# Patient Record
Sex: Male | Born: 1945 | Race: Black or African American | Hispanic: No | Marital: Married | Smoking: Former smoker
Health system: Southern US, Community
[De-identification: ages and names within clinical notes are randomized; demographics above are authoritative.]

## PROBLEM LIST (undated history)

## (undated) DIAGNOSIS — Z8711 Personal history of peptic ulcer disease: Secondary | ICD-10-CM

## (undated) DIAGNOSIS — R55 Syncope and collapse: Secondary | ICD-10-CM

## (undated) DIAGNOSIS — I639 Cerebral infarction, unspecified: Secondary | ICD-10-CM

## (undated) DIAGNOSIS — R7303 Prediabetes: Secondary | ICD-10-CM

## (undated) DIAGNOSIS — R011 Cardiac murmur, unspecified: Secondary | ICD-10-CM

## (undated) DIAGNOSIS — M109 Gout, unspecified: Secondary | ICD-10-CM

## (undated) DIAGNOSIS — R972 Elevated prostate specific antigen [PSA]: Secondary | ICD-10-CM

## (undated) DIAGNOSIS — R569 Unspecified convulsions: Secondary | ICD-10-CM

## (undated) DIAGNOSIS — I6529 Occlusion and stenosis of unspecified carotid artery: Secondary | ICD-10-CM

## (undated) DIAGNOSIS — E785 Hyperlipidemia, unspecified: Secondary | ICD-10-CM

## (undated) DIAGNOSIS — Z973 Presence of spectacles and contact lenses: Secondary | ICD-10-CM

## (undated) DIAGNOSIS — I10 Essential (primary) hypertension: Secondary | ICD-10-CM

## (undated) DIAGNOSIS — K219 Gastro-esophageal reflux disease without esophagitis: Secondary | ICD-10-CM

## (undated) DIAGNOSIS — W19XXXA Unspecified fall, initial encounter: Secondary | ICD-10-CM

## (undated) DIAGNOSIS — H9192 Unspecified hearing loss, left ear: Secondary | ICD-10-CM

## (undated) DIAGNOSIS — Z8719 Personal history of other diseases of the digestive system: Secondary | ICD-10-CM

## (undated) DIAGNOSIS — C61 Malignant neoplasm of prostate: Principal | ICD-10-CM

## (undated) HISTORY — PX: TONSILLECTOMY: SUR1361

## (undated) HISTORY — DX: Syncope and collapse: R55

## (undated) HISTORY — DX: Cerebral infarction, unspecified: I63.9

## (undated) HISTORY — DX: Unspecified convulsions: R56.9

## (undated) HISTORY — PX: WISDOM TOOTH EXTRACTION: SHX21

---

## 1989-03-10 HISTORY — PX: LUMBAR SPINE SURGERY: SHX701

## 1999-08-30 ENCOUNTER — Encounter: Admission: RE | Admit: 1999-08-30 | Discharge: 1999-08-30 | Payer: Self-pay | Admitting: *Deleted

## 1999-08-30 ENCOUNTER — Encounter: Payer: Self-pay | Admitting: *Deleted

## 2000-01-07 ENCOUNTER — Encounter: Admission: RE | Admit: 2000-01-07 | Discharge: 2000-01-07 | Payer: Self-pay | Admitting: Cardiology

## 2000-01-07 ENCOUNTER — Encounter: Payer: Self-pay | Admitting: Cardiology

## 2000-02-20 ENCOUNTER — Encounter: Admission: RE | Admit: 2000-02-20 | Discharge: 2000-02-20 | Payer: Self-pay | Admitting: Cardiology

## 2000-02-20 ENCOUNTER — Encounter: Payer: Self-pay | Admitting: Cardiology

## 2000-02-28 ENCOUNTER — Encounter: Payer: Self-pay | Admitting: *Deleted

## 2000-02-28 ENCOUNTER — Ambulatory Visit (HOSPITAL_COMMUNITY): Admission: RE | Admit: 2000-02-28 | Discharge: 2000-02-28 | Payer: Self-pay | Admitting: *Deleted

## 2001-04-14 ENCOUNTER — Encounter: Payer: Self-pay | Admitting: Cardiology

## 2001-04-14 ENCOUNTER — Encounter: Admission: RE | Admit: 2001-04-14 | Discharge: 2001-04-14 | Payer: Self-pay | Admitting: Cardiology

## 2001-05-10 ENCOUNTER — Encounter: Payer: Self-pay | Admitting: Cardiology

## 2001-05-10 ENCOUNTER — Ambulatory Visit (HOSPITAL_COMMUNITY): Admission: RE | Admit: 2001-05-10 | Discharge: 2001-05-10 | Payer: Self-pay | Admitting: Cardiology

## 2003-11-17 ENCOUNTER — Ambulatory Visit (HOSPITAL_COMMUNITY): Admission: RE | Admit: 2003-11-17 | Discharge: 2003-11-17 | Payer: Self-pay | Admitting: Cardiology

## 2003-12-07 ENCOUNTER — Ambulatory Visit (HOSPITAL_COMMUNITY): Admission: RE | Admit: 2003-12-07 | Discharge: 2003-12-07 | Payer: Self-pay | Admitting: Surgery

## 2003-12-13 ENCOUNTER — Ambulatory Visit (HOSPITAL_COMMUNITY): Admission: RE | Admit: 2003-12-13 | Discharge: 2003-12-13 | Payer: Self-pay | Admitting: Surgery

## 2004-03-19 ENCOUNTER — Ambulatory Visit (HOSPITAL_COMMUNITY): Admission: RE | Admit: 2004-03-19 | Discharge: 2004-03-19 | Payer: Self-pay | Admitting: Surgery

## 2007-07-05 ENCOUNTER — Encounter: Admission: RE | Admit: 2007-07-05 | Discharge: 2007-07-05 | Payer: Self-pay | Admitting: Cardiology

## 2007-07-09 ENCOUNTER — Encounter: Admission: RE | Admit: 2007-07-09 | Discharge: 2007-07-09 | Payer: Self-pay | Admitting: Cardiology

## 2009-06-04 ENCOUNTER — Encounter: Admission: RE | Admit: 2009-06-04 | Discharge: 2009-06-04 | Payer: Self-pay | Admitting: Orthopedic Surgery

## 2010-04-30 ENCOUNTER — Ambulatory Visit (HOSPITAL_COMMUNITY)
Admission: RE | Admit: 2010-04-30 | Discharge: 2010-04-30 | Disposition: A | Discharge status: Home / Self Care | Payer: Medicare Other | Source: Ambulatory Visit | Attending: Cardiology | Admitting: Cardiology

## 2010-04-30 DIAGNOSIS — I10 Essential (primary) hypertension: Secondary | ICD-10-CM | POA: Insufficient documentation

## 2010-04-30 DIAGNOSIS — I6529 Occlusion and stenosis of unspecified carotid artery: Secondary | ICD-10-CM | POA: Insufficient documentation

## 2010-04-30 DIAGNOSIS — R0989 Other specified symptoms and signs involving the circulatory and respiratory systems: Secondary | ICD-10-CM

## 2010-04-30 DIAGNOSIS — E119 Type 2 diabetes mellitus without complications: Secondary | ICD-10-CM | POA: Insufficient documentation

## 2011-06-19 ENCOUNTER — Other Ambulatory Visit: Payer: Self-pay | Admitting: Cardiology

## 2011-06-19 ENCOUNTER — Ambulatory Visit (HOSPITAL_COMMUNITY)
Admission: RE | Admit: 2011-06-19 | Discharge: 2011-06-19 | Disposition: A | Discharge status: Home / Self Care | Payer: Medicare Other | Source: Ambulatory Visit | Attending: Cardiology | Admitting: Cardiology

## 2011-06-19 ENCOUNTER — Ambulatory Visit
Admission: RE | Admit: 2011-06-19 | Discharge: 2011-06-19 | Disposition: A | Discharge status: Home / Self Care | Payer: Medicare Other | Source: Ambulatory Visit | Attending: Cardiology | Admitting: Cardiology

## 2011-06-19 DIAGNOSIS — M79609 Pain in unspecified limb: Secondary | ICD-10-CM

## 2011-06-19 DIAGNOSIS — M7989 Other specified soft tissue disorders: Secondary | ICD-10-CM

## 2011-06-19 NOTE — Progress Notes (Addendum)
VASCULAR LAB PRELIMINARY  PRELIMINARY  PRELIMINARY  PRELIMINARY  Right lower extremity venous duplex has been completed.    Preliminary report:  Right leg is negative for deep and superficial vein thrombosis.  There is a medium size Baker's cyst noted in popliteal fossa.  Report called to Dr. Shana Chute.   Vanna Scotland,  RVT 06/19/2011, 10:37 AM

## 2011-11-26 HISTORY — PX: PROSTATE BIOPSY: SHX241

## 2012-03-10 HISTORY — PX: KNEE ARTHROSCOPY W/ MENISCAL REPAIR: SHX1877

## 2012-06-22 ENCOUNTER — Other Ambulatory Visit: Payer: Self-pay | Admitting: Cardiology

## 2012-06-22 ENCOUNTER — Ambulatory Visit
Admission: RE | Admit: 2012-06-22 | Discharge: 2012-06-22 | Disposition: A | Discharge status: Home / Self Care | Payer: Medicare Other | Source: Ambulatory Visit | Attending: Cardiology | Admitting: Cardiology

## 2012-06-22 DIAGNOSIS — R42 Dizziness and giddiness: Secondary | ICD-10-CM

## 2012-12-08 ENCOUNTER — Other Ambulatory Visit: Payer: Self-pay | Admitting: Urology

## 2012-12-15 ENCOUNTER — Encounter (HOSPITAL_BASED_OUTPATIENT_CLINIC_OR_DEPARTMENT_OTHER): Payer: Self-pay | Admitting: *Deleted

## 2012-12-15 NOTE — Progress Notes (Signed)
NPO AFTER MN. ARRIVE AT 1610. NEEDS ISTAT AND EKG. WILL DO FLEET ENEMA AM DOS.

## 2012-12-15 NOTE — Progress Notes (Signed)
12/15/12 1207  OBSTRUCTIVE SLEEP APNEA  Have you ever been diagnosed with sleep apnea through a sleep study? No  Do you snore loudly (loud enough to be heard through closed doors)?  1  Do you often feel tired, fatigued, or sleepy during the daytime? 0  Has anyone observed you stop breathing during your sleep? 0  Do you have, or are you being treated for high blood pressure? 1  BMI more than 35 kg/m2? 0  Age over 69 years old? 1  Neck circumference greater than 40 cm/18 inches? 0  Gender: 1  Obstructive Sleep Apnea Score 4  Score 4 or greater  Results sent to PCP

## 2012-12-21 ENCOUNTER — Other Ambulatory Visit: Payer: Self-pay

## 2012-12-21 ENCOUNTER — Ambulatory Visit (HOSPITAL_BASED_OUTPATIENT_CLINIC_OR_DEPARTMENT_OTHER)
Admission: RE | Admit: 2012-12-21 | Discharge: 2012-12-21 | Disposition: A | Discharge status: Home / Self Care | Payer: Medicare Other | Source: Ambulatory Visit | Attending: Urology | Admitting: Urology

## 2012-12-21 ENCOUNTER — Encounter (HOSPITAL_BASED_OUTPATIENT_CLINIC_OR_DEPARTMENT_OTHER): Payer: Medicare Other | Admitting: Anesthesiology

## 2012-12-21 ENCOUNTER — Ambulatory Visit (HOSPITAL_BASED_OUTPATIENT_CLINIC_OR_DEPARTMENT_OTHER): Payer: Medicare Other | Admitting: Anesthesiology

## 2012-12-21 ENCOUNTER — Encounter (HOSPITAL_BASED_OUTPATIENT_CLINIC_OR_DEPARTMENT_OTHER): Payer: Self-pay | Admitting: Anesthesiology

## 2012-12-21 ENCOUNTER — Encounter (HOSPITAL_BASED_OUTPATIENT_CLINIC_OR_DEPARTMENT_OTHER)
Admission: RE | Disposition: A | Discharge status: Home / Self Care | Payer: Self-pay | Source: Ambulatory Visit | Attending: Urology

## 2012-12-21 DIAGNOSIS — C61 Malignant neoplasm of prostate: Secondary | ICD-10-CM

## 2012-12-21 DIAGNOSIS — I10 Essential (primary) hypertension: Secondary | ICD-10-CM | POA: Insufficient documentation

## 2012-12-21 DIAGNOSIS — M109 Gout, unspecified: Secondary | ICD-10-CM | POA: Insufficient documentation

## 2012-12-21 DIAGNOSIS — N529 Male erectile dysfunction, unspecified: Secondary | ICD-10-CM | POA: Insufficient documentation

## 2012-12-21 DIAGNOSIS — R011 Cardiac murmur, unspecified: Secondary | ICD-10-CM | POA: Insufficient documentation

## 2012-12-21 DIAGNOSIS — N139 Obstructive and reflux uropathy, unspecified: Secondary | ICD-10-CM | POA: Insufficient documentation

## 2012-12-21 DIAGNOSIS — E7800 Pure hypercholesterolemia, unspecified: Secondary | ICD-10-CM | POA: Insufficient documentation

## 2012-12-21 HISTORY — DX: Personal history of other diseases of the digestive system: Z87.19

## 2012-12-21 HISTORY — DX: Presence of spectacles and contact lenses: Z97.3

## 2012-12-21 HISTORY — DX: Occlusion and stenosis of unspecified carotid artery: I65.29

## 2012-12-21 HISTORY — DX: Cardiac murmur, unspecified: R01.1

## 2012-12-21 HISTORY — DX: Personal history of peptic ulcer disease: Z87.11

## 2012-12-21 HISTORY — DX: Essential (primary) hypertension: I10

## 2012-12-21 HISTORY — PX: PROSTATE BIOPSY: SHX241

## 2012-12-21 HISTORY — DX: Prediabetes: R73.03

## 2012-12-21 HISTORY — DX: Unspecified hearing loss, left ear: H91.92

## 2012-12-21 HISTORY — DX: Elevated prostate specific antigen (PSA): R97.20

## 2012-12-21 HISTORY — DX: Gout, unspecified: M10.9

## 2012-12-21 HISTORY — DX: Malignant neoplasm of prostate: C61

## 2012-12-21 SURGERY — BIOPSY, PROSTATE, RECTAL APPROACH, WITH US GUIDANCE
Anesthesia: General | Site: Prostate | Wound class: Clean Contaminated

## 2012-12-21 MED ORDER — PROPOFOL 10 MG/ML IV BOLUS
INTRAVENOUS | Status: DC | PRN
  Administered 2012-12-21: 200 mg via INTRAVENOUS

## 2012-12-21 MED ORDER — GLYCOPYRROLATE 0.2 MG/ML IJ SOLN
INTRAMUSCULAR | Status: DC | PRN
  Administered 2012-12-21: 0.2 mg via INTRAVENOUS

## 2012-12-21 MED ORDER — CIPROFLOXACIN IN D5W 400 MG/200ML IV SOLN
400.0000 mg | INTRAVENOUS | Status: AC
  Administered 2012-12-21: 400 mg via INTRAVENOUS
  Filled 2012-12-21: qty 200

## 2012-12-21 MED ORDER — PROMETHAZINE HCL 25 MG/ML IJ SOLN
6.2500 mg | INTRAMUSCULAR | Status: DC | PRN
  Filled 2012-12-21: qty 1

## 2012-12-21 MED ORDER — LIDOCAINE HCL (CARDIAC) 20 MG/ML IV SOLN
INTRAVENOUS | Status: DC | PRN
  Administered 2012-12-21: 60 mg via INTRAVENOUS

## 2012-12-21 MED ORDER — FENTANYL CITRATE 0.05 MG/ML IJ SOLN
INTRAMUSCULAR | Status: DC | PRN
  Administered 2012-12-21 (×2): 50 ug via INTRAVENOUS

## 2012-12-21 MED ORDER — LACTATED RINGERS IV SOLN
INTRAVENOUS | Status: DC
  Administered 2012-12-21: 09:00:00 via INTRAVENOUS
  Filled 2012-12-21: qty 1000

## 2012-12-21 MED ORDER — DEXAMETHASONE SODIUM PHOSPHATE 4 MG/ML IJ SOLN
INTRAMUSCULAR | Status: DC | PRN
  Administered 2012-12-21: 10 mg via INTRAVENOUS

## 2012-12-21 MED ORDER — FENTANYL CITRATE 0.05 MG/ML IJ SOLN
25.0000 ug | INTRAMUSCULAR | Status: DC | PRN
  Filled 2012-12-21: qty 1

## 2012-12-21 MED ORDER — ONDANSETRON HCL 4 MG/2ML IJ SOLN
INTRAMUSCULAR | Status: DC | PRN
  Administered 2012-12-21: 4 mg via INTRAMUSCULAR

## 2012-12-21 SURGICAL SUPPLY — 1 items: UNDERPAD 30X30 INCONTINENT (UNDERPADS AND DIAPERS) ×2 IMPLANT

## 2012-12-21 NOTE — Anesthesia Preprocedure Evaluation (Addendum)
Anesthesia Evaluation  Patient identified by MRN, date of birth, ID band Patient awake    Reviewed: Allergy & Precautions, H&P , NPO status , Patient's Chart, lab work & pertinent test results  Airway Mallampati: II TM Distance: >3 FB Neck ROM: Full    Dental no notable dental hx.    Pulmonary neg pulmonary ROS,  breath sounds clear to auscultation  Pulmonary exam normal       Cardiovascular Exercise Tolerance: Good hypertension, Pt. on medications + Peripheral Vascular Disease negative cardio ROS  + Valvular Problems/Murmurs Rhythm:Regular Rate:Normal  ECG reviewed. ?LVH   Neuro/Psych negative neurological ROS  negative psych ROS   GI/Hepatic negative GI ROS, Neg liver ROS,   Endo/Other  Borderline Diabetes, diet controlled.  Renal/GU negative Renal ROS  negative genitourinary   Musculoskeletal negative musculoskeletal ROS (+)   Abdominal   Peds negative pediatric ROS (+)  Hematology negative hematology ROS (+)   Anesthesia Other Findings   Reproductive/Obstetrics negative OB ROS                          Anesthesia Physical Anesthesia Plan  ASA: II  Anesthesia Plan: General   Post-op Pain Management:    Induction: Intravenous  Airway Management Planned: LMA  Additional Equipment:   Intra-op Plan:   Post-operative Plan: Extubation in OR  Informed Consent: I have reviewed the patients History and Physical, chart, labs and discussed the procedure including the risks, benefits and alternatives for the proposed anesthesia with the patient or authorized representative who has indicated his/her understanding and acceptance.   Dental advisory given  Plan Discussed with: CRNA  Anesthesia Plan Comments:         Anesthesia Quick Evaluation

## 2012-12-21 NOTE — Anesthesia Procedure Notes (Signed)
Procedure Name: LMA Insertion Date/Time: 12/21/2012 10:06 AM Performed by: Norva Pavlov Pre-anesthesia Checklist: Patient identified, Emergency Drugs available, Suction available and Patient being monitored Patient Re-evaluated:Patient Re-evaluated prior to inductionOxygen Delivery Method: Circle System Utilized Preoxygenation: Pre-oxygenation with 100% oxygen Intubation Type: IV induction Ventilation: Mask ventilation without difficulty LMA: LMA inserted LMA Size: 4.0 Number of attempts: 1 Airway Equipment and Method: bite block Placement Confirmation: positive ETCO2 Tube secured with: Tape Dental Injury: Teeth and Oropharynx as per pre-operative assessment

## 2012-12-21 NOTE — Transfer of Care (Signed)
Immediate Anesthesia Transfer of Care Note  Patient: Ricky SCHLEYER Sr.  Procedure(s) Performed: Procedure(s) (LRB): BIOPSY TRANSRECTAL ULTRASONIC PROSTATE (TUBP) (N/A)  Patient Location: PACU  Anesthesia Type: General  Level of Consciousness: awake, alert  and oriented  Airway & Oxygen Therapy: Patient Spontanous Breathing and Patient connected to face mask oxygen  Post-op Assessment: Report given to PACU RN and Post -op Vital signs reviewed and stable  Post vital signs: Reviewed and stable  Complications: No apparent anesthesia complications

## 2012-12-21 NOTE — Anesthesia Postprocedure Evaluation (Signed)
  Anesthesia Post-op Note  Patient: Ricky Rowland.  Procedure(s) Performed: Procedure(s) (LRB): BIOPSY TRANSRECTAL ULTRASONIC PROSTATE (TUBP) (N/A)  Patient Location: PACU  Anesthesia Type: General  Level of Consciousness: awake and alert   Airway and Oxygen Therapy: Patient Spontanous Breathing  Post-op Pain: mild  Post-op Assessment: Post-op Vital signs reviewed, Patient's Cardiovascular Status Stable, Respiratory Function Stable, Patent Airway and No signs of Nausea or vomiting  Last Vitals:  Filed Vitals:   12/21/12 1100  BP: 139/81  Pulse: 64  Temp:   Resp: 13    Post-op Vital Signs: stable   Complications: No apparent anesthesia complications

## 2012-12-21 NOTE — H&P (Signed)
History of Present Illness  Mr Ricky Rowland had a negative prostate biopsy in September 2013 for PSA of 4.79.  His PSA is trending up and is now 9.05.  He needs prostate biopsy.  He has nocturia x 1.  He has difficulty with erections and has not had any response with Viagra.  He does not have success with the vacuum either.  I discussed the other treatment options with him: penile injections versus penile prosthesis.  He would like to think about it.  I gave him literature about penile prosthesis for his review.   Past Medical History Problems  1. History of  Arthritis V13.4 2. History of  Gout 274.9 3. History of  Hypercholesterolemia 272.0 4. History of  Hypertension 401.9 5. History of  Murmurs 785.2  Surgical History Problems  1. History of  Back Surgery  Current Meds 1. Allopurinol 300 MG Oral Tablet; Therapy: 18Jan2013 to 2. Clobetasol Propionate 0.05 % External Solution; Therapy: 14May2014 to 3. Colcrys 0.6 MG Oral Tablet; Therapy: 24Apr2014 to 4. Exforge HCT 10-320-25 MG Oral Tablet; Therapy: 05Oct2012 to 5. Tekturna 300 MG Oral Tablet; Therapy: 04Oct2012 to 6. Viagra 100 MG Oral Tablet; UAD - USE AS DIRECTED; Therapy: 26Mar2014 to  (Evaluate:17Aug2014); Last Rx:26Mar2014  Allergies Medication  1. No Known Drug Allergies  Family History Problems  1. Fraternal history of  Diabetes Mellitus V18.0 2. Family history of  Family Health Status Number Of Children 3 children 3. Sororal history of  Heart Disease V17.49 4. Family history of  Prostate Cancer V16.42 5. Fraternal history of  Prostate Cancer V16.42  Social History Problems  1. Alcohol Use 2-3 qd 2. Caffeine Use 2 qd 3. Fraternal history of  Death In The Family Father 4. Fraternal history of  Death In The Family Mother 5. Marital History - Currently Married 6. Tobacco Use V15.82 1 ppd for 3yrs, nonsmoker for the past 51yrs  Review of Systems Genitourinary, constitutional, skin, eye, otolaryngeal,  hematologic/lymphatic, cardiovascular, pulmonary, endocrine, musculoskeletal, gastrointestinal, neurological and psychiatric system(s) were reviewed and pertinent findings if present are noted.    Vitals Vital Signs [Data Includes: Last 1 Day]  29Sep2014 01:48PM  Blood Pressure: 173 / 82 Heart Rate: 57 Respiration: 18  Physical Exam Constitutional: Well nourished and well developed . No acute distress.  ENT:. The ears and nose are normal in appearance.  Neck: The appearance of the neck is normal and no neck mass is present.  Pulmonary: No respiratory distress and normal respiratory rhythm and effort.  Cardiovascular: Heart rate and rhythm are normal . No peripheral edema.  Abdomen: The abdomen is soft and nontender. No masses are palpated. No CVA tenderness. No hernias are palpable. No hepatosplenomegaly noted.  Genitourinary: Examination of the penis demonstrates no discharge, no masses, no lesions and a normal meatus. The scrotum is without lesions. The right epididymis is palpably normal and non-tender. The left epididymis is palpably normal and non-tender. The right testis is non-tender and without masses. The left testis is non-tender and without masses.  Lymphatics: The femoral and inguinal nodes are not enlarged or tender.  Skin: Normal skin turgor, no visible rash and no visible skin lesions.  Neuro/Psych:. Mood and affect are appropriate.    Results/Data Urine [Data Includes: Last 1 Day]   29Sep2014  COLOR YELLOW   APPEARANCE CLEAR   SPECIFIC GRAVITY 1.025   pH 6.0   GLUCOSE NEG mg/dL  BILIRUBIN NEG   KETONE NEG mg/dL  BLOOD NEG   PROTEIN 161 mg/dL  UROBILINOGEN 0.2 mg/dL  NITRITE NEG   LEUKOCYTE ESTERASE NEG   SQUAMOUS EPITHELIAL/HPF FEW   WBC NONE SEEN WBC/hpf  RBC 0-2 RBC/hpf  BACTERIA NONE SEEN   CRYSTALS NONE SEEN   CASTS NONE SEEN    Assessment Assessed  1. PSA,Elevated 790.93 2. 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 Benign Prostatic  Hyperplasia Localized With Urinary Obstruction With Other Lower Urinary Tract  Symptoms 600.21  Plan Health Maintenance (V70.0)  1. UA With REFLEX  Done: 29Sep2014 01:29PM   Repeat prostate biopsy. He would like to have it under anesthesia.  ill schedule for the procedure.  The risks of the procedure include but are not limited to hemorrhage: blood in the urine, stools or semen, infection,sepsis. He understands and wishes to proceed.

## 2012-12-21 NOTE — Op Note (Signed)
ANDRAY ASSEFA Sr. is a 67 y.o.   12/21/2012  General  Pre-op diagnosis: Elevated PSA. Rule out prostate cancer  Postop diagnosis: Same  Procedure done: Ultrasound prostate biopsy  Surgeon: Wendie Simmer. Hillarie Harrigan  Ultrasound technologist: Dorcas Mcmurray  Indication: Patient is a 67 years old male who had a negative prostate biopsy in September 2013 for PSA of 4.79. PSA has been trending up and is now 9.05. He needs a repeat prostate biopsy. He prefers to have it under anesthesia. He is scheduled today for the procedure  Procedure: The patient was identified by his wrist band and proper timeout was taken.  Under general anesthesia he was prepped and draped and placed in the left lateral decubitus position. The transducer was inserted in the rectum. Ultrasound of the prostate was done. The prostate volume is  38.99 cc. The prostate height is 3.21 cm. The width is 5.32 cm and the left is 4.36 cm. There is no evidence of hypoechoic nodules.  Then 2 biopsies of the right base were done. 2 biopsies of the right mid gland and 2 biopsies of the right apex were done. Then 2 biopsies of the left base, 2 biopsies of the left mid gland and 2 biopsies of the left apex were done. There was no evidence of bleeding at the end of the procedure. The transducer was removed.  EBL: Minimal  The patient tolerated the procedure well and left the OR in satisfactory condition to postanesthesia care unit  CC: Dr. Donia Guiles

## 2012-12-22 ENCOUNTER — Encounter (HOSPITAL_BASED_OUTPATIENT_CLINIC_OR_DEPARTMENT_OTHER): Payer: Self-pay | Admitting: Urology

## 2012-12-22 LAB — POCT I-STAT, CHEM 8
Calcium, Ion: 1.16 mmol/L (ref 1.13–1.30)
Chloride: 105 mEq/L (ref 96–112)
Glucose, Bld: 120 mg/dL — ABNORMAL HIGH (ref 70–99)
HCT: 43 % (ref 39.0–52.0)
Potassium: 3.8 mEq/L (ref 3.5–5.1)
Sodium: 139 mEq/L (ref 135–145)
TCO2: 26 mmol/L (ref 0–100)

## 2012-12-23 ENCOUNTER — Other Ambulatory Visit: Payer: Self-pay

## 2012-12-23 ENCOUNTER — Encounter (HOSPITAL_COMMUNITY): Payer: Self-pay | Admitting: Emergency Medicine

## 2012-12-23 ENCOUNTER — Emergency Department (HOSPITAL_COMMUNITY)
Admission: EM | Admit: 2012-12-23 | Discharge: 2012-12-23 | Disposition: A | Discharge status: Home / Self Care | Payer: Medicare Other | Attending: Emergency Medicine | Admitting: Emergency Medicine

## 2012-12-23 DIAGNOSIS — I10 Essential (primary) hypertension: Secondary | ICD-10-CM | POA: Insufficient documentation

## 2012-12-23 DIAGNOSIS — R011 Cardiac murmur, unspecified: Secondary | ICD-10-CM | POA: Insufficient documentation

## 2012-12-23 DIAGNOSIS — Z8719 Personal history of other diseases of the digestive system: Secondary | ICD-10-CM | POA: Insufficient documentation

## 2012-12-23 DIAGNOSIS — R55 Syncope and collapse: Secondary | ICD-10-CM

## 2012-12-23 DIAGNOSIS — R110 Nausea: Secondary | ICD-10-CM

## 2012-12-23 DIAGNOSIS — Z87891 Personal history of nicotine dependence: Secondary | ICD-10-CM | POA: Insufficient documentation

## 2012-12-23 DIAGNOSIS — Z792 Long term (current) use of antibiotics: Secondary | ICD-10-CM | POA: Insufficient documentation

## 2012-12-23 DIAGNOSIS — H9190 Unspecified hearing loss, unspecified ear: Secondary | ICD-10-CM | POA: Insufficient documentation

## 2012-12-23 DIAGNOSIS — R112 Nausea with vomiting, unspecified: Secondary | ICD-10-CM | POA: Insufficient documentation

## 2012-12-23 DIAGNOSIS — M109 Gout, unspecified: Secondary | ICD-10-CM | POA: Insufficient documentation

## 2012-12-23 LAB — POCT I-STAT TROPONIN I: Troponin i, poc: 0.02 ng/mL (ref 0.00–0.08)

## 2012-12-23 LAB — CBC
HCT: 38.7 % — ABNORMAL LOW (ref 39.0–52.0)
Hemoglobin: 13.2 g/dL (ref 13.0–17.0)
MCH: 30.1 pg (ref 26.0–34.0)
MCHC: 34.1 g/dL (ref 30.0–36.0)
MCV: 88.4 fL (ref 78.0–100.0)
Platelets: 154 10*3/uL (ref 150–400)
RBC: 4.38 MIL/uL (ref 4.22–5.81)
RDW: 14.1 % (ref 11.5–15.5)
WBC: 13.6 10*3/uL — ABNORMAL HIGH (ref 4.0–10.5)

## 2012-12-23 LAB — BASIC METABOLIC PANEL
BUN: 21 mg/dL (ref 6–23)
CO2: 23 mEq/L (ref 19–32)
Chloride: 102 mEq/L (ref 96–112)
GFR calc non Af Amer: 67 mL/min — ABNORMAL LOW (ref >90)
Glucose, Bld: 103 mg/dL — ABNORMAL HIGH (ref 70–99)
Potassium: 3.9 mEq/L (ref 3.5–5.1)
Sodium: 137 mEq/L (ref 135–145)

## 2012-12-23 LAB — BASIC METABOLIC PANEL WITH GFR
Calcium: 8.6 mg/dL (ref 8.4–10.5)
Creatinine, Ser: 1.09 mg/dL (ref 0.50–1.35)
GFR calc Af Amer: 78 mL/min — ABNORMAL LOW (ref >90)

## 2012-12-23 MED ORDER — ONDANSETRON HCL 4 MG/2ML IJ SOLN: 4.0000 mg | Freq: Once | INTRAMUSCULAR | Status: DC

## 2012-12-23 MED ORDER — SODIUM CHLORIDE 0.9 % IV BOLUS (SEPSIS)
1000.0000 mL | Freq: Once | INTRAVENOUS | Status: AC
  Administered 2012-12-23: 1000 mL via INTRAVENOUS

## 2012-12-23 MED ORDER — ONDANSETRON 8 MG PO TBDP: 8.0000 mg | ORAL_TABLET | Freq: Two times a day (BID) | ORAL | Status: DC | PRN

## 2012-12-23 NOTE — ED Notes (Signed)
Per EMS Pt had sudden onset of dizziness, diaphoresis, and nausea at approximately 1120. Pt had one episode of vomiting with EMS. Denies CP, SOB or LOC. Negative for stroke symptoms. EMS gave 4mg  IV zofran. Pt had prostate biopsy two days ago. Pt reports having dizzy episodes like this before but not as severe.

## 2012-12-23 NOTE — Discharge Instructions (Signed)
 Near-Syncope Near-syncope is sudden weakness, dizziness, or feeling like you might pass out (faint). This may occur when getting up after sitting or while standing for a long period of time. Near-syncope can be caused by a drop in blood pressure. This is a common reaction, but it may occur to a greater degree in people taking medicines to control their blood pressure. Fainting often occurs when the blood pressure or pulse is too low to provide enough blood flow to the brain to keep you conscious. Fainting and near-syncope are not usually due to serious medical problems. However, certain people should be more cautious in the event of near-syncope, including elderly patients, patients with diabetes, and patients with a history of heart conditions (especially irregular rhythms).  CAUSES   Drop in blood pressure.  Physical pain.  Dehydration.  Heat exhaustion.  Emotional distress.  Low blood sugar.  Internal bleeding.  Heart and circulatory problems.  Infections. SYMPTOMS   Dizziness.  Feeling sick to your stomach (nauseous).  Nearly fainting.  Body numbness.  Turning pale.  Tunnel vision.  Weakness. HOME CARE INSTRUCTIONS   Lie down right away if you start feeling like you might faint. Breathe deeply and steadily. Wait until all the symptoms have passed. Most of these episodes last only a few minutes. You may feel tired for several hours.  Drink enough fluids to keep your urine clear or pale yellow.  If you are taking blood pressure or heart medicine, get up slowly, taking several minutes to sit and then stand. This can reduce dizziness that is caused by a drop in blood pressure. SEEK IMMEDIATE MEDICAL CARE IF:   You have a severe headache.  Unusual pain develops in the chest, abdomen, or back.  There is bleeding from the mouth or rectum, or you have black or tarry stool.  An irregular heartbeat or a very rapid pulse develops.  You have repeated fainting or  seizure-like jerking during an episode.  You faint when sitting or lying down.  You develop confusion.  You have difficulty walking.  Severe weakness develops.  Vision problems develop. MAKE SURE YOU:   Understand these instructions.  Will watch your condition.  Will get help right away if you are not doing well or get worse. Document Released: 02/24/2005 Document Revised: 05/19/2011 Document Reviewed: 04/12/2010 [facility] Patient Information 2014 [place], [place].    Nausea, Adult Nausea is the feeling that you have an upset stomach or have to vomit. Nausea by itself is not likely a serious concern, but it may be an early sign of more serious medical problems. As nausea gets worse, it can lead to vomiting. If vomiting develops, there is the risk of dehydration.  CAUSES   Viral infections.  Food poisoning.  Medicines.  Pregnancy.  Motion sickness.  Migraine headaches.  Emotional distress.  Severe pain from any source.  Alcohol intoxication. HOME CARE INSTRUCTIONS  Get plenty of rest.  Ask your caregiver about specific rehydration instructions.  Eat small amounts of food and sip liquids more often.  Take all medicines as told by your caregiver. SEEK MEDICAL CARE IF:  You have not improved after 2 days, or you get worse.  You have a headache. SEEK IMMEDIATE MEDICAL CARE IF:   You have a fever.  You faint.  You keep vomiting or have blood in your vomit.  You are extremely weak or dehydrated.  You have dark or bloody stools.  You have severe chest or abdominal pain. MAKE SURE YOU:  Understand  these instructions.  Will watch your condition.  Will get help right away if you are not doing well or get worse. Document Released: 04/03/2004 Document Revised: 11/19/2011 Document Reviewed: 11/06/2010 [facility] Patient Information 2014 [place], [place].

## 2012-12-23 NOTE — ED Notes (Signed)
MD at bedside. 

## 2012-12-23 NOTE — ED Provider Notes (Signed)
CSN: [id number]     Arrival date & time 12/23/12  1230 History   First MD Initiated Contact with Patient 12/23/12 1248     Chief Complaint  Patient presents with  . Dizziness  . Emesis   (Consider location/radiation/quality/duration/timing/severity/associated sxs/prior Treatment) HPI Comments: Pt was driving and began to quickly become light headed, sweaty, felt faint, dizzy and had emesis.  Pt has had similar symptoms in the past per family, pt usually can rest, sometimes he throws up, sometimes has a loose BM and symptoms improve.  Pt denies HA, visual loss, focal numbness or weakness, neck pain, CP, back pain, abd pain.  Pt reports his symptoms are still present but improved, no longer feels faint. Pt skipped breakfast but that is normal for him.  He was sedated and had a prostate biopsy done 2 days ago.  He laid around yesterday, rested, but was more active today, slept well in his estimation.  No meds taken PTA.  No h/o TIA or stroke, MI or CAD.    The history is provided by the patient and the spouse.    Past Medical History  Diagnosis Date  . Elevated PSA   . Hypertension   . External carotid artery stenosis     MILD-- BILATERAL  . Borderline diabetes     DIET CONTROLLED  . Gout, arthritis   . History of gastric ulcer     REMOTE HX  . Heart murmur     MILD --  ASYMPTOMATIC  . Hearing loss in left ear   . Wears glasses    Past Surgical History  Procedure Laterality Date  . Knee arthroscopy w/ meniscal repair Left JAN 2014  . Lumbar spine surgery  1991  . Prostate biopsy N/A 12/21/2012    Procedure: BIOPSY TRANSRECTAL ULTRASONIC PROSTATE (TUBP);  Surgeon: Lindaann Slough, MD;  Location: [facility];  Service: Urology;  Laterality: N/A;   History reviewed. No pertinent family history. History  Substance Use Topics  . Smoking status: Former Smoker -- 1.00 packs/day for 30 years    Types: Cigarettes    Quit date: 12/15/1992  . Smokeless tobacco: Never Used   . Alcohol Use: Yes     Comment: OCCASIONAL    Review of Systems  Respiratory: Negative for cough, chest tightness and shortness of breath.   Cardiovascular: Negative for chest pain, palpitations and leg swelling.  Gastrointestinal: Positive for nausea and vomiting. Negative for abdominal pain.  Musculoskeletal: Negative for arthralgias, myalgias, neck pain and neck stiffness.  Neurological: Positive for dizziness and light-headedness.  All other systems reviewed and are negative.    Allergies  Review of patient's allergies indicates no known allergies.  Home Medications   Current Outpatient Rx  Name  Route  Sig  Dispense  Refill  . acetaminophen (TYLENOL) 500 MG tablet   Oral   Take 500 mg by mouth every 6 (six) hours as needed for pain.         Marland Kitchen aliskiren (TEKTURNA) 300 MG tablet   Oral   Take 300 mg by mouth every evening.         Marland Kitchen allopurinol (ZYLOPRIM) 300 MG tablet   Oral   Take 300 mg by mouth every evening.         Marland Kitchen amLODipine-valsartan (EXFORGE) 5-320 MG per tablet   Oral   Take 1 tablet by mouth every evening.         . clobetasol cream (TEMOVATE) 0.05 %   Topical  Apply topically as needed.         . colchicine 0.6 MG tablet   Oral   Take 0.6 mg by mouth as needed.         . tobramycin-dexamethasone (TOBRADEX) ophthalmic solution   Both Eyes   Place 1 drop into both eyes 4 (four) times daily.         Marland Kitchen levofloxacin (LEVAQUIN) 500 MG tablet   Oral   Take 500 mg by mouth daily.         . ondansetron (ZOFRAN-ODT) 8 MG disintegrating tablet   Oral   Take 1 tablet (8 mg total) by mouth every 12 (twelve) hours as needed for nausea.   20 tablet   0    BP 146/90  Pulse 60  Temp(Src) 98.1 F (36.7 C) (Oral)  Resp 21  SpO2 98% Physical Exam  Nursing note and vitals reviewed. Constitutional: He is oriented to person, place, and time. He appears well-developed and well-nourished. No distress.  HENT:  Head: Normocephalic and  atraumatic.  Eyes: Conjunctivae and EOM are normal. No scleral icterus.  Neck: Neck supple. No JVD present.  Cardiovascular: Normal rate, regular rhythm and intact distal pulses.   No murmur heard. Pulmonary/Chest: Effort normal. No respiratory distress. He has no wheezes. He has no rales.  Abdominal: Soft. He exhibits no distension. There is no tenderness. There is no rebound and no guarding.  Musculoskeletal: He exhibits no edema and no tenderness.  Neurological: He is alert and oriented to person, place, and time. No cranial nerve deficit. He exhibits normal muscle tone. Coordination normal.  Skin: Skin is warm. He is not diaphoretic.  Psychiatric: He has a normal mood and affect.    ED Course  Procedures (including critical care time) Labs Review Labs Reviewed  CBC - Abnormal; Notable for the following:    WBC 13.6 (*)    HCT 38.7 (*)    All other components within normal limits  BASIC METABOLIC PANEL - Abnormal; Notable for the following:    Glucose, Bld 103 (*)    GFR calc non Af Amer 67 (*)    GFR calc Af Amer 78 (*)    All other components within normal limits  POCT I-STAT TROPONIN I   Imaging Review No results found.  EKG Interpretation   None      RA sat is 98% and I interpret to be normal  ECG at time 13:02 shows SR at rate 57, LAD, incomplete RBBB, no ST or T wave abn's.  Normal intervals otherwise.  Borderline ECG.  No priors.  4:55 PM Pt fels improved and resolved of nausea, no longer dizzy.  Has ambualted in the halls with no recurrene of symptoms.  Troponin is normal, no ischemia on ECG.  Pt and family ok with going home, return precautions discussed, I recommended close follow up with Dr. Shana Chute next week  MDM   1. Near syncope   2. Nausea      Pt with history consistent with near syncope associated with lightheadedness and vagal episode.  Pt remains light headed and dizzy, but symptoms are not consistent with vertigo.  Pt has a non focal neuro exam  currently.  Will monitor, check ECG, labs, troponin serially and give IVF bolus.      Gavin Pound. Kolson Chovanec, MD 12/23/12 1655

## 2012-12-23 NOTE — ED Notes (Signed)
Ambulated PT in hall way. Pt ambulated well and displayed no obvious difficulties during ambulation.

## 2012-12-27 ENCOUNTER — Other Ambulatory Visit: Payer: Self-pay | Admitting: Cardiology

## 2012-12-27 ENCOUNTER — Other Ambulatory Visit (HOSPITAL_COMMUNITY): Payer: Self-pay | Admitting: Cardiology

## 2012-12-27 DIAGNOSIS — R55 Syncope and collapse: Secondary | ICD-10-CM

## 2012-12-27 DIAGNOSIS — R112 Nausea with vomiting, unspecified: Secondary | ICD-10-CM

## 2012-12-30 ENCOUNTER — Other Ambulatory Visit: Payer: Self-pay | Admitting: Cardiology

## 2012-12-30 ENCOUNTER — Ambulatory Visit
Admission: RE | Admit: 2012-12-30 | Discharge: 2012-12-30 | Disposition: A | Discharge status: Home / Self Care | Payer: 59 | Source: Ambulatory Visit | Attending: Cardiology | Admitting: Cardiology

## 2012-12-30 DIAGNOSIS — R112 Nausea with vomiting, unspecified: Secondary | ICD-10-CM

## 2013-01-03 ENCOUNTER — Encounter (HOSPITAL_COMMUNITY): Payer: Medicare Other

## 2013-01-10 ENCOUNTER — Encounter (HOSPITAL_COMMUNITY)
Admission: RE | Admit: 2013-01-10 | Discharge: 2013-01-10 | Disposition: A | Discharge status: Home / Self Care | Payer: Medicare Other | Source: Ambulatory Visit | Attending: Cardiology | Admitting: Cardiology

## 2013-01-10 ENCOUNTER — Other Ambulatory Visit: Payer: Self-pay

## 2013-01-10 DIAGNOSIS — R55 Syncope and collapse: Secondary | ICD-10-CM

## 2013-01-10 MED ORDER — TECHNETIUM TC 99M SESTAMIBI GENERIC - CARDIOLITE
30.0000 | Freq: Once | INTRAVENOUS | Status: AC | PRN
  Administered 2013-01-10: 30 via INTRAVENOUS

## 2013-01-10 MED ORDER — TECHNETIUM TC 99M SESTAMIBI GENERIC - CARDIOLITE
10.0000 | Freq: Once | INTRAVENOUS | Status: AC | PRN
  Administered 2013-01-10: 10 via INTRAVENOUS

## 2013-02-25 ENCOUNTER — Telehealth: Payer: Self-pay | Admitting: Internal Medicine

## 2013-02-25 NOTE — Telephone Encounter (Signed)
S/W PT AND GVE NP APPT 01/05 @ 11 W/DR. MOHAMED REFERRING DR. Kevin Fenton SPRUILL DX- EVALUATE FREE KAPPA CHAIN IN URINE/PROTIENURIA WELCOME PACKET MAILED.

## 2013-02-25 NOTE — Telephone Encounter (Signed)
PATIENT REQUESTED AN APPT AFTER THE NEW YEAR

## 2013-02-25 NOTE — Telephone Encounter (Signed)
C/D 02/25/13 for appt. 03/14/13 °

## 2013-03-14 ENCOUNTER — Other Ambulatory Visit: Payer: Self-pay | Admitting: Internal Medicine

## 2013-03-14 ENCOUNTER — Telehealth: Payer: Self-pay | Admitting: Internal Medicine

## 2013-03-14 ENCOUNTER — Encounter (INDEPENDENT_AMBULATORY_CARE_PROVIDER_SITE_OTHER): Payer: Self-pay

## 2013-03-14 ENCOUNTER — Encounter: Payer: Self-pay | Admitting: Internal Medicine

## 2013-03-14 ENCOUNTER — Ambulatory Visit: Payer: Medicare Other

## 2013-03-14 ENCOUNTER — Ambulatory Visit (HOSPITAL_BASED_OUTPATIENT_CLINIC_OR_DEPARTMENT_OTHER): Payer: 59 | Admitting: Internal Medicine

## 2013-03-14 ENCOUNTER — Other Ambulatory Visit (HOSPITAL_BASED_OUTPATIENT_CLINIC_OR_DEPARTMENT_OTHER): Payer: 59

## 2013-03-14 VITALS — BP 156/79 | HR 75 | Temp 97.5°F | Resp 18 | Ht 75.0 in | Wt 235.1 lb

## 2013-03-14 DIAGNOSIS — D472 Monoclonal gammopathy: Secondary | ICD-10-CM

## 2013-03-14 DIAGNOSIS — I10 Essential (primary) hypertension: Secondary | ICD-10-CM

## 2013-03-14 DIAGNOSIS — C61 Malignant neoplasm of prostate: Secondary | ICD-10-CM

## 2013-03-14 LAB — COMPREHENSIVE METABOLIC PANEL (CC13)
ALK PHOS: 95 U/L (ref 40–150)
ALT: 31 U/L (ref 0–55)
ANION GAP: 12 meq/L — AB (ref 3–11)
AST: 28 U/L (ref 5–34)
Albumin: 3.8 g/dL (ref 3.5–5.0)
BUN: 19.8 mg/dL (ref 7.0–26.0)
CO2: 21 mEq/L — ABNORMAL LOW (ref 22–29)
CREATININE: 1.2 mg/dL (ref 0.7–1.3)
Calcium: 9.2 mg/dL (ref 8.4–10.4)
Chloride: 107 mEq/L (ref 98–109)
Glucose: 146 mg/dl — ABNORMAL HIGH (ref 70–140)
Potassium: 4 mEq/L (ref 3.5–5.1)
SODIUM: 139 meq/L (ref 136–145)
TOTAL PROTEIN: 7.5 g/dL (ref 6.4–8.3)
Total Bilirubin: 0.41 mg/dL (ref 0.20–1.20)

## 2013-03-14 LAB — CBC WITH DIFFERENTIAL/PLATELET
BASO%: 0.6 % (ref 0.0–2.0)
Basophils Absolute: 0 10*3/uL (ref 0.0–0.1)
EOS%: 1.7 % (ref 0.0–7.0)
Eosinophils Absolute: 0.1 10*3/uL (ref 0.0–0.5)
HEMATOCRIT: 39.9 % (ref 38.4–49.9)
HGB: 13.2 g/dL (ref 13.0–17.1)
LYMPH%: 30.7 % (ref 14.0–49.0)
MCH: 29.4 pg (ref 27.2–33.4)
MCHC: 33 g/dL (ref 32.0–36.0)
MCV: 89 fL (ref 79.3–98.0)
MONO#: 0.5 10*3/uL (ref 0.1–0.9)
MONO%: 7.9 % (ref 0.0–14.0)
NEUT#: 3.7 10*3/uL (ref 1.5–6.5)
NEUT%: 59.1 % (ref 39.0–75.0)
PLATELETS: 155 10*3/uL (ref 140–400)
RBC: 4.49 10*6/uL (ref 4.20–5.82)
RDW: 14.3 % (ref 11.0–14.6)
WBC: 6.3 10*3/uL (ref 4.0–10.3)
lymph#: 1.9 10*3/uL (ref 0.9–3.3)

## 2013-03-14 LAB — LACTATE DEHYDROGENASE (CC13): LDH: 239 U/L (ref 125–245)

## 2013-03-14 NOTE — Progress Notes (Signed)
[place] Telephone:([phone]   [phone]) [phone]  CONSULT NOTE  REFERRING PHYSICIAN: Dr. Awanda Mink Rowland  REASON FOR CONSULTATION:  68 years old African American male with elevated urine free kappa light chain.  HPI Ricky Rowland. is a 68 y.o. male was past medical history significant for multiple medical problems including hypertension, dyslipidemia, gout, arthritis, heart murmur, history of back surgery, borderline diabetes mellitus as well as recently diagnosed prostate adenocarcinoma followed by Dr. Janice Rowland and has an appointment with Dr. Valere Rowland on 03/24/2013 for discussion of radiotherapy treatment. His prostate biopsies of the left mid medial showed microscopic focus of prostate adenocarcinoma with Gleason score of 3+4 equal 7 involving less than 2% 1 of 1 core, the second needle biopsy of the left apex lateral also showed microscopic focus of prostate adenocarcinoma, Gleason score of 3+3 equal 6 involving less than 2% of the tissues.  The patient was seen recently by Ricky Rowland for routine physical exam and urinalysis showed elevated urine protein. This was followed by 24 hour urine protein electrophoreses. It showed elevated urine free kappa light chain 2.64 (normal 0.14-2.42). The urine immunofixation showed no monoclonal free light chains (Bence-Jones protein are detected but it showed a polyclonal increase in the free kappa and/or free lambda light chains. Because of these abnormalities Ricky Rowland kindly referred the patient to me today for further evaluation and recommendation regarding his condition.  When seen today the patient is feeling fine with no specific complaints except for a sore spot in his throat of the recent flulike symptoms. He also has occasional scrotal pain but was seen recently by Dr. Janice Rowland. The patient denied having any significant weight loss, night sweats, nausea or vomiting. He denied having any significant chest pain, shortness breath  but continues to have mild dry cough with no hemoptysis. He has no headache or visual changes. His family history significant for mother and father with hypertension, diabetes mellitus and heart disease. He has a brother who had throat cancer at age 43 and sister with colon cancer at age 49. The patient is married and has 3 children. 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 He has a history of smoking but quit 20 years ago. He drinks 2-3 alcoholic drinks every weeks. He has no history of drug abuse.   HPI  Past Medical History  Diagnosis Date  . Elevated PSA   . Hypertension   . External carotid artery stenosis     MILD-- BILATERAL  . Borderline diabetes     DIET CONTROLLED  . Gout, arthritis   . History of gastric ulcer     REMOTE HX  . Heart murmur     MILD --  ASYMPTOMATIC  . Hearing loss in left ear   . Wears glasses     Past Surgical History  Procedure Laterality Date  . Knee arthroscopy w/ meniscal repair Left JAN 2014  . Lumbar spine surgery  1991  . Prostate biopsy N/A 12/21/2012    Procedure: BIOPSY TRANSRECTAL ULTRASONIC PROSTATE (TUBP);  Surgeon: Ricky Ben, MD;  Location: [facility];  Service: Urology;  Laterality: N/A;    History reviewed. No pertinent family history.  Social History History  Substance Use Topics  . Smoking status: Former Smoker -- 1.00 packs/day for 30 years    Types: Cigarettes    Quit date: 12/15/1992  . Smokeless tobacco: Never Used  . Alcohol Use: Yes     Comment:  OCCASIONAL    No Known Allergies  Current Outpatient Prescriptions  Medication Sig Dispense Refill  . aliskiren (TEKTURNA) 300 MG tablet Take 300 mg by mouth every evening.      Marland Kitchen allopurinol (ZYLOPRIM) 300 MG tablet Take 300 mg by mouth every evening.      Marland Kitchen amLODipine-valsartan (EXFORGE) 5-320 MG per tablet Take 1 tablet by mouth every evening.      Marland Kitchen acetaminophen (TYLENOL) 500 MG tablet Take 500 mg by mouth every  6 (six) hours as needed for pain.      . clobetasol cream (TEMOVATE) 0.05 % Apply topically as needed.      . colchicine 0.6 MG tablet Take 0.6 mg by mouth as needed.      . metoprolol (LOPRESSOR) 50 MG tablet Take 50 mg by mouth daily.       No current facility-administered medications for this visit.    Review of Systems  Constitutional: negative Eyes: negative Ears, nose, mouth, throat, and face: negative Respiratory: positive for cough Cardiovascular: negative Gastrointestinal: negative Genitourinary:negative Integument/breast: negative Hematologic/lymphatic: negative Musculoskeletal:negative Neurological: negative Behavioral/Psych: negative Endocrine: negative Allergic/Immunologic: negative  Physical Exam  TGG:YIRSW, healthy, no distress, well nourished and well developed SKIN: skin color, texture, turgor are normal, no rashes or significant lesions HEAD: Normocephalic, No masses, lesions, tenderness or abnormalities EYES: normal, PERRLA EARS: External ears normal, Canals clear OROPHARYNX:no exudate, no erythema and lips, buccal mucosa, and tongue normal  NECK: supple, no adenopathy, no JVD LYMPH:  no palpable lymphadenopathy, no hepatosplenomegaly LUNGS: clear to auscultation , and palpation HEART: regular rate & rhythm and no murmurs ABDOMEN:abdomen soft, non-tender, normal bowel sounds and no masses or organomegaly BACK: Back symmetric, no curvature., No CVA tenderness EXTREMITIES:no joint deformities, effusion, or inflammation, no edema, no skin discoloration  NEURO: alert & oriented x 3 with fluent speech, no focal motor/sensory deficits  PERFORMANCE STATUS: ECOG 0  LABORATORY DATA: Lab Results  Component Value Date   WBC 6.3 03/14/2013   HGB 13.2 03/14/2013   HCT 39.9 03/14/2013   MCV 89.0 03/14/2013   PLT 155 03/14/2013      Chemistry      Component Value Date/Time   NA 139 03/14/2013 1159   NA 137 12/23/2012 1322   K 4.0 03/14/2013 1159   K 3.9 12/23/2012  1322   CL 102 12/23/2012 1322   CO2 21* 03/14/2013 1159   CO2 23 12/23/2012 1322   BUN 19.8 03/14/2013 1159   BUN 21 12/23/2012 1322   CREATININE 1.2 03/14/2013 1159   CREATININE 1.09 12/23/2012 1322      Component Value Date/Time   CALCIUM 9.2 03/14/2013 1159   CALCIUM 8.6 12/23/2012 1322   ALKPHOS 95 03/14/2013 1159   AST 28 03/14/2013 1159   ALT 31 03/14/2013 1159   BILITOT 0.41 03/14/2013 1159       RADIOGRAPHIC STUDIES: No results found.  ASSESSMENT: This is a very pleasant 68 years old [place] American male who presented today for evaluation of an elevated urine free kappa light chain with no monoclonal specificity. This is most likely nonspecific in nature.  PLAN: I have a lengthy discussion with the patient today about his condition. However several studies to rule out any monoclonal gammopathy or early signs of multiple myeloma. I order repeat CBC, comprehensive metabolic panel, LDH and myeloma panel. The patient is currently asymptomatic. I would see him back for followup visit in 3 weeks for reevaluation and discussion of his lab results. If there is any  concerning abnormalities, I may consider the patient for a bone marrow biopsy and aspirate otherwise he may continue on observation. The patient voices understanding of current disease status and treatment options and is in agreement with the current care plan.  All questions were answered. The patient knows to call the clinic with any problems, questions or concerns. We can certainly see the patient much sooner if necessary.  Thank you so much for allowing me to participate in the care of Ricky Rowland.. I will continue to follow up the patient with you and assist in his care.  I spent 35 minutes counseling the patient face to face. The total time spent in the appointment was 55 minutes.  Jahzaria Vary K. 03/14/2013, 12:41 PM

## 2013-03-14 NOTE — Progress Notes (Signed)
Checked in new pt with no financial concerns. °

## 2013-03-14 NOTE — Telephone Encounter (Signed)
GV AND PRINTED APPT SCHED AND AVS FOR PT FOR jAN 2015

## 2013-03-14 NOTE — Patient Instructions (Signed)
Followup visit in 3 weeks for evaluation and discussion of your pending lab results.

## 2013-03-15 LAB — IGG, IGA, IGM
IGA: 311 mg/dL (ref 68–379)
IGG (IMMUNOGLOBIN G), SERUM: 1070 mg/dL (ref 650–1600)
IgM, Serum: 84 mg/dL (ref 41–251)

## 2013-03-15 LAB — BETA 2 MICROGLOBULIN, SERUM: Beta-2 Microglobulin: 1.87 mg/L — ABNORMAL HIGH (ref 1.01–1.73)

## 2013-03-15 LAB — KAPPA/LAMBDA LIGHT CHAINS
KAPPA FREE LGHT CHN: 3.13 mg/dL — AB (ref 0.33–1.94)
KAPPA LAMBDA RATIO: 0.56 (ref 0.26–1.65)
Lambda Free Lght Chn: 5.58 mg/dL — ABNORMAL HIGH (ref 0.57–2.63)

## 2013-03-18 ENCOUNTER — Encounter: Payer: Self-pay | Admitting: Radiation Oncology

## 2013-03-18 NOTE — Progress Notes (Signed)
GU Location of Tumor / Histology: Adenocarcinoma of the prostte  If Prostate Cancer, Gleason Score is (3 + 4) and PSA is (9.06)  Patient presented with rising PSA  Biopsies of prostate(if applicable) revealed:  40/10/27 Diagnosis 1. Prostate, needle biopsy(ies), right base lateral - BENIGN PROSTATIC TISSUE WITH GLANDULAR ATROPHY AND ASSOCIATED CHRONIC INFLAMMATION. - NO EVIDENCE OF MALIGNANCY. 2. Prostate, needle biopsy(ies), right base medial - BENIGN PROSTATIC TISSUE WITH GLANDULAR ATROPHY AND ASSOCIATED CHRONIC INFLAMMATION. - NO EVIDENCE OF MALIGNANCY. 3. Prostate, needle biopsy(ies), right mid lateral - BENIGN PROSTATIC TISSUE WITH GLANDULAR ATROPHY AND ASSOCIATED CHRONIC INFLAMMATION. - NO EVIDENCE OF MALIGNANCY. 4. Prostate, needle biopsy(ies), right mid medial - BENIGN PROSTATIC TISSUE WITH GLANDULAR ATROPHY AND ASSOCIATED CHRONIC INFLAMMATION. - NO EVIDENCE OF MALIGNANCY. 5. Prostate, needle biopsy(ies), right apex lateral - BENIGN PROSTATIC TISSUE WITH ASSOCIATED CHRONIC INFLAMMATION AND GLANDULAR ATROPHY. - NO EVIDENCE OF MALIGNANCY. 6. Prostate, needle biopsy(ies), right apex medial - BENIGN PROSTATIC TISSUE. - NO EVIDENCE OF MALIGNANCY. 7. Prostate, needle biopsy(ies), left base lateral - BENIGN PROSTATIC TISSUE WITH ASSOCIATED CHRONIC INFLAMMATION. - NO EVIDENCE OF MALIGNANCY. 8. Prostate, needle biopsy(ies), left base medial - BENIGN PROSTATIC TISSUE WITH ASSOCIATED CHRONIC INFLAMMATION. - NO EVIDENCE OF MALIGNANCY. 9. Prostate, needle biopsy(ies), left mid lateral - BENIGN PROSTATIC TISSUE WITH ASSOCIATED CHRONIC INFLAMMATION. - NO EVIDENCE OF MALIGNANCY. 10. Prostate, needle biopsy(ies), left mid medial - MICROSCOPIC FOCUS OF PROSTATIC ADENOCARCINOMA, GLEASON'S SCORE 3+4=7, INVOLVING LESS THAN 2% OF ONE OF ONE CORE. - NO PERINEURAL INVASION IDENTIFIED. 11. Prostate, needle biopsy(ies), left apex lateral - MICROSCOPIC FOCUS OF PROSTATIC ADENOCARCINOMA,  GLEASON'S SCORE 3+3=6, INVOLVING LESS THAN 2% OF THE TISSUE. - NO PERINEURAL INVASION IDENTIFIED. 12. Prostate, needle biopsy(ies), left apex medial - BENIGN PROSTATIC TISSUE WITH GLANDULAR ATROPHY AND ASSOCIATED FIBROMUSCULAR HYPERPLASIA. - NO EVIDENCE OF MALIGNANCY.  Past/Anticipated interventions by urology, if any: Biopsy of Prostate  Past/Anticipated interventions by medical oncology, if any: None  Weight changes, if any: None  Bowel/Bladder complaints, if any: No issues  Nausea/Vomiting, if any: None  Pain issues, if any: None  SAFETY ISSUES:  Prior radiation? No  Pacemaker/ICD? No  Possible current pregnancy? No  Is the patient on methotrexate? No  Current Complaints / other details:  NOI

## 2013-03-24 ENCOUNTER — Encounter: Payer: Self-pay | Admitting: Radiation Oncology

## 2013-03-24 ENCOUNTER — Ambulatory Visit
Admission: RE | Admit: 2013-03-24 | Discharge: 2013-03-24 | Disposition: A | Discharge status: Home / Self Care | Payer: Medicare Other | Source: Ambulatory Visit | Attending: Radiation Oncology | Admitting: Radiation Oncology

## 2013-03-24 VITALS — BP 163/91 | HR 57 | Temp 98.0°F | Ht 75.0 in | Wt 240.6 lb

## 2013-03-24 DIAGNOSIS — C61 Malignant neoplasm of prostate: Secondary | ICD-10-CM

## 2013-03-24 HISTORY — DX: Malignant neoplasm of prostate: C61

## 2013-03-24 NOTE — Progress Notes (Signed)
[facility] NEW PATIENT EVALUATION  Name: Ricky KRUEGER Sr. MRN: [id number]  Date:   03/24/2013           DOB: 1945-04-28  Status: outpatient   CC: [name], MD  Lowella Bandy, MD    REFERRING PHYSICIAN: Lowella Bandy, MD   DIAGNOSIS: Stage TI C. intermediate risk adenocarcinoma prostate   HISTORY OF PRESENT ILLNESS:  Ricky Rowland. is a 68 y.o. male who is seen today through the courtesy of Dr. [name] for evaluation of his stage TI C. intermediate risk adenocarcinoma prostate. He had benign prostate biopsies in September of 2013 for an elevated PSA of 4.79. His gland volume was 37 cc. His PSA rose to 9.05 by September 2014 with repeat biopsies by Dr. [name] diagnostic for a microscopic focus of adenocarcinoma with a Gleason score 7 (3+4) from the left medial mid gland. This involved 2% of one core. He also had a microscopic focus of adenocarcinoma Gleason score 6 (3+3) involving less than 2% of one core from the left lateral apex. Prostate volume was 37 cc. He does have moderate obstructive symptomatology with an I PSS score of 16. No GI difficulty. He does have erectile dysfunction. More recently, he has been evaluated by Dr. Julien Nordmann for excess free kappa light chains in the urine which appear to be polyclonal. He is to see Dr. Earlie Server for a followup visit on January 26. Of note is that he has had some type of dermatologic condition with areas of eczema along back and hip beginning 7-8 months ago. He was seen by a dermatologist in [place] and placed on topical corticosteroids with minimal improvement. It is unclear as to whether not this is related to his urine protein.  PREVIOUS RADIATION THERAPY: No   PAST MEDICAL HISTORY:  has a past medical history of Elevated PSA; Hypertension; External carotid artery stenosis; Borderline diabetes; Gout, arthritis; History of gastric ulcer; Heart murmur; Hearing loss in left ear; Wears glasses; and Prostate  cancer (12/21/12).     PAST SURGICAL HISTORY:  Past Surgical History  Procedure Laterality Date  . Knee arthroscopy w/ meniscal repair Left JAN 2014  . Lumbar spine surgery  1991  . Prostate biopsy N/A 12/21/2012    Procedure: BIOPSY TRANSRECTAL ULTRASONIC PROSTATE (TUBP);  Surgeon: [name], MD;  Location: [facility];  Service: Urology;  Laterality: N/A;  . Prostate biopsy  11/26/11    benign     FAMILY HISTORY: Both his parents died from cardiac disease and complications of diabetes mellitus, his father at age 56 and his mother at age 57. No family history of prostate cancer.  SOCIAL HISTORY:  reports that he quit smoking about 20 years ago. His smoking use included Cigarettes. He has a 30 pack-year smoking history. He has never used smokeless tobacco. He reports that he drinks alcohol.   ALLERGIES: Review of patient's allergies indicates no known allergies.   MEDICATIONS:  Current Outpatient Prescriptions  Medication Sig Dispense Refill  . acetaminophen (TYLENOL) 500 MG tablet Take 500 mg by mouth every 6 (six) hours as needed for pain.      Marland Kitchen aliskiren (TEKTURNA) 300 MG tablet Take 300 mg by mouth every evening.      Marland Kitchen allopurinol (ZYLOPRIM) 300 MG tablet Take 300 mg by mouth every evening.      Marland Kitchen amLODipine-valsartan (EXFORGE) 5-320 MG per tablet Take 1 tablet by mouth every evening.      . benzonatate (TESSALON)  100 MG capsule Take 100 mg by mouth 3 (three) times daily as needed for cough.      . clobetasol cream (TEMOVATE) 0.05 % Apply topically as needed.      . colchicine 0.6 MG tablet Take 0.6 mg by mouth as needed.      . metoprolol (LOPRESSOR) 50 MG tablet Take 50 mg by mouth daily.       No current facility-administered medications for this encounter.     REVIEW OF SYSTEMS:  Pertinent items are noted in HPI.    PHYSICAL EXAM:  height is 6\' 3"  (1.905 m) and weight is 240 lb 9.6 oz (109.135 kg). His temperature is 98 F (36.7 C). His blood  pressure is 163/91 and his pulse is 57.   Alert and oriented 68 year old African American male appearing his stated age. Head and neck examination: Grossly unremarkable. Nodes: Without palpable cervical or supraclavicular lymphadenopathy. Chest: There are at least 2 eczematous patches along his back. Lungs are clear. Heart: Regular in rhythm. Abdomen: Without hepatomegaly. Genitalia: Unremarkable to inspection. Rectal: The prostate gland is normal in size and is without focal induration or nodularity. Extremities: Without edema.   LABORATORY DATA:  Lab Results  Component Value Date   WBC 6.3 03/14/2013   HGB 13.2 03/14/2013   HCT 39.9 03/14/2013   MCV 89.0 03/14/2013   PLT 155 03/14/2013   Lab Results  Component Value Date   NA 139 03/14/2013   K 4.0 03/14/2013   CL 102 12/23/2012   CO2 21* 03/14/2013   Lab Results  Component Value Date   ALT 31 03/14/2013   AST 28 03/14/2013   ALKPHOS 95 03/14/2013   BILITOT 0.41 03/14/2013      IMPRESSION: Stage TI C. intermediate risk adenocarcinoma prostate. I explained to the patient and his wife that his prognosis is related to his stage, PSA level, and Gleason score. His stage and PSA level are favorable while his Gleason score of 7 is of intermediate favorability. Other prognostic factors include PSA doubling time and disease volume. His doubling time is between 1 and 2 years and he has low disease volume. We discussed surgery versus close surveillance versus radiation therapy. Radiation therapy options include seed implantation alone or external beam/IMRT. Even though he has very low volume disease I and concerned about his Gleason score 7, PSA doubling time of just greater than one year, along with a PSA of 9.05 with a normal gland volume. I think it is reasonable to consider a repeat PSA along with repeat biopsies later this spring/summer. He would be a candidate for seed implantation although he does have an I PSS score of 16 which could improve with an  alpha-blocker. Ideally, I would like to see an I PSS score of less than 15. We discussed the potential acute and late toxicities of radiation therapy. His prognosis appears to be favorable. Lastly, his polyclonal light chains may be secondary to his dermatologic condition.   PLAN: The patient is interested in pursuing short-term surveillance, think that this is perfectly reasonable. I more than happy to see him in the future should he want to consider seed implantation or sternal beam/IMRT.  I spent 60 minutes minutes face to face with the patient and more than 50% of that time was spent in counseling and/or coordination of care.

## 2013-03-25 ENCOUNTER — Encounter: Payer: Self-pay | Admitting: *Deleted

## 2013-04-04 ENCOUNTER — Encounter: Payer: Self-pay | Admitting: Internal Medicine

## 2013-04-04 ENCOUNTER — Ambulatory Visit (HOSPITAL_BASED_OUTPATIENT_CLINIC_OR_DEPARTMENT_OTHER): Payer: 59 | Admitting: Internal Medicine

## 2013-04-04 ENCOUNTER — Telehealth: Payer: Self-pay | Admitting: Internal Medicine

## 2013-04-04 VITALS — BP 148/73 | HR 55 | Temp 98.1°F | Resp 18 | Ht 75.0 in | Wt 237.6 lb

## 2013-04-04 DIAGNOSIS — D472 Monoclonal gammopathy: Secondary | ICD-10-CM

## 2013-04-04 NOTE — Telephone Encounter (Signed)
gv and printed appt sched and avs for pt for Feb  °

## 2013-04-04 NOTE — Progress Notes (Signed)
    [facility] [phone]   [phone]  OFFICE PROGRESS NOTE  SPRUILL,JEROME O, MD [address] [place] [address]  DIAGNOSIS: Monoclonal gammopathy of undetermined significance (MGUS).  PRIOR THERAPY: None  CURRENT THERAPY: Observation  INTERVAL HISTORY: Ricky J Novack Sr. 67 y.o. male returns to the clinic today for followup visit accompanied by his wife. The patient is feeling fine today with no specific complaints. He denied having any significant weakness or fatigue. He continues to have arthralgias. The patient denied having any significant chest pain, shortness breath, cough or hemoptysis. He has no weight loss or night sweats. The patient had several studies performed recently for evaluation of the paraproteinemia including beta-2 microglobulin that was elevated at 1.87, free kappa light chains 3.13 under that chin 5.58 with a kappa/lambda ratio of 0.56. Quantitative immunoglobulins showed normal IgG 1070, IgA 311 and IgM 84.  MEDICAL HISTORY: Past Medical History  Diagnosis Date  . Elevated PSA   . Hypertension   . External carotid artery stenosis     MILD-- BILATERAL  . Borderline diabetes     DIET CONTROLLED  . Gout, arthritis   . History of gastric ulcer     REMOTE HX  . Heart murmur     MILD --  ASYMPTOMATIC  . Hearing loss in left ear   . Wears glasses   . Prostate cancer 12/21/12    ALLERGIES:  has No Known Allergies.  MEDICATIONS:  Current Outpatient Prescriptions  Medication Sig Dispense Refill  . acetaminophen (TYLENOL) 500 MG tablet Take 500 mg by mouth every 6 (six) hours as needed for pain.      . aliskiren (TEKTURNA) 300 MG tablet Take 300 mg by mouth every evening.      . allopurinol (ZYLOPRIM) 300 MG tablet Take 300 mg by mouth every evening.      . amLODipine-valsartan (EXFORGE) 5-320 MG per tablet Take 1 tablet by mouth every evening.      . benzonatate (TESSALON) 100 MG capsule Take 100 mg by  mouth 3 (three) times daily as needed for cough.      . clobetasol cream (TEMOVATE) 0.05 % Apply topically as needed.      . colchicine 0.6 MG tablet Take 0.6 mg by mouth as needed.      . metoprolol (LOPRESSOR) 50 MG tablet Take 50 mg by mouth daily.       No current facility-administered medications for this visit.    SURGICAL HISTORY:  Past Surgical History  Procedure Laterality Date  . Knee arthroscopy w/ meniscal repair Left JAN 2014  . Lumbar spine surgery  1991  . Prostate biopsy N/A 12/21/2012    Procedure: BIOPSY TRANSRECTAL ULTRASONIC PROSTATE (TUBP);  Surgeon: Marc-Henry Nesi, MD;  Location: [facility];  Service: Urology;  Laterality: N/A;  . Prostate biopsy  11/26/11    benign    REVIEW OF SYSTEMS:  A comprehensive review of systems was negative except for: Musculoskeletal: positive for arthralgias   PHYSICAL EXAMINATION: General appearance: alert, cooperative and no distress Head: Normocephalic, without obvious abnormality, atraumatic Neck: no adenopathy, no JVD, supple, symmetrical, trachea midline and thyroid not enlarged, symmetric, no tenderness/mass/nodules Lymph nodes: Cervical, supraclavicular, and axillary nodes normal. Resp: clear to auscultation bilaterally Back: symmetric, no curvature. ROM normal. No CVA tenderness. Cardio: regular rate and rhythm, S1, S2 normal, no murmur, click, rub or gallop GI: soft, non-tender; bowel sounds normal; no masses,  no organomegaly Extremities: extremities   normal, atraumatic, no cyanosis or edema  ECOG PERFORMANCE STATUS: 1 - Symptomatic but completely ambulatory  Blood pressure 148/73, pulse 55, temperature 98.1 F (36.7 C), temperature source Oral, resp. rate 18, height 6' 3" (1.905 m), weight 237 lb 9.6 oz (107.775 kg), SpO2 100.00%.  LABORATORY DATA: Lab Results  Component Value Date   WBC 6.3 03/14/2013   HGB 13.2 03/14/2013   HCT 39.9 03/14/2013   MCV 89.0 03/14/2013   PLT 155 03/14/2013      Chemistry       Component Value Date/Time   NA 139 03/14/2013 1159   NA 137 12/23/2012 1322   K 4.0 03/14/2013 1159   K 3.9 12/23/2012 1322   CL 102 12/23/2012 1322   CO2 21* 03/14/2013 1159   CO2 23 12/23/2012 1322   BUN 19.8 03/14/2013 1159   BUN 21 12/23/2012 1322   CREATININE 1.2 03/14/2013 1159   CREATININE 1.09 12/23/2012 1322      Component Value Date/Time   CALCIUM 9.2 03/14/2013 1159   CALCIUM 8.6 12/23/2012 1322   ALKPHOS 95 03/14/2013 1159   AST 28 03/14/2013 1159   ALT 31 03/14/2013 1159   BILITOT 0.41 03/14/2013 1159       RADIOGRAPHIC STUDIES: No results found.  ASSESSMENT AND PLAN: This is a very pleasant 67 years old African American male with paraproteinemia questionable for monoclonal gammopathy of undetermined significance (MGUS). I have a lengthy discussion with the patient and his wife today about his condition. I gave the patient the option of continuous observation and close monitoring with repeat myeloma panel versus proceeding with a bone marrow biopsy and aspirate to rule out underlying multiple myeloma. The patient would like to proceed with the biopsy. I would see him back for followup visit in one month's for reevaluation and discussion of his biopsy results and recommendation about any further treatment. He was advised to call immediately if he has any concerning symptoms in the interval.  The patient voices understanding of current disease status and treatment options and is in agreement with the current care plan.  All questions were answered. The patient knows to call the clinic with any problems, questions or concerns. We can certainly see the patient much sooner if necessary.  Disclaimer: This note was dictated with voice recognition software. Similar sounding words can inadvertently be transcribed and may not be corrected upon review.       

## 2013-04-04 NOTE — Patient Instructions (Signed)
Followup visit in one month after repeating a bone marrow biopsy and aspirate.

## 2013-04-06 ENCOUNTER — Other Ambulatory Visit: Payer: Self-pay | Admitting: Radiology

## 2013-04-11 ENCOUNTER — Other Ambulatory Visit: Payer: Self-pay | Admitting: Radiology

## 2013-04-12 ENCOUNTER — Encounter (HOSPITAL_COMMUNITY): Payer: Self-pay | Admitting: Pharmacy Technician

## 2013-04-13 ENCOUNTER — Ambulatory Visit (HOSPITAL_COMMUNITY)
Admission: RE | Admit: 2013-04-13 | Discharge: 2013-04-13 | Disposition: A | Discharge status: Home / Self Care | Payer: Medicare Other | Source: Ambulatory Visit | Attending: Internal Medicine | Admitting: Internal Medicine

## 2013-04-13 ENCOUNTER — Encounter (HOSPITAL_COMMUNITY): Payer: Self-pay

## 2013-04-13 DIAGNOSIS — M109 Gout, unspecified: Secondary | ICD-10-CM | POA: Insufficient documentation

## 2013-04-13 DIAGNOSIS — Z87891 Personal history of nicotine dependence: Secondary | ICD-10-CM | POA: Insufficient documentation

## 2013-04-13 DIAGNOSIS — I10 Essential (primary) hypertension: Secondary | ICD-10-CM | POA: Insufficient documentation

## 2013-04-13 DIAGNOSIS — D472 Monoclonal gammopathy: Secondary | ICD-10-CM

## 2013-04-13 DIAGNOSIS — Z8546 Personal history of malignant neoplasm of prostate: Secondary | ICD-10-CM | POA: Insufficient documentation

## 2013-04-13 LAB — PROTIME-INR
INR: 0.88 (ref 0.00–1.49)
Prothrombin Time: 11.8 seconds (ref 11.6–15.2)

## 2013-04-13 LAB — APTT: aPTT: 26 seconds (ref 24–37)

## 2013-04-13 LAB — CBC
HCT: 39.5 % (ref 39.0–52.0)
HEMOGLOBIN: 13.2 g/dL (ref 13.0–17.0)
MCH: 29.7 pg (ref 26.0–34.0)
MCHC: 33.4 g/dL (ref 30.0–36.0)
MCV: 89 fL (ref 78.0–100.0)
PLATELETS: 154 10*3/uL (ref 150–400)
RBC: 4.44 MIL/uL (ref 4.22–5.81)
RDW: 14 % (ref 11.5–15.5)
WBC: 6.4 10*3/uL (ref 4.0–10.5)

## 2013-04-13 LAB — BONE MARROW EXAM

## 2013-04-13 MED ORDER — FENTANYL CITRATE 0.05 MG/ML IJ SOLN
INTRAMUSCULAR | Status: AC | PRN
  Administered 2013-04-13: 100 ug via INTRAVENOUS

## 2013-04-13 MED ORDER — MIDAZOLAM HCL 2 MG/2ML IJ SOLN
INTRAMUSCULAR | Status: AC
  Filled 2013-04-13: qty 4

## 2013-04-13 MED ORDER — MIDAZOLAM HCL 2 MG/2ML IJ SOLN
INTRAMUSCULAR | Status: AC | PRN
  Administered 2013-04-13: 2 mg via INTRAVENOUS

## 2013-04-13 MED ORDER — FENTANYL CITRATE 0.05 MG/ML IJ SOLN
INTRAMUSCULAR | Status: AC
  Filled 2013-04-13: qty 4

## 2013-04-13 MED ORDER — SODIUM CHLORIDE 0.9 % IV SOLN
Freq: Once | INTRAVENOUS | Status: AC
  Administered 2013-04-13: 07:00:00 via INTRAVENOUS

## 2013-04-13 MED ORDER — HYDROCODONE-ACETAMINOPHEN 5-325 MG PO TABS
1.0000 | ORAL_TABLET | ORAL | Status: DC | PRN
  Filled 2013-04-13: qty 2

## 2013-04-13 NOTE — Procedures (Signed)
Interventional Radiology Procedure Note  Procedure: CT guided aspirate and core biopsy of right iliac bone Complications: None Recommendations: - Bedrest supine x 2 hrs - Hydrocodone PRN  Pain - Follow biopsy results  Signed,  Barbra Miner K. Krystie Leiter, MD [facility]   

## 2013-04-13 NOTE — Discharge Instructions (Signed)
Bone Marrow Aspiration, Bone Marrow Biopsy Care After Read the instructions outlined below and refer to this sheet in the next few weeks. These discharge instructions provide you with general information on caring for yourself after you leave the hospital. Your caregiver may also give you specific instructions. While your treatment has been planned according to the most current medical practices available, unavoidable complications occasionally occur. If you have any problems or questions after discharge, call your caregiver. FINDING OUT THE RESULTS OF YOUR TEST Not all test results are available during your visit. If your test results are not back during the visit, make an appointment with your caregiver to find out the results. Do not assume everything is normal if you have not heard from your caregiver or the medical facility. It is important for you to follow up on all of your test results.  HOME CARE INSTRUCTIONS  You have had sedation and may be sleepy or dizzy. Your thinking may not be as clear as usual. For the next 24 hours:  Only take over-the-counter or prescription medicines for pain, discomfort, and or fever as directed by your caregiver.  Do not drink alcohol.  Do not smoke.  Do not drive.  Do not make important legal decisions.  Do not operate heavy machinery.  Do not care for small children by yourself.  Keep your dressing clean and dry. You may replace dressing with a bandage after 24 hours.  You may take a bath or shower after 24 hours.  Use an ice pack for 20 minutes every 2 hours while awake for pain as needed. SEEK MEDICAL CARE IF:   There is redness, swelling, or increasing pain at the biopsy site.  There is pus coming from the biopsy site.  There is drainage from a biopsy site lasting longer than one day.  An unexplained oral temperature above 102 F (38.9 C) develops. SEEK IMMEDIATE MEDICAL CARE IF:   You develop a rash.  You have difficulty  breathing.  You develop any reaction or side effects to medications given. Document Released: 09/13/2004 Document Revised: 05/19/2011 Document Reviewed: 02/22/2008 [facility] Patient Information 2014 [place]. Conscious Sedation, Adult, Care After Refer to this sheet in the next few weeks. These instructions provide you with information on caring for yourself after your procedure. Your health care provider may also give you more specific instructions. Your treatment has been planned according to current medical practices, but problems sometimes occur. Call your health care provider if you have any problems or questions after your procedure. WHAT TO EXPECT AFTER THE PROCEDURE  After your procedure:  You may feel sleepy, clumsy, and have poor balance for several hours.  Vomiting may occur if you eat too soon after the procedure. HOME CARE INSTRUCTIONS  Do not participate in any activities where you could become injured for at least 24 hours. Do not:  Drive.  Swim.  Ride a bicycle.  Operate heavy machinery.  Cook.  Use power tools.  Climb ladders.  Work from a high place.  Do not make important decisions or sign legal documents until you are improved.  If you vomit, drink water, juice, or soup when you can drink without vomiting. Make sure you have little or no nausea before eating solid foods.  Only take over-the-counter or prescription medicines for pain, discomfort, or fever as directed by your health care provider.  Make sure you and your family fully understand everything about the medicines given to you, including what side effects may occur.  You should not drink alcohol, take sleeping pills, or take medicines that cause drowsiness for at least 24 hours.  If you smoke, do not smoke without supervision.  If you are feeling better, you may resume normal activities 24 hours after you were sedated.  Keep all appointments with your health care provider. SEEK  MEDICAL CARE IF:  Your skin is pale or bluish in color.  You continue to feel nauseous or vomit.  Your pain is getting worse and is not helped by medicine.  You have bleeding or swelling.  You are still sleepy or feeling clumsy after 24 hours. SEEK IMMEDIATE MEDICAL CARE IF:  You develop a rash.  You have difficulty breathing.  You develop any type of allergic problem.  You have a fever. MAKE SURE YOU:  Understand these instructions.  Will watch your condition.  Will get help right away if you are not doing well or get worse. Document Released: 12/15/2012 Document Reviewed: 10/01/2012 [facility] Patient Information 2014 [place], [place].

## 2013-04-13 NOTE — H&P (Signed)
Chief Complaint: "I'm here for a bone marrow biopsy" Referring Physician:Mohamed HPI: Ricky Rowland. is an 69 y.o. male with findings of monoclonal gammopathy. He is scheduled today for bone marrow biopsy. PMHx and meds reviewed.  Past Medical History:  Past Medical History  Diagnosis Date  . Elevated PSA   . Hypertension   . External carotid artery stenosis     MILD-- BILATERAL  . Borderline diabetes     DIET CONTROLLED  . Gout, arthritis   . History of gastric ulcer     REMOTE HX  . Heart murmur     MILD --  ASYMPTOMATIC  . Hearing loss in left ear   . Wears glasses   . Prostate cancer 12/21/12    Past Surgical History:  Past Surgical History  Procedure Laterality Date  . Knee arthroscopy w/ meniscal repair Left JAN 2014  . Lumbar spine surgery  1991  . Prostate biopsy N/A 12/21/2012    Procedure: BIOPSY TRANSRECTAL ULTRASONIC PROSTATE (TUBP);  Surgeon: [name], MD;  Location: [facility];  Service: Urology;  Laterality: N/A;  . Prostate biopsy  11/26/11    benign    Family History: History reviewed. No pertinent family history.  Social History:  reports that he quit smoking about 20 years ago. His smoking use included Cigarettes. He has a 30 pack-year smoking history. He has never used smokeless tobacco. He reports that he drinks alcohol. His drug history is not on file.  Allergies: No Known Allergies  Medications:   Medication List    ASK your doctor about these medications       acetaminophen 500 MG tablet  Commonly known as:  TYLENOL  Take 500 mg by mouth every 6 (six) hours as needed for pain.     aliskiren 300 MG tablet  Commonly known as:  TEKTURNA  Take 300 mg by mouth every evening.     allopurinol 300 MG tablet  Commonly known as:  ZYLOPRIM  Take 300 mg by mouth every evening.     amLODipine-valsartan 5-320 MG per tablet  Commonly known as:  EXFORGE  Take 1 tablet by mouth every evening.     clobetasol cream 0.05  %  Commonly known as:  TEMOVATE  Apply 1 application topically as needed (apply to rash on back).     colchicine 0.6 MG tablet  Take 0.6 mg by mouth daily as needed (for gout).     metoprolol 50 MG tablet  Commonly known as:  LOPRESSOR  Take 50 mg by mouth every evening.     saw palmetto 500 MG capsule  Take 500 mg by mouth 2 (two) times daily.        Please HPI for pertinent positives, otherwise complete 10 system ROS negative.  Physical Exam: BP 161/78  Pulse 54  Temp(Src) 97.6 F (36.4 C) (Oral)  Resp 20  Ht _0  (1.905 m)  Wt 237 lb (107.502 kg)  BMI 29.62 kg/m2  SpO2 100% Body mass index is 29.62 kg/(m^2).   General Appearance:  Alert, cooperative, no distress, appears stated age  Head:  Normocephalic, without obvious abnormality, atraumatic  ENT: Unremarkable  Neck: Supple, symmetrical, trachea midline  Lungs:   Clear to auscultation bilaterally, no w/r/r  Chest Wall:  No tenderness or deformity  Heart:  Regular rate and rhythm, S1, S2 normal, no murmur, rub or gallop.  Abdomen:   Soft, non-tender, non distended.  Extremities: Extremities normal, atraumatic, no cyanosis or edema  Pulses: 2+ and symmetric  Neurologic: Normal affect, no gross deficits.   Results for orders placed during the hospital encounter of 04/13/13 (from the past 48 hour(s))  APTT     Status: None   Collection Time    04/13/13  7:07 AM      Result Value Range   aPTT 26  24 - 37 seconds  CBC     Status: None   Collection Time    04/13/13  7:07 AM      Result Value Range   WBC 6.4  4.0 - 10.5 K/uL   RBC 4.44  4.22 - 5.81 MIL/uL   Hemoglobin 13.2  13.0 - 17.0 g/dL   HCT 39.5  39.0 - 52.0 %   MCV 89.0  78.0 - 100.0 fL   MCH 29.7  26.0 - 34.0 pg   MCHC 33.4  30.0 - 36.0 g/dL   RDW 14.0  11.5 - 15.5 %   Platelets 154  150 - 400 K/uL  PROTIME-INR     Status: None   Collection Time    04/13/13  7:07 AM      Result Value Range   Prothrombin Time 11.8  11.6 - 15.2 seconds   INR 0.88   0.00 - 1.49   No results found.  Assessment/Plan Monoclonal gammopathy For CT guided bone marrow biopsy Explained procedure, risks, complications, use of sedation Labs reviewed. Consent signed in chart  Ascencion Dike PA-C 04/13/2013, 8:29 AM

## 2013-04-21 LAB — TISSUE HYBRIDIZATION (BONE MARROW)-NCBH

## 2013-04-21 LAB — CHROMOSOME ANALYSIS, BONE MARROW

## 2013-05-02 ENCOUNTER — Telehealth: Payer: Self-pay | Admitting: Internal Medicine

## 2013-05-02 ENCOUNTER — Ambulatory Visit (HOSPITAL_BASED_OUTPATIENT_CLINIC_OR_DEPARTMENT_OTHER): Payer: 59 | Admitting: Internal Medicine

## 2013-05-02 ENCOUNTER — Encounter: Payer: Self-pay | Admitting: Internal Medicine

## 2013-05-02 VITALS — BP 142/88 | HR 54 | Temp 98.0°F | Resp 18 | Ht 75.0 in | Wt 240.6 lb

## 2013-05-02 DIAGNOSIS — Z8546 Personal history of malignant neoplasm of prostate: Secondary | ICD-10-CM | POA: Insufficient documentation

## 2013-05-02 DIAGNOSIS — C61 Malignant neoplasm of prostate: Secondary | ICD-10-CM | POA: Insufficient documentation

## 2013-05-02 DIAGNOSIS — D472 Monoclonal gammopathy: Secondary | ICD-10-CM

## 2013-05-02 NOTE — Telephone Encounter (Signed)
s.w. pt and advised on May appt....pt ok adn aware °

## 2013-05-02 NOTE — Progress Notes (Signed)
[facility] [phone]   [phone]  OFFICE PROGRESS NOTE  [name], MD [place] [address]  DIAGNOSIS: Monoclonal gammopathy of undetermined significance (MGUS).  PRIOR THERAPY: None  CURRENT THERAPY: Observation  INTERVAL HISTORY: Ricky WYNTER Sr. 68 y.o. male returns to the clinic today for followup visit. The patient is feeling fine today with no specific complaints, except for mild swelling of the left ring finger secondary to a gout episode. He took his gout medication and felt better. He denied having any significant weakness or fatigue. He continues to have arthralgias. The patient denied having any significant chest pain, shortness breath, cough or hemoptysis. He has no weight loss or night sweats. The patient had a bone marrow biopsy and aspirate performed recently and he is here for evaluation and discussion of his biopsy results and recommendation regarding treatment of his condition.  MEDICAL HISTORY: Past Medical History  Diagnosis Date  . Elevated PSA   . Hypertension   . External carotid artery stenosis     MILD-- BILATERAL  . Borderline diabetes     DIET CONTROLLED  . Gout, arthritis   . History of gastric ulcer     REMOTE HX  . Heart murmur     MILD --  ASYMPTOMATIC  . Hearing loss in left ear   . Wears glasses   . Prostate cancer 12/21/12    ALLERGIES:  has No Known Allergies.  MEDICATIONS:  Current Outpatient Prescriptions  Medication Sig Dispense Refill  . acetaminophen (TYLENOL) 500 MG tablet Take 500 mg by mouth every 6 (six) hours as needed for pain.      Marland Kitchen allopurinol (ZYLOPRIM) 300 MG tablet Take 300 mg by mouth every evening.      Marland Kitchen amLODipine-valsartan (EXFORGE) 5-320 MG per tablet Take 1 tablet by mouth every evening.      . clobetasol cream (TEMOVATE) 2.72 % Apply 1 application topically as needed (apply to rash on back).       . colchicine 0.6 MG tablet Take 0.6 mg  by mouth daily as needed (for gout).       . metoprolol (LOPRESSOR) 50 MG tablet Take 50 mg by mouth every evening.       . saw palmetto 500 MG capsule Take 500 mg by mouth 2 (two) times daily.       No current facility-administered medications for this visit.    SURGICAL HISTORY:  Past Surgical History  Procedure Laterality Date  . Knee arthroscopy w/ meniscal repair Left JAN 2014  . Lumbar spine surgery  1991  . Prostate biopsy N/A 12/21/2012    Procedure: BIOPSY TRANSRECTAL ULTRASONIC PROSTATE (TUBP);  Surgeon: [name], MD;  Location: [facility];  Service: Urology;  Laterality: N/A;  . Prostate biopsy  11/26/11    benign    REVIEW OF SYSTEMS:  Constitutional: negative Eyes: negative Ears, nose, mouth, throat, and face: negative Respiratory: negative Cardiovascular: negative Gastrointestinal: negative Genitourinary:negative Integument/breast: negative Hematologic/lymphatic: negative Musculoskeletal:negative Neurological: negative Behavioral/Psych: negative Endocrine: negative Allergic/Immunologic: negative   PHYSICAL EXAMINATION: General appearance: alert, cooperative and no distress Head: Normocephalic, without obvious abnormality, atraumatic Neck: no adenopathy, no JVD, supple, symmetrical, trachea midline and thyroid not enlarged, symmetric, no tenderness/mass/nodules Lymph nodes: Cervical, supraclavicular, and axillary nodes normal. Resp: clear to auscultation bilaterally Back: symmetric, no curvature. ROM normal. No CVA tenderness. Cardio: regular rate and rhythm, S1, S2 normal, no murmur, click, rub or gallop GI:  soft, non-tender; bowel sounds normal; no masses,  no organomegaly Extremities: extremities normal, atraumatic, no cyanosis or edema  ECOG PERFORMANCE STATUS: 1 - Symptomatic but completely ambulatory  Blood pressure 142/88, pulse 54, temperature 98 F (36.7 C), temperature source Oral, resp. rate 18, height $RemoveBe'6\' 3"'[id number]$  (1.905 m), weight  240 lb 9.6 oz (109.135 kg).  LABORATORY DATA: Lab Results  Component Value Date   WBC 6.4 04/13/2013   HGB 13.2 04/13/2013   HCT 39.5 04/13/2013   MCV 89.0 04/13/2013   PLT 154 04/13/2013      Chemistry      Component Value Date/Time   NA 139 03/14/2013 1159   NA 137 12/23/2012 1322   K 4.0 03/14/2013 1159   K 3.9 12/23/2012 1322   CL 102 12/23/2012 1322   CO2 21* 03/14/2013 1159   CO2 23 12/23/2012 1322   BUN 19.8 03/14/2013 1159   BUN 21 12/23/2012 1322   CREATININE 1.2 03/14/2013 1159   CREATININE 1.09 12/23/2012 1322      Component Value Date/Time   CALCIUM 9.2 03/14/2013 1159   CALCIUM 8.6 12/23/2012 1322   ALKPHOS 95 03/14/2013 1159   AST 28 03/14/2013 1159   ALT 31 03/14/2013 1159   BILITOT 0.41 03/14/2013 1159     BONE MARROW REPORT FINAL DIAGNOSIS Diagnosis Bone Marrow, Aspirate,Biopsy, and Clot, right iliac - HYPERCELLULAR BONE MARROW FOR AGE WITH TRILINEAGE HEMATOPOIESIS AND 6% PLASMA CELLS. - SEE COMMENT. PERIPHERAL BLOOD: - NO SIGNIFICANT MORPHOLOGIC ABNORMALITIES. Diagnosis Note The bone marrow is variably cellular but generally hypercellular for age with trilineage hematopoiesis and nonspecific changes. The plasma cells represent 6% of all cells with lack of large aggregates or sheets. Immunohistochemical stains show that the plasma cells display polyclonal staining pattern for kappa and lambda light chains although there is slight lambda light chain excess. The overall features are non specific and not diagnostic of a plasma cell neoplasm. Clinical correlation is recommended. (BNS:caf/gt, 04/14/13) Susanne Greenhouse MD Pathologist, Electronic Signature (Case signed 04/15/2013)  RADIOGRAPHIC STUDIES: No results found.  ASSESSMENT AND PLAN: This is a very pleasant 68 years old African American male with paraproteinemia questionable for monoclonal gammopathy of undetermined significance (MGUS). The bone marrow biopsy and aspirate showed 6% plasma cells which is nonspecific and not  diagnostic of a plasma cell neoplasm. I discussed the biopsy results with the patient today. I recommended for him to continue on observation with repeat myeloma panel in 3 months He was advised to call immediately if he has any concerning symptoms in the interval.  The patient voices understanding of current disease status and treatment options and is in agreement with the current care plan.  All questions were answered. The patient knows to call the clinic with any problems, questions or concerns. We can certainly see the patient much sooner if necessary.   Disclaimer: This note was dictated with voice recognition software. Similar sounding words can inadvertently be transcribed and may not be corrected upon review.

## 2013-06-21 ENCOUNTER — Telehealth: Payer: Self-pay | Admitting: Internal Medicine

## 2013-06-21 NOTE — Telephone Encounter (Signed)
per MM to resch-cld & spoke w/pt to adv of new time & date for appt-pt understood

## 2013-07-11 NOTE — Progress Notes (Addendum)
GU Location of Tumor / Histology: Adenocarcinoma of the prostate   If Prostate Cancer, Gleason Score is (3 + 4) and PSA is (9.06)  Patient presented with rising PSA   Biopsies of prostate(if applicable) revealed:  43/32/95  Diagnosis  1. Prostate, needle biopsy(ies), right base lateral  - BENIGN PROSTATIC TISSUE WITH GLANDULAR ATROPHY AND ASSOCIATED CHRONIC  INFLAMMATION.  - NO EVIDENCE OF MALIGNANCY.  2. Prostate, needle biopsy(ies), right base medial  - BENIGN PROSTATIC TISSUE WITH GLANDULAR ATROPHY AND ASSOCIATED CHRONIC  INFLAMMATION.  - NO EVIDENCE OF MALIGNANCY.  3. Prostate, needle biopsy(ies), right mid lateral  - BENIGN PROSTATIC TISSUE WITH GLANDULAR ATROPHY AND ASSOCIATED CHRONIC  INFLAMMATION.  - NO EVIDENCE OF MALIGNANCY.  4. Prostate, needle biopsy(ies), right mid medial  - BENIGN PROSTATIC TISSUE WITH GLANDULAR ATROPHY AND ASSOCIATED CHRONIC  INFLAMMATION.  - NO EVIDENCE OF MALIGNANCY.  5. Prostate, needle biopsy(ies), right apex lateral  - BENIGN PROSTATIC TISSUE WITH ASSOCIATED CHRONIC INFLAMMATION AND GLANDULAR  ATROPHY.  - NO EVIDENCE OF MALIGNANCY.  6. Prostate, needle biopsy(ies), right apex medial  - BENIGN PROSTATIC TISSUE.  - NO EVIDENCE OF MALIGNANCY.  7. Prostate, needle biopsy(ies), left base lateral  - BENIGN PROSTATIC TISSUE WITH ASSOCIATED CHRONIC INFLAMMATION.  - NO EVIDENCE OF MALIGNANCY.  8. Prostate, needle biopsy(ies), left base medial  - BENIGN PROSTATIC TISSUE WITH ASSOCIATED CHRONIC INFLAMMATION.  - NO EVIDENCE OF MALIGNANCY.  9. Prostate, needle biopsy(ies), left mid lateral  - BENIGN PROSTATIC TISSUE WITH ASSOCIATED CHRONIC INFLAMMATION.  - NO EVIDENCE OF MALIGNANCY.  10. Prostate, needle biopsy(ies), left mid medial  - MICROSCOPIC FOCUS OF PROSTATIC ADENOCARCINOMA, GLEASON'S SCORE 3+4=7,  INVOLVING LESS THAN 2% OF ONE OF ONE CORE.  - NO PERINEURAL INVASION IDENTIFIED.  11. Prostate, needle biopsy(ies), left apex lateral  -  MICROSCOPIC FOCUS OF PROSTATIC ADENOCARCINOMA, GLEASON'S SCORE 3+3=6,  INVOLVING LESS THAN 2% OF THE TISSUE.  - NO PERINEURAL INVASION IDENTIFIED.  12. Prostate, needle biopsy(ies), left apex medial  - BENIGN PROSTATIC TISSUE WITH GLANDULAR ATROPHY AND ASSOCIATED FIBROMUSCULAR  HYPERPLASIA.  - NO EVIDENCE OF MALIGNANCY.   Past/Anticipated interventions by urology, if any: Biopsy of Prostate   Past/Anticipated interventions by medical oncology, if any: None   Weight changes, if any: None   Bowel/Bladder complaints, if any: frequency, urgency, weak stream, nocturia x 1,  IPSS 14  Nausea/Vomiting, if any: None  Pain issues, if any: scrotal and generalized joint pain  SAFETY ISSUES:  Prior radiation? No  Pacemaker/ICD? No  Possible current pregnancy? Na Is the patient on methotrexate? No  Current Complaints / other details: 06/29/13 PSA 11.13              11/26/12 PSA 9.05                          05/27/12 PSA 5.10 Pt would like to consider prostate seed implant.   Pt sees Dr Julien Nordmann for monoclonal gammopathy of undetermined significance, next appointment on 07/26/13. He has recently begun to have generalized joint pain of which Dr Julien Nordmann is aware. He also states he was on an antibiotic 2-3 weeks ago for "prostatis", scrotal pain.

## 2013-07-12 ENCOUNTER — Encounter: Payer: Self-pay | Admitting: Radiation Oncology

## 2013-07-14 ENCOUNTER — Ambulatory Visit
Admission: RE | Admit: 2013-07-14 | Discharge: 2013-07-14 | Disposition: A | Discharge status: Home / Self Care | Payer: Medicare Other | Source: Ambulatory Visit | Attending: Radiation Oncology | Admitting: Radiation Oncology

## 2013-07-14 VITALS — BP 149/87 | HR 55 | Temp 98.5°F | Resp 20 | Ht 75.0 in | Wt 233.0 lb

## 2013-07-14 DIAGNOSIS — M545 Low back pain, unspecified: Secondary | ICD-10-CM | POA: Diagnosis not present

## 2013-07-14 DIAGNOSIS — G8929 Other chronic pain: Secondary | ICD-10-CM | POA: Insufficient documentation

## 2013-07-14 DIAGNOSIS — C61 Malignant neoplasm of prostate: Secondary | ICD-10-CM | POA: Insufficient documentation

## 2013-07-14 DIAGNOSIS — Z510 Encounter for antineoplastic radiation therapy: Secondary | ICD-10-CM | POA: Insufficient documentation

## 2013-07-14 DIAGNOSIS — Z8546 Personal history of malignant neoplasm of prostate: Secondary | ICD-10-CM

## 2013-07-14 NOTE — Progress Notes (Signed)
Please see the Nurse Progress Note in the MD Initial Consult Encounter for this patient. 

## 2013-07-14 NOTE — Progress Notes (Signed)
CC: Dr. Lowella Bandy, Dr. 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 I first saw the patient in consultation on 03/24/2013 .He had benign prostate biopsies in September of 2013 for an elevated PSA of 4.79. His gland volume was 37 cc. His PSA rose to 9.05 by September 2014 with repeat biopsies by Dr. [name] diagnostic for a microscopic focus of adenocarcinoma with a Gleason score 7 (3+4) from the left medial mid gland. This involved 2% of one core. He also had a microscopic focus of adenocarcinoma Gleason score 6 (3+3) involving less than 2% of one core from the left lateral apex. Prostate volume was 37 cc.  He elected for short term surveillance. When I saw him in January his I PSS score was 16. I do not believe that he was given a trial of an alpha blocker. Recently, Dr. [name] repeated his PSA on June 29, 2013 it rose to 11.13. He seen today for consideration of radiation therapy, and in particular,  seed implantation alone. His I PSS score today is 14. He scores a 5 in terms of weakness of urinary stream, and a 4, finding it difficult to postpone urination. His quality score is 5/6 (unhappy). I did speak with Dr. [name] earlier today, and he tells me that his I PSS score in his office was in the 6 range, but was elevated on one occasion to just over 10. Of note is that  he appears to have recurrence of his right epididymitis. 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 He will see Dr. Earlie Server in the near future.  Physical examination: Alert and oriented. Filed Vitals:   07/14/13 0828  BP: 149/87  Pulse: 55  Temp: 98.5 F (36.9 C)  Resp: 20   Rectal summation: Gland is normal in size and is without focal induration or  nodularity. Genitalia: There is tenderness on palpation of the right epididymis.  Abdomen: PSA 11.13 from 06/29/2013  Impression: Clinical stage TI C. 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 For a couple of reasons, I do not think that [name] is an ideal candidate for seed implantation. First of all, he appears to have moderate obstructive symptomatology that can clearly worsen with seed implantation compared to external beam/IMRT. Dr. [name] tells me that his I PSS score from his office have not been as high. Another concern is he may very well have high volume disease based on his rising PSA which does not correlate well with his low disease volume seen on biopsy from last fall. A MRI scan of the prostate may shed some light on his disease volume, amount of higher grade disease, and give [place] some idea as to whether or not he is at significant risk for extracapsular extension. Lastly, from a technical standpoint, with a Gleason score 7 and PSA over 10 he has "high-risk disease", and national guidelines discourage seed implantation alone. Therefore, he may be a better candidate for external beam/IMRT which would give better periprostatic coverage and be better tolerated from a urinary standpoint. He could also be a candidate for short-term androgen deprivation therapy. Dr. [name] is willing to see him again to assess his obstructive symptomatology, and also to evaluate what may be recurrent right-sided epididymitis.  Plan: As above. Dr. [name]  will contact me to discuss his management after he is seen once again for a followup visit by Dr. [name].  45 minutes was spent face-to-face with the patient, primarily counseling the patient and coordinating his care.

## 2013-07-15 ENCOUNTER — Telehealth: Payer: Self-pay | Admitting: *Deleted

## 2013-07-15 ENCOUNTER — Encounter: Payer: Self-pay | Admitting: Radiation Oncology

## 2013-07-15 NOTE — Telephone Encounter (Signed)
CALLED PATIENT TO INFORM OF GOLD SEED PLACEMENT ON 08/11/13 ARRIVAL TIME - 10:45 AM @ DR. [name]' OFFICE AND HIS SIM ON 08-22-13 @ 10 AM @ DR. MURRAY'S OFFICE, LVM FOR A RETURN CALL

## 2013-07-15 NOTE — Progress Notes (Signed)
Chart note: The patient met with Dr. Nesi and he is decided on external beam/IMRT. We'll go ahead and get 3 gold seeds placed by Dr. Nesi and them get him scheduled for CT simulation. He'll sign a consent form when he visits for CT simulation. I reviewed with him my desire to have him have a comfortably full bladder for CT simulation and for daily treatment. 

## 2013-07-15 NOTE — Addendum Note (Signed)
Encounter addended by: Rexene Edison, MD on: 07/15/2013 12:36 PM<BR>     Documentation filed: Arn Medal VN

## 2013-07-18 ENCOUNTER — Other Ambulatory Visit (HOSPITAL_BASED_OUTPATIENT_CLINIC_OR_DEPARTMENT_OTHER): Payer: 59

## 2013-07-18 DIAGNOSIS — D472 Monoclonal gammopathy: Secondary | ICD-10-CM

## 2013-07-18 LAB — COMPREHENSIVE METABOLIC PANEL (CC13)
ALBUMIN: 3.9 g/dL (ref 3.5–5.0)
ALK PHOS: 74 U/L (ref 40–150)
ALT: 23 U/L (ref 0–55)
AST: 22 U/L (ref 5–34)
Anion Gap: 8 mEq/L (ref 3–11)
BUN: 23.6 mg/dL (ref 7.0–26.0)
CO2: 24 mEq/L (ref 22–29)
Calcium: 9.4 mg/dL (ref 8.4–10.4)
Chloride: 109 mEq/L (ref 98–109)
Creatinine: 1.1 mg/dL (ref 0.7–1.3)
Glucose: 121 mg/dl (ref 70–140)
POTASSIUM: 4 meq/L (ref 3.5–5.1)
SODIUM: 141 meq/L (ref 136–145)
TOTAL PROTEIN: 7.3 g/dL (ref 6.4–8.3)
Total Bilirubin: 0.45 mg/dL (ref 0.20–1.20)

## 2013-07-18 LAB — CBC WITH DIFFERENTIAL/PLATELET
BASO%: 0.4 % (ref 0.0–2.0)
Basophils Absolute: 0 10*3/uL (ref 0.0–0.1)
EOS ABS: 0.1 10*3/uL (ref 0.0–0.5)
EOS%: 1.5 % (ref 0.0–7.0)
HCT: 39.4 % (ref 38.4–49.9)
HGB: 12.9 g/dL — ABNORMAL LOW (ref 13.0–17.1)
LYMPH%: 42 % (ref 14.0–49.0)
MCH: 29.1 pg (ref 27.2–33.4)
MCHC: 32.7 g/dL (ref 32.0–36.0)
MCV: 88.9 fL (ref 79.3–98.0)
MONO#: 0.4 10*3/uL (ref 0.1–0.9)
MONO%: 7.4 % (ref 0.0–14.0)
NEUT#: 2.5 10*3/uL (ref 1.5–6.5)
NEUT%: 48.7 % (ref 39.0–75.0)
Platelets: 161 10*3/uL (ref 140–400)
RBC: 4.43 10*6/uL (ref 4.20–5.82)
RDW: 14.2 % (ref 11.0–14.6)
WBC: 5.2 10*3/uL (ref 4.0–10.3)
lymph#: 2.2 10*3/uL (ref 0.9–3.3)

## 2013-07-18 LAB — LACTATE DEHYDROGENASE (CC13): LDH: 207 U/L (ref 125–245)

## 2013-07-20 LAB — KAPPA/LAMBDA LIGHT CHAINS
KAPPA LAMBDA RATIO: 1.21 (ref 0.26–1.65)
Kappa free light chain: 3.16 mg/dL — ABNORMAL HIGH (ref 0.33–1.94)
Lambda Free Lght Chn: 2.61 mg/dL (ref 0.57–2.63)

## 2013-07-20 LAB — IGG, IGA, IGM
IGM, SERUM: 68 mg/dL (ref 41–251)
IgA: 269 mg/dL (ref 68–379)
IgG (Immunoglobin G), Serum: 1100 mg/dL (ref 650–1600)

## 2013-07-20 LAB — BETA 2 MICROGLOBULIN, SERUM: Beta-2 Microglobulin: 2.06 mg/L (ref <=2.51)

## 2013-07-22 ENCOUNTER — Encounter: Payer: Self-pay | Admitting: *Deleted

## 2013-07-22 LAB — UPEP/TP, 24-HR URINE
ALBUMIN UR 24 HR ELECTRO: 75 %
ALPHA-1-GLOBULIN, U: 9.2 %
ALPHA-2-GLOBULIN, U: 5.6 %
BETA GLOBULIN, U: 5.5 %
COLLECTION INTERVAL: 24 h
GAMMA GLOBULIN, U: 4.7 %
Total Protein, Urine/Day: 900 mg/d — ABNORMAL HIGH (ref 50–100)
Total Protein, Urine: 30 mg/dL
Total Volume, Urine: 3000 mL

## 2013-07-22 NOTE — Progress Notes (Signed)
[facility] Psychosocial Distress Screening Clinical Social Work  Clinical Social Work was referred by distress screening protocol.  The patient scored a 6 on the Psychosocial Distress Thermometer which indicates moderate distress. Clinical Social Worker phoned pt to assess for distress and other psychosocial needs. Pt reports to have several friends that have had prostate cancer and found that to be very helpful. He plans to attend the next prostate support group for additional support. He reports to have received medical attention for his pain and denies other concerns currently.   ONCBCN DISTRESS SCREENING 07/14/2013  Screening Type Change in Status  Mark the number that describes how much distress you have been experiencing in the past week 6  Emotional problem type Nervousness/Anxiety;Adjusting to illness  Physical Problem type Pain;Swollen arms/legs  Other pt listed pain as most distressing cause    Clinical Social Worker follow up needed: no  If yes, follow up plan: Pt agrees to seek out CSW support as needed.   Loren Racer, LCSW Clinical Social Worker [facility] Wednesday, Thursday and Friday Phone: [phone] Fax: [phone]

## 2013-07-25 ENCOUNTER — Ambulatory Visit: Payer: 59 | Admitting: Internal Medicine

## 2013-07-26 ENCOUNTER — Ambulatory Visit (HOSPITAL_BASED_OUTPATIENT_CLINIC_OR_DEPARTMENT_OTHER): Payer: 59 | Admitting: Internal Medicine

## 2013-07-26 ENCOUNTER — Encounter: Payer: Self-pay | Admitting: Internal Medicine

## 2013-07-26 ENCOUNTER — Telehealth: Payer: Self-pay | Admitting: Internal Medicine

## 2013-07-26 VITALS — BP 163/84 | HR 57 | Temp 97.0°F | Resp 20 | Ht 75.0 in | Wt 237.8 lb

## 2013-07-26 DIAGNOSIS — R809 Proteinuria, unspecified: Secondary | ICD-10-CM

## 2013-07-26 DIAGNOSIS — D472 Monoclonal gammopathy: Secondary | ICD-10-CM

## 2013-07-26 NOTE — Progress Notes (Signed)
[facility] Telephone:([phone]   Fax:([phone] OFFICE PROGRESS NOTE  Ricky Nettle, MD [place] [address]  DIAGNOSIS: Monoclonal gammopathy of undetermined significance (MGUS).  PRIOR THERAPY: None  CURRENT THERAPY: Observation  INTERVAL HISTORY: Ricky CLAIBORNE Sr. 68 y.o. male returns to the clinic today for followup visit accompanied by his wife. The patient is feeling fine today with no specific complaints, except for occasional arthritis pain especially in the knees. He denied having any significant weakness or fatigue. He continues to have arthralgias. The patient denied having any significant chest pain, shortness of breath, cough or hemoptysis. He has no weight loss or night sweats. He has repeat myeloma panel and he is here today for evaluation and discussion of his lab results.  MEDICAL HISTORY: Past Medical History  Diagnosis Date  . Elevated PSA   . Hypertension   . External carotid artery stenosis     MILD-- BILATERAL  . Borderline diabetes     DIET CONTROLLED  . Gout, arthritis   . History of gastric ulcer     REMOTE HX  . Heart murmur     MILD --  ASYMPTOMATIC  . Hearing loss in left ear   . Wears glasses   . Prostate cancer 12/21/12    Gleason 3+4=7    ALLERGIES:  has No Known Allergies.  MEDICATIONS:  Current Outpatient Prescriptions  Medication Sig Dispense Refill  . allopurinol (ZYLOPRIM) 300 MG tablet Take 300 mg by mouth every evening.      Marland Kitchen amLODipine-valsartan (EXFORGE) 5-320 MG per tablet Take 1 tablet by mouth every evening.      . cephALEXin (KEFLEX) 500 MG capsule Take 500 mg by mouth 4 (four) times daily.      . clobetasol cream (TEMOVATE) 7.37 % Apply 1 application topically as needed (apply to rash on back).       . colchicine 0.6 MG tablet Take 0.6 mg by mouth daily as needed (for gout).       . furosemide (LASIX) 20 MG tablet Take 20 mg by mouth daily.      . metoprolol  (LOPRESSOR) 50 MG tablet Take 50 mg by mouth every evening.       . naproxen sodium (ANAPROX) 220 MG tablet Take by mouth daily as needed.      . saw palmetto 500 MG capsule Take 500 mg by mouth 2 (two) times daily.      . silodosin (RAPAFLO) 8 MG CAPS capsule Take 8 mg by mouth daily with breakfast.       No current facility-administered medications for this visit.    SURGICAL HISTORY:  Past Surgical History  Procedure Laterality Date  . Knee arthroscopy w/ meniscal repair Left JAN 2014  . Lumbar spine surgery  1991  . Prostate biopsy N/A 12/21/2012    Procedure: BIOPSY TRANSRECTAL ULTRASONIC PROSTATE (TUBP);  Surgeon: [name], MD;  Location: [facility];  Service: Urology;  Laterality: N/A;  . Prostate biopsy  11/26/11    benign    REVIEW OF SYSTEMS:  A comprehensive review of systems was negative except for: Musculoskeletal: positive for arthralgias   PHYSICAL EXAMINATION: General appearance: alert, cooperative and no distress Head: Normocephalic, without obvious abnormality, atraumatic Neck: no adenopathy, no JVD, supple, symmetrical, trachea midline and thyroid not enlarged, symmetric, no tenderness/mass/nodules Lymph nodes: Cervical, supraclavicular, and axillary nodes normal. Resp: clear to auscultation bilaterally Back: symmetric, no curvature. ROM normal. No  CVA tenderness. Cardio: regular rate and rhythm, S1, S2 normal, no murmur, click, rub or gallop GI: soft, non-tender; bowel sounds normal; no masses,  no organomegaly Extremities: extremities normal, atraumatic, no cyanosis or edema  ECOG PERFORMANCE STATUS: 1 - Symptomatic but completely ambulatory  Blood pressure 163/84, pulse 57, temperature 97 F (36.1 C), temperature source Oral, resp. rate 20, height 6\' 3"  (1.905 m), weight 237 lb 12.8 oz (107.865 kg).  LABORATORY DATA: Lab Results  Component Value Date   WBC 5.2 07/18/2013   HGB 12.9* 07/18/2013   HCT 39.4 07/18/2013   MCV 88.9  07/18/2013   PLT 161 07/18/2013      Chemistry      Component Value Date/Time   NA 141 07/18/2013 0958   NA 137 12/23/2012 1322   K 4.0 07/18/2013 0958   K 3.9 12/23/2012 1322   CL 102 12/23/2012 1322   CO2 24 07/18/2013 0958   CO2 23 12/23/2012 1322   BUN 23.6 07/18/2013 0958   BUN 21 12/23/2012 1322   CREATININE 1.1 07/18/2013 0958   CREATININE 1.09 12/23/2012 1322      Component Value Date/Time   CALCIUM 9.4 07/18/2013 0958   CALCIUM 8.6 12/23/2012 1322   ALKPHOS 74 07/18/2013 0958   AST 22 07/18/2013 0958   ALT 23 07/18/2013 0958   BILITOT 0.45 07/18/2013 0958     [facility] lab results LTS FINAL DIAGNOSIS ASSESSMENT AND PLAN: This is a very pleasant 68 years old African American male with paraproteinemia questionable for monoclonal gammopathy of undetermined significance (MGUS). His myeloma panel today showed no significant evidence for disease progression. I discussed the lab result with the patient and his wife. I recommended for him to continue on observation with repeat myeloma panel in 3 months. He was advised to call immediately if he has any concerning symptoms in the interval.  The patient voices understanding of current disease status and treatment options and is in agreement with the current care plan.  All questions were answered. The patient knows to call the clinic with any problems, questions or concerns. We can certainly see the patient much sooner if necessary.   Disclaimer: This note was dictated with voice recognition software. Similar sounding words can inadvertently be transcribed and may not be corrected upon review.

## 2013-07-26 NOTE — Telephone Encounter (Signed)
gv and printed appt sched for Aug and lvm for [facility] to sched appt....sent ms gto [facility] and [place] to sent medical records to fax [phone]

## 2013-07-27 ENCOUNTER — Telehealth: Payer: Self-pay | Admitting: Internal Medicine

## 2013-07-27 NOTE — Telephone Encounter (Signed)
FAXED PT MEDICAL RECORDS TO [facility] °

## 2013-08-22 ENCOUNTER — Ambulatory Visit
Admission: RE | Admit: 2013-08-22 | Discharge: 2013-08-22 | Disposition: A | Discharge status: Home / Self Care | Payer: Medicare Other | Source: Ambulatory Visit | Attending: Radiation Oncology | Admitting: Radiation Oncology

## 2013-08-22 DIAGNOSIS — Z8546 Personal history of malignant neoplasm of prostate: Secondary | ICD-10-CM

## 2013-08-22 DIAGNOSIS — Z510 Encounter for antineoplastic radiation therapy: Secondary | ICD-10-CM | POA: Diagnosis not present

## 2013-08-22 NOTE — Progress Notes (Signed)
Complex simulation/treatment planning note: After consent was signed today he was placed upon on the CT simulation table. A VAC LOC immobilization device was constructed. A red rubber tube was placed within the rectal vault. He was then catheterized and contrast instilled into the bladder/urethra. His pelvis was scanned. I chose an isocenter along the central prostate. The CT data set was sent to the MIM planning system where I contoured his prostate, seminal vesicles, rectum, rectosigmoid and bladder. I'm prescribing 7800 cGy to his prostate PTV which represents the prostate was 0.8 cm except for 0.5 cm along the rectum. I prescribing 5600 cGy in 40 sessions to his seminal vesicles which include the seminal vesicles by 0.5 cm. He is now ready for IMRT simulation/treatment planning.

## 2013-08-29 ENCOUNTER — Encounter: Payer: Self-pay | Admitting: Radiation Oncology

## 2013-08-29 DIAGNOSIS — Z510 Encounter for antineoplastic radiation therapy: Secondary | ICD-10-CM | POA: Diagnosis not present

## 2013-08-29 NOTE — Progress Notes (Signed)
IMRT simulation/treatment planning note: The patient completed IMRT simulation/treatment planning in the management of his carcinoma the prostate. IMRT was chosen to decrease the risk for both acute and late bladder and rectal toxicity compared to 3-D conformal or conventional radiation therapy. Dose volume histograms were obtained for the target structures including the prostate and seminal vesicles in addition to avoidance structures including the bladder, rectum, and femoral heads. We met our departmental guidelines. I'm prescribing 7800 cGy to his prostate PTV and 5000 60 cGy to his seminal vesicle PTV in 40 sessions. He is being treated with 2 volume modulated arcs was 6 MV photons. I requesting daily cone beam CT setting up to his 3 gold seeds within the prostate and also treatment with a comfortably full bladder.

## 2013-08-31 ENCOUNTER — Ambulatory Visit
Admission: RE | Admit: 2013-08-31 | Discharge: 2013-08-31 | Disposition: A | Discharge status: Home / Self Care | Payer: Medicare Other | Source: Ambulatory Visit | Attending: Radiation Oncology | Admitting: Radiation Oncology

## 2013-08-31 DIAGNOSIS — Z8546 Personal history of malignant neoplasm of prostate: Secondary | ICD-10-CM

## 2013-08-31 DIAGNOSIS — Z510 Encounter for antineoplastic radiation therapy: Secondary | ICD-10-CM | POA: Diagnosis not present

## 2013-08-31 NOTE — Progress Notes (Signed)
Chart note: The patient began his VMAT IMRT in the management of his carcinoma the prostate. He is being treated with 2 modulated arcs with dynamic MLCs corresponding to one set of IMRT treatment devices (93112).

## 2013-09-01 ENCOUNTER — Ambulatory Visit
Admission: RE | Admit: 2013-09-01 | Discharge: 2013-09-01 | Disposition: A | Discharge status: Home / Self Care | Payer: Medicare Other | Source: Ambulatory Visit | Attending: Radiation Oncology | Admitting: Radiation Oncology

## 2013-09-01 DIAGNOSIS — Z510 Encounter for antineoplastic radiation therapy: Secondary | ICD-10-CM | POA: Diagnosis not present

## 2013-09-02 ENCOUNTER — Ambulatory Visit
Admission: RE | Admit: 2013-09-02 | Discharge: 2013-09-02 | Disposition: A | Discharge status: Home / Self Care | Payer: Medicare Other | Source: Ambulatory Visit | Attending: Radiation Oncology | Admitting: Radiation Oncology

## 2013-09-02 ENCOUNTER — Telehealth: Payer: Self-pay | Admitting: *Deleted

## 2013-09-02 DIAGNOSIS — Z510 Encounter for antineoplastic radiation therapy: Secondary | ICD-10-CM | POA: Diagnosis not present

## 2013-09-02 DIAGNOSIS — C61 Malignant neoplasm of prostate: Secondary | ICD-10-CM

## 2013-09-02 NOTE — Telephone Encounter (Signed)
Spoke w/patient and requested he arrive Mon June 29th by 10:45 am to see Dr Isidore Moos before radiation treatment due to Dr Isidore Moos being involved in a specialty tx on June 29th. Pt verbalized agreement and understanding.

## 2013-09-02 NOTE — Progress Notes (Signed)
Patient education completed w/pt. Gave pt "Radiation and You" booklet w/all pertinent information marked and discussed, re: rectal discomfort/care, fatigue, urinary/bladder irritation/management, nutrition, pain. Pt verbalized understanding.

## 2013-09-05 ENCOUNTER — Ambulatory Visit
Admission: RE | Admit: 2013-09-05 | Discharge: 2013-09-05 | Disposition: A | Discharge status: Home / Self Care | Payer: Medicare Other | Source: Ambulatory Visit | Attending: Radiation Oncology | Admitting: Radiation Oncology

## 2013-09-05 ENCOUNTER — Encounter: Payer: Self-pay | Admitting: Radiation Oncology

## 2013-09-05 VITALS — BP 168/84 | HR 61 | Ht 75.0 in

## 2013-09-05 DIAGNOSIS — Z510 Encounter for antineoplastic radiation therapy: Secondary | ICD-10-CM | POA: Diagnosis not present

## 2013-09-05 DIAGNOSIS — Z8546 Personal history of malignant neoplasm of prostate: Secondary | ICD-10-CM

## 2013-09-05 NOTE — Progress Notes (Signed)
   Weekly Management Note:  outpatient Current Dose:  5.85 Gy  Projected Dose: 78 Gy  prostate  Narrative:  The patient presents for routine under treatment assessment.  CBCT/MVCT images/Port film x-rays were reviewed.  The chart was checked. NAD. Loose stools. No fever or blood in stool  Physical Findings:  height is 6\' 3"  (1.905 m). His blood pressure is 168/84 and his pulse is 61.  NAD  Impression:  The patient is tolerating radiotherapy.  Plan:  Continue radiotherapy as planned. Imodium AD prn loose stools  ________________________________   Eppie Gibson, M.D.

## 2013-09-06 ENCOUNTER — Ambulatory Visit
Admission: RE | Admit: 2013-09-06 | Discharge: 2013-09-06 | Disposition: A | Discharge status: Home / Self Care | Payer: Medicare Other | Source: Ambulatory Visit | Attending: Radiation Oncology | Admitting: Radiation Oncology

## 2013-09-06 DIAGNOSIS — Z510 Encounter for antineoplastic radiation therapy: Secondary | ICD-10-CM | POA: Diagnosis not present

## 2013-09-07 ENCOUNTER — Ambulatory Visit
Admission: RE | Admit: 2013-09-07 | Discharge: 2013-09-07 | Disposition: A | Discharge status: Home / Self Care | Payer: Medicare Other | Source: Ambulatory Visit | Attending: Radiation Oncology | Admitting: Radiation Oncology

## 2013-09-07 DIAGNOSIS — Z510 Encounter for antineoplastic radiation therapy: Secondary | ICD-10-CM | POA: Diagnosis not present

## 2013-09-08 ENCOUNTER — Ambulatory Visit
Admission: RE | Admit: 2013-09-08 | Discharge: 2013-09-08 | Disposition: A | Discharge status: Home / Self Care | Payer: Medicare Other | Source: Ambulatory Visit | Attending: Radiation Oncology | Admitting: Radiation Oncology

## 2013-09-08 DIAGNOSIS — Z510 Encounter for antineoplastic radiation therapy: Secondary | ICD-10-CM | POA: Diagnosis not present

## 2013-09-12 ENCOUNTER — Ambulatory Visit
Admission: RE | Admit: 2013-09-12 | Discharge: 2013-09-12 | Disposition: A | Discharge status: Home / Self Care | Payer: Medicare Other | Source: Ambulatory Visit | Attending: Radiation Oncology | Admitting: Radiation Oncology

## 2013-09-12 VITALS — BP 147/94 | HR 86 | Temp 97.8°F | Resp 12 | Wt 242.3 lb

## 2013-09-12 DIAGNOSIS — Z510 Encounter for antineoplastic radiation therapy: Secondary | ICD-10-CM | POA: Diagnosis not present

## 2013-09-12 DIAGNOSIS — Z8546 Personal history of malignant neoplasm of prostate: Secondary | ICD-10-CM

## 2013-09-12 NOTE — Progress Notes (Signed)
Weekly Management Note:  Site: Prostate Current Dose:  1560 cGy Projected Dose: 7800  cGy  Narrative: The patient is seen today for routine under treatment assessment. CBCT/MVCT images/port films were reviewed. The chart was reviewed.   Filling is satisfactory. No new GU or GI difficulties.  Physical Examination:  Filed Vitals:   09/12/13 1046  BP: 147/94  Pulse: 86  Temp: 97.8 F (36.6 C)  Resp: 12  .  Weight: 242 lb 4.8 oz (109.907 kg). No change.  Impression: Tolerating radiation therapy well.  Plan: Continue radiation therapy as planned.

## 2013-09-12 NOTE — Progress Notes (Signed)
He is currently in no pain. Pt complains of, Fatigue and Generalized Weakness.  Reports-Urgency.  Pt states they urinate 1 - 2 times per night. Denies pain, dribbling or blood.  Pt reports Diarrhea multiple times a day, approx 3 times.  Has purchased Imodium over the counter, but hasn't used it yet.    The patient eats a regular, healthy diet.  Noted approx 5 lb weight gain.

## 2013-09-13 ENCOUNTER — Ambulatory Visit
Admission: RE | Admit: 2013-09-13 | Discharge: 2013-09-13 | Disposition: A | Discharge status: Home / Self Care | Payer: Medicare Other | Source: Ambulatory Visit | Attending: Radiation Oncology | Admitting: Radiation Oncology

## 2013-09-13 DIAGNOSIS — Z510 Encounter for antineoplastic radiation therapy: Secondary | ICD-10-CM | POA: Diagnosis not present

## 2013-09-14 ENCOUNTER — Ambulatory Visit
Admission: RE | Admit: 2013-09-14 | Discharge: 2013-09-14 | Disposition: A | Discharge status: Home / Self Care | Payer: Medicare Other | Source: Ambulatory Visit | Attending: Radiation Oncology | Admitting: Radiation Oncology

## 2013-09-14 DIAGNOSIS — Z510 Encounter for antineoplastic radiation therapy: Secondary | ICD-10-CM | POA: Diagnosis not present

## 2013-09-15 ENCOUNTER — Ambulatory Visit
Admission: RE | Admit: 2013-09-15 | Discharge: 2013-09-15 | Disposition: A | Discharge status: Home / Self Care | Payer: Medicare Other | Source: Ambulatory Visit | Attending: Radiation Oncology | Admitting: Radiation Oncology

## 2013-09-15 DIAGNOSIS — Z510 Encounter for antineoplastic radiation therapy: Secondary | ICD-10-CM | POA: Diagnosis not present

## 2013-09-16 ENCOUNTER — Ambulatory Visit
Admission: RE | Admit: 2013-09-16 | Discharge: 2013-09-16 | Disposition: A | Discharge status: Home / Self Care | Payer: Medicare Other | Source: Ambulatory Visit | Attending: Radiation Oncology | Admitting: Radiation Oncology

## 2013-09-16 DIAGNOSIS — Z510 Encounter for antineoplastic radiation therapy: Secondary | ICD-10-CM | POA: Diagnosis not present

## 2013-09-19 ENCOUNTER — Ambulatory Visit
Admission: RE | Admit: 2013-09-19 | Discharge: 2013-09-19 | Disposition: A | Discharge status: Home / Self Care | Payer: Medicare Other | Source: Ambulatory Visit | Attending: Radiation Oncology | Admitting: Radiation Oncology

## 2013-09-19 ENCOUNTER — Encounter: Payer: Self-pay | Admitting: Radiation Oncology

## 2013-09-19 VITALS — BP 160/85 | HR 59 | Temp 97.9°F | Resp 20 | Wt 237.8 lb

## 2013-09-19 DIAGNOSIS — Z8546 Personal history of malignant neoplasm of prostate: Secondary | ICD-10-CM

## 2013-09-19 DIAGNOSIS — Z510 Encounter for antineoplastic radiation therapy: Secondary | ICD-10-CM | POA: Diagnosis not present

## 2013-09-19 NOTE — Progress Notes (Signed)
Patient reports lower back pain x 2 weeks. He feels it is from straining his back as he gets on/off treatment table. He states he is rolling to his side now and receives assistance from the therapists to get off table. Pt has history of lower back surgery. Pt reports nocturia x 1. He is taking Rapaflo in the morning and Flomax nightly, will discuss this w/Dr [name] today. Pt denies other bladder issues, states bowels have been soft, no other problems reported.

## 2013-09-19 NOTE — Progress Notes (Signed)
Weekly Management Note:  Site: Prostate Current Dose:  2535  cGy Projected Dose: 7800  cGy  Narrative: The patient is seen today for routine under treatment assessment. CBCT/MVCT images/port films were reviewed. The chart was reviewed.   Bladder filling is satisfactory. He is doing well from a GU and GI standpoint. He remains on Rapaflo in the morning and Flomax in the evening. He does have low back pain which has been chronic. He did play 18 holes of golf this past weekend.  Physical Examination:  Filed Vitals:   09/19/13 1050  BP: 160/85  Pulse: 59  Temp: 97.9 F (36.6 C)  Resp: 20  .  Weight: 237 lb 12.8 oz (107.865 kg). No change.  Impression: Tolerating radiation therapy well.  Plan: Continue radiation therapy as planned.

## 2013-09-20 ENCOUNTER — Ambulatory Visit
Admission: RE | Admit: 2013-09-20 | Discharge: 2013-09-20 | Disposition: A | Discharge status: Home / Self Care | Payer: Medicare Other | Source: Ambulatory Visit | Attending: Radiation Oncology | Admitting: Radiation Oncology

## 2013-09-20 DIAGNOSIS — Z510 Encounter for antineoplastic radiation therapy: Secondary | ICD-10-CM | POA: Diagnosis not present

## 2013-09-21 ENCOUNTER — Ambulatory Visit
Admission: RE | Admit: 2013-09-21 | Discharge: 2013-09-21 | Disposition: A | Discharge status: Home / Self Care | Payer: Medicare Other | Source: Ambulatory Visit | Attending: Radiation Oncology | Admitting: Radiation Oncology

## 2013-09-21 DIAGNOSIS — Z510 Encounter for antineoplastic radiation therapy: Secondary | ICD-10-CM | POA: Diagnosis not present

## 2013-09-22 ENCOUNTER — Ambulatory Visit
Admission: RE | Admit: 2013-09-22 | Discharge: 2013-09-22 | Disposition: A | Discharge status: Home / Self Care | Payer: Medicare Other | Source: Ambulatory Visit | Attending: Radiation Oncology | Admitting: Radiation Oncology

## 2013-09-22 DIAGNOSIS — Z510 Encounter for antineoplastic radiation therapy: Secondary | ICD-10-CM | POA: Diagnosis not present

## 2013-09-23 ENCOUNTER — Ambulatory Visit
Admission: RE | Admit: 2013-09-23 | Discharge: 2013-09-23 | Disposition: A | Discharge status: Home / Self Care | Payer: Medicare Other | Source: Ambulatory Visit | Attending: Radiation Oncology | Admitting: Radiation Oncology

## 2013-09-23 DIAGNOSIS — Z510 Encounter for antineoplastic radiation therapy: Secondary | ICD-10-CM | POA: Diagnosis not present

## 2013-09-26 ENCOUNTER — Encounter: Payer: Self-pay | Admitting: Radiation Oncology

## 2013-09-26 ENCOUNTER — Ambulatory Visit
Admission: RE | Admit: 2013-09-26 | Discharge: 2013-09-26 | Disposition: A | Discharge status: Home / Self Care | Payer: Medicare Other | Source: Ambulatory Visit | Attending: Radiation Oncology | Admitting: Radiation Oncology

## 2013-09-26 VITALS — BP 171/85 | HR 61 | Temp 98.2°F | Resp 20 | Wt 240.4 lb

## 2013-09-26 DIAGNOSIS — Z8546 Personal history of malignant neoplasm of prostate: Secondary | ICD-10-CM

## 2013-09-26 DIAGNOSIS — Z510 Encounter for antineoplastic radiation therapy: Secondary | ICD-10-CM | POA: Diagnosis not present

## 2013-09-26 NOTE — Progress Notes (Signed)
   Weekly Management Note:  outpatient  Prostate cancer  Current Dose:  35.1 Gy  Projected Dose: 78 Gy   Narrative:  The patient presents for routine under treatment assessment.  CBCT/MVCT images/Port film x-rays were reviewed.  The chart was checked. No new complaints  Physical Findings:  weight is 240 lb 6.4 oz (109.045 kg). His oral temperature is 98.2 F (36.8 C). His blood pressure is 171/85 and his pulse is 61. His respiration is 20.  well appearing  Impression:  The patient is tolerating radiotherapy.  Plan:  Continue radiotherapy as planned.   ________________________________   Eppie Gibson, M.D.

## 2013-09-26 NOTE — Progress Notes (Signed)
Patient denies pain, fatigue, loss of appetite, urinary/bowel issues. He states he continues to get assistance from RT to get on and off treatment table due to chronic back pain issues.

## 2013-09-27 ENCOUNTER — Ambulatory Visit
Admission: RE | Admit: 2013-09-27 | Discharge: 2013-09-27 | Disposition: A | Discharge status: Home / Self Care | Payer: Medicare Other | Source: Ambulatory Visit | Attending: Radiation Oncology | Admitting: Radiation Oncology

## 2013-09-27 DIAGNOSIS — Z510 Encounter for antineoplastic radiation therapy: Secondary | ICD-10-CM | POA: Diagnosis not present

## 2013-09-28 ENCOUNTER — Ambulatory Visit
Admission: RE | Admit: 2013-09-28 | Discharge: 2013-09-28 | Disposition: A | Discharge status: Home / Self Care | Payer: Medicare Other | Source: Ambulatory Visit | Attending: Radiation Oncology | Admitting: Radiation Oncology

## 2013-09-28 DIAGNOSIS — Z510 Encounter for antineoplastic radiation therapy: Secondary | ICD-10-CM | POA: Diagnosis not present

## 2013-09-29 ENCOUNTER — Ambulatory Visit
Admission: RE | Admit: 2013-09-29 | Discharge: 2013-09-29 | Disposition: A | Discharge status: Home / Self Care | Payer: Medicare Other | Source: Ambulatory Visit | Attending: Radiation Oncology | Admitting: Radiation Oncology

## 2013-09-29 DIAGNOSIS — Z510 Encounter for antineoplastic radiation therapy: Secondary | ICD-10-CM | POA: Diagnosis not present

## 2013-09-30 ENCOUNTER — Ambulatory Visit
Admission: RE | Admit: 2013-09-30 | Discharge: 2013-09-30 | Disposition: A | Discharge status: Home / Self Care | Payer: Medicare Other | Source: Ambulatory Visit | Attending: Radiation Oncology | Admitting: Radiation Oncology

## 2013-09-30 DIAGNOSIS — Z510 Encounter for antineoplastic radiation therapy: Secondary | ICD-10-CM | POA: Diagnosis not present

## 2013-10-03 ENCOUNTER — Encounter: Payer: Self-pay | Admitting: Radiation Oncology

## 2013-10-03 ENCOUNTER — Ambulatory Visit
Admission: RE | Admit: 2013-10-03 | Discharge: 2013-10-03 | Disposition: A | Discharge status: Home / Self Care | Payer: Medicare Other | Source: Ambulatory Visit | Attending: Radiation Oncology | Admitting: Radiation Oncology

## 2013-10-03 VITALS — BP 157/82 | HR 55 | Temp 98.1°F | Resp 20 | Wt 239.6 lb

## 2013-10-03 DIAGNOSIS — Z510 Encounter for antineoplastic radiation therapy: Secondary | ICD-10-CM | POA: Diagnosis not present

## 2013-10-03 DIAGNOSIS — Z8546 Personal history of malignant neoplasm of prostate: Secondary | ICD-10-CM

## 2013-10-03 NOTE — Progress Notes (Signed)
Weekly Management Note:  Site: Prostate Current Dose:  4485  cGy Projected Dose: 7800  cGy  Narrative: The patient is seen today for routine under treatment assessment. CBCT/MVCT images/port films were reviewed. The chart was reviewed.   Bladder filling is satisfactory. He is doing well from a GU standpoint but he has some loosening of his bowels.  Physical Examination:  Filed Vitals:   10/03/13 1035  BP: 157/82  Pulse: 55  Temp: 98.1 F (36.7 C)  Resp: 20  .  Weight: 239 lb 9.6 oz (108.682 kg). No change.  Impression: Tolerating radiation therapy well.  I do not expect radiation to cause loosening of his bowels, and he feels it may be secondary to anxiety. He may take low dose Imodium when necessary.  Plan: Continue radiation therapy as planned.

## 2013-10-03 NOTE — Progress Notes (Addendum)
Patient denies pain, states he is getting adequate assistance getting on and off treatment table, re: his chronic back pain. Pt denies urinary issues, loss of appetite. He is fatigued, states he has 2-3 loose bm's daily. Advised he begin Imodium 1 tab daily if bms are loose and 3 or more daily.  Pt verbalized understanding.

## 2013-10-04 ENCOUNTER — Ambulatory Visit
Admission: RE | Admit: 2013-10-04 | Discharge: 2013-10-04 | Disposition: A | Discharge status: Home / Self Care | Payer: Medicare Other | Source: Ambulatory Visit | Attending: Radiation Oncology | Admitting: Radiation Oncology

## 2013-10-04 DIAGNOSIS — Z510 Encounter for antineoplastic radiation therapy: Secondary | ICD-10-CM | POA: Diagnosis not present

## 2013-10-05 ENCOUNTER — Ambulatory Visit
Admission: RE | Admit: 2013-10-05 | Discharge: 2013-10-05 | Disposition: A | Discharge status: Home / Self Care | Payer: Medicare Other | Source: Ambulatory Visit | Attending: Radiation Oncology | Admitting: Radiation Oncology

## 2013-10-05 DIAGNOSIS — Z510 Encounter for antineoplastic radiation therapy: Secondary | ICD-10-CM | POA: Diagnosis not present

## 2013-10-06 ENCOUNTER — Ambulatory Visit
Admission: RE | Admit: 2013-10-06 | Discharge: 2013-10-06 | Disposition: A | Discharge status: Home / Self Care | Payer: Medicare Other | Source: Ambulatory Visit | Attending: Radiation Oncology | Admitting: Radiation Oncology

## 2013-10-06 DIAGNOSIS — Z510 Encounter for antineoplastic radiation therapy: Secondary | ICD-10-CM | POA: Diagnosis not present

## 2013-10-07 ENCOUNTER — Ambulatory Visit
Admission: RE | Admit: 2013-10-07 | Discharge: 2013-10-07 | Disposition: A | Discharge status: Home / Self Care | Payer: Medicare Other | Source: Ambulatory Visit | Attending: Radiation Oncology | Admitting: Radiation Oncology

## 2013-10-07 DIAGNOSIS — Z510 Encounter for antineoplastic radiation therapy: Secondary | ICD-10-CM | POA: Diagnosis not present

## 2013-10-10 ENCOUNTER — Encounter: Payer: Self-pay | Admitting: Radiation Oncology

## 2013-10-10 ENCOUNTER — Ambulatory Visit
Admission: RE | Admit: 2013-10-10 | Discharge: 2013-10-10 | Disposition: A | Discharge status: Home / Self Care | Payer: Medicare Other | Source: Ambulatory Visit | Attending: Radiation Oncology | Admitting: Radiation Oncology

## 2013-10-10 VITALS — BP 166/80 | HR 55 | Temp 97.8°F | Resp 20 | Wt 239.0 lb

## 2013-10-10 DIAGNOSIS — Z510 Encounter for antineoplastic radiation therapy: Secondary | ICD-10-CM | POA: Diagnosis not present

## 2013-10-10 DIAGNOSIS — Z8546 Personal history of malignant neoplasm of prostate: Secondary | ICD-10-CM

## 2013-10-10 NOTE — Progress Notes (Signed)
Weekly Management Note:  Site: Prostate Current Dose:  5460  cGy Projected Dose: 7800  cGy  Narrative: The patient is seen today for routine under treatment assessment. CBCT/MVCT images/port films were reviewed. The chart was reviewed.   Bladder filling is satisfactory. No GU or GI difficulty.  Physical Examination:  Filed Vitals:   10/10/13 1037  BP: 166/80  Pulse: 55  Temp: 97.8 F (36.6 C)  Resp: 20  .  Weight: 239 lb (108.41 kg). No change.  Impression: Tolerating radiation therapy well.  Plan: Continue radiation therapy as planned.

## 2013-10-10 NOTE — Progress Notes (Signed)
Weekly PUT Progress Note - Prostate  Bladder   denies issues  Bowel states bowels more normal this week  Medications taken: na  Pain Pain Assessment Pain Score: 0-No pain  Fatigue No  Loss of appetite No

## 2013-10-11 ENCOUNTER — Ambulatory Visit
Admission: RE | Admit: 2013-10-11 | Discharge: 2013-10-11 | Disposition: A | Discharge status: Home / Self Care | Payer: Medicare Other | Source: Ambulatory Visit | Attending: Radiation Oncology | Admitting: Radiation Oncology

## 2013-10-11 DIAGNOSIS — Z510 Encounter for antineoplastic radiation therapy: Secondary | ICD-10-CM | POA: Diagnosis not present

## 2013-10-12 ENCOUNTER — Ambulatory Visit
Admission: RE | Admit: 2013-10-12 | Discharge: 2013-10-12 | Disposition: A | Discharge status: Home / Self Care | Payer: Medicare Other | Source: Ambulatory Visit | Attending: Radiation Oncology | Admitting: Radiation Oncology

## 2013-10-12 DIAGNOSIS — C61 Malignant neoplasm of prostate: Secondary | ICD-10-CM | POA: Insufficient documentation

## 2013-10-12 DIAGNOSIS — Z510 Encounter for antineoplastic radiation therapy: Secondary | ICD-10-CM | POA: Insufficient documentation

## 2013-10-13 ENCOUNTER — Ambulatory Visit
Admission: RE | Admit: 2013-10-13 | Discharge: 2013-10-13 | Disposition: A | Discharge status: Home / Self Care | Payer: Medicare Other | Source: Ambulatory Visit | Attending: Radiation Oncology | Admitting: Radiation Oncology

## 2013-10-13 DIAGNOSIS — Z510 Encounter for antineoplastic radiation therapy: Secondary | ICD-10-CM | POA: Diagnosis not present

## 2013-10-14 ENCOUNTER — Ambulatory Visit
Admission: RE | Admit: 2013-10-14 | Discharge: 2013-10-14 | Disposition: A | Discharge status: Home / Self Care | Payer: Medicare Other | Source: Ambulatory Visit | Attending: Radiation Oncology | Admitting: Radiation Oncology

## 2013-10-14 DIAGNOSIS — Z510 Encounter for antineoplastic radiation therapy: Secondary | ICD-10-CM | POA: Diagnosis not present

## 2013-10-17 ENCOUNTER — Encounter: Payer: Self-pay | Admitting: Radiation Oncology

## 2013-10-17 ENCOUNTER — Ambulatory Visit
Admission: RE | Admit: 2013-10-17 | Discharge: 2013-10-17 | Disposition: A | Discharge status: Home / Self Care | Payer: Medicare Other | Source: Ambulatory Visit | Attending: Radiation Oncology | Admitting: Radiation Oncology

## 2013-10-17 VITALS — BP 164/84 | HR 57 | Temp 97.8°F | Resp 20 | Wt 240.1 lb

## 2013-10-17 DIAGNOSIS — Z510 Encounter for antineoplastic radiation therapy: Secondary | ICD-10-CM | POA: Diagnosis not present

## 2013-10-17 DIAGNOSIS — Z8546 Personal history of malignant neoplasm of prostate: Secondary | ICD-10-CM

## 2013-10-17 NOTE — Progress Notes (Signed)
Patient denies pain, loss of appetite, urinary issues. He states he continues to have firm BM in mornings and then 1-2 looser stools during the day, denies diarrhea. He is fatigued.

## 2013-10-17 NOTE — Progress Notes (Signed)
   Weekly Management Note:  outpatient    ICD-9-CM  1. History of prostate cancer V10.46    Current Dose:  64.35 Gy  Projected Dose: 78 Gy   Narrative:  The patient presents for routine under treatment assessment.  CBCT/MVCT images/Port film x-rays were reviewed.  The chart was checked. No new complaints, doing well. Some fatigue, still some loose stool in PM.  Physical Findings:  weight is 240 lb 1.6 oz (108.909 kg). His oral temperature is 97.8 F (36.6 C). His blood pressure is 164/84 and his pulse is 57. His respiration is 20.  NAD  Impression:  The patient is tolerating radiotherapy.  Plan:  Continue radiotherapy as planned.   ________________________________   Eppie Gibson, M.D.

## 2013-10-18 ENCOUNTER — Ambulatory Visit
Admission: RE | Admit: 2013-10-18 | Discharge: 2013-10-18 | Disposition: A | Discharge status: Home / Self Care | Payer: Medicare Other | Source: Ambulatory Visit | Attending: Radiation Oncology | Admitting: Radiation Oncology

## 2013-10-18 DIAGNOSIS — Z510 Encounter for antineoplastic radiation therapy: Secondary | ICD-10-CM | POA: Diagnosis not present

## 2013-10-19 ENCOUNTER — Ambulatory Visit
Admission: RE | Admit: 2013-10-19 | Discharge: 2013-10-19 | Disposition: A | Discharge status: Home / Self Care | Payer: Medicare Other | Source: Ambulatory Visit | Attending: Radiation Oncology | Admitting: Radiation Oncology

## 2013-10-19 DIAGNOSIS — Z510 Encounter for antineoplastic radiation therapy: Secondary | ICD-10-CM | POA: Diagnosis not present

## 2013-10-20 ENCOUNTER — Ambulatory Visit
Admission: RE | Admit: 2013-10-20 | Discharge: 2013-10-20 | Disposition: A | Discharge status: Home / Self Care | Payer: Medicare Other | Source: Ambulatory Visit | Attending: Radiation Oncology | Admitting: Radiation Oncology

## 2013-10-20 DIAGNOSIS — Z510 Encounter for antineoplastic radiation therapy: Secondary | ICD-10-CM | POA: Diagnosis not present

## 2013-10-21 ENCOUNTER — Ambulatory Visit
Admission: RE | Admit: 2013-10-21 | Discharge: 2013-10-21 | Disposition: A | Discharge status: Home / Self Care | Payer: Medicare Other | Source: Ambulatory Visit | Attending: Radiation Oncology | Admitting: Radiation Oncology

## 2013-10-21 DIAGNOSIS — Z510 Encounter for antineoplastic radiation therapy: Secondary | ICD-10-CM | POA: Diagnosis not present

## 2013-10-24 ENCOUNTER — Ambulatory Visit
Admission: RE | Admit: 2013-10-24 | Discharge: 2013-10-24 | Disposition: A | Discharge status: Home / Self Care | Payer: Medicare Other | Source: Ambulatory Visit | Attending: Radiation Oncology | Admitting: Radiation Oncology

## 2013-10-24 ENCOUNTER — Encounter: Payer: Self-pay | Admitting: Radiation Oncology

## 2013-10-24 ENCOUNTER — Other Ambulatory Visit (HOSPITAL_BASED_OUTPATIENT_CLINIC_OR_DEPARTMENT_OTHER): Payer: 59

## 2013-10-24 VITALS — BP 170/82 | HR 60 | Temp 98.2°F | Resp 20 | Wt 238.6 lb

## 2013-10-24 DIAGNOSIS — R809 Proteinuria, unspecified: Secondary | ICD-10-CM

## 2013-10-24 DIAGNOSIS — Z8546 Personal history of malignant neoplasm of prostate: Secondary | ICD-10-CM

## 2013-10-24 DIAGNOSIS — Z510 Encounter for antineoplastic radiation therapy: Secondary | ICD-10-CM | POA: Diagnosis not present

## 2013-10-24 DIAGNOSIS — D472 Monoclonal gammopathy: Secondary | ICD-10-CM

## 2013-10-24 LAB — CBC WITH DIFFERENTIAL/PLATELET
BASO%: 0.3 % (ref 0.0–2.0)
Basophils Absolute: 0 10*3/uL (ref 0.0–0.1)
EOS%: 1.8 % (ref 0.0–7.0)
Eosinophils Absolute: 0.1 10*3/uL (ref 0.0–0.5)
HCT: 39.5 % (ref 38.4–49.9)
HGB: 13.2 g/dL (ref 13.0–17.1)
LYMPH#: 1 10*3/uL (ref 0.9–3.3)
LYMPH%: 24.6 % (ref 14.0–49.0)
MCH: 29.9 pg (ref 27.2–33.4)
MCHC: 33.4 g/dL (ref 32.0–36.0)
MCV: 89.6 fL (ref 79.3–98.0)
MONO#: 0.4 10*3/uL (ref 0.1–0.9)
MONO%: 10.5 % (ref 0.0–14.0)
NEUT#: 2.5 10*3/uL (ref 1.5–6.5)
NEUT%: 62.8 % (ref 39.0–75.0)
PLATELETS: 145 10*3/uL (ref 140–400)
RBC: 4.41 10*6/uL (ref 4.20–5.82)
RDW: 14.4 % (ref 11.0–14.6)
WBC: 3.9 10*3/uL — AB (ref 4.0–10.3)

## 2013-10-24 LAB — COMPREHENSIVE METABOLIC PANEL (CC13)
ALT: 23 U/L (ref 0–55)
ANION GAP: 8 meq/L (ref 3–11)
AST: 28 U/L (ref 5–34)
Albumin: 3.8 g/dL (ref 3.5–5.0)
Alkaline Phosphatase: 85 U/L (ref 40–150)
BUN: 17.8 mg/dL (ref 7.0–26.0)
CO2: 25 meq/L (ref 22–29)
Calcium: 9.4 mg/dL (ref 8.4–10.4)
Chloride: 104 mEq/L (ref 98–109)
Creatinine: 1.2 mg/dL (ref 0.7–1.3)
Glucose: 109 mg/dl (ref 70–140)
Potassium: 4.1 mEq/L (ref 3.5–5.1)
Sodium: 137 mEq/L (ref 136–145)
TOTAL PROTEIN: 7.4 g/dL (ref 6.4–8.3)
Total Bilirubin: 0.64 mg/dL (ref 0.20–1.20)

## 2013-10-24 LAB — LACTATE DEHYDROGENASE (CC13): LDH: 226 U/L (ref 125–245)

## 2013-10-24 NOTE — Progress Notes (Signed)
Weekly Management Note:  Site: Prostate Current Dose:  7410  cGy Projected Dose: 7800  cGy  Narrative: The patient is seen today for routine under treatment assessment. CBCT/MVCT images/port films were reviewed. The chart was reviewed.   Bladder filling is satisfactory. No significant GU or GI difficulty.  Physical Examination:  Filed Vitals:   10/24/13 1026  BP: 170/82  Pulse: 60  Temp: 98.2 F (36.8 C)  Resp: 20  .  Weight: 238 lb 9.6 oz (108.228 kg). No change.  Impression: Tolerating radiation therapy well. He'll finish his radiation therapy this Wednesday.  Plan: Continue radiation therapy as planned. One-month followup after completion of radiation therapy.

## 2013-10-24 NOTE — Progress Notes (Signed)
Patient reports ongoing fatigue, loose bm's, denies diarrhea. He denies pain, urinary issues, loss of appetite. Pt will complete Wed, gave him 1 month FU card.

## 2013-10-25 ENCOUNTER — Ambulatory Visit
Admission: RE | Admit: 2013-10-25 | Discharge: 2013-10-25 | Disposition: A | Discharge status: Home / Self Care | Payer: Medicare Other | Source: Ambulatory Visit | Attending: Radiation Oncology | Admitting: Radiation Oncology

## 2013-10-25 DIAGNOSIS — Z510 Encounter for antineoplastic radiation therapy: Secondary | ICD-10-CM | POA: Diagnosis not present

## 2013-10-26 ENCOUNTER — Ambulatory Visit
Admission: RE | Admit: 2013-10-26 | Discharge: 2013-10-26 | Disposition: A | Discharge status: Home / Self Care | Payer: Medicare Other | Source: Ambulatory Visit | Attending: Radiation Oncology | Admitting: Radiation Oncology

## 2013-10-26 DIAGNOSIS — Z510 Encounter for antineoplastic radiation therapy: Secondary | ICD-10-CM | POA: Diagnosis not present

## 2013-10-26 LAB — IGG, IGA, IGM
IGM, SERUM: 66 mg/dL (ref 41–251)
IgA: 282 mg/dL (ref 68–379)
IgG (Immunoglobin G), Serum: 1140 mg/dL (ref 650–1600)

## 2013-10-26 LAB — KAPPA/LAMBDA LIGHT CHAINS
KAPPA LAMBDA RATIO: 1.22 (ref 0.26–1.65)
Kappa free light chain: 3.21 mg/dL — ABNORMAL HIGH (ref 0.33–1.94)
Lambda Free Lght Chn: 2.64 mg/dL — ABNORMAL HIGH (ref 0.57–2.63)

## 2013-10-26 LAB — BETA 2 MICROGLOBULIN, SERUM: Beta-2 Microglobulin: 2.24 mg/L (ref <=2.51)

## 2013-10-27 ENCOUNTER — Encounter: Payer: Self-pay | Admitting: Radiation Oncology

## 2013-10-27 NOTE — Progress Notes (Signed)
[facility] End of Treatment Note  Name:Ricky Rowland.  Date: 10/27/2013 NIO:[id number] DOB:02/04/1946   Status:outpatient    CC: [name], MD  Dr. Lowella Bandy  REFERRING PHYSICIAN: Dr. Lowella Bandy     DIAGNOSIS: Stage TI C. intermediate risk adenocarcinoma prostate   INDICATION FOR TREATMENT: Curative   TREATMENT DATES: 08/31/2013 through 10/26/2013                          SITE/DOSE: Prostate 7800 cGy in 40 sessions, seminal vesicles 5600 cGy in 40 sessions                           BEAMS/ENERGY: 6 MV photons, dual ARC VMAT IMRT                  NARRATIVE:   [name] tolerated treatment well with no significant GU or GI toxicity during his course of therapy.                         PLAN: Routine followup in one month. Patient instructed to call if questions or worsening complaints in interim.

## 2013-10-31 ENCOUNTER — Ambulatory Visit (HOSPITAL_BASED_OUTPATIENT_CLINIC_OR_DEPARTMENT_OTHER): Payer: 59 | Admitting: Internal Medicine

## 2013-10-31 VITALS — BP 138/70 | HR 57 | Temp 98.0°F | Resp 18 | Ht 75.0 in | Wt 235.4 lb

## 2013-10-31 DIAGNOSIS — IMO0001 Reserved for inherently not codable concepts without codable children: Secondary | ICD-10-CM

## 2013-10-31 DIAGNOSIS — Z8546 Personal history of malignant neoplasm of prostate: Secondary | ICD-10-CM

## 2013-10-31 DIAGNOSIS — D472 Monoclonal gammopathy: Secondary | ICD-10-CM

## 2013-10-31 DIAGNOSIS — C61 Malignant neoplasm of prostate: Secondary | ICD-10-CM

## 2013-10-31 NOTE — Progress Notes (Signed)
[facility] Telephone:(336) [phone]   Fax:(336) [phone] OFFICE PROGRESS NOTE  [name], MD [place] [address]  DIAGNOSIS:  1) Monoclonal gammopathy of undetermined significance (MGUS). 2) History of prostate cancer T1C followed by Dr. [name],  status post curative radiotherapy under the care of Dr. [name].  PRIOR THERAPY: None  CURRENT THERAPY: Observation.  INTERVAL HISTORY: Ricky Rowland. 68 y.o. male returns to the clinic today for followup visit. He is currently undergoing radiotherapy to the prostate cancer under the care of Dr. [name]. The patient is feeling fine today with no specific complaints. He denied having any significant weakness or fatigue. He continues to have arthralgias. The patient denied having any significant chest pain, shortness of breath, cough or hemoptysis. He has no weight loss or night sweats. He has repeat myeloma panel and he is here today for evaluation and discussion of his lab results.  MEDICAL HISTORY: Past Medical History  Diagnosis Date  . Elevated PSA   . Hypertension   . External carotid artery stenosis     MILD-- BILATERAL  . Borderline diabetes     DIET CONTROLLED  . Gout, arthritis   . History of gastric ulcer     REMOTE HX  . Heart murmur     MILD --  ASYMPTOMATIC  . Hearing loss in left ear   . Wears glasses   . Prostate cancer 12/21/12    Gleason 3+4=7    ALLERGIES:  has No Known Allergies.  MEDICATIONS:  Current Outpatient Prescriptions  Medication Sig Dispense Refill  . allopurinol (ZYLOPRIM) 300 MG tablet Take 300 mg by mouth every evening.      Marland Kitchen amLODipine-valsartan (EXFORGE) 5-320 MG per tablet Take 1 tablet by mouth every evening.      . colchicine 0.6 MG tablet Take 0.6 mg by mouth daily as needed (for gout).       . furosemide (LASIX) 20 MG tablet Take 20 mg by mouth daily.      . metoprolol (LOPRESSOR) 50 MG tablet Take 50 mg by mouth every evening.         . silodosin (RAPAFLO) 8 MG CAPS capsule Take 8 mg by mouth daily with breakfast.      . tamsulosin (FLOMAX) 0.4 MG CAPS capsule       . clobetasol cream (TEMOVATE) 4.27 % Apply 1 application topically as needed (apply to rash on back).        No current facility-administered medications for this visit.    SURGICAL HISTORY:  Past Surgical History  Procedure Laterality Date  . Knee arthroscopy w/ meniscal repair Left JAN 2014  . Lumbar spine surgery  1991  . Prostate biopsy N/A 12/21/2012    Procedure: BIOPSY TRANSRECTAL ULTRASONIC PROSTATE (TUBP);  Surgeon: [name], MD;  Location: [facility];  Service: Urology;  Laterality: N/A;  . Prostate biopsy  11/26/11    benign    REVIEW OF SYSTEMS:  A comprehensive review of systems was negative except for: Musculoskeletal: positive for arthralgias   PHYSICAL EXAMINATION: General appearance: alert, cooperative and no distress Head: Normocephalic, without obvious abnormality, atraumatic Neck: no adenopathy, no JVD, supple, symmetrical, trachea midline and thyroid not enlarged, symmetric, no tenderness/mass/nodules Lymph nodes: Cervical, supraclavicular, and axillary nodes normal. Resp: clear to auscultation bilaterally Back: symmetric, no curvature. ROM normal. No CVA tenderness. Cardio: regular rate and rhythm, S1, S2 normal, no murmur, click, rub or gallop GI: soft, non-tender;  bowel sounds normal; no masses,  no organomegaly Extremities: extremities normal, atraumatic, no cyanosis or edema  ECOG PERFORMANCE STATUS: 1 - Symptomatic but completely ambulatory  Blood pressure 138/70, pulse 57, temperature 98 F (36.7 C), temperature source Oral, resp. rate 18, height 6\' 3"  (1.905 m), weight 235 lb 6.4 oz (106.777 kg).  LABORATORY DATA: Lab Results  Component Value Date   WBC 3.9* 10/24/2013   HGB 13.2 10/24/2013   HCT 39.5 10/24/2013   MCV 89.6 10/24/2013   PLT 145 10/24/2013      Chemistry      Component Value  Date/Time   NA 137 10/24/2013 1057   NA 137 12/23/2012 1322   K 4.1 10/24/2013 1057   K 3.9 12/23/2012 1322   CL 102 12/23/2012 1322   CO2 25 10/24/2013 1057   CO2 23 12/23/2012 1322   BUN 17.8 10/24/2013 1057   BUN 21 12/23/2012 1322   CREATININE 1.2 10/24/2013 1057   CREATININE 1.09 12/23/2012 1322      Component Value Date/Time   CALCIUM 9.4 10/24/2013 1057   CALCIUM 8.6 12/23/2012 1322   ALKPHOS 85 10/24/2013 1057   AST 28 10/24/2013 1057   ALT 23 10/24/2013 1057   BILITOT 0.64 10/24/2013 1057     [facility] lab results LTS FINAL DIAGNOSIS ASSESSMENT AND PLAN: This is a very pleasant 68 years old African American male with:  1) monoclonal gammopathy of undetermined significance (MGUS). His myeloma panel today showed no significant evidence for disease progression.  I discussed the lab result with the patient. I recommended for him to continue on observation with repeat myeloma panel in 3 months.  2) T1C prostate adenocarcinoma: Currently undergoing definitive radiotherapy under the care of Dr. [name]. He is followed by Dr. [name].  He was advised to call immediately if he has any concerning symptoms in the interval.  The patient voices understanding of current disease status and treatment options and is in agreement with the current care plan.  All questions were answered. The patient knows to call the clinic with any problems, questions or concerns. We can certainly see the patient much sooner if necessary.   Disclaimer: This note was dictated with voice recognition software. Similar sounding words can inadvertently be transcribed and may not be corrected upon review.

## 2013-11-01 ENCOUNTER — Telehealth: Payer: Self-pay | Admitting: Internal Medicine

## 2013-11-01 NOTE — Telephone Encounter (Signed)
s.w. pt and advised on Feb 2016 appt....pt ok adn aware °

## 2013-11-29 ENCOUNTER — Ambulatory Visit
Admission: RE | Admit: 2013-11-29 | Discharge: 2013-11-29 | Disposition: A | Discharge status: Home / Self Care | Payer: 59 | Source: Ambulatory Visit | Attending: Radiation Oncology | Admitting: Radiation Oncology

## 2013-11-29 ENCOUNTER — Encounter: Payer: Self-pay | Admitting: Radiation Oncology

## 2013-11-29 VITALS — BP 159/84 | HR 55 | Temp 98.3°F | Resp 20 | Wt 232.1 lb

## 2013-11-29 DIAGNOSIS — Z8546 Personal history of malignant neoplasm of prostate: Secondary | ICD-10-CM

## 2013-11-29 NOTE — Progress Notes (Signed)
Patient denies pain, urinary or bowel issues, loss of appetite. He states his energy level has returned to his normal. He states he is seeing Dr [facility] for his kidney, was referred by Dr Julien Nordmann.

## 2013-11-29 NOTE — Progress Notes (Signed)
CC: Dr. Lowella Bandy  Followup note:  [name] returns today approximately 1 month following completion of external beam/IMRT in the management of his stage TI C. intermediate risk adenocarcinoma prostate. He is without GU or GI difficulties. He has not yet have an appointment to see Dr. [name]. He will see Dr. Earlie Server again in February 2016 for followup of his monoclonal protein.  Physical examination: Alert and oriented. Filed Vitals:   11/29/13 1023  BP: 159/84  Pulse: 55  Temp: 98.3 F (36.8 C)  Resp: 20   Rectal examination not performed today.  Impression: Satisfactory progress  Plan: I suspect that Dr. [name] will see him in approximately one to 2 months for a followup visit and PSA determination. I've not scheduled [name] for formal followup visit with me and I ask that Dr. [name] keep me posted on his progress.

## 2014-01-13 ENCOUNTER — Other Ambulatory Visit (HOSPITAL_COMMUNITY): Payer: Self-pay | Admitting: Internal Medicine

## 2014-01-13 DIAGNOSIS — R1011 Right upper quadrant pain: Secondary | ICD-10-CM

## 2014-01-17 ENCOUNTER — Other Ambulatory Visit (HOSPITAL_COMMUNITY): Payer: Self-pay | Admitting: Internal Medicine

## 2014-01-17 ENCOUNTER — Ambulatory Visit (HOSPITAL_COMMUNITY)
Admission: RE | Admit: 2014-01-17 | Discharge: 2014-01-17 | Disposition: A | Discharge status: Home / Self Care | Payer: Medicare Other | Source: Ambulatory Visit | Attending: Diagnostic Radiology | Admitting: Diagnostic Radiology

## 2014-01-17 DIAGNOSIS — K760 Fatty (change of) liver, not elsewhere classified: Secondary | ICD-10-CM | POA: Diagnosis not present

## 2014-01-17 DIAGNOSIS — R1011 Right upper quadrant pain: Secondary | ICD-10-CM

## 2014-05-01 ENCOUNTER — Other Ambulatory Visit: Payer: 59

## 2014-05-05 ENCOUNTER — Telehealth: Payer: Self-pay | Admitting: Internal Medicine

## 2014-05-05 ENCOUNTER — Ambulatory Visit (HOSPITAL_BASED_OUTPATIENT_CLINIC_OR_DEPARTMENT_OTHER): Payer: Medicare Other

## 2014-05-05 DIAGNOSIS — D472 Monoclonal gammopathy: Secondary | ICD-10-CM

## 2014-05-05 DIAGNOSIS — Z8546 Personal history of malignant neoplasm of prostate: Secondary | ICD-10-CM

## 2014-05-05 LAB — COMPREHENSIVE METABOLIC PANEL (CC13)
ALT: 32 U/L (ref 0–55)
AST: 32 U/L (ref 5–34)
Albumin: 3.9 g/dL (ref 3.5–5.0)
Alkaline Phosphatase: 82 U/L (ref 40–150)
Anion Gap: 10 mEq/L (ref 3–11)
BUN: 24.4 mg/dL (ref 7.0–26.0)
CO2: 25 mEq/L (ref 22–29)
Calcium: 9.4 mg/dL (ref 8.4–10.4)
Chloride: 106 mEq/L (ref 98–109)
Creatinine: 1.4 mg/dL — ABNORMAL HIGH (ref 0.7–1.3)
EGFR: 59 mL/min/{1.73_m2} — AB (ref >90)
Glucose: 125 mg/dl (ref 70–140)
POTASSIUM: 4.1 meq/L (ref 3.5–5.1)
SODIUM: 141 meq/L (ref 136–145)
TOTAL PROTEIN: 7.1 g/dL (ref 6.4–8.3)
Total Bilirubin: 0.72 mg/dL (ref 0.20–1.20)

## 2014-05-05 LAB — CBC WITH DIFFERENTIAL/PLATELET
BASO%: 0.6 % (ref 0.0–2.0)
Basophils Absolute: 0 10*3/uL (ref 0.0–0.1)
EOS%: 1.6 % (ref 0.0–7.0)
Eosinophils Absolute: 0.1 10*3/uL (ref 0.0–0.5)
HCT: 40.8 % (ref 38.4–49.9)
HGB: 13.1 g/dL (ref 13.0–17.1)
LYMPH%: 29.3 % (ref 14.0–49.0)
MCH: 30.5 pg (ref 27.2–33.4)
MCHC: 32.2 g/dL (ref 32.0–36.0)
MCV: 94.8 fL (ref 79.3–98.0)
MONO#: 0.6 10*3/uL (ref 0.1–0.9)
MONO%: 10.8 % (ref 0.0–14.0)
NEUT%: 57.7 % (ref 39.0–75.0)
NEUTROS ABS: 3 10*3/uL (ref 1.5–6.5)
Platelets: 152 10*3/uL (ref 140–400)
RBC: 4.3 10*6/uL (ref 4.20–5.82)
RDW: 14.2 % (ref 11.0–14.6)
WBC: 5.2 10*3/uL (ref 4.0–10.3)
lymph#: 1.5 10*3/uL (ref 0.9–3.3)

## 2014-05-05 LAB — LACTATE DEHYDROGENASE (CC13): LDH: 212 U/L (ref 125–245)

## 2014-05-05 NOTE — Telephone Encounter (Signed)
returned pt call and s.w. pt and r/s missed lab for today....pt ok adn aware

## 2014-05-08 ENCOUNTER — Ambulatory Visit: Payer: 59 | Admitting: Internal Medicine

## 2014-05-08 LAB — KAPPA/LAMBDA LIGHT CHAINS
KAPPA LAMBDA RATIO: 1.57 (ref 0.26–1.65)
Kappa free light chain: 4.81 mg/dL — ABNORMAL HIGH (ref 0.33–1.94)
Lambda Free Lght Chn: 3.06 mg/dL — ABNORMAL HIGH (ref 0.57–2.63)

## 2014-05-08 LAB — BETA 2 MICROGLOBULIN, SERUM: BETA 2 MICROGLOBULIN: 2.38 mg/L (ref <=2.51)

## 2014-05-08 LAB — IGG, IGA, IGM
IgA: 288 mg/dL (ref 68–379)
IgG (Immunoglobin G), Serum: 1180 mg/dL (ref 650–1600)
IgM, Serum: 73 mg/dL (ref 41–251)

## 2014-05-09 ENCOUNTER — Telehealth: Payer: Self-pay | Admitting: Internal Medicine

## 2014-05-09 NOTE — Telephone Encounter (Signed)
pt called to r/s missed appt.....pt ok and aware °

## 2014-05-30 ENCOUNTER — Telehealth: Payer: Self-pay | Admitting: Internal Medicine

## 2014-05-30 ENCOUNTER — Encounter: Payer: Self-pay | Admitting: Internal Medicine

## 2014-05-30 ENCOUNTER — Ambulatory Visit (HOSPITAL_BASED_OUTPATIENT_CLINIC_OR_DEPARTMENT_OTHER): Payer: Medicare Other | Admitting: Internal Medicine

## 2014-05-30 VITALS — BP 136/74 | HR 65 | Temp 98.4°F | Resp 18 | Ht 75.0 in | Wt 237.2 lb

## 2014-05-30 DIAGNOSIS — D472 Monoclonal gammopathy: Secondary | ICD-10-CM | POA: Diagnosis not present

## 2014-05-30 DIAGNOSIS — Z8546 Personal history of malignant neoplasm of prostate: Secondary | ICD-10-CM | POA: Diagnosis not present

## 2014-05-30 NOTE — Progress Notes (Signed)
[facility] [phone]   [phone] OFFICE PROGRESS NOTE  [name], MD [place] [address]  DIAGNOSIS:  1) Monoclonal gammopathy of undetermined significance (MGUS). 2) History of prostate cancer T1C followed by Dr. [name],  status post curative radiotherapy under the care of Dr. [name].  PRIOR THERAPY: Curative radiotherapy to the prostate under the care of Dr. [name].  CURRENT THERAPY: Observation.  INTERVAL HISTORY: LAURIE LOVEJOY Sr. 69 y.o. male returns to the clinic today for 6 months followup visit. The patient is feeling fine today with no specific complaints. He denied having any significant weakness or fatigue. The patient denied having any significant chest pain, shortness of breath, cough or hemoptysis. He has no weight loss or night sweats. He has repeat myeloma panel and he is here today for evaluation and discussion of his lab results.  MEDICAL HISTORY: Past Medical History  Diagnosis Date  . Elevated PSA   . Hypertension   . External carotid artery stenosis     MILD-- BILATERAL  . Borderline diabetes     DIET CONTROLLED  . Gout, arthritis   . History of gastric ulcer     REMOTE HX  . Heart murmur     MILD --  ASYMPTOMATIC  . Hearing loss in left ear   . Wears glasses   . Prostate cancer 12/21/12    Gleason 3+4=7    ALLERGIES:  has No Known Allergies.  MEDICATIONS:  Current Outpatient Prescriptions  Medication Sig Dispense Refill  . allopurinol (ZYLOPRIM) 300 MG tablet Take 300 mg by mouth every evening.    Marland Kitchen amLODipine-valsartan (EXFORGE) 5-320 MG per tablet Take 1 tablet by mouth every evening.    . chlorthalidone (HYGROTON) 25 MG tablet     . clobetasol cream (TEMOVATE) 9.56 % Apply 1 application topically as needed (apply to rash on back).     . colchicine 0.6 MG tablet Take 0.6 mg by mouth daily as needed (for gout).     . metoprolol (LOPRESSOR) 50 MG tablet Take 50 mg by  mouth every evening.     Marland Kitchen omeprazole (PRILOSEC) 20 MG capsule Take 20 mg by mouth daily.  0  . tamsulosin (FLOMAX) 0.4 MG CAPS capsule     . ZETIA 10 MG tablet Take 10 mg by mouth daily.  0  . ALPRAZolam (XANAX) 0.25 MG tablet Take 0.25 mg by mouth daily as needed.  0   No current facility-administered medications for this visit.    SURGICAL HISTORY:  Past Surgical History  Procedure Laterality Date  . Knee arthroscopy w/ meniscal repair Left JAN 2014  . Lumbar spine surgery  1991  . Prostate biopsy N/A 12/21/2012    Procedure: BIOPSY TRANSRECTAL ULTRASONIC PROSTATE (TUBP);  Surgeon: [name], MD;  Location: [facility];  Service: Urology;  Laterality: N/A;  . Prostate biopsy  11/26/11    benign    REVIEW OF SYSTEMS:  A comprehensive review of systems was negative.   PHYSICAL EXAMINATION: General appearance: alert, cooperative and no distress Head: Normocephalic, without obvious abnormality, atraumatic Neck: no adenopathy, no JVD, supple, symmetrical, trachea midline and thyroid not enlarged, symmetric, no tenderness/mass/nodules Lymph nodes: Cervical, supraclavicular, and axillary nodes normal. Resp: clear to auscultation bilaterally Back: symmetric, no curvature. ROM normal. No CVA tenderness. Cardio: regular rate and rhythm, S1, S2 normal, no murmur, click, rub or gallop GI: soft, non-tender; bowel sounds normal; no masses,  no organomegaly  Extremities: extremities normal, atraumatic, no cyanosis or edema  ECOG PERFORMANCE STATUS: 1 - Symptomatic but completely ambulatory  Blood pressure 136/74, pulse 65, temperature 98.4 F (36.9 C), temperature source Oral, resp. rate 18, height 6\' 3"  (1.905 m), weight 237 lb 3.2 oz (107.593 kg), SpO2 99 %.  LABORATORY DATA: Lab Results  Component Value Date   WBC 5.2 05/05/2014   HGB 13.1 05/05/2014   HCT 40.8 05/05/2014   MCV 94.8 05/05/2014   PLT 152 05/05/2014      Chemistry      Component Value  Date/Time   NA 141 05/05/2014 1343   NA 137 12/23/2012 1322   K 4.1 05/05/2014 1343   K 3.9 12/23/2012 1322   CL 102 12/23/2012 1322   CO2 25 05/05/2014 1343   CO2 23 12/23/2012 1322   BUN 24.4 05/05/2014 1343   BUN 21 12/23/2012 1322   CREATININE 1.4* 05/05/2014 1343   CREATININE 1.09 12/23/2012 1322      Component Value Date/Time   CALCIUM 9.4 05/05/2014 1343   CALCIUM 8.6 12/23/2012 1322   ALKPHOS 82 05/05/2014 1343   AST 32 05/05/2014 1343   ALT 32 05/05/2014 1343   BILITOT 0.72 05/05/2014 1343     [facility] lab results LTS FINAL DIAGNOSIS ASSESSMENT AND PLAN: This is a very pleasant 69 years old African American male with:  1) monoclonal gammopathy of undetermined significance (MGUS). His myeloma panel today showed no significant evidence for disease progression.  I discussed the lab result with the patient. I recommended for him to continue on observation with repeat myeloma panel in one year.  2) T1C prostate adenocarcinoma: He completed a course of curative radiotherapy under the care of Dr. [name] and his PSA is declining. He will continue on observation. He was advised to call immediately if he has any concerning symptoms in the interval.  The patient voices understanding of current disease status and treatment options and is in agreement with the current care plan.  All questions were answered. The patient knows to call the clinic with any problems, questions or concerns. We can certainly see the patient much sooner if necessary.   Disclaimer: This note was dictated with voice recognition software. Similar sounding words can inadvertently be transcribed and may not be corrected upon review.

## 2014-05-30 NOTE — Telephone Encounter (Signed)
spoke with patient and advised on March 2017 appt.Marland KitchenMarland KitchenMarland KitchenMarland Kitchenpatient ok and aware

## 2014-09-04 ENCOUNTER — Other Ambulatory Visit: Payer: Self-pay

## 2014-10-10 ENCOUNTER — Ambulatory Visit
Admission: RE | Admit: 2014-10-10 | Discharge: 2014-10-10 | Disposition: A | Discharge status: Home / Self Care | Payer: Medicare Other | Source: Ambulatory Visit | Attending: Internal Medicine | Admitting: Internal Medicine

## 2014-10-10 ENCOUNTER — Other Ambulatory Visit: Payer: Self-pay | Admitting: Internal Medicine

## 2014-10-10 DIAGNOSIS — K5909 Other constipation: Secondary | ICD-10-CM

## 2015-01-18 ENCOUNTER — Other Ambulatory Visit: Payer: Self-pay | Admitting: Gastroenterology

## 2015-01-18 DIAGNOSIS — R112 Nausea with vomiting, unspecified: Secondary | ICD-10-CM

## 2015-01-24 ENCOUNTER — Other Ambulatory Visit: Payer: Self-pay | Admitting: Internal Medicine

## 2015-01-24 DIAGNOSIS — R42 Dizziness and giddiness: Secondary | ICD-10-CM

## 2015-01-25 ENCOUNTER — Ambulatory Visit
Admission: RE | Admit: 2015-01-25 | Discharge: 2015-01-25 | Disposition: A | Discharge status: Home / Self Care | Payer: Medicare Other | Source: Ambulatory Visit | Attending: Gastroenterology | Admitting: Gastroenterology

## 2015-01-25 DIAGNOSIS — R112 Nausea with vomiting, unspecified: Secondary | ICD-10-CM

## 2015-02-03 ENCOUNTER — Ambulatory Visit
Admission: RE | Admit: 2015-02-03 | Discharge: 2015-02-03 | Disposition: A | Discharge status: Home / Self Care | Payer: Medicare Other | Source: Ambulatory Visit | Attending: Internal Medicine | Admitting: Internal Medicine

## 2015-02-03 DIAGNOSIS — R42 Dizziness and giddiness: Secondary | ICD-10-CM

## 2015-02-03 MED ORDER — GADOBENATE DIMEGLUMINE 529 MG/ML IV SOLN
20.0000 mL | Freq: Once | INTRAVENOUS | Status: AC | PRN
  Administered 2015-02-03: 20 mL via INTRAVENOUS

## 2015-05-15 ENCOUNTER — Other Ambulatory Visit: Payer: Self-pay | Admitting: Gastroenterology

## 2015-05-22 ENCOUNTER — Other Ambulatory Visit (HOSPITAL_BASED_OUTPATIENT_CLINIC_OR_DEPARTMENT_OTHER): Payer: Medicare Other

## 2015-05-22 DIAGNOSIS — D472 Monoclonal gammopathy: Secondary | ICD-10-CM | POA: Diagnosis not present

## 2015-05-22 DIAGNOSIS — Z8546 Personal history of malignant neoplasm of prostate: Secondary | ICD-10-CM

## 2015-05-22 LAB — CBC WITH DIFFERENTIAL/PLATELET
BASO%: 0.7 % (ref 0.0–2.0)
BASOS ABS: 0 10*3/uL (ref 0.0–0.1)
EOS ABS: 0.1 10*3/uL (ref 0.0–0.5)
EOS%: 1.9 % (ref 0.0–7.0)
HCT: 40 % (ref 38.4–49.9)
HEMOGLOBIN: 13 g/dL (ref 13.0–17.1)
LYMPH%: 31.1 % (ref 14.0–49.0)
MCH: 30.5 pg (ref 27.2–33.4)
MCHC: 32.6 g/dL (ref 32.0–36.0)
MCV: 93.5 fL (ref 79.3–98.0)
MONO#: 0.3 10*3/uL (ref 0.1–0.9)
MONO%: 7.1 % (ref 0.0–14.0)
NEUT#: 2.3 10*3/uL (ref 1.5–6.5)
NEUT%: 59.2 % (ref 39.0–75.0)
Platelets: 165 10*3/uL (ref 140–400)
RBC: 4.28 10*6/uL (ref 4.20–5.82)
RDW: 13.9 % (ref 11.0–14.6)
WBC: 3.8 10*3/uL — ABNORMAL LOW (ref 4.0–10.3)
lymph#: 1.2 10*3/uL (ref 0.9–3.3)

## 2015-05-22 LAB — COMPREHENSIVE METABOLIC PANEL
ALBUMIN: 3.6 g/dL (ref 3.5–5.0)
ALK PHOS: 83 U/L (ref 40–150)
ALT: 35 U/L (ref 0–55)
ANION GAP: 8 meq/L (ref 3–11)
AST: 28 U/L (ref 5–34)
BUN: 23.7 mg/dL (ref 7.0–26.0)
CO2: 26 mEq/L (ref 22–29)
Calcium: 9.2 mg/dL (ref 8.4–10.4)
Chloride: 106 mEq/L (ref 98–109)
Creatinine: 1.3 mg/dL (ref 0.7–1.3)
EGFR: 62 mL/min/{1.73_m2} — AB (ref >90)
Glucose: 128 mg/dl (ref 70–140)
Potassium: 4 mEq/L (ref 3.5–5.1)
Sodium: 140 mEq/L (ref 136–145)
TOTAL PROTEIN: 7.3 g/dL (ref 6.4–8.3)

## 2015-05-22 LAB — LACTATE DEHYDROGENASE: LDH: 187 U/L (ref 125–245)

## 2015-05-23 LAB — BETA 2 MICROGLOBULIN, SERUM: Beta-2: 1.9 mg/L (ref 0.6–2.4)

## 2015-05-23 LAB — KAPPA/LAMBDA LIGHT CHAINS
IG LAMBDA FREE LIGHT CHAIN: 26.53 mg/L — AB (ref 5.71–26.30)
Ig Kappa Free Light Chain: 44.47 mg/L — ABNORMAL HIGH (ref 3.30–19.40)
KAPPA/LAMBDA FLC RATIO: 1.68 — AB (ref 0.26–1.65)

## 2015-05-23 LAB — PSA: PROSTATE SPECIFIC AG, SERUM: 0.4 ng/mL (ref 0.0–4.0)

## 2015-05-23 LAB — IGG, IGA, IGM
IGA/IMMUNOGLOBULIN A, SERUM: 260 mg/dL (ref 61–437)
IGG (IMMUNOGLOBIN G), SERUM: 858 mg/dL (ref 700–1600)
IGM (IMMUNOGLOBIN M), SRM: 68 mg/dL (ref 15–143)

## 2015-05-29 ENCOUNTER — Ambulatory Visit: Payer: Medicare Other | Admitting: Internal Medicine

## 2015-05-30 ENCOUNTER — Telehealth: Payer: Self-pay | Admitting: Internal Medicine

## 2015-05-30 NOTE — Telephone Encounter (Signed)
Patient came in today for f/u - f/u was yesterday. Appointment r/s to 4/4 @ 3:15 pm - patient aware of new date/time and given printout at registration area.

## 2015-06-12 ENCOUNTER — Ambulatory Visit (HOSPITAL_BASED_OUTPATIENT_CLINIC_OR_DEPARTMENT_OTHER): Payer: Medicare Other | Admitting: Internal Medicine

## 2015-06-12 ENCOUNTER — Encounter: Payer: Self-pay | Admitting: Internal Medicine

## 2015-06-12 VITALS — BP 158/67 | HR 69 | Temp 98.4°F | Resp 18 | Ht 75.0 in | Wt 229.5 lb

## 2015-06-12 DIAGNOSIS — D472 Monoclonal gammopathy: Secondary | ICD-10-CM

## 2015-06-12 DIAGNOSIS — Z8546 Personal history of malignant neoplasm of prostate: Secondary | ICD-10-CM

## 2015-06-12 NOTE — Progress Notes (Signed)
[facility] Telephone:([phone]   Fax:(336) [phone] OFFICE PROGRESS NOTE  [name], MD [name]  DIAGNOSIS:  1) Monoclonal gammopathy of undetermined significance (MGUS). 2) History of prostate cancer T1C followed by Dr. [name],  status post curative radiotherapy under the care of Dr. [name].  PRIOR THERAPY: Curative radiotherapy to the prostate under the care of Dr. [name].  CURRENT THERAPY: Observation.  INTERVAL HISTORY: Ricky Rowland. 70 y.o. male returns to the clinic today for annual followup visit. The patient is feeling fine today with no specific complaints except for some weakness of the rectal sphincter after completing his radiotherapy. He denied having any significant weakness or fatigue. The patient denied having any significant chest pain, shortness of breath, cough or hemoptysis. He has no weight loss or night sweats. He has repeat myeloma panel and he is here today for evaluation and discussion of his lab results.  MEDICAL HISTORY: Past Medical History  Diagnosis Date  . Elevated PSA   . Hypertension   . External carotid artery stenosis     MILD-- BILATERAL  . Borderline diabetes     DIET CONTROLLED  . Gout, arthritis   . History of gastric ulcer     REMOTE HX  . Heart murmur     MILD --  ASYMPTOMATIC  . Hearing loss in left ear   . Wears glasses   . Prostate cancer ([facility]) 12/21/12    Gleason 3+4=7    ALLERGIES:  has No Known Allergies.  MEDICATIONS:  Current Outpatient Prescriptions  Medication Sig Dispense Refill  . allopurinol (ZYLOPRIM) 300 MG tablet Take 300 mg by mouth every evening.    Marland Kitchen ALPRAZolam (XANAX) 0.25 MG tablet Take 0.25 mg by mouth daily as needed.  0  . amLODipine-valsartan (EXFORGE) 5-320 MG per tablet Take 1 tablet by mouth every evening.    . chlorthalidone (HYGROTON) 25 MG tablet     . clobetasol cream (TEMOVATE) [id number] % Apply 1 application topically as needed  (apply to rash on back).     . colchicine 0.6 MG tablet Take 0.6 mg by mouth daily as needed (for gout).     . metoprolol (LOPRESSOR) 50 MG tablet Take 50 mg by mouth every evening.     Marland Kitchen omeprazole (PRILOSEC) 20 MG capsule Take 20 mg by mouth daily.  0  . tamsulosin (FLOMAX) 0.4 MG CAPS capsule     . ZETIA 10 MG tablet Take 10 mg by mouth daily.  0   No current facility-administered medications for this visit.    SURGICAL HISTORY:  Past Surgical History  Procedure Laterality Date  . Knee arthroscopy w/ meniscal repair Left JAN 2014  . Lumbar spine surgery  1991  . Prostate biopsy N/A 12/21/2012    Procedure: BIOPSY TRANSRECTAL ULTRASONIC PROSTATE (TUBP);  Surgeon: [name], MD;  Location: [facility];  Service: Urology;  Laterality: N/A;  . Prostate biopsy  11/26/11    benign    REVIEW OF SYSTEMS:  A comprehensive review of systems was negative.   PHYSICAL EXAMINATION: General appearance: alert, cooperative and no distress Head: Normocephalic, without obvious abnormality, atraumatic Neck: no adenopathy, no JVD, supple, symmetrical, trachea midline and thyroid not enlarged, symmetric, no tenderness/mass/nodules Lymph nodes: Cervical, supraclavicular, and axillary nodes normal. Resp: clear to auscultation bilaterally Back: symmetric, no curvature. ROM normal. No CVA tenderness. Cardio: regular rate and rhythm, S1, S2 normal, no murmur, click, rub or  gallop GI: soft, non-tender; bowel sounds normal; no masses,  no organomegaly Extremities: extremities normal, atraumatic, no cyanosis or edema  ECOG PERFORMANCE STATUS: 1 - Symptomatic but completely ambulatory  Blood pressure 158/67, pulse 69, temperature 98.4 F (36.9 C), temperature source Oral, resp. rate 18, height 6\' 3"  (1.905 m), weight 229 lb 8 oz (104.101 kg), SpO2 99 %.  LABORATORY DATA: Lab Results  Component Value Date   WBC 3.8* 05/22/2015   HGB 13.0 05/22/2015   HCT 40.0 05/22/2015   MCV 93.5  05/22/2015   PLT 165 05/22/2015      Chemistry      Component Value Date/Time   NA 140 05/22/2015 1022   NA 137 12/23/2012 1322   K 4.0 05/22/2015 1022   K 3.9 12/23/2012 1322   CL 102 12/23/2012 1322   CO2 26 05/22/2015 1022   CO2 23 12/23/2012 1322   BUN 23.7 05/22/2015 1022   BUN 21 12/23/2012 1322   CREATININE 1.3 05/22/2015 1022   CREATININE 1.09 12/23/2012 1322      Component Value Date/Time   CALCIUM 9.2 05/22/2015 1022   CALCIUM 8.6 12/23/2012 1322   ALKPHOS 83 05/22/2015 1022   AST 28 05/22/2015 1022   ALT 35 05/22/2015 1022   BILITOT <0.30 05/22/2015 1022     [facility] lab results LTS FINAL DIAGNOSIS ASSESSMENT AND PLAN: This is a very pleasant 70 years old African American male with:  1) monoclonal gammopathy of undetermined significance (MGUS). His myeloma panel today showed no significant evidence for disease progression.  I discussed the lab result with the patient. I recommended for him to continue on observation with repeat myeloma panel in one year.  2) T1C prostate adenocarcinoma: He completed a course of curative radiotherapy under the care of Dr. [name] and his PSA is 0.4 on the recent blood work. He will continue on observation. He was advised to call immediately if he has any concerning symptoms in the interval.  The patient voices understanding of current disease status and treatment options and is in agreement with the current care plan.  All questions were answered. The patient knows to call the clinic with any problems, questions or concerns. We can certainly see the patient much sooner if necessary.   Disclaimer: This note was dictated with voice recognition software. Similar sounding words can inadvertently be transcribed and may not be corrected upon review.

## 2015-06-13 ENCOUNTER — Telehealth: Payer: Self-pay | Admitting: Internal Medicine

## 2015-06-13 NOTE — Telephone Encounter (Signed)
s.w. pt and advised on April 2018 appt....pt ok and aware

## 2015-09-07 ENCOUNTER — Emergency Department (HOSPITAL_COMMUNITY)
Admission: EM | Admit: 2015-09-07 | Discharge: 2015-09-08 | Disposition: A | Discharge status: Home / Self Care | Payer: Medicare Other | Attending: Emergency Medicine | Admitting: Emergency Medicine

## 2015-09-07 ENCOUNTER — Encounter (HOSPITAL_COMMUNITY): Payer: Self-pay | Admitting: *Deleted

## 2015-09-07 ENCOUNTER — Emergency Department (HOSPITAL_COMMUNITY): Payer: Medicare Other

## 2015-09-07 DIAGNOSIS — Z87891 Personal history of nicotine dependence: Secondary | ICD-10-CM | POA: Diagnosis not present

## 2015-09-07 DIAGNOSIS — I10 Essential (primary) hypertension: Secondary | ICD-10-CM | POA: Diagnosis not present

## 2015-09-07 DIAGNOSIS — Z79899 Other long term (current) drug therapy: Secondary | ICD-10-CM | POA: Diagnosis not present

## 2015-09-07 DIAGNOSIS — Z8546 Personal history of malignant neoplasm of prostate: Secondary | ICD-10-CM | POA: Diagnosis not present

## 2015-09-07 DIAGNOSIS — R42 Dizziness and giddiness: Secondary | ICD-10-CM

## 2015-09-07 DIAGNOSIS — H5500 Unspecified nystagmus: Secondary | ICD-10-CM | POA: Insufficient documentation

## 2015-09-07 LAB — BASIC METABOLIC PANEL
Anion gap: 12 (ref 5–15)
BUN: 24 mg/dL — AB (ref 6–20)
CHLORIDE: 102 mmol/L (ref 101–111)
CO2: 22 mmol/L (ref 22–32)
CREATININE: 1.47 mg/dL — AB (ref 0.61–1.24)
Calcium: 9.9 mg/dL (ref 8.9–10.3)
GFR calc Af Amer: 53 mL/min — ABNORMAL LOW (ref >60)
GFR calc non Af Amer: 46 mL/min — ABNORMAL LOW (ref >60)
GLUCOSE: 147 mg/dL — AB (ref 65–99)
POTASSIUM: 4.2 mmol/L (ref 3.5–5.1)
Sodium: 136 mmol/L (ref 135–145)

## 2015-09-07 LAB — I-STAT TROPONIN, ED: Troponin i, poc: 0 ng/mL (ref 0.00–0.08)

## 2015-09-07 LAB — CBC
HEMATOCRIT: 45.3 % (ref 39.0–52.0)
Hemoglobin: 15.1 g/dL (ref 13.0–17.0)
MCH: 31.5 pg (ref 26.0–34.0)
MCHC: 33.3 g/dL (ref 30.0–36.0)
MCV: 94.6 fL (ref 78.0–100.0)
Platelets: 202 10*3/uL (ref 150–400)
RBC: 4.79 MIL/uL (ref 4.22–5.81)
RDW: 13.5 % (ref 11.5–15.5)
WBC: 8.5 10*3/uL (ref 4.0–10.5)

## 2015-09-07 LAB — CBG MONITORING, ED: Glucose-Capillary: 121 mg/dL — ABNORMAL HIGH (ref 65–99)

## 2015-09-07 MED ORDER — SODIUM CHLORIDE 0.9 % IV BOLUS (SEPSIS)
500.0000 mL | Freq: Once | INTRAVENOUS | Status: AC
  Administered 2015-09-07: 500 mL via INTRAVENOUS

## 2015-09-07 MED ORDER — MECLIZINE HCL 25 MG PO TABS
25.0000 mg | ORAL_TABLET | Freq: Once | ORAL | Status: AC
  Administered 2015-09-07: 25 mg via ORAL
  Filled 2015-09-07: qty 1

## 2015-09-07 MED ORDER — DIAZEPAM 5 MG/ML IJ SOLN
5.0000 mg | Freq: Once | INTRAMUSCULAR | Status: AC
  Administered 2015-09-07: 5 mg via INTRAVENOUS
  Filled 2015-09-07: qty 2

## 2015-09-07 NOTE — ED Notes (Signed)
Pt's son at nurse's station.  Sts he was treated for H. Pylori with the same symptoms, and his father was treated, but only given half the treatment.  Does not want his father sent home in this condition.  This RN told son that she would speak to EDP and try to figure out game plan and provide update when possible

## 2015-09-07 NOTE — ED Notes (Signed)
Pt able to sit on bedside, sts "lightheadedness" is better.  Still c/o some dizziness with standing, but "better than it was"

## 2015-09-07 NOTE — ED Notes (Signed)
Pt in c/o nausea and dizziness that started while golfing earlier, states he will develop diaphoresis, this has happened several times recently, seen at PCP with no answers to symptoms, denies pain, pt skin warm and dry at this time, c/o continued nausea

## 2015-09-07 NOTE — ED Notes (Signed)
MD at bedside updating pt and family.

## 2015-09-08 MED ORDER — MECLIZINE HCL 25 MG PO TABS: 25.0000 mg | ORAL_TABLET | Freq: Three times a day (TID) | ORAL | Status: DC | PRN

## 2015-09-08 NOTE — Discharge Instructions (Signed)
You were seen and evaluated today for your dizziness. The exact cause is not completely clear at this time although I feel this is likely vertigo that you are experiencing. These make follow-up appointments with neurology and ENT outpatient. Use the Antivert as prescribed. Try to keep yourself well-hydrated.  Dizziness Dizziness is a common problem. It is a feeling of unsteadiness or light-headedness. You may feel like you are about to faint. Dizziness can lead to injury if you stumble or fall. Anyone can become dizzy, but dizziness is more common in older adults. This condition can be caused by a number of things, including medicines, dehydration, or illness. HOME CARE INSTRUCTIONS Taking these steps may help with your condition: Eating and Drinking  Drink enough fluid to keep your urine clear or pale yellow. This helps to keep you from becoming dehydrated. Try to drink more clear fluids, such as water.  Do not drink alcohol.  Limit your caffeine intake if directed by your health care provider.  Limit your salt intake if directed by your health care provider. Activity  Avoid making quick movements.  Rise slowly from chairs and steady yourself until you feel okay.  In the morning, first sit up on the side of the bed. When you feel okay, stand slowly while you hold onto something until you know that your balance is fine.  Move your legs often if you need to stand in one place for a long time. Tighten and relax your muscles in your legs while you are standing.  Do not drive or operate heavy machinery if you feel dizzy.  Avoid bending down if you feel dizzy. Place items in your home so that they are easy for you to reach without leaning over. Lifestyle  Do not use any tobacco products, including cigarettes, chewing tobacco, or electronic cigarettes. If you need help quitting, ask your health care provider.  Try to reduce your stress level, such as with yoga or meditation. Talk with your  health care provider if you need help. General Instructions  Watch your dizziness for any changes.  Take medicines only as directed by your health care provider. Talk with your health care provider if you think that your dizziness is caused by a medicine that you are taking.  Tell a friend or a family member that you are feeling dizzy. If he or she notices any changes in your behavior, have this person call your health care provider.  Keep all follow-up visits as directed by your health care provider. This is important. SEEK MEDICAL CARE IF:  Your dizziness does not go away.  Your dizziness or light-headedness gets worse.  You feel nauseous.  You have reduced hearing.  You have new symptoms.  You are unsteady on your feet or you feel like the room is spinning. SEEK IMMEDIATE MEDICAL CARE IF:  You vomit or have diarrhea and are unable to eat or drink anything.  You have problems talking, walking, swallowing, or using your arms, hands, or legs.  You feel generally weak.  You are not thinking clearly or you have trouble forming sentences. It may take a friend or family member to notice this.  You have chest pain, abdominal pain, shortness of breath, or sweating.  Your vision changes.  You notice any bleeding.  You have a headache.  You have neck pain or a stiff neck.  You have a fever.   This information is not intended to replace advice given to you by your health care provider.  Make sure you discuss any questions you have with your health care provider.   Document Released: 08/20/2000 Document Revised: 07/11/2014 Document Reviewed: 02/20/2014 Elsevier Interactive Patient Education [employer].

## 2015-09-08 NOTE — ED Notes (Signed)
Patient able to ambulate independently  

## 2015-09-08 NOTE — ED Provider Notes (Signed)
CSN: [id number]     Arrival date & time 09/07/15  1753 History   First MD Initiated Contact with Patient 09/07/15 2010     Chief Complaint  Patient presents with  . Nausea  . Dizziness     (Consider location/radiation/quality/duration/timing/severity/associated sxs/prior Treatment) HPI Comments: 70 year old male with history of hypertension, diet controlled diabetes presents for dizziness and lightheadedness. The patient and his family report that the symptoms have been ongoing off and on over the last 3 or so years. They do note that this seemed to start around the same time as the patient developed sudden hearing loss in his left ear. Patient reports that with these episodes they start by him feeling extremely hot. He said this seemed to travel up his body and then he gets lightheaded and dizzy and feels off balance. He says he did go 6 months without an episode but recently they have been recurrent. He denies headache. He says with these episodes he also gets nauseous and often vomits. Today the patient was playing golf when this episode started.  Patient is a 70 y.o. male presenting with dizziness.  Dizziness Associated symptoms: nausea and vomiting   Associated symptoms: no chest pain, no diarrhea, no headaches, no palpitations, no shortness of breath and no weakness     Past Medical History  Diagnosis Date  . Elevated PSA   . Hypertension   . External carotid artery stenosis     MILD-- BILATERAL  . Borderline diabetes     DIET CONTROLLED  . Gout, arthritis   . History of gastric ulcer     REMOTE HX  . Heart murmur     MILD --  ASYMPTOMATIC  . Hearing loss in left ear   . Wears glasses   . Prostate cancer ([place]) 12/21/12    Gleason 3+4=7   Past Surgical History  Procedure Laterality Date  . Knee arthroscopy w/ meniscal repair Left JAN 2014  . Lumbar spine surgery  1991  . Prostate biopsy N/A 12/21/2012    Procedure: BIOPSY TRANSRECTAL ULTRASONIC PROSTATE (TUBP);   Surgeon: [name], MD;  Location: [facility];  Service: Urology;  Laterality: N/A;  . Prostate biopsy  11/26/11    benign   History reviewed. No pertinent family history. Social History  Substance Use Topics  . Smoking status: Former Smoker -- 1.00 packs/day for 30 years    Types: Cigarettes    Quit date: 12/15/1992  . Smokeless tobacco: Never Used  . Alcohol Use: Yes     Comment: OCCASIONAL    Review of Systems  Constitutional: Negative for fever, chills, diaphoresis, appetite change and fatigue.  HENT: Negative for congestion, postnasal drip and rhinorrhea.   Eyes: Negative for visual disturbance.  Respiratory: Negative for cough, chest tightness and shortness of breath.   Cardiovascular: Negative for chest pain, palpitations and leg swelling.  Gastrointestinal: Positive for nausea and vomiting. Negative for abdominal pain and diarrhea.  Genitourinary: Negative for dysuria, urgency and hematuria.  Musculoskeletal: Negative for myalgias and back pain.  Skin: Negative for rash and wound.  Neurological: Positive for dizziness and light-headedness. Negative for seizures, syncope, facial asymmetry, speech difficulty, weakness, numbness and headaches.  Hematological: Does not bruise/bleed easily.      Allergies  Atorvastatin and Statins  Home Medications   Prior to Admission medications   Medication Sig Start Date End Date Taking? Authorizing Provider  acetaminophen (TYLENOL) 500 MG tablet Take 500 mg by mouth every 6 (six) hours as needed (  pain).   Yes Historical Provider, MD  allopurinol (ZYLOPRIM) 300 MG tablet Take 300 mg by mouth at bedtime.    Yes Historical Provider, MD  amLODipine-valsartan (EXFORGE) 5-320 MG per tablet Take 1 tablet by mouth at bedtime.    Yes Historical Provider, MD  colchicine 0.6 MG tablet Take 0.6 mg by mouth 2 (two) times daily as needed (for gout).    Yes Historical Provider, MD  metoprolol succinate (TOPROL-XL) 50 MG 24 hr  tablet Take 50 mg by mouth daily. 07/17/15  Yes Historical Provider, MD  Naphazoline HCl (CLEAR EYES OP) Place 1 drop into both eyes See admin instructions. Instill 1 drop into both eyes every morning, may also use 1 drop both eyes later in the day as needed for dry eyes/irritation   Yes Historical Provider, MD  omeprazole (PRILOSEC) 20 MG capsule Take 20 mg by mouth 2 (two) times daily.  05/15/14  Yes Historical Provider, MD  tamsulosin (FLOMAX) 0.4 MG CAPS capsule Take 0.4 mg by mouth at bedtime.  07/29/13  Yes Historical Provider, MD  tolnaftate (TINACTIN) 1 % cream Apply 1 application topically daily as needed (jock itch).   Yes Historical Provider, MD  meclizine (ANTIVERT) 25 MG tablet Take 1 tablet (25 mg total) by mouth 3 (three) times daily as needed for dizziness. 09/08/15   Harvel Quale, MD   BP 134/77 mmHg  Pulse 58  Temp(Src) 97.6 F (36.4 C) (Oral)  Resp 20  SpO2 94% Physical Exam  Constitutional: He is oriented to person, place, and time. He appears well-developed and well-nourished. No distress.  HENT:  Head: Normocephalic and atraumatic.  Right Ear: External ear normal.  Left Ear: External ear normal.  Mouth/Throat: Oropharynx is clear and moist. No oropharyngeal exudate.  Eyes: Pupils are equal, round, and reactive to light. Right eye exhibits nystagmus (horizontal more rightward). Right eye exhibits normal extraocular motion. Left eye exhibits nystagmus (horizontal more rightward). Left eye exhibits normal extraocular motion. Pupils are equal.  Neck: Normal range of motion. Neck supple.  Cardiovascular: Normal rate, regular rhythm, normal heart sounds and intact distal pulses.   No murmur heard. Pulmonary/Chest: Effort normal. No respiratory distress. He has no wheezes. He has no rales.  Abdominal: Soft. He exhibits no distension. There is no tenderness.  Musculoskeletal: He exhibits no edema.  Neurological: He is alert and oriented to person, place, and time. He has normal  strength. No cranial nerve deficit or sensory deficit. Coordination normal.  Normal finger to nose on examination  Skin: Skin is warm and dry. No rash noted. He is not diaphoretic.  Vitals reviewed.   ED Course  Procedures (including critical care time) Labs Review Labs Reviewed  BASIC METABOLIC PANEL - Abnormal; Notable for the following:    Glucose, Bld 147 (*)    BUN 24 (*)    Creatinine, Ser 1.47 (*)    GFR calc non Af Amer 46 (*)    GFR calc Af Amer 53 (*)    All other components within normal limits  CBG MONITORING, ED - Abnormal; Notable for the following:    Glucose-Capillary 121 (*)    All other components within normal limits  CBC  I-STAT TROPOININ, ED    Imaging Review Dg Chest 2 View  09/07/2015  CLINICAL DATA:  Chest pain EXAM: CHEST  2 VIEW COMPARISON:  07/09/2007 FINDINGS: Heart size within normal limits. Normal aortic contour. Negative for heart failure. Lungs are clear without infiltrate or effusion. Negative for mass or  adenopathy. No change from the prior study. IMPRESSION: No active cardiopulmonary disease. Electronically Signed   By: Franchot Gallo M.D.   On: 09/07/2015 18:47   I have personally reviewed and evaluated these images and lab results as part of my medical decision-making.   EKG Interpretation   Date/Time:  Friday September 07 2015 18:01:35 EDT Ventricular Rate:  60 PR Interval:  164 QRS Duration: 82 QT Interval:  446 QTC Calculation: 446 R Axis:   -26 Text Interpretation:  Normal sinus rhythm Minimal voltage criteria for  LVH, may be normal variant Possible Anterior infarct , age undetermined  Abnormal ECG No significant change since last tracing Confirmed by Lonia Skinner ([id number]) on 09/07/2015 8:10:44 PM      MDM  Patient was seen and evaluated in stable condition. Symptoms positional and worse when I made him sit up. Benign neurologic examination. Patient noted to have some nystagmus. Patient's symptoms improved with IV fluids, Antivert,  Valium. Patient has had extensive workup outpatient for this including MRI of the brain previously. He reports these are the same episodes that just keep recurring and he just says that they're so uncomfortable that he wishes we could find a cause for it so it could be fixed. At this time with the onset around the time of his hearing loss I feel that likely he is experiencing vertigo from some sort of into her ear pathology. I discussed this at length with the patient and his family at bedside. They expressed understanding and agreement with plan for discharge with referral to ENT and neurology outpatient and with prescription for Antivert. Patient was discharged home in stable condition. Final diagnoses:  Dizziness    1. Dizziness, likely vertigo    Harvel Quale, MD 09/08/15 [phone]

## 2015-09-13 ENCOUNTER — Ambulatory Visit (INDEPENDENT_AMBULATORY_CARE_PROVIDER_SITE_OTHER): Payer: Medicare Other | Admitting: Diagnostic Neuroimaging

## 2015-09-13 ENCOUNTER — Encounter: Payer: Self-pay | Admitting: Diagnostic Neuroimaging

## 2015-09-13 VITALS — Ht 75.0 in | Wt 221.0 lb

## 2015-09-13 DIAGNOSIS — R112 Nausea with vomiting, unspecified: Secondary | ICD-10-CM

## 2015-09-13 DIAGNOSIS — R42 Dizziness and giddiness: Secondary | ICD-10-CM | POA: Diagnosis not present

## 2015-09-13 DIAGNOSIS — R29818 Other symptoms and signs involving the nervous system: Secondary | ICD-10-CM | POA: Diagnosis not present

## 2015-09-13 DIAGNOSIS — R2689 Other abnormalities of gait and mobility: Secondary | ICD-10-CM

## 2015-09-13 NOTE — Patient Instructions (Signed)
Thank you for coming to see [place] at [facility]. I hope we have been able to provide you high quality care today.  You may receive a patient satisfaction survey over the next few weeks. We would appreciate your feedback and comments so that we may continue to improve ourselves and the health of our patients.  - I will check EEG - I will refer you to cardiology consult - Please monitor symptoms; check BP at home with any future events   ~~~~~~~~~~~~~~~~~~~~~~~~~~~~~~~~~~~~~~~~~~~~~~~~~~~~~~~~~~~~~~~~~  DR. Aithan Farrelly'S GUIDE TO HAPPY AND HEALTHY LIVING These are some of my general health and wellness recommendations. Some of them may apply to you better than others. Please use common sense as you try these suggestions and feel free to ask me any questions.   ACTIVITY/FITNESS Mental, social, emotional and physical stimulation are very important for brain and body health. Try learning a new activity (arts, music, language, sports, games).  Keep moving your body to the best of your abilities. You can do this at home, inside or outside, the park, community center, gym or anywhere you like. Consider a physical therapist or personal trainer to get started. Consider the app Sworkit. Fitness trackers such as smart-watches, smart-phones or Fitbits can help as well.   NUTRITION Eat more plants: colorful vegetables, nuts, seeds and berries.  Eat less sugar, salt, preservatives and processed foods.  Avoid toxins such as cigarettes and alcohol.  Drink water when you are thirsty. Warm water with a slice of lemon is an excellent morning drink to start the day.  Consider these websites for more information The Nutrition Source (https://www.henry-hernandez.biz/) Precision Nutrition (WindowBlog.ch)   RELAXATION Consider practicing mindfulness meditation or other relaxation techniques such as deep breathing, prayer, yoga, tai chi, massage.  See website mindful.org or the apps Headspace or Calm to help get started.   SLEEP Try to get at least 7-8+ hours sleep per day. Regular exercise and reduced caffeine will help you sleep better. Practice good sleep hygeine techniques. See website sleep.org for more information.   PLANNING Prepare estate planning, living will, healthcare POA documents. Sometimes this is best planned with the help of an attorney. Theconversationproject.org and agingwithdignity.org are excellent resources.

## 2015-09-13 NOTE — Progress Notes (Signed)
[facility]  PATIENT: Ricky WEINERT Sr. DOB: 07/02/1945  REFERRING CLINICIAN: Alfonse Spruce HISTORY FROM: patient and wife  REASON FOR VISIT: new consult    HISTORICAL  CHIEF COMPLAINT:  Chief Complaint  Patient presents with  . Dizziness    rm 6, New Pt, wife- Ricky Rowland, "dizziness off and on x 4 years, happens randomly; may happen when I lean down and stand back up"    HISTORY OF PRESENT ILLNESS:   70 year old male with hypertension, hypercholesteremia, prostate cancer, here for valuation of dizziness attacks. Symptoms started in 2014. Patient has 8 to 12 episodes per year. No specific triggering factors. Patient had one attack last week and another one the week before. Previously last attack was in November 2016. Patient describes a sensation of intestine, stomach upset sensation, overheating sensation, severe sweats, lightheadedness and nausea. Sometimes patient vomits. Patient feels off balance during these attacks. No unilateral numbness, tingling, slurred speech, double vision or headaches with the attacks. Patient typically tries to make it home, lay down and go to sleep. If patient vomits or has a bowel movement after the tachycardia started, seems to relieve the sensation and symptom. If patient is laying flat after an attack, if he tries to sit up symptoms are exacerbated. Patient has never measured his blood pressure during these attacks. Patient has got to the emergency room for several of these attacks while in the emergency room hypotension has not been documented.  Patient denies any palpitations, chest pain, shortness of breath. Episodes of occurred in standing or sitting position. Episodes of occurred at home as well as on the golf [place]. No specific triggering factors.  In the past patient has had MRI of the brain, cardiac testing, heart monitor, nucleas medicine stress test, which appeared unremarkable. These testing were done 2-3 years  ago.   REVIEW OF SYSTEMS: Full 14 system review of systems performed and negative with exception of: Heart murmur swelling in legs hearing loss joint pain aching muscles.  ALLERGIES: Allergies  Allergen Reactions  . Atorvastatin Other (See Comments)    Joint pain   . Statins Other (See Comments)    Joint pain    HOME MEDICATIONS: Outpatient Prescriptions Prior to Visit  Medication Sig Dispense Refill  . acetaminophen (TYLENOL) 500 MG tablet Take 500 mg by mouth every 6 (six) hours as needed (pain).    Marland Kitchen allopurinol (ZYLOPRIM) 300 MG tablet Take 300 mg by mouth at bedtime.     Marland Kitchen amLODipine-valsartan (EXFORGE) 5-320 MG per tablet Take 1 tablet by mouth at bedtime.     . colchicine 0.6 MG tablet Take 0.6 mg by mouth 2 (two) times daily as needed (for gout).     . meclizine (ANTIVERT) 25 MG tablet Take 1 tablet (25 mg total) by mouth 3 (three) times daily as needed for dizziness. 30 tablet 0  . metoprolol succinate (TOPROL-XL) 50 MG 24 hr tablet Take 50 mg by mouth daily.    . Naphazoline HCl (CLEAR EYES OP) Place 1 drop into both eyes See admin instructions. Instill 1 drop into both eyes every morning, may also use 1 drop both eyes later in the day as needed for dry eyes/irritation    . omeprazole (PRILOSEC) 20 MG capsule Take 20 mg by mouth 2 (two) times daily.   0  . tamsulosin (FLOMAX) 0.4 MG CAPS capsule Take 0.4 mg by mouth at bedtime.     Marland Kitchen tolnaftate (TINACTIN) 1 % cream Apply 1 application topically daily as needed (  jock itch).     No facility-administered medications prior to visit.    PAST MEDICAL HISTORY: Past Medical History  Diagnosis Date  . Elevated PSA   . Hypertension   . External carotid artery stenosis     MILD-- BILATERAL  . Borderline diabetes     DIET CONTROLLED  . Gout, arthritis   . History of gastric ulcer     REMOTE HX  . Heart murmur     MILD --  ASYMPTOMATIC  . Hearing loss in left ear   . Wears glasses   . Prostate cancer ([place]) 12/21/12     Gleason 3+4=7    PAST SURGICAL HISTORY: Past Surgical History  Procedure Laterality Date  . Knee arthroscopy w/ meniscal repair Left JAN 2014  . Lumbar spine surgery  1991  . Prostate biopsy N/A 12/21/2012    Procedure: BIOPSY TRANSRECTAL ULTRASONIC PROSTATE (TUBP);  Surgeon: [name], MD;  Location: [facility];  Service: Urology;  Laterality: N/A;  . Prostate biopsy  11/26/11    benign    FAMILY HISTORY: Family History  Problem Relation Age of Onset  . Cancer Sister     lung  . Cancer Brother     prostate    SOCIAL HISTORY:  Social History   Social History  . Marital Status: Married    Spouse Name: geraldine  . Number of Children: 3  . Years of Education: some colle   Occupational History  . retired    Social History Main Topics  . Smoking status: Former Smoker -- 1.00 packs/day for 30 years    Types: Cigarettes    Quit date: 12/15/1997  . Smokeless tobacco: Never Used  . Alcohol Use: Yes     Comment: OCCASIONAL  . Drug Use: No  . Sexual Activity: Not on file   Other Topics Concern  . Not on file   Social History Narrative   Lives with wife at home    caffeine use- occas, mostly decaf coffee     PHYSICAL EXAM   GENERAL EXAM/CONSTITUTIONAL: Vitals:  Filed Vitals:   09/13/15 1430  Height: 6\' 3"  (1.905 m)  Weight: 221 lb (100.245 kg)     Orthostatic VS for the past 24 hrs (Last 3 readings):  BP- Lying Pulse- Lying BP- Sitting Pulse- Sitting BP- Standing at 0 minutes Pulse- Standing at 0 minutes  09/13/15 1431 154/77 mmHg 56 166/83 mmHg 60 165/80 mmHg 57     Body mass index is 27.62 kg/(m^2).  Visual Acuity Screening   Right eye Left eye Both eyes  Without correction:     With correction: 20/40 20/30      Patient is in no distress; well developed, nourished and groomed; neck is supple  CARDIOVASCULAR:  Examination of carotid arteries is normal; no carotid bruits  Regular rate and rhythm, no  murmurs  Examination of peripheral vascular system by observation and palpation is normal  EYES:  Ophthalmoscopic exam of optic discs and posterior segments is normal; no papilledema or hemorrhages  MUSCULOSKELETAL:  Gait, strength, tone, movements noted in Neurologic exam below  NEUROLOGIC: MENTAL STATUS:  No flowsheet data found.  awake, alert, oriented to person, place and time  recent and remote memory intact  normal attention and concentration  language fluent, comprehension intact, naming intact,   fund of knowledge appropriate  CRANIAL NERVE:   2nd - no papilledema on fundoscopic exam  2nd, 3rd, 4th, 6th - pupils equal and reactive to light, visual fields  full to confrontation, extraocular muscles intact, no nystagmus  5th - facial sensation symmetric  7th - facial strength symmetric  8th - hearing intact  9th - palate elevates symmetrically, uvula midline  11th - shoulder shrug symmetric  12th - tongue protrusion midline  MOTOR:   normal bulk and tone, full strength in the BUE, BLE  SENSORY:   normal and symmetric to light touch, temperature, vibration  COORDINATION:   finger-nose-finger, fine finger movements normal  REFLEXES:   deep tendon reflexes present and symmetric  GAIT/STATION:   narrow based gait; romberg is negative    DIAGNOSTIC DATA (LABS, IMAGING, TESTING) - I reviewed patient records, labs, notes, testing and imaging myself where available.  Lab Results  Component Value Date   WBC 8.5 09/07/2015   HGB 15.1 09/07/2015   HCT 45.3 09/07/2015   MCV 94.6 09/07/2015   PLT 202 09/07/2015      Component Value Date/Time   NA 136 09/07/2015 1806   NA 140 05/22/2015 1022   K 4.2 09/07/2015 1806   K 4.0 05/22/2015 1022   CL 102 09/07/2015 1806   CO2 22 09/07/2015 1806   CO2 26 05/22/2015 1022   GLUCOSE 147* 09/07/2015 1806   GLUCOSE 128 05/22/2015 1022   BUN 24* 09/07/2015 1806   BUN 23.7 05/22/2015 1022   CREATININE  1.47* 09/07/2015 1806   CREATININE 1.3 05/22/2015 1022   CALCIUM 9.9 09/07/2015 1806   CALCIUM 9.2 05/22/2015 1022   PROT 7.3 05/22/2015 1022   ALBUMIN 3.6 05/22/2015 1022   AST 28 05/22/2015 1022   ALT 35 05/22/2015 1022   ALKPHOS 83 05/22/2015 1022   BILITOT <0.30 05/22/2015 1022   GFRNONAA 46* 09/07/2015 1806   GFRAA 53* 09/07/2015 1806   No results found for: CHOL, HDL, LDLCALC, LDLDIRECT, TRIG, CHOLHDL No results found for: HGBA1C No results found for: VITAMINB12 No results found for: TSH  01/10/13 nuclear stress test 1. No fixed or reversible defects to suggest inducible ischemia. 2. Normal left ventricular wall motion and thickening. 3. Normal ejection fraction.  02/03/15 MRI brain (with and without) [I reviewed images myself and agree with interpretation. -VRP]  1. No acute intracranial abnormality or mass. 2. Chronic right basal ganglia lacunar infarcts. 3. Mild cerebral and cerebellar atrophy.  09/07/15 EKG [I reviewed images myself and agree with interpretation. -VRP]  - normal sinus rhythm     ASSESSMENT AND PLAN  70 y.o. year old male here with intermittent unprovoked episodes of lightheadedness, presyncope, nausea, diaphoresis. Findings suspicious for generalized hypotension / hypoperfusion events.   Ddx: cardiogenic presyncope, cardiac arrhythmia, dehydration, atypical partial seizure  1. Nausea and vomiting, intractability of vomiting not specified, unspecified vomiting type   2. Episodic lightheadedness   3. Balance problem     PLAN: - I will check EEG - I will refer to cardiology consult - monitor symptoms; check BP at home with any future events  Orders Placed This Encounter  Procedures  . Ambulatory referral to Cardiology  . EEG adult   Return in about 2 months (around 11/14/2015).  I reviewed images, labs, notes, records myself. I summarized findings and reviewed with patient, for this high risk condition (presyncope; episodic  lightheadedness; dizziness; nausea) requiring high complexity decision making.     Penni Bombard, MD [phone], [id number] PM Certified in Neurology, Neurophysiology and Neuroimaging  [facility] [address] [place],  [address] [phone]

## 2015-10-16 ENCOUNTER — Ambulatory Visit: Payer: Medicare Other | Admitting: Cardiovascular Disease

## 2015-10-16 ENCOUNTER — Ambulatory Visit (INDEPENDENT_AMBULATORY_CARE_PROVIDER_SITE_OTHER): Payer: Medicare Other | Admitting: Diagnostic Neuroimaging

## 2015-10-16 DIAGNOSIS — R55 Syncope and collapse: Secondary | ICD-10-CM

## 2015-10-16 DIAGNOSIS — R42 Dizziness and giddiness: Secondary | ICD-10-CM

## 2015-10-16 DIAGNOSIS — R112 Nausea with vomiting, unspecified: Secondary | ICD-10-CM

## 2015-10-17 ENCOUNTER — Ambulatory Visit (INDEPENDENT_AMBULATORY_CARE_PROVIDER_SITE_OTHER): Payer: Medicare Other | Admitting: Cardiovascular Disease

## 2015-10-17 ENCOUNTER — Encounter: Payer: Self-pay | Admitting: Cardiovascular Disease

## 2015-10-17 VITALS — BP 159/80 | HR 65 | Ht 75.0 in | Wt 216.4 lb

## 2015-10-17 DIAGNOSIS — H832X1 Labyrinthine dysfunction, right ear: Secondary | ICD-10-CM

## 2015-10-17 DIAGNOSIS — Z7189 Other specified counseling: Secondary | ICD-10-CM

## 2015-10-17 DIAGNOSIS — Z7689 Persons encountering health services in other specified circumstances: Secondary | ICD-10-CM

## 2015-10-17 MED ORDER — ONDANSETRON HCL 4 MG PO TABS: 4.0000 mg | ORAL_TABLET | Freq: Three times a day (TID) | ORAL | 0 refills | Status: DC | PRN

## 2015-10-17 NOTE — Progress Notes (Signed)
Cardiology Office Note   Date:  10/17/2015   ID:  Ricky Calvi., DOB [birth year], MRN [id number]  PCP:  Kandice Hams, MD  Cardiologist:   Jenkins Rouge, MD   Chief Complaint  Patient presents with  . Establish Care    lightheadedness, sweats & nausea, occ @ different times, cant pin point a spec time, per pt      History of Present Illness:  70 y.o.  male with hypertension, hypercholesteremia, prostate cancer, here for valuation of dizziness attacks. Symptoms started in 2014. Patient has 8 to 12 episodes per year. No specific triggering factors. Patient had one attack last week and another one the week before. Previously last attack was in November 2016. Patient describes a sensation of intestine, stomach upset sensation, overheating sensation, severe sweats, lightheadedness and nausea. Sometimes patient vomits. Patient feels off balance during these attacks. No unilateral numbness, tingling, slurred speech, double vision or headaches with the attacks. Patient typically tries to make it home, lay down and go to sleep. If patient vomits or has a bowel movement after the tachycardia started, seems to relieve the sensation and symptom. If patient is laying flat after an attack, if he tries to sit up symptoms are exacerbated. Patient has never measured his blood pressure during these attacks. Patient has got to the emergency room for several of these attacks while in the emergency room hypotension has not been documented.  Patient denies any palpitations, chest pain, shortness of breath. Episodes of occurred in standing or sitting position. Episodes of occurred at home as well as [place]. No specific triggering factors.  In the past patient has had MRI of the brain, cardiac testing, heart monitor, nucleas medicine stress test, which appeared unremarkable. These testing were done 2014   Not postural in neuro office 09/13/15    Past Medical History:  Diagnosis Date  .  Borderline diabetes    DIET CONTROLLED  . Elevated PSA   . External carotid artery stenosis    MILD-- BILATERAL  . Gout, arthritis   . Hearing loss in left ear   . Heart murmur    MILD --  ASYMPTOMATIC  . History of gastric ulcer    REMOTE HX  . Hypertension   . Prostate cancer ([place]) 12/21/12   Gleason 3+4=7  . Wears glasses     Past Surgical History:  Procedure Laterality Date  . KNEE ARTHROSCOPY W/ MENISCAL REPAIR Left JAN 2014  . [place]  . PROSTATE BIOPSY N/A 12/21/2012   Procedure: BIOPSY TRANSRECTAL ULTRASONIC PROSTATE (TUBP);  Surgeon: [name], MD;  Location: [facility];  Service: Urology;  Laterality: N/A;  . PROSTATE BIOPSY  11/26/11   benign     Current Outpatient Prescriptions  Medication Sig Dispense Refill  . acetaminophen (TYLENOL) 500 MG tablet Take 500 mg by mouth every 6 (six) hours as needed (pain).    Marland Kitchen allopurinol (ZYLOPRIM) 300 MG tablet Take 300 mg by mouth at bedtime.     Marland Kitchen amLODipine-valsartan (EXFORGE) 5-320 MG per tablet Take 1 tablet by mouth at bedtime.     . colchicine 0.6 MG tablet Take 0.6 mg by mouth 2 (two) times daily as needed (for gout).     . meclizine (ANTIVERT) 25 MG tablet Take 1 tablet (25 mg total) by mouth 3 (three) times daily as needed for dizziness. 30 tablet 0  . metoprolol succinate (TOPROL-XL) 50 MG 24 hr tablet Take 50 mg  by mouth daily.    . Naphazoline HCl (CLEAR EYES OP) Place 1 drop into both eyes See admin instructions. Instill 1 drop into both eyes every morning, may also use 1 drop both eyes later in the day as needed for dry eyes/irritation    . omeprazole (PRILOSEC) 20 MG capsule Take 20 mg by mouth 2 (two) times daily.   0  . tamsulosin (FLOMAX) 0.4 MG CAPS capsule Take 0.4 mg by mouth at bedtime.     Marland Kitchen tolnaftate (TINACTIN) 1 % cream Apply 1 application topically daily as needed (jock itch).    . ondansetron (ZOFRAN) 4 MG tablet Take 1 tablet (4 mg total) by mouth every 8  (eight) hours as needed for nausea or vomiting. 30 tablet 0   No current facility-administered medications for this visit.     Allergies:   Atorvastatin and Statins    Social History:  The patient  reports that he quit smoking about 17 years ago. His smoking use included Cigarettes. He has a 30.00 pack-year smoking history. He has never used smokeless tobacco. He reports that he drinks alcohol. He reports that he does not use drugs.   Family History:  The patient's family history includes Cancer in his brother and sister.    ROS:  Please see the history of present illness.   Otherwise, review of systems are positive for none.   All other systems are reviewed and negative.    PHYSICAL EXAM: VS:  BP (!) 159/80 (BP Location: Left Arm, Patient Position: Sitting, Cuff Size: Large)   Pulse 65   Ht 6\' 3"  (1.905 m)   Wt 216 lb 6.4 oz (98.2 kg)   SpO2 96%   BMI 27.05 kg/m  , BMI Body mass index is 27.05 kg/m. Affect appropriate Healthy:  appears stated age 50: normal Neck supple with no adenopathy JVP normal no bruits no thyromegaly Lungs clear with no wheezing and good diaphragmatic motion Heart:  S1/S2 no murmur, no rub, gallop or click PMI normal Abdomen: benighn, BS positve, no tenderness, no AAA no bruit.  No HSM or HJR Distal pulses intact with no bruits No edema Neuro non-focal Skin warm and dry No muscular weakness    EKG:  09/09/15  SR voltage for LVH    Recent Labs: 05/22/2015: ALT 35 09/07/2015: BUN 24; Creatinine, Ser 1.47; Hemoglobin 15.1; Platelets 202; Potassium 4.2; Sodium 136    Lipid Panel No results found for: CHOL, TRIG, HDL, CHOLHDL, VLDL, LDLCALC, LDLDIRECT    Wt Readings from Last 3 Encounters:  10/17/15 216 lb 6.4 oz (98.2 kg)  09/13/15 221 lb (100.2 kg)  06/12/15 229 lb 8 oz (104.1 kg)      Other studies Reviewed: Additional studies/ records that were reviewed today include: ER  Visits, ECG;s notes Dr Delfina Redwood and Dr Launa Grill  .    ASSESSMENT AND PLAN:  1.  Dizziness  Has had previous w/u negative. Symptoms very atypical for any cardiac issue or arrhythmia. Has not been documented to be postural in office or ER.  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 Have referred to vestibular PT will see back next available To see how there evaluation went. Gave him hand out on Epley Maneuvers to try   No need for cardiac testing at this time Script for zofran given and he already has meclizine    Current medicines are reviewed at length with the patient today.  The patient does not have concerns regarding  medicines.  The following changes have been made:  no change  Labs/ tests ordered today include: none   Orders Placed This Encounter  Procedures  . Ambulatory Referral to Neuro Rehab     Disposition:   FU with me PRN      Signed, Jenkins Rouge, MD  10/17/2015 5:05 PM    [facility] [place], [place], [place]  [address] Phone: [phone]; Fax: [phone]

## 2015-10-17 NOTE — Patient Instructions (Addendum)
Medication Instructions:  Your physician recommends that you continue on your current medications as directed. Please refer to the Current Medication list given to you today.  Labwork: NONE  Testing/Procedures: NONE  Follow-Up: Your physician wants you to follow-up in: 2 months with Dr. Johnsie Cancel.   You have been referred to Neurology rehab for vestibular balance.   If you need a refill on your cardiac medications before your next appointment, please call your pharmacy.

## 2015-10-23 NOTE — Procedures (Signed)
   [facility]  EEG (ELECTROENCEPHALOGRAM) REPORT   STUDY DATE: 10/16/15 PATIENT NAME: Ricky Rowland. DOB: 28-Apr-1945 MRN: [id number]  ORDERING CLINICIAN: Andrey Spearman, MD   TECHNOLOGIST: Laretta Alstrom  TECHNIQUE: Electroencephalogram was recorded utilizing standard 10-20 system of lead placement and reformatted into average and bipolar montages.  RECORDING TIME: 26 minutes ACTIVATION: hyperventilation and photic stimulation  CLINICAL INFORMATION: 70 year old male with intermittent lightheadedness.  FINDINGS: Background rhythms of 9-10 hertz and 20-30 microvolts. No focal, lateralizing, epileptiform activity or seizures are seen. Patient recorded in the awake and drowsy state. EKG channel shows regular rhythm of 60-70 beats per minute.  IMPRESSION:  Normal EEG in the awake and drowsy states.    INTERPRETING PHYSICIAN:  Penni Bombard, MD Certified in Neurology, Neurophysiology and Neuroimaging  [facility] [address], [place], [place] [address] [phone]

## 2015-10-24 ENCOUNTER — Telehealth: Payer: Self-pay | Admitting: *Deleted

## 2015-10-24 NOTE — Telephone Encounter (Signed)
Per Dr Leta Baptist, LVM informing patient his EEG results are normal and to continue to monitor his BP with any events. Reminded him of his FU 11/13/15 and advised he call for any questions, needs. Left name, number.

## 2015-10-25 ENCOUNTER — Ambulatory Visit: Payer: Medicare Other | Attending: Cardiovascular Disease | Admitting: Physical Therapy

## 2015-10-25 ENCOUNTER — Encounter: Payer: Self-pay | Admitting: Physical Therapy

## 2015-10-25 DIAGNOSIS — R42 Dizziness and giddiness: Secondary | ICD-10-CM | POA: Diagnosis not present

## 2015-10-25 NOTE — Therapy (Signed)
[facility] [address] [place], [place], [address] Phone: [phone]   Fax:  [phone]  Physical Therapy Evaluation  Patient Details  Name: Ricky QUADE Sr. MRN: [id number] Date of Birth: 01/31/1946 Referring Provider: Jenkins Rouge, MD  Encounter Date: 10/25/2015      PT End of Session - 10/25/15 0953    Visit Number 1   Number of Visits 1   Authorization Type U[employer] MCR   PT Start Time 0801   PT Stop Time 0840   PT Time Calculation (min) 39 min   Activity Tolerance Patient tolerated treatment well   Behavior During Therapy [facility] for tasks assessed/performed      Past Medical History:  Diagnosis Date  . Borderline diabetes    DIET CONTROLLED  . Elevated PSA   . External carotid artery stenosis    MILD-- BILATERAL  . Gout, arthritis   . Hearing loss in left ear   . Heart murmur    MILD --  ASYMPTOMATIC  . History of gastric ulcer    REMOTE HX  . Hypertension   . Prostate cancer ([place]) 12/21/12   Gleason 3+4=7  . Wears glasses     Past Surgical History:  Procedure Laterality Date  . KNEE ARTHROSCOPY W/ MENISCAL REPAIR Left JAN 2014  . [place]  . PROSTATE BIOPSY N/A 12/21/2012   Procedure: BIOPSY TRANSRECTAL ULTRASONIC PROSTATE (TUBP);  Surgeon: [name], MD;  Location: [facility];  Service: Urology;  Laterality: N/A;  . PROSTATE BIOPSY  11/26/11   benign    There were no vitals filed for this visit.       Subjective Assessment - 10/25/15 0807    Subjective Pt has been experiencing several episodes of ligthheadedness over the past 3 years. Accompanied by nausea, diarrhea, and profuse sweating. Sporadic occurences. Sometimes occurs once every 2 days, sometimes every 6 months. Pt also notes that he suspects this may be due to BP medication; so pt has modified BP meds so that he takes one at 10 am and one at 10 pm (rather than both meds at 10 pm, as pt was  originally directed). Pt has been following this regimen for the past week and feels as though it may be helping symptoms. Pt has not consulted MD about this.   Pertinent History Likes to be called "TJ".  PMH significant for: hearing loss in L ear, HTN, HLD, prostate cancer, gout, L meniscus tear s/p arthroscopy (2014) , lumbar spine surgery (1991)   Diagnostic tests MRI of brain (2016): Chronic right basal ganglia lacunar infarcts   Patient Stated Goals To get rid of lightheadedness and nausea.   Currently in Pain? 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  No   Is the patient reluctant to leave their home because of a fear of falling?  No     Home Environment   Living Environment  Private residence   Living Arrangements Spouse/significant other   Type of [place] to enter   Entrance Stairs-Number of Steps 28   Entrance Stairs-Rails Can reach both   [place] Two level   Alternate Level Stairs-Number of Steps 14   Alternate Level Stairs-Rails Can reach both   [place] None   Additional Comments Has a chair lift in home due to mother-in-law needing it before she passed away     Prior Function   Level of Independence Independent   Vocation Retired   Leisure play golf     Cognition   Overall Cognitive Status Within Functional Limits for tasks assessed            Vestibular Assessment - 10/25/15 0001      Vestibular Assessment   General Observation Has not taken meclizine since 10/17/15.     Symptom Behavior   Type of  Dizziness Lightheadedness   Frequency of Dizziness sporadic   Duration of Dizziness acute episode of nausea, lightheadedness, and sweating for a few hours followed by 1-1.5 days of low-level nausea    Aggravating Factors No known aggravating factors   Relieving Factors Lying supine;Rest     Occulomotor Exam   Occulomotor Alignment Normal  slight head tilt to R at rest   Spontaneous Absent   Gaze-induced Absent   Smooth Pursuits Intact   Saccades Intact  small head movement to R with saccades to R of midline   Comment (+) L Head Thrust Test but asymptomatic     Vestibulo-Occular Reflex   VOR 1 Head Only (x 1 viewing) Appears grossly WNL; pt reports no symptoms.   Comment Convergence appears WNL.     Positional Testing   Sidelying Test Sidelying Right;Sidelying Left   Horizontal Canal Testing Horizontal Canal Right;Horizontal Canal Left     Sidelying Right   Sidelying Right Duration NA   Sidelying Right Symptoms No nystagmus     Sidelying Left   Sidelying Left Duration NA   Sidelying Left Symptoms No nystagmus     Horizontal Canal Right   Horizontal Canal Right Duration NA   Horizontal Canal Right Symptoms Normal     Horizontal Canal Left   Horizontal Canal Left Duration NA   Horizontal Canal Left Symptoms Normal     Orthostatics   BP supine (x 5 minutes) 154/76  large BP cuff; L arm   HR supine (x 5 minutes) 66   BP standing (after 1 minute) 99/70  mild concordant lightheadedness   HR standing (after 1 minute) 79   BP standing (after 3 minutes) 139/75  lightheadedness resolved   HR standing (after 3 minutes) 73                       PT Education - 10/25/15 0950    Education provided Yes   Education Details Explained vestibular evaluation findings and DC plan, as patient does not appear to have vestibular impairments. Explained postural hypotension and advised that referring MD will be notified of findings today. Also recommended that pt consult  cardiologist prior to modifying the time he takes BP medication.   Person(s) Educated Patient   Methods Explanation   Comprehension Verbalized understanding                    Plan - 10/25/15 0954    Clinical Impression Statement Pt is a 70 y/o M referred to vestibular PT to  address episodic lightheadedness.  PMH significant for: hearing loss in L ear, HTN, HLD, prostate cancer, gout, L meniscus tear s/p arthroscopy (2014) , lumbar spine surgery (1991). Vestibular, oculomotor assessments reveal no significant impairments and did not reproduce concordant lightheadedness. Orthostatic vital signs: supine x5 minutes with BP 154/76, HR 66;  standing x1 minute with BP 99/70, HR 79 with very mild concordant lightheadedness. Patient notes that he has modified BP meds so that he takes one at 10 am and one at 10 pm (rather than both meds at 10 pm, as pt was originally directed). This Pt recommended that pt consult cardiologist prior to modifying timing of BP medication. Pt verbalized understanding. Symptoms do not appear to be of vestibular origin. Pt therefore does not require skilled vestibular PT services at this time.    PT Frequency One time visit   Consulted and Agree with Plan of Care Patient      Patient will benefit from skilled therapeutic intervention in order to improve the following deficits and impairments:  Dizziness  Visit Diagnosis: Dizziness and giddiness - Plan: PT plan of care cert/re-cert      G-Codes - [id number]    Functional Assessment Tool Used DHI = 12   Functional Limitation Self care   Self Care Current Status [id number]) At least 1 percent but less than 20 percent impaired, limited or restricted   Self Care Goal Status [id number]) At least 1 percent but less than 20 percent impaired, limited or restricted   Self Care Discharge Status [phone]) At least 1 percent but less than 20 percent impaired, limited or restricted       Problem List Patient Active Problem  List   Diagnosis Date Noted  . Proteinuria 07/26/2013  . History of prostate cancer 05/02/2013  . 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 MRN: [id number] Date of Birth: Dec 26, 1945

## 2015-11-13 ENCOUNTER — Ambulatory Visit (INDEPENDENT_AMBULATORY_CARE_PROVIDER_SITE_OTHER): Payer: Medicare Other | Admitting: Diagnostic Neuroimaging

## 2015-11-13 ENCOUNTER — Encounter: Payer: Self-pay | Admitting: Diagnostic Neuroimaging

## 2015-11-13 VITALS — BP 146/74 | HR 66 | Wt 217.8 lb

## 2015-11-13 DIAGNOSIS — I951 Orthostatic hypotension: Secondary | ICD-10-CM | POA: Diagnosis not present

## 2015-11-13 DIAGNOSIS — R42 Dizziness and giddiness: Secondary | ICD-10-CM

## 2015-11-13 NOTE — Progress Notes (Signed)
[facility]  PATIENT: Ricky BUESO Sr. DOB: 04/24/1945  REFERRING CLINICIAN: Alfonse Spruce HISTORY FROM: patient and wife  REASON FOR VISIT: follow up   HISTORICAL  CHIEF COMPLAINT:  Chief Complaint  Patient presents with  . Other    rm 7, nausea/vomiting,  wife -Mikle Bosworth, "saw cardiologist, no vertigo, issue possibly hypertension"  . Follow-up    2 month    HISTORY OF PRESENT ILLNESS:   UPDATE 11/13/15: Since last visit, doing well. Saw cardiology, who referred pt to vestibular PT, who found orthostatic hypotension (SBP 156 --> 99). Now BP better controlled / regulated, and patient not having any more events.   PRIOR HPI (09/13/15): 70 year old male with hypertension, hypercholesteremia, prostate cancer, here for valuation of dizziness attacks. Symptoms started in 2014. Patient has 8 to 12 episodes per year. No specific triggering factors. Patient had one attack last week and another one the week before. Previously last attack was in November 2016. Patient describes a sensation of intestine, stomach upset sensation, overheating sensation, severe sweats, lightheadedness and nausea. Sometimes patient vomits. Patient feels off balance during these attacks. No unilateral numbness, tingling, slurred speech, double vision or headaches with the attacks. Patient typically tries to make it home, lay down and go to sleep. If patient vomits or has a bowel movement after the tachycardia started, seems to relieve the sensation and symptom. If patient is laying flat after an attack, if he tries to sit up symptoms are exacerbated. Patient has never measured his blood pressure during these attacks. Patient has got to the emergency room for several of these attacks while in the emergency room hypotension has not been documented. Patient denies any palpitations, chest pain, shortness of breath. Episodes of occurred in standing or sitting position. Episodes of [place]. No specific triggering factors. In the past patient has had MRI of the brain, cardiac testing, heart monitor, nucleas medicine stress test, which appeared unremarkable. These testing were done 2-3 years ago.   REVIEW OF SYSTEMS: Full 14 system review of systems performed and negative except as per HPI.    ALLERGIES: Allergies  Allergen Reactions  . Atorvastatin Other (See Comments)    Joint pain   . Statins Other (See Comments)    Joint pain    HOME MEDICATIONS: Outpatient Medications Prior to Visit  Medication Sig Dispense Refill  . acetaminophen (TYLENOL) 500 MG tablet Take 500 mg by mouth every 6 (six) hours as needed (pain).    Marland Kitchen allopurinol (ZYLOPRIM) 300 MG tablet Take 300 mg by mouth at bedtime.     Marland Kitchen amLODipine-valsartan (EXFORGE) 5-320 MG per tablet Take 1 tablet by mouth at bedtime.     . colchicine 0.6 MG tablet Take 0.6 mg by mouth 2 (two) times daily as needed (for gout).     . meclizine (ANTIVERT) 25 MG tablet Take 1 tablet (25 mg total) by mouth 3 (three) times daily as needed for dizziness. 30 tablet 0  . metoprolol succinate (TOPROL-XL) 50 MG 24 hr tablet Take 50 mg by mouth daily.    . Naphazoline HCl (CLEAR EYES OP) Place 1 drop into both eyes See admin instructions. Instill 1 drop into both eyes every morning, may also use 1 drop both eyes later in the day as needed for dry eyes/irritation    . omeprazole (PRILOSEC) 20 MG capsule Take 20 mg by mouth 2 (two) times daily.   0  . ondansetron (ZOFRAN) 4 MG  tablet Take 1 tablet (4 mg total) by mouth every 8 (eight) hours as needed for nausea or vomiting. 30 tablet 0  . tamsulosin (FLOMAX) 0.4 MG CAPS capsule Take 0.4 mg by mouth at bedtime.     Marland Kitchen tolnaftate (TINACTIN) 1 % cream Apply 1 application topically daily as needed (jock itch).     No facility-administered medications prior to visit.     PAST MEDICAL HISTORY: Past Medical History:  Diagnosis Date  . Borderline diabetes    DIET CONTROLLED    . Elevated PSA   . External carotid artery stenosis    MILD-- BILATERAL  . Gout, arthritis   . Hearing loss in left ear   . Heart murmur    MILD --  ASYMPTOMATIC  . History of gastric ulcer    REMOTE HX  . Hypertension   . Prostate cancer ([place]) 12/21/12   Gleason 3+4=7  . Wears glasses     PAST SURGICAL HISTORY: Past Surgical History:  Procedure Laterality Date  . KNEE ARTHROSCOPY W/ MENISCAL REPAIR Left JAN 2014  . [place]  . PROSTATE BIOPSY N/A 12/21/2012   Procedure: BIOPSY TRANSRECTAL ULTRASONIC PROSTATE (TUBP);  Surgeon: [name], MD;  Location: [facility];  Service: Urology;  Laterality: N/A;  . PROSTATE BIOPSY  11/26/11   benign    FAMILY HISTORY: Family History  Problem Relation Age of Onset  . Cancer Sister     lung  . Cancer Brother     prostate    SOCIAL HISTORY:  Social History   Social History  . Marital status: Married    Spouse name: geraldine  . Number of children: 3  . Years of education: some colle   Occupational History  . retired    Social History Main Topics  . Smoking status: Former Smoker    Packs/day: 1.00    Years: 30.00    Types: Cigarettes    Quit date: 12/15/1997  . Smokeless tobacco: Never Used  . Alcohol use Yes     Comment: OCCASIONAL  . Drug use: No  . Sexual activity: Not on file   Other Topics Concern  . Not on file   Social History Narrative   Lives with wife at home    caffeine use- occas, mostly decaf coffee     PHYSICAL EXAM   GENERAL EXAM/CONSTITUTIONAL: Vitals:  Vitals:   11/13/15 1301  BP: (!) 146/74  Pulse: 66  Weight: 217 lb 12.8 oz (98.8 kg)    No data found.   Body mass index is 27.22 kg/m. No exam data present  Patient is in no distress; well developed, nourished and groomed; neck is supple  CARDIOVASCULAR:  Examination of carotid arteries is normal; no carotid bruits  Regular rate and rhythm, no murmurs  Examination of peripheral  vascular system by observation and palpation is normal  EYES:  Ophthalmoscopic exam of optic discs and posterior segments is normal; no papilledema or hemorrhages  MUSCULOSKELETAL:  Gait, strength, tone, movements noted in Neurologic exam below  NEUROLOGIC: MENTAL STATUS:  No flowsheet data found.  awake, alert, oriented to person, place and time  recent and remote memory intact  normal attention and concentration  language fluent, comprehension intact, naming intact,   fund of knowledge appropriate  CRANIAL NERVE:   2nd - no papilledema on fundoscopic exam  2nd, 3rd, 4th, 6th - pupils equal and reactive to light, visual fields full to confrontation, extraocular muscles intact, no nystagmus  5th - facial sensation symmetric  7th - facial strength symmetric  8th - hearing intact  9th - palate elevates symmetrically, uvula midline  11th - shoulder shrug symmetric  12th - tongue protrusion midline  MOTOR:   normal bulk and tone, full strength in the BUE, BLE  SENSORY:   normal and symmetric to light touch, temperature, vibration  COORDINATION:   finger-nose-finger, fine finger movements normal  REFLEXES:   deep tendon reflexes present and symmetric  GAIT/STATION:   narrow based gait; romberg is negative    DIAGNOSTIC DATA (LABS, IMAGING, TESTING) - I reviewed patient records, labs, notes, testing and imaging myself where available.  Lab Results  Component Value Date   WBC 8.5 09/07/2015   HGB 15.1 09/07/2015   HCT 45.3 09/07/2015   MCV 94.6 09/07/2015   PLT 202 09/07/2015      Component Value Date/Time   NA 136 09/07/2015 1806   NA 140 05/22/2015 1022   K 4.2 09/07/2015 1806   K 4.0 05/22/2015 1022   CL 102 09/07/2015 1806   CO2 22 09/07/2015 1806   CO2 26 05/22/2015 1022   GLUCOSE 147 (H) 09/07/2015 1806   GLUCOSE 128 05/22/2015 1022   BUN 24 (H) 09/07/2015 1806   BUN 23.7 05/22/2015 1022   CREATININE 1.47 (H) 09/07/2015 1806    CREATININE 1.3 05/22/2015 1022   CALCIUM 9.9 09/07/2015 1806   CALCIUM 9.2 05/22/2015 1022   PROT 7.3 05/22/2015 1022   ALBUMIN 3.6 05/22/2015 1022   AST 28 05/22/2015 1022   ALT 35 05/22/2015 1022   ALKPHOS 83 05/22/2015 1022   BILITOT <0.30 05/22/2015 1022   GFRNONAA 46 (L) 09/07/2015 1806   GFRAA 53 (L) 09/07/2015 1806   No results found for: CHOL, HDL, LDLCALC, LDLDIRECT, TRIG, CHOLHDL No results found for: HGBA1C No results found for: VITAMINB12 No results found for: TSH  01/10/13 nuclear stress test 1. No fixed or reversible defects to suggest inducible ischemia. 2. Normal left ventricular wall motion and thickening. 3. Normal ejection fraction.  02/03/15 MRI brain (with and without) [I reviewed images myself and agree with interpretation. -VRP]  1. No acute intracranial abnormality or mass. 2. Chronic right basal ganglia lacunar infarcts. 3. Mild cerebral and cerebellar atrophy.  09/07/15 EKG [I reviewed images myself and agree with interpretation. -VRP]  - normal sinus rhythm  10/23/15 EEG  - normal     ASSESSMENT AND PLAN  70 y.o. year old male here with intermittent unprovoked episodes of lightheadedness, presyncope, nausea, diaphoresis. Findings suspicious for generalized hypotension / hypoperfusion events. This was documented with PT evaluation.    Dx: orthostatic hypotension  1. Episodic lightheadedness   2. Orthostatic hypotension      PLAN: - monitor symptoms; check BP at home with any future events  Return if symptoms worsen or fail to improve, for return to PCP.    Penni Bombard, MD [id number], [id number] PM Certified in Neurology, Neurophysiology and Neuroimaging  [facility] [address], [place], [place] [address] [phone]

## 2015-11-13 NOTE — Patient Instructions (Signed)
-   continue current medications  - monitor BP at home

## 2015-11-29 ENCOUNTER — Encounter: Payer: Self-pay | Admitting: Cardiovascular Disease

## 2015-12-17 ENCOUNTER — Ambulatory Visit: Payer: Medicare Other | Admitting: Cardiovascular Disease

## 2016-06-17 ENCOUNTER — Other Ambulatory Visit: Payer: Medicare Other

## 2016-06-18 ENCOUNTER — Other Ambulatory Visit: Payer: Medicare Other

## 2016-06-18 ENCOUNTER — Other Ambulatory Visit (HOSPITAL_BASED_OUTPATIENT_CLINIC_OR_DEPARTMENT_OTHER): Payer: Medicare Other

## 2016-06-18 DIAGNOSIS — Z8546 Personal history of malignant neoplasm of prostate: Secondary | ICD-10-CM

## 2016-06-18 DIAGNOSIS — D472 Monoclonal gammopathy: Secondary | ICD-10-CM | POA: Diagnosis not present

## 2016-06-18 LAB — CBC WITH DIFFERENTIAL/PLATELET
BASO%: 1.4 % (ref 0.0–2.0)
BASOS ABS: 0.1 10*3/uL (ref 0.0–0.1)
EOS ABS: 0.2 10*3/uL (ref 0.0–0.5)
EOS%: 4.3 % (ref 0.0–7.0)
HEMATOCRIT: 46 % (ref 38.4–49.9)
HEMOGLOBIN: 15.6 g/dL (ref 13.0–17.1)
LYMPH#: 1.7 10*3/uL (ref 0.9–3.3)
LYMPH%: 30.9 % (ref 14.0–49.0)
MCH: 32.3 pg (ref 27.2–33.4)
MCHC: 33.8 g/dL (ref 32.0–36.0)
MCV: 95.3 fL (ref 79.3–98.0)
MONO#: 0.4 10*3/uL (ref 0.1–0.9)
MONO%: 6.8 % (ref 0.0–14.0)
NEUT#: 3.1 10*3/uL (ref 1.5–6.5)
NEUT%: 56.6 % (ref 39.0–75.0)
PLATELETS: 180 10*3/uL (ref 140–400)
RBC: 4.82 10*6/uL (ref 4.20–5.82)
RDW: 14.5 % (ref 11.0–14.6)
WBC: 5.5 10*3/uL (ref 4.0–10.3)

## 2016-06-18 LAB — COMPREHENSIVE METABOLIC PANEL
ALBUMIN: 4 g/dL (ref 3.5–5.0)
ALK PHOS: 79 U/L (ref 40–150)
ALT: 41 U/L (ref 0–55)
ANION GAP: 12 meq/L — AB (ref 3–11)
AST: 43 U/L — ABNORMAL HIGH (ref 5–34)
BUN: 14.7 mg/dL (ref 7.0–26.0)
CALCIUM: 10.1 mg/dL (ref 8.4–10.4)
CO2: 25 mEq/L (ref 22–29)
Chloride: 103 mEq/L (ref 98–109)
Creatinine: 1.1 mg/dL (ref 0.7–1.3)
EGFR: 77 mL/min/{1.73_m2} — AB (ref >90)
Glucose: 126 mg/dl (ref 70–140)
POTASSIUM: 4.2 meq/L (ref 3.5–5.1)
Sodium: 140 mEq/L (ref 136–145)
Total Bilirubin: 0.73 mg/dL (ref 0.20–1.20)
Total Protein: 7.8 g/dL (ref 6.4–8.3)

## 2016-06-18 LAB — LACTATE DEHYDROGENASE: LDH: 227 U/L (ref 125–245)

## 2016-06-19 LAB — IGG, IGA, IGM
IGA/IMMUNOGLOBULIN A, SERUM: 325 mg/dL (ref 61–437)
IGG (IMMUNOGLOBIN G), SERUM: 954 mg/dL (ref 700–1600)
IGM (IMMUNOGLOBIN M), SRM: 80 mg/dL (ref 15–143)

## 2016-06-19 LAB — KAPPA/LAMBDA LIGHT CHAINS
IG KAPPA FREE LIGHT CHAIN: 37.8 mg/L — AB (ref 3.3–19.4)
IG LAMBDA FREE LIGHT CHAIN: 26.6 mg/L — AB (ref 5.7–26.3)
Kappa/Lambda FluidC Ratio: 1.42 (ref 0.26–1.65)

## 2016-06-19 LAB — BETA 2 MICROGLOBULIN, SERUM: Beta-2: 1.9 mg/L (ref 0.6–2.4)

## 2016-06-19 LAB — PSA: Prostate Specific Ag, Serum: 0.2 ng/mL (ref 0.0–4.0)

## 2016-06-24 ENCOUNTER — Ambulatory Visit: Payer: Medicare Other | Admitting: Internal Medicine

## 2016-07-10 ENCOUNTER — Encounter: Payer: Self-pay | Admitting: Internal Medicine

## 2016-07-10 ENCOUNTER — Ambulatory Visit (HOSPITAL_BASED_OUTPATIENT_CLINIC_OR_DEPARTMENT_OTHER): Payer: Medicare Other | Admitting: Internal Medicine

## 2016-07-10 ENCOUNTER — Telehealth: Payer: Self-pay | Admitting: Internal Medicine

## 2016-07-10 VITALS — BP 154/77 | HR 73 | Temp 98.0°F | Resp 18 | Ht 75.0 in | Wt 215.7 lb

## 2016-07-10 DIAGNOSIS — D472 Monoclonal gammopathy: Secondary | ICD-10-CM | POA: Diagnosis not present

## 2016-07-10 DIAGNOSIS — Z8546 Personal history of malignant neoplasm of prostate: Secondary | ICD-10-CM | POA: Diagnosis not present

## 2016-07-10 NOTE — Progress Notes (Signed)
[place] [phone]   [phone] OFFICE PROGRESS NOTE  Kandice Hams, MD [address] [place] [address]  DIAGNOSIS:  1) Monoclonal gammopathy of undetermined significance (MGUS). 2) History of prostate cancer T1C followed by Dr. [name],  status post curative radiotherapy under the care of Dr. [name].  PRIOR THERAPY: Curative radiotherapy to the prostate under the care of Dr. [name].  CURRENT THERAPY: Observation.  INTERVAL HISTORY: Ricky BROGDEN Sr. 71 y.o. male returns to the clinic today for annual follow-up visit. The patient is feeling fine today was no specific complaints except for mild fatigue. He denied having any weight loss or night sweats. He has no nausea, vomiting, diarrhea or constipation. He has no fever or chills. He denied having any chest pain, shortness breath, cough or hemoptysis. He had repeat myeloma panel as well as PSA performed recently and he is here for evaluation and discussion of his lab results.   MEDICAL HISTORY: Past Medical History:  Diagnosis Date  . Borderline diabetes    DIET CONTROLLED  . Elevated PSA   . External carotid artery stenosis    MILD-- BILATERAL  . Gout, arthritis   . Hearing loss in left ear   . Heart murmur    MILD --  ASYMPTOMATIC  . History of gastric ulcer    REMOTE HX  . Hypertension   . Prostate cancer ([place]) 12/21/12   Gleason 3+4=7  . Wears glasses     ALLERGIES:  is allergic to atorvastatin and statins.  MEDICATIONS:  Current Outpatient Prescriptions  Medication Sig Dispense Refill  . acetaminophen (TYLENOL) 500 MG tablet Take 500 mg by mouth every 6 (six) hours as needed (pain).    Marland Kitchen allopurinol (ZYLOPRIM) 300 MG tablet Take 300 mg by mouth at bedtime.     Marland Kitchen amLODipine-valsartan (EXFORGE) 5-320 MG per tablet Take 1 tablet by mouth at bedtime.     . colchicine 0.6 MG tablet Take 0.6 mg by mouth 2 (two) times daily as needed (for gout).     .  meclizine (ANTIVERT) 25 MG tablet Take 1 tablet (25 mg total) by mouth 3 (three) times daily as needed for dizziness. 30 tablet 0  . metoprolol succinate (TOPROL-XL) 50 MG 24 hr tablet Take 50 mg by mouth daily.    . Naphazoline HCl (CLEAR EYES OP) Place 1 drop into both eyes See admin instructions. Instill 1 drop into both eyes every morning, may also use 1 drop both eyes later in the day as needed for dry eyes/irritation    . omeprazole (PRILOSEC) 20 MG capsule Take 20 mg by mouth 2 (two) times daily.   0  . ondansetron (ZOFRAN) 4 MG tablet Take 1 tablet (4 mg total) by mouth every 8 (eight) hours as needed for nausea or vomiting. 30 tablet 0  . tamsulosin (FLOMAX) 0.4 MG CAPS capsule Take 0.4 mg by mouth at bedtime.     Marland Kitchen tolnaftate (TINACTIN) 1 % cream Apply 1 application topically daily as needed (jock itch).     No current facility-administered medications for this visit.     SURGICAL HISTORY:  Past Surgical History:  Procedure Laterality Date  . KNEE ARTHROSCOPY W/ MENISCAL REPAIR Left JAN 2014  . [place]  . PROSTATE BIOPSY N/A 12/21/2012   Procedure: BIOPSY TRANSRECTAL ULTRASONIC PROSTATE (TUBP);  Surgeon: [name], MD;  [facility]: [facility];  Service: Urology;  Laterality: N/A;  .  PROSTATE BIOPSY  11/26/11   benign    REVIEW OF SYSTEMS:  A comprehensive review of systems was negative except for: Constitutional: positive for fatigue   PHYSICAL EXAMINATION: General appearance: alert, cooperative and no distress Head: Normocephalic, without obvious abnormality, atraumatic Neck: no adenopathy, no JVD, supple, symmetrical, trachea midline and thyroid not enlarged, symmetric, no tenderness/mass/nodules Lymph nodes: Cervical, supraclavicular, and axillary nodes normal. Resp: clear to auscultation bilaterally Back: symmetric, no curvature. ROM normal. No CVA tenderness. Cardio: regular rate and rhythm, S1, S2 normal, no murmur, click, rub or  gallop GI: soft, non-tender; bowel sounds normal; no masses,  no organomegaly Extremities: extremities normal, atraumatic, no cyanosis or edema  ECOG PERFORMANCE STATUS: 1 - Symptomatic but completely ambulatory  Blood pressure (!) 154/77, pulse 73, temperature 98 F (36.7 C), temperature source Oral, resp. rate 18, height 6\' 3"  (1.905 m), weight 215 lb 11.2 oz (97.8 kg), SpO2 99 %.  LABORATORY DATA: Lab Results  Component Value Date   WBC 5.5 06/18/2016   HGB 15.6 06/18/2016   HCT 46.0 06/18/2016   MCV 95.3 06/18/2016   PLT 180 06/18/2016      Chemistry      Component Value Date/Time   NA 140 06/18/2016 0857   K 4.2 06/18/2016 0857   CL 102 09/07/2015 1806   CO2 25 06/18/2016 0857   BUN 14.7 06/18/2016 0857   CREATININE 1.1 06/18/2016 0857      Component Value Date/Time   CALCIUM 10.1 06/18/2016 0857   ALKPHOS 79 06/18/2016 0857   AST 43 (H) 06/18/2016 0857   ALT 41 06/18/2016 0857   BILITOT 0.73 06/18/2016 0857     [facility] lab results LTS FINAL DIAGNOSIS ASSESSMENT AND PLAN:  This is a very pleasant 71 years old African-American male with monoclonal gammopathy of undetermined significance and has been observation for several years. The patient is doing fine and his recent myeloma panel showed no evidence for progression. I discussed the lab result with the patient today and recommended for him to continue on observation with repeat myeloma panel in one year. For the history of the prostate adenocarcinoma, I will continue to monitor his PSA with his routine blood work. He was advised to call immediately if he has any concerning symptoms in the interval. The patient voices understanding of current disease status and treatment options and is in agreement with the current care plan. All questions were answered. The patient knows to call the clinic with any problems, questions or concerns. We can certainly see the patient much sooner if necessary. I spent 10 minutes counseling  the patient face to face. The total time spent in the appointment was 15 minutes.  Disclaimer: This note was dictated with voice recognition software. Similar sounding words can inadvertently be transcribed and may not be corrected upon review.

## 2016-07-10 NOTE — Telephone Encounter (Signed)
Appointments scheduled per 5.3.18 LOS. Patient given AVS report and calendars with future scheduled appointments. °

## 2016-11-04 ENCOUNTER — Ambulatory Visit: Payer: Medicare Other | Admitting: Diagnostic Neuroimaging

## 2017-01-19 ENCOUNTER — Ambulatory Visit
Admission: RE | Admit: 2017-01-19 | Discharge: 2017-01-19 | Disposition: A | Discharge status: Home / Self Care | Payer: Medicare Other | Source: Ambulatory Visit | Attending: Nurse Practitioner | Admitting: Nurse Practitioner

## 2017-01-19 ENCOUNTER — Other Ambulatory Visit: Payer: Self-pay | Admitting: Nurse Practitioner

## 2017-01-19 DIAGNOSIS — W19XXXA Unspecified fall, initial encounter: Secondary | ICD-10-CM

## 2017-01-22 ENCOUNTER — Other Ambulatory Visit: Payer: Self-pay | Admitting: Internal Medicine

## 2017-01-22 DIAGNOSIS — I739 Peripheral vascular disease, unspecified: Secondary | ICD-10-CM

## 2017-01-26 ENCOUNTER — Ambulatory Visit
Admission: RE | Admit: 2017-01-26 | Discharge: 2017-01-26 | Disposition: A | Discharge status: Home / Self Care | Payer: Medicare Other | Source: Ambulatory Visit | Attending: Internal Medicine | Admitting: Internal Medicine

## 2017-01-26 DIAGNOSIS — I739 Peripheral vascular disease, unspecified: Secondary | ICD-10-CM

## 2017-05-21 ENCOUNTER — Other Ambulatory Visit: Payer: Self-pay | Admitting: Internal Medicine

## 2017-05-21 ENCOUNTER — Ambulatory Visit
Admission: RE | Admit: 2017-05-21 | Discharge: 2017-05-21 | Disposition: A | Discharge status: Home / Self Care | Payer: Medicare Other | Source: Ambulatory Visit | Attending: Internal Medicine | Admitting: Internal Medicine

## 2017-05-21 DIAGNOSIS — M25521 Pain in right elbow: Secondary | ICD-10-CM

## 2017-07-09 ENCOUNTER — Other Ambulatory Visit: Payer: Self-pay | Admitting: Internal Medicine

## 2017-07-09 ENCOUNTER — Inpatient Hospital Stay: Payer: Medicare Other | Attending: Internal Medicine

## 2017-07-09 DIAGNOSIS — Z8546 Personal history of malignant neoplasm of prostate: Secondary | ICD-10-CM | POA: Insufficient documentation

## 2017-07-09 DIAGNOSIS — D472 Monoclonal gammopathy: Secondary | ICD-10-CM | POA: Diagnosis not present

## 2017-07-09 DIAGNOSIS — R0989 Other specified symptoms and signs involving the circulatory and respiratory systems: Secondary | ICD-10-CM

## 2017-07-09 DIAGNOSIS — Z8719 Personal history of other diseases of the digestive system: Secondary | ICD-10-CM | POA: Diagnosis not present

## 2017-07-09 DIAGNOSIS — Z79899 Other long term (current) drug therapy: Secondary | ICD-10-CM | POA: Insufficient documentation

## 2017-07-09 DIAGNOSIS — Z923 Personal history of irradiation: Secondary | ICD-10-CM | POA: Diagnosis not present

## 2017-07-09 LAB — CBC WITH DIFFERENTIAL/PLATELET
Basophils Absolute: 0 10*3/uL (ref 0.0–0.1)
Basophils Relative: 0 %
EOS ABS: 0.2 10*3/uL (ref 0.0–0.5)
Eosinophils Relative: 4 %
HEMATOCRIT: 41.2 % (ref 38.4–49.9)
HEMOGLOBIN: 13.7 g/dL (ref 13.0–17.1)
LYMPHS ABS: 1.5 10*3/uL (ref 0.9–3.3)
LYMPHS PCT: 30 %
MCH: 32.5 pg (ref 27.2–33.4)
MCHC: 33.3 g/dL (ref 32.0–36.0)
MCV: 97.9 fL (ref 79.3–98.0)
MONOS PCT: 6 %
Monocytes Absolute: 0.3 10*3/uL (ref 0.1–0.9)
NEUTROS PCT: 60 %
Neutro Abs: 3 10*3/uL (ref 1.5–6.5)
Platelets: 140 10*3/uL (ref 140–400)
RBC: 4.21 MIL/uL (ref 4.20–5.82)
RDW: 13.7 % (ref 11.0–14.6)
WBC: 5 10*3/uL (ref 4.0–10.3)

## 2017-07-09 LAB — COMPREHENSIVE METABOLIC PANEL
ALT: 28 U/L (ref 0–55)
AST: 40 U/L — AB (ref 5–34)
Albumin: 4 g/dL (ref 3.5–5.0)
Alkaline Phosphatase: 70 U/L (ref 40–150)
Anion gap: 9 (ref 3–11)
BUN: 19 mg/dL (ref 7–26)
CHLORIDE: 108 mmol/L (ref 98–109)
CO2: 24 mmol/L (ref 22–29)
CREATININE: 1.08 mg/dL (ref 0.70–1.30)
Calcium: 9.4 mg/dL (ref 8.4–10.4)
GFR calc non Af Amer: >60 mL/min (ref >60)
Glucose, Bld: 96 mg/dL (ref 70–140)
POTASSIUM: 4.3 mmol/L (ref 3.5–5.1)
Sodium: 141 mmol/L (ref 136–145)
Total Bilirubin: 0.4 mg/dL (ref 0.2–1.2)
Total Protein: 7 g/dL (ref 6.4–8.3)

## 2017-07-09 LAB — LACTATE DEHYDROGENASE: LDH: 219 U/L (ref 125–245)

## 2017-07-10 LAB — PROSTATE-SPECIFIC AG, SERUM (LABCORP): PROSTATE SPECIFIC AG, SERUM: 0.1 ng/mL (ref 0.0–4.0)

## 2017-07-10 LAB — KAPPA/LAMBDA LIGHT CHAINS
KAPPA FREE LGHT CHN: 50.6 mg/L — AB (ref 3.3–19.4)
KAPPA, LAMDA LIGHT CHAIN RATIO: 1.63 (ref 0.26–1.65)
Lambda free light chains: 31.1 mg/L — ABNORMAL HIGH (ref 5.7–26.3)

## 2017-07-10 LAB — IGG, IGA, IGM
IGA: 343 mg/dL (ref 61–437)
IGG (IMMUNOGLOBIN G), SERUM: 1072 mg/dL (ref 700–1600)
IgM (Immunoglobulin M), Srm: 85 mg/dL (ref 15–143)

## 2017-07-10 LAB — BETA 2 MICROGLOBULIN, SERUM: Beta-2 Microglobulin: 1.8 mg/L (ref 0.6–2.4)

## 2017-07-16 ENCOUNTER — Telehealth: Payer: Self-pay

## 2017-07-16 ENCOUNTER — Inpatient Hospital Stay: Payer: Medicare Other | Admitting: Internal Medicine

## 2017-07-16 ENCOUNTER — Encounter: Payer: Self-pay | Admitting: Internal Medicine

## 2017-07-16 VITALS — BP 143/63 | HR 70 | Temp 99.2°F | Resp 18 | Ht 75.0 in | Wt 214.7 lb

## 2017-07-16 DIAGNOSIS — Z8546 Personal history of malignant neoplasm of prostate: Secondary | ICD-10-CM

## 2017-07-16 DIAGNOSIS — Z8719 Personal history of other diseases of the digestive system: Secondary | ICD-10-CM

## 2017-07-16 DIAGNOSIS — Z923 Personal history of irradiation: Secondary | ICD-10-CM | POA: Diagnosis not present

## 2017-07-16 DIAGNOSIS — D472 Monoclonal gammopathy: Secondary | ICD-10-CM

## 2017-07-16 DIAGNOSIS — Z79899 Other long term (current) drug therapy: Secondary | ICD-10-CM

## 2017-07-16 NOTE — Telephone Encounter (Signed)
Printed avs and calender of upcoming appointment. Per 5/9 los 

## 2017-07-16 NOTE — Progress Notes (Signed)
[place] [phone]   Fax:([phone] OFFICE PROGRESS NOTE  [name], MD [address] [name] [address]  DIAGNOSIS:  1) Monoclonal gammopathy of undetermined significance (MGUS). 2) History of prostate cancer T1C followed by Dr. [name],  status post curative radiotherapy under the care of Dr. [name].  PRIOR THERAPY: Curative radiotherapy to the prostate under the care of Dr. [name].  CURRENT THERAPY: Observation.  INTERVAL HISTORY: Ricky FECTEAU Sr. 72 y.o. male returns to the clinic today for annual follow-up visit.  The patient is feeling fine today with no specific complaints.  He denied having any chest pain, shortness of breath, cough or hemoptysis.  He denied having any weight loss or night sweats.  He has no nausea, vomiting, diarrhea or constipation.  He denied having any fever or chills.  He has no headache or visual changes.  The patient had repeat myeloma panel performed recently and is here for evaluation and discussion of his lab results.  MEDICAL HISTORY: Past Medical History:  Diagnosis Date  . Borderline diabetes    DIET CONTROLLED  . Elevated PSA   . External carotid artery stenosis    MILD-- BILATERAL  . Gout, arthritis   . Hearing loss in left ear   . Heart murmur    MILD --  ASYMPTOMATIC  . History of gastric ulcer    REMOTE HX  . Hypertension   . Prostate cancer ([place]) 12/21/12   Gleason 3+4=7  . Wears glasses     ALLERGIES:  is allergic to atorvastatin and statins.  MEDICATIONS:  Current Outpatient Medications  Medication Sig Dispense Refill  . acetaminophen (TYLENOL) 500 MG tablet Take 500 mg by mouth every 6 (six) hours as needed (pain).    Marland Kitchen allopurinol (ZYLOPRIM) 300 MG tablet Take 300 mg by mouth at bedtime.     Marland Kitchen amLODipine-valsartan (EXFORGE) 5-320 MG per tablet Take 1 tablet by mouth at bedtime.     . colchicine 0.6 MG tablet Take 0.6 mg by mouth 2 (two) times daily as  needed (for gout).     . meclizine (ANTIVERT) 25 MG tablet Take 1 tablet (25 mg total) by mouth 3 (three) times daily as needed for dizziness. 30 tablet 0  . Naphazoline HCl (CLEAR EYES OP) Place 1 drop into both eyes See admin instructions. Instill 1 drop into both eyes every morning, may also use 1 drop both eyes later in the day as needed for dry eyes/irritation    . omeprazole (PRILOSEC) 20 MG capsule Take 20 mg by mouth 2 (two) times daily.   0  . ondansetron (ZOFRAN) 4 MG tablet Take 1 tablet (4 mg total) by mouth every 8 (eight) hours as needed for nausea or vomiting. (Patient not taking: Reported on 07/10/2016) 30 tablet 0  . tolnaftate (TINACTIN) 1 % cream Apply 1 application topically daily as needed (jock itch).     No current facility-administered medications for this visit.     SURGICAL HISTORY:  Past Surgical History:  Procedure Laterality Date  . KNEE ARTHROSCOPY W/ MENISCAL REPAIR Left JAN 2014  . [place]  . PROSTATE BIOPSY N/A 12/21/2012   Procedure: BIOPSY TRANSRECTAL ULTRASONIC PROSTATE (TUBP);  Surgeon: [name], MD;  Location[facility];  Service: Urology;  Laterality: N/A;  . PROSTATE BIOPSY  11/26/11   benign    REVIEW OF SYSTEMS:  A comprehensive review of systems was negative.  PHYSICAL EXAMINATION: General appearance: alert, cooperative and no distress Head: Normocephalic, without obvious abnormality, atraumatic Neck: no adenopathy, no JVD, supple, symmetrical, trachea midline and thyroid not enlarged, symmetric, no tenderness/mass/nodules Lymph nodes: Cervical, supraclavicular, and axillary nodes normal. Resp: clear to auscultation bilaterally Back: symmetric, no curvature. ROM normal. No CVA tenderness. Cardio: regular rate and rhythm, S1, S2 normal, no murmur, click, rub or gallop GI: soft, non-tender; bowel sounds normal; no masses,  no organomegaly Extremities: extremities normal, atraumatic, no cyanosis or  edema  ECOG PERFORMANCE STATUS: 1 - Symptomatic but completely ambulatory  Blood pressure (!) 143/63, pulse 70, temperature 99.2 F (37.3 C), temperature source Oral, resp. rate 18, height 6\' 3"  (1.905 m), weight 214 lb 11.2 oz (97.4 kg), SpO2 98 %.  LABORATORY DATA: Lab Results  Component Value Date   WBC 5.0 07/09/2017   HGB 13.7 07/09/2017   HCT 41.2 07/09/2017   MCV 97.9 07/09/2017   PLT 140 07/09/2017      Chemistry      Component Value Date/Time   NA 141 07/09/2017 1008   NA 140 06/18/2016 0857   K 4.3 07/09/2017 1008   K 4.2 06/18/2016 0857   CL 108 07/09/2017 1008   CO2 24 07/09/2017 1008   CO2 25 06/18/2016 0857   BUN 19 07/09/2017 1008   BUN 14.7 06/18/2016 0857   CREATININE 1.08 07/09/2017 1008   CREATININE 1.1 06/18/2016 0857      Component Value Date/Time   CALCIUM 9.4 07/09/2017 1008   CALCIUM 10.1 06/18/2016 0857   ALKPHOS 70 07/09/2017 1008   ALKPHOS 79 06/18/2016 0857   AST 40 (H) 07/09/2017 1008   AST 43 (H) 06/18/2016 0857   ALT 28 07/09/2017 1008   ALT 41 06/18/2016 0857   BILITOT 0.4 07/09/2017 1008   BILITOT 0.73 06/18/2016 0857     [facility] lab results LTS FINAL DIAGNOSIS ASSESSMENT AND PLAN:  This is a very pleasant 73 years old African-American male with monoclonal gammopathy of undetermined significance and has been observation for several years.  The patient is currently feeling fine with no concerning complaints.  His myeloma panel showed no concerning findings for disease progression. His PSA is also within the normal range. I recommended for the patient to continue on observation with repeat myeloma panel and PSA in 1 year. He was advised to call immediately if he has any concerning symptoms in the interval. The patient voices understanding of current disease status and treatment options and is in agreement with the current care plan. All questions were answered. The patient knows to call the clinic with any problems, questions or  concerns. We can certainly see the patient much sooner if necessary. I spent 10 minutes counseling the patient face to face. The total time spent in the appointment was 15 minutes.  Disclaimer: This note was dictated with voice recognition software. Similar sounding words can inadvertently be transcribed and may not be corrected upon review.

## 2017-07-21 ENCOUNTER — Ambulatory Visit
Admission: RE | Admit: 2017-07-21 | Discharge: 2017-07-21 | Disposition: A | Discharge status: Home / Self Care | Payer: Medicare Other | Source: Ambulatory Visit | Attending: Internal Medicine | Admitting: Internal Medicine

## 2017-07-21 DIAGNOSIS — R0989 Other specified symptoms and signs involving the circulatory and respiratory systems: Secondary | ICD-10-CM

## 2017-09-28 ENCOUNTER — Encounter: Payer: Self-pay | Admitting: Surgery

## 2017-09-28 ENCOUNTER — Other Ambulatory Visit: Payer: Self-pay

## 2017-09-28 ENCOUNTER — Ambulatory Visit: Payer: Medicare Other | Admitting: Surgery

## 2017-09-28 VITALS — BP 154/81 | HR 65 | Resp 20 | Ht 75.0 in | Wt 209.3 lb

## 2017-09-28 DIAGNOSIS — I6523 Occlusion and stenosis of bilateral carotid arteries: Secondary | ICD-10-CM

## 2017-09-28 NOTE — Progress Notes (Signed)
[facility] [place]  Patient name: Ricky HENTON Sr. MRN: [id number] DOB: 1945-07-09 Sex: male   REQUESTING PROVIDER:    Dr. Delfina Redwood Carotid stenosis   REASON FOR CONSULT:    carotid  HISTORY OF PRESENT ILLNESS:   Ricky Rowland. is a 72 y.o. male, who is referred for evaluation of her carotid stenosis.  The patient recently had a ultrasound which revealed less than 50% carotid stenosis on the right and 50-69% stenosis on the left.  He is asymptomatic.  Specifically, he denies numbness or weakness in either extremity.  He denies slurred speech.  He denies amaurosis fugax.  Patient has a history of prostate cancer, status post curative radiotherapy.  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  He has had difficulty with statins for hypercholesterolemia and is now doing well on Repatha.  He has trouble with postural hypotension.  He is medically managed for hypertension.  He is a diabetic  PAST MEDICAL HISTORY    Past Medical History:  Diagnosis Date  . Borderline diabetes    DIET CONTROLLED  . Elevated PSA   . External carotid artery stenosis    MILD-- BILATERAL  . Gout, arthritis   . Hearing loss in left ear   . Heart murmur    MILD --  ASYMPTOMATIC  . History of gastric ulcer    REMOTE HX  . Hypertension   . Prostate cancer ([place]) 12/21/12   Gleason 3+4=7  . Wears glasses      FAMILY HISTORY   Family History  Problem Relation Age of Onset  . Heart disease Mother   . Heart disease Father   . Cancer Sister        lung  . Cancer Brother        prostate    SOCIAL HISTORY:   Social History   Socioeconomic History  . Marital status: Married    Spouse name: geraldine  . Number of children: 3  . Years of education: some colle  . Highest education level: Not on file  Occupational History  . Occupation: retired  Scientific laboratory technician  . Financial resource strain: Not on file    . Food insecurity:    Worry: Not on file    Inability: Not on file  . Transportation needs:    Medical: Not on file    Non-medical: Not on file  Tobacco Use  . Smoking status: Former Smoker    Packs/day: 1.00    Years: 30.00    Pack years: 30.00    Types: Cigarettes    Last attempt to quit: 12/15/1997    Years since quitting: 19.8  . Smokeless tobacco: Never Used  Substance and Sexual Activity  . Alcohol use: Yes    Comment: OCCASIONAL  . Drug use: No  . Sexual activity: Not on file  Lifestyle  . Physical activity:    Days per week: Not on file    Minutes per session: Not on file  . Stress: Not on file  Relationships  . Social connections:    Talks on phone: Not on file    Gets together: Not on file    Attends religious service: Not on file    Active member of club or organization: Not on file    Attends meetings of clubs or organizations: Not on file    Relationship status: Not on file  . Intimate partner violence:    Fear of current or ex  partner: Not on file    Emotionally abused: Not on file    Physically abused: Not on file    Forced sexual activity: Not on file  Other Topics Concern  . Not on file  Social History Narrative   Lives with wife at home    caffeine use- occas, mostly decaf coffee    ALLERGIES:    Allergies  Allergen Reactions  . Atorvastatin Other (See Comments)    Joint pain   . Statins Other (See Comments)    Joint pain    CURRENT MEDICATIONS:    Current Outpatient Medications  Medication Sig Dispense Refill  . acetaminophen (TYLENOL) 500 MG tablet Take 500 mg by mouth every 6 (six) hours as needed (pain).    Marland Kitchen allopurinol (ZYLOPRIM) 300 MG tablet Take 300 mg by mouth at bedtime.     Marland Kitchen amLODipine-valsartan (EXFORGE) 5-320 MG per tablet Take 1 tablet by mouth at bedtime.     . colchicine 0.6 MG tablet Take 0.6 mg by mouth 2 (two) times daily as needed (for gout).     . meclizine (ANTIVERT) 25 MG tablet Take 1 tablet (25 mg total) by  mouth 3 (three) times daily as needed for dizziness. 30 tablet 0  . Naphazoline HCl (CLEAR EYES OP) Place 1 drop into both eyes See admin instructions. Instill 1 drop into both eyes every morning, may also use 1 drop both eyes later in the day as needed for dry eyes/irritation    . omeprazole (PRILOSEC) 20 MG capsule Take 20 mg by mouth 2 (two) times daily.   0  . ondansetron (ZOFRAN) 4 MG tablet Take 1 tablet (4 mg total) by mouth every 8 (eight) hours as needed for nausea or vomiting. 30 tablet 0  . REPATHA SURECLICK 378 MG/ML SOAJ 588 mg every 21 ( twenty-one) days.    Marland Kitchen tolnaftate (TINACTIN) 1 % cream Apply 1 application topically daily as needed (jock itch).    . IBU 600 MG tablet Take 1 tablet by mouth 2 (two) times daily as needed.  0   No current facility-administered medications for this visit.     REVIEW OF SYSTEMS:   [X]  denotes positive finding, [ ]  denotes negative finding Cardiac  Comments:  Chest pain or chest pressure:    Shortness of breath upon exertion:    Short of breath when lying flat:    Irregular heart rhythm:        Vascular    Pain in calf, thigh, or hip brought on by ambulation:    Pain in feet at night that wakes you up from your sleep:     Blood clot in your veins:    Leg swelling:         Pulmonary    Oxygen at home:    Productive cough:     Wheezing:         Neurologic    Sudden weakness in arms or legs:     Sudden numbness in arms or legs:     Sudden onset of difficulty speaking or slurred speech:    Temporary loss of vision in one eye:     Problems with dizziness:  x       Gastrointestinal    Blood in stool:      Vomited blood:         Genitourinary    Burning when urinating:     Blood in urine:        Psychiatric  Major depression:         Hematologic    Bleeding problems:    Problems with blood clotting too easily:        Skin    Rashes or ulcers:        Constitutional    Fever or chills:     PHYSICAL EXAM:   Vitals:     09/28/17 1030 09/28/17 1031  BP: (!) 151/81 (!) 154/81  Pulse: 65   Resp: 20   SpO2: 98%   Weight: 209 lb 4.8 oz (94.9 kg)   Height: 6\' 3"  (1.905 m)     GENERAL: The patient is a well-nourished male, in no acute distress. The vital signs are documented above. CARDIAC: There is a regular rate and rhythm.  VASCULAR: Palpable pedal pulses.  I do not hear a carotid bruit today. PULMONARY: Nonlabored respirations ABDOMEN: Soft and non-tender.  No pulsatile mass MUSCULOSKELETAL: There are no major deformities or cyanosis. NEUROLOGIC: No focal weakness or paresthesias are detected. SKIN: There are no ulcers or rashes noted. PSYCHIATRIC: The patient has a normal affect.  STUDIES:   I have reviewed his ultrasound studies which show less than 50% right carotid stenosis and 50-69% left carotid stenosis  ASSESSMENT and PLAN   Asymptomatic bilateral carotid stenosis, left greater than right: I discussed the importance of medical management.  Currently the patient is on all of the appropriate medications.  He will need long-term surveillance of his carotid disease with intervention if he becomes symptomatic, or the stenosis becomes greater than 80%.  I will have his next ultrasound done in our office in 6 months.   Annamarie Major, MD [facility] [phone] Pager [phone]

## 2017-10-26 ENCOUNTER — Other Ambulatory Visit: Payer: Self-pay

## 2017-10-26 DIAGNOSIS — I6523 Occlusion and stenosis of bilateral carotid arteries: Secondary | ICD-10-CM

## 2017-11-05 ENCOUNTER — Emergency Department (HOSPITAL_COMMUNITY): Payer: Medicare Other

## 2017-11-05 ENCOUNTER — Other Ambulatory Visit: Payer: Self-pay

## 2017-11-05 ENCOUNTER — Ambulatory Visit (HOSPITAL_COMMUNITY): Admission: EM | Admit: 2017-11-05 | Discharge: 2017-11-05 | Payer: Medicare Other | Source: Home / Self Care

## 2017-11-05 ENCOUNTER — Encounter (HOSPITAL_COMMUNITY): Payer: Self-pay | Admitting: *Deleted

## 2017-11-05 ENCOUNTER — Observation Stay (HOSPITAL_COMMUNITY)
Admission: EM | Admit: 2017-11-05 | Discharge: 2017-11-06 | Disposition: A | Discharge status: Home / Self Care | Payer: Medicare Other | Attending: Internal Medicine | Admitting: Internal Medicine

## 2017-11-05 DIAGNOSIS — I6522 Occlusion and stenosis of left carotid artery: Secondary | ICD-10-CM | POA: Diagnosis present

## 2017-11-05 DIAGNOSIS — S0081XA Abrasion of other part of head, initial encounter: Secondary | ICD-10-CM | POA: Insufficient documentation

## 2017-11-05 DIAGNOSIS — Z87891 Personal history of nicotine dependence: Secondary | ICD-10-CM | POA: Insufficient documentation

## 2017-11-05 DIAGNOSIS — Y93K1 Activity, walking an animal: Secondary | ICD-10-CM | POA: Diagnosis not present

## 2017-11-05 DIAGNOSIS — D472 Monoclonal gammopathy: Secondary | ICD-10-CM | POA: Insufficient documentation

## 2017-11-05 DIAGNOSIS — I10 Essential (primary) hypertension: Secondary | ICD-10-CM | POA: Diagnosis present

## 2017-11-05 DIAGNOSIS — W1830XA Fall on same level, unspecified, initial encounter: Secondary | ICD-10-CM | POA: Insufficient documentation

## 2017-11-05 DIAGNOSIS — M109 Gout, unspecified: Secondary | ICD-10-CM | POA: Diagnosis not present

## 2017-11-05 DIAGNOSIS — R809 Proteinuria, unspecified: Secondary | ICD-10-CM | POA: Diagnosis present

## 2017-11-05 DIAGNOSIS — R2681 Unsteadiness on feet: Secondary | ICD-10-CM | POA: Insufficient documentation

## 2017-11-05 DIAGNOSIS — R55 Syncope and collapse: Secondary | ICD-10-CM | POA: Diagnosis not present

## 2017-11-05 DIAGNOSIS — R7303 Prediabetes: Secondary | ICD-10-CM | POA: Diagnosis present

## 2017-11-05 DIAGNOSIS — E785 Hyperlipidemia, unspecified: Secondary | ICD-10-CM | POA: Diagnosis present

## 2017-11-05 DIAGNOSIS — W19XXXA Unspecified fall, initial encounter: Secondary | ICD-10-CM

## 2017-11-05 DIAGNOSIS — K219 Gastro-esophageal reflux disease without esophagitis: Secondary | ICD-10-CM | POA: Diagnosis not present

## 2017-11-05 DIAGNOSIS — R42 Dizziness and giddiness: Secondary | ICD-10-CM | POA: Diagnosis not present

## 2017-11-05 HISTORY — DX: Unspecified fall, initial encounter: W19.XXXA

## 2017-11-05 HISTORY — DX: Gastro-esophageal reflux disease without esophagitis: K21.9

## 2017-11-05 HISTORY — DX: Hyperlipidemia, unspecified: E78.5

## 2017-11-05 HISTORY — DX: Prediabetes: R73.03

## 2017-11-05 LAB — CBC
HCT: 45.1 % (ref 39.0–52.0)
HEMOGLOBIN: 14.6 g/dL (ref 13.0–17.0)
MCH: 32.6 pg (ref 26.0–34.0)
MCHC: 32.4 g/dL (ref 30.0–36.0)
MCV: 100.7 fL — ABNORMAL HIGH (ref 78.0–100.0)
PLATELETS: 159 10*3/uL (ref 150–400)
RBC: 4.48 MIL/uL (ref 4.22–5.81)
RDW: 13.2 % (ref 11.5–15.5)
WBC: 7.2 10*3/uL (ref 4.0–10.5)

## 2017-11-05 LAB — URINALYSIS, ROUTINE W REFLEX MICROSCOPIC
Bacteria, UA: NONE SEEN
Bilirubin Urine: NEGATIVE
GLUCOSE, UA: NEGATIVE mg/dL
HGB URINE DIPSTICK: NEGATIVE
Ketones, ur: NEGATIVE mg/dL
Leukocytes, UA: NEGATIVE
NITRITE: NEGATIVE
PH: 6 (ref 5.0–8.0)
Protein, ur: 100 mg/dL — AB
SPECIFIC GRAVITY, URINE: 1.013 (ref 1.005–1.030)

## 2017-11-05 LAB — BASIC METABOLIC PANEL
ANION GAP: 10 (ref 5–15)
BUN: 18 mg/dL (ref 8–23)
CHLORIDE: 106 mmol/L (ref 98–111)
CO2: 25 mmol/L (ref 22–32)
Calcium: 9.4 mg/dL (ref 8.9–10.3)
Creatinine, Ser: 1.26 mg/dL — ABNORMAL HIGH (ref 0.61–1.24)
GFR calc Af Amer: >60 mL/min (ref >60)
GFR, EST NON AFRICAN AMERICAN: 54 mL/min — AB (ref >60)
Glucose, Bld: 114 mg/dL — ABNORMAL HIGH (ref 70–99)
POTASSIUM: 4.2 mmol/L (ref 3.5–5.1)
Sodium: 141 mmol/L (ref 135–145)

## 2017-11-05 LAB — CBG MONITORING, ED: Glucose-Capillary: 114 mg/dL — ABNORMAL HIGH (ref 70–99)

## 2017-11-05 LAB — TSH: TSH: 0.8 u[IU]/mL (ref 0.350–4.500)

## 2017-11-05 MED ORDER — DOCUSATE SODIUM 100 MG PO CAPS
100.0000 mg | ORAL_CAPSULE | Freq: Two times a day (BID) | ORAL | Status: DC
  Administered 2017-11-05 – 2017-11-06 (×2): 100 mg via ORAL
  Filled 2017-11-05 (×2): qty 1

## 2017-11-05 MED ORDER — ACETAMINOPHEN 650 MG RE SUPP: 650.0000 mg | Freq: Four times a day (QID) | RECTAL | Status: DC | PRN

## 2017-11-05 MED ORDER — OXYCODONE HCL 5 MG PO TABS
2.5000 mg | ORAL_TABLET | Freq: Once | ORAL | Status: AC
  Administered 2017-11-05: 2.5 mg via ORAL
  Filled 2017-11-05: qty 1

## 2017-11-05 MED ORDER — PANTOPRAZOLE SODIUM 40 MG PO TBEC
40.0000 mg | DELAYED_RELEASE_TABLET | Freq: Two times a day (BID) | ORAL | Status: DC
  Administered 2017-11-05 – 2017-11-06 (×2): 40 mg via ORAL
  Filled 2017-11-05 (×2): qty 1

## 2017-11-05 MED ORDER — ALLOPURINOL 300 MG PO TABS: 300.0000 mg | ORAL_TABLET | Freq: Every day | ORAL | Status: DC

## 2017-11-05 MED ORDER — ONDANSETRON HCL 4 MG/2ML IJ SOLN: 4.0000 mg | Freq: Four times a day (QID) | INTRAMUSCULAR | Status: DC | PRN

## 2017-11-05 MED ORDER — TETANUS-DIPHTH-ACELL PERTUSSIS 5-2.5-18.5 LF-MCG/0.5 IM SUSP
0.5000 mL | Freq: Once | INTRAMUSCULAR | Status: AC
  Administered 2017-11-05: 0.5 mL via INTRAMUSCULAR
  Filled 2017-11-05: qty 0.5

## 2017-11-05 MED ORDER — MORPHINE SULFATE (PF) 2 MG/ML IV SOLN
2.0000 mg | INTRAVENOUS | Status: DC | PRN
  Administered 2017-11-06: 2 mg via INTRAVENOUS
  Filled 2017-11-05: qty 1

## 2017-11-05 MED ORDER — SODIUM CHLORIDE 0.9 % IV BOLUS
1000.0000 mL | Freq: Once | INTRAVENOUS | Status: AC
  Administered 2017-11-05: 1000 mL via INTRAVENOUS

## 2017-11-05 MED ORDER — ONDANSETRON HCL 4 MG/2ML IJ SOLN
4.0000 mg | Freq: Once | INTRAMUSCULAR | Status: AC
  Administered 2017-11-05: 4 mg via INTRAVENOUS
  Filled 2017-11-05: qty 2

## 2017-11-05 MED ORDER — ONDANSETRON HCL 4 MG PO TABS: 4.0000 mg | ORAL_TABLET | Freq: Four times a day (QID) | ORAL | Status: DC | PRN

## 2017-11-05 MED ORDER — ACETAMINOPHEN 325 MG PO TABS
650.0000 mg | ORAL_TABLET | Freq: Four times a day (QID) | ORAL | Status: DC | PRN
  Administered 2017-11-05 (×2): 650 mg via ORAL
  Filled 2017-11-05 (×2): qty 2

## 2017-11-05 MED ORDER — IRBESARTAN 300 MG PO TABS
300.0000 mg | ORAL_TABLET | Freq: Every day | ORAL | Status: DC
  Administered 2017-11-06: 300 mg via ORAL
  Filled 2017-11-05: qty 1

## 2017-11-05 MED ORDER — AMLODIPINE BESYLATE 10 MG PO TABS
10.0000 mg | ORAL_TABLET | Freq: Every day | ORAL | Status: DC
  Administered 2017-11-06: 10 mg via ORAL
  Filled 2017-11-05: qty 1

## 2017-11-05 MED ORDER — AMLODIPINE BESYLATE-VALSARTAN 10-320 MG PO TABS: 1.0000 | ORAL_TABLET | Freq: Every day | ORAL | Status: DC

## 2017-11-05 MED ORDER — ACETAMINOPHEN 500 MG PO TABS
1000.0000 mg | ORAL_TABLET | Freq: Once | ORAL | Status: AC
  Administered 2017-11-05: 1000 mg via ORAL
  Filled 2017-11-05: qty 2

## 2017-11-05 MED ORDER — SODIUM CHLORIDE 0.9% FLUSH
3.0000 mL | Freq: Two times a day (BID) | INTRAVENOUS | Status: DC
  Administered 2017-11-05 – 2017-11-06 (×2): 3 mL via INTRAVENOUS

## 2017-11-05 MED ORDER — ENOXAPARIN SODIUM 40 MG/0.4ML ~~LOC~~ SOLN
40.0000 mg | SUBCUTANEOUS | Status: DC
  Administered 2017-11-05: 40 mg via SUBCUTANEOUS
  Filled 2017-11-05: qty 0.4

## 2017-11-05 MED ORDER — LACTATED RINGERS IV SOLN
INTRAVENOUS | Status: DC
  Administered 2017-11-05: 18:00:00 via INTRAVENOUS

## 2017-11-05 NOTE — ED Provider Notes (Signed)
[facility] EMERGENCY DEPARTMENT Provider Note   CSN: [id number] Arrival date & time: 11/05/17  4132     History   Chief Complaint Chief Complaint  Patient presents with  . Near Syncope    HPI Ricky Schank. is a 72 y.o. male.  72 yo M with a chief complaint of a near syncopal event.  The patient was walking his dog and suddenly felt like his vision was fading like he was going to pass out and he collapsed to the ground.  Struck the right side of his face.  Complaining of pain to the face the right shoulder the right hand and the right hip.  This is been an ongoing issue for him.  He has had recurrent episodes where he felt like he was going to pass out.  Has been evaluated by neurology as well as cardiology for the same.  He feels that over the past month it got significantly worse in the number of events that he had has increased to almost happening every other day.  He denies decreased oral intake denies dark stool or blood in stool denies nausea vomiting or diarrhea.  Denies fevers or chills.  Denies chest pain shortness of breath headache or neck pain prior to his syncopal event.  The history is provided by the patient and the spouse.  Near Syncope  This is a recurrent problem. The current episode started less than 1 hour ago. The problem occurs constantly. The problem has not changed since onset.Associated symptoms include headaches. Pertinent negatives include no chest pain, no abdominal pain and no shortness of breath. Nothing aggravates the symptoms. Nothing relieves the symptoms. He has tried nothing for the symptoms. The treatment provided no relief.    Past Medical History:  Diagnosis Date  . Borderline diabetes    DIET CONTROLLED  . Dyslipidemia   . Elevated PSA   . External carotid artery stenosis    MILD-- BILATERAL  . Fall 11/05/2017  . GERD (gastroesophageal reflux disease)   . Gout, arthritis   . Hearing loss in left ear   . Heart  murmur    MILD --  ASYMPTOMATIC  . History of gastric ulcer    REMOTE HX  . Hypertension   . Pre-diabetes   . Prostate cancer ([place]) 12/21/12   Gleason 3+4=7  . Wears glasses     Patient Active Problem List   Diagnosis Date Noted  . Syncope 11/05/2017  . Proteinuria 07/26/2013  . History of prostate cancer 05/02/2013  . Monoclonal paraproteinemia 03/14/2013    Past Surgical History:  Procedure Laterality Date  . KNEE ARTHROSCOPY W/ MENISCAL REPAIR Left JAN 2014  . [place]  . PROSTATE BIOPSY N/A 12/21/2012   Procedure: BIOPSY TRANSRECTAL ULTRASONIC PROSTATE (TUBP);  Surgeon: [name], MD;  Location: [facility];  Service: Urology;  Laterality: N/A;  . PROSTATE BIOPSY  11/26/11   benign        Home Medications    Prior to Admission medications   Medication Sig Start Date End Date Taking? Authorizing Provider  acetaminophen (TYLENOL) 500 MG tablet Take 500 mg by mouth every 8 (eight) hours.    Yes [provider]  allopurinol (ZYLOPRIM) 300 MG tablet Take 300 mg by mouth at bedtime.    Yes [provider]  amLODipine-valsartan (EXFORGE) 10-320 MG tablet Take 1 tablet by mouth daily.    Yes [provider]  colchicine 0.6 MG tablet Take  0.6 mg by mouth 2 (two) times daily as needed (for gout).    Yes [provider]  meclizine (ANTIVERT) 25 MG tablet Take 1 tablet (25 mg total) by mouth 3 (three) times daily as needed for dizziness. 09/08/15  Yes Harvel Quale, MD  Naphazoline HCl (CLEAR EYES OP) Place 1 drop into both eyes See admin instructions. Instill 1 drop into both eyes every morning, may also use 1 drop both eyes later in the day as needed for dry eyes/irritation   Yes [provider]  omeprazole (PRILOSEC) 20 MG capsule Take 20 mg by mouth 2 (two) times daily.  05/15/14  Yes [provider]  REPATHA SURECLICK 623 MG/ML SOAJ Inject 140 mg into the skin every 14 (fourteen) days.   06/07/17  Yes [provider]  tolnaftate (TINACTIN) 1 % cream Apply 1 application topically daily as needed (jock itch).   Yes [provider]  ondansetron (ZOFRAN) 4 MG tablet Take 1 tablet (4 mg total) by mouth every 8 (eight) hours as needed for nausea or vomiting. Patient not taking: Reported on 11/05/2017 10/17/15   Josue Hector, MD    Family History Family History  Problem Relation Age of Onset  . Heart disease Mother   . Heart disease Father   . Cancer Sister        lung  . Cancer Brother        prostate    Social History Social History   Tobacco Use  . Smoking status: Former Smoker    Packs/day: 1.00    Years: 30.00    Pack years: 30.00    Types: Cigarettes    Last attempt to quit: 12/15/1997    Years since quitting: 19.9  . Smokeless tobacco: Never Used  Substance Use Topics  . Alcohol use: Yes    Alcohol/week: 20.0 standard drinks    Types: 20 Shots of liquor per week    Comment: OCCASIONAL  . Drug use: No     Allergies   Atorvastatin and Statins   Review of Systems Review of Systems  Constitutional: Negative for chills and fever.  HENT: Negative for congestion and facial swelling.   Eyes: Negative for discharge and visual disturbance.  Respiratory: Negative for shortness of breath.   Cardiovascular: Positive for near-syncope. Negative for chest pain and palpitations.  Gastrointestinal: Negative for abdominal pain, diarrhea and vomiting.  Musculoskeletal: Positive for arthralgias and myalgias.  Skin: Negative for color change and rash.  Neurological: Positive for headaches. Negative for tremors and syncope.  Psychiatric/Behavioral: Negative for confusion and dysphoric mood.     Physical Exam Updated Vital Signs BP 131/75 (BP Location: Right Arm)   Pulse 67   Temp 98.8 F (37.1 C) (Oral)   Resp 16   SpO2 97%   Physical Exam  Constitutional: He is oriented to person, place, and time. He appears well-developed and  well-nourished.  HENT:  Head: Normocephalic.  Abrasion to the right frontal region with surrounding hematoma.    Eyes: Pupils are equal, round, and reactive to light. EOM are normal.  Neck: Normal range of motion. Neck supple. No JVD present.  Cardiovascular: Normal rate and regular rhythm. Exam reveals no gallop and no friction rub.  No murmur heard. Pulmonary/Chest: No respiratory distress. He has no wheezes.  Abdominal: He exhibits no distension and no mass. There is no tenderness. There is no rebound and no guarding.  Musculoskeletal: Normal range of motion.  He has an abrasion to the  right anterior shoulder.  Full range of motion of the shoulder.  No bony tenderness to palpation.  Palpated from head to toe without any tenderness with the exception of the right pubic ramus.  The pain seems more in the soft tissues where the abductor muscles attach.  He is having some pain with forward flexion as well.  Neurological: He is alert and oriented to person, place, and time. No cranial nerve deficit or sensory deficit. GCS eye subscore is 4. GCS verbal subscore is 5. GCS motor subscore is 6.  Weakness to the right lower extremity with flexion most likely secondary to pain.  Skin: No rash noted. No pallor.  Psychiatric: He has a normal mood and affect. His behavior is normal.  Nursing note and vitals reviewed.    ED Treatments / Results  Labs (all labs ordered are listed, but only abnormal results are displayed) Labs Reviewed  BASIC METABOLIC PANEL - Abnormal; Notable for the following components:      Result Value   Glucose, Bld 114 (*)    Creatinine, Ser 1.26 (*)    GFR calc non Af Amer 54 (*)    All other components within normal limits  CBC - Abnormal; Notable for the following components:   MCV 100.7 (*)    All other components within normal limits  URINALYSIS, ROUTINE W REFLEX MICROSCOPIC - Abnormal; Notable for the following components:   Protein, ur 100 (*)    All other  components within normal limits  CBG MONITORING, ED - Abnormal; Notable for the following components:   Glucose-Capillary 114 (*)    All other components within normal limits    EKG EKG Interpretation  Date/Time:  Thursday November 05 2017 10:10:26 EDT Ventricular Rate:  77 PR Interval:  152 QRS Duration: 86 QT Interval:  386 QTC Calculation: 436 R Axis:   -43 Text Interpretation:  Normal sinus rhythm Left axis deviation Minimal voltage criteria for LVH, may be normal variant Possible Anterior infarct , age undetermined Abnormal ECG no prolonged qt, brugada or wpw No significant change since last tracing Confirmed by Deno Etienne [phone]) on 11/05/2017 11:55:41 AM   Radiology Dg Shoulder Right  Result Date: 11/05/2017 CLINICAL DATA:  Acute RIGHT shoulder pain following fall. Initial encounter. EXAM: RIGHT SHOULDER - 2+ VIEW COMPARISON:  01/19/2017 and prior chest radiographs FINDINGS: No acute fracture, subluxation or dislocation. No focal bony lesions are present. Minimal glenohumeral joint space narrowing/osteophytosis noted. IMPRESSION: No acute abnormality. Electronically Signed   By: Margarette Canada M.D.   On: 11/05/2017 13:35   Ct Head Wo Contrast  Result Date: 11/05/2017 CLINICAL DATA:  Patient with dizziness status post fall. Scratch the patient with dizziness. Recent fall. EXAM: CT HEAD WITHOUT CONTRAST CT CERVICAL SPINE WITHOUT CONTRAST TECHNIQUE: Multidetector CT imaging of the head and cervical spine was performed following the standard protocol without intravenous contrast. Multiplanar CT image reconstructions of the cervical spine were also generated. COMPARISON:  MRI brain 02/03/2015 FINDINGS: CT HEAD FINDINGS Brain: Ventricles and sulci are prominent compatible with atrophy. Periventricular and subcortical white matter hypodensity compatible with chronic microvascular ischemic changes. No evidence for acute cortically based infarct, intracranial hemorrhage, mass lesion or mass-effect.  Vascular: Internal carotid arterial vascular calcifications. Skull: Intact. Sinuses/Orbits: Paranasal sinuses are well aerated. Mastoid air cells are unremarkable. Orbits are unremarkable. Other: Soft tissue swelling overlying the right frontal calvarium (image 13; series 4). CT CERVICAL SPINE FINDINGS Alignment: Straightening of the normal cervical lordosis. Skull base and vertebrae: Intact. Soft  tissues and spinal canal: No prevertebral fluid or swelling. No visible canal hematoma. Disc levels: Multilevel degenerative disc disease most pronounced C4-5 and C5-6. No evidence for acute fracture. Upper chest: Unremarkable. Other: None. IMPRESSION: 1. No acute intracranial process. Atrophy and chronic microvascular ischemic changes. 2. No acute cervical spine fracture. Electronically Signed   By: Lovey Newcomer M.D.   On: 11/05/2017 11:59   Ct Cervical Spine Wo Contrast  Result Date: 11/05/2017 CLINICAL DATA:  Patient with dizziness status post fall. Scratch the patient with dizziness. Recent fall. EXAM: CT HEAD WITHOUT CONTRAST CT CERVICAL SPINE WITHOUT CONTRAST TECHNIQUE: Multidetector CT imaging of the head and cervical spine was performed following the standard protocol without intravenous contrast. Multiplanar CT image reconstructions of the cervical spine were also generated. COMPARISON:  MRI brain 02/03/2015 FINDINGS: CT HEAD FINDINGS Brain: Ventricles and sulci are prominent compatible with atrophy. Periventricular and subcortical white matter hypodensity compatible with chronic microvascular ischemic changes. No evidence for acute cortically based infarct, intracranial hemorrhage, mass lesion or mass-effect. Vascular: Internal carotid arterial vascular calcifications. Skull: Intact. Sinuses/Orbits: Paranasal sinuses are well aerated. Mastoid air cells are unremarkable. Orbits are unremarkable. Other: Soft tissue swelling overlying the right frontal calvarium (image 13; series 4). CT CERVICAL SPINE FINDINGS  Alignment: Straightening of the normal cervical lordosis. Skull base and vertebrae: Intact. Soft tissues and spinal canal: No prevertebral fluid or swelling. No visible canal hematoma. Disc levels: Multilevel degenerative disc disease most pronounced C4-5 and C5-6. No evidence for acute fracture. Upper chest: Unremarkable. Other: None. IMPRESSION: 1. No acute intracranial process. Atrophy and chronic microvascular ischemic changes. 2. No acute cervical spine fracture. Electronically Signed   By: Lovey Newcomer M.D.   On: 11/05/2017 11:59   Dg Hand Complete Right  Result Date: 11/05/2017 CLINICAL DATA:  Acute RIGHT hand pain following fall. Initial encounter. EXAM: RIGHT HAND - COMPLETE 3+ VIEW COMPARISON:  None. FINDINGS: No acute fracture, subluxation or dislocation. No unexpected radiopaque foreign body noted. Mild degenerative changes at the 1st 2nd and 3rd MCP joints noted. No suspicious focal bony lesions noted. IMPRESSION: 1. No acute abnormality. Electronically Signed   By: Margarette Canada M.D.   On: 11/05/2017 13:38   Dg Hip Unilat W Or Wo Pelvis 2-3 Views Right  Result Date: 11/05/2017 CLINICAL DATA:  Near syncopal episode.  Fall.  Pain right hip. EXAM: DG HIP (WITH OR WITHOUT PELVIS) 2-3V RIGHT COMPARISON:  No prior. FINDINGS: Degenerative changes lumbar spine and both hips. No acute bony abnormality identified. No evidence of fracture or dislocation. Surgical clips in the pelvis. IMPRESSION: Degenerative changes lumbar spine and both hips. No acute bony abnormality. Aortoiliac atherosclerotic vascular disease. Electronically Signed   By: Marcello Moores  Register   On: 11/05/2017 13:35    Procedures Procedures (including critical care time)  Medications Ordered in ED Medications  acetaminophen (TYLENOL) tablet 1,000 mg (1,000 mg Oral Given 11/05/17 1339)  oxyCODONE (Oxy IR/ROXICODONE) immediate release tablet 2.5 mg (2.5 mg Oral Given 11/05/17 1339)  sodium chloride 0.9 % bolus 1,000 mL (0 mLs Intravenous  Stopped 11/05/17 1448)  Tdap (BOOSTRIX) injection 0.5 mL (0.5 mLs Intramuscular Given 11/05/17 1339)  ondansetron (ZOFRAN) injection 4 mg (4 mg Intravenous Given 11/05/17 1450)     Initial Impression / Assessment and Plan / ED Course  I have reviewed the triage vital signs and the nursing notes.  Pertinent labs & imaging results that were available during my care of the patient were reviewed by me and considered in my medical  decision making (see chart for details).     72 yo M with a cc of near syncope.  This is been an off-and-on issue for him.  This event sounds concerning for exertional syncope is he was actively walking the dog and then became lightheaded and claps to the ground with minimal protection of his face.  He has never had an echocardiogram.  His labs are otherwise reassuring he has a normal hemoglobin is kidney function is at baseline.  CT of the head was negative for acute intracranial hemorrhage is viewed by me.  Patient was complaining of pain to the right hip right hand and right shoulder and so plain films were obtained and are negative.  I will discuss with the hospitalist for observation on telemetry with possible echo.  The patients results and plan were reviewed and discussed.   Any x-rays performed were independently reviewed by myself.   Differential diagnosis were considered with the presenting HPI.  Medications  acetaminophen (TYLENOL) tablet 1,000 mg (1,000 mg Oral Given 11/05/17 1339)  oxyCODONE (Oxy IR/ROXICODONE) immediate release tablet 2.5 mg (2.5 mg Oral Given 11/05/17 1339)  sodium chloride 0.9 % bolus 1,000 mL (0 mLs Intravenous Stopped 11/05/17 1448)  Tdap (BOOSTRIX) injection 0.5 mL (0.5 mLs Intramuscular Given 11/05/17 1339)  ondansetron (ZOFRAN) injection 4 mg (4 mg Intravenous Given 11/05/17 1450)    Vitals:   11/05/17 1415 11/05/17 1430 11/05/17 1445 11/05/17 1600  BP: (!) 148/62 (!) 178/82 (!) 156/79 131/75  Pulse: 74 76 79 67  Resp: 16 18 16 16     Temp:    98.8 F (37.1 C)  TempSrc:    Oral  SpO2: 97% 99% 98% 97%    Final diagnoses:  Syncope and collapse    Admission/ observation were discussed with the admitting physician, patient and/or family and they are comfortable with the plan.    Final Clinical Impressions(s) / ED Diagnoses   Final diagnoses:  Syncope and collapse    ED Discharge Orders    None       Deno Etienne, DO 11/05/17 1603

## 2017-11-05 NOTE — Consult Note (Signed)
Hospital Consult    Reason for Consult: Dizziness in the setting of moderate left ICA stenosis Referring Physician: Dr. Lorin Mercy MRN #:  [id number]  History of Present Illness: This is a 72 y.o. male with history of hypertension, diet-controlled diabetes and carotid stenosis that was admitted with dizziness and near syncope that vascular surgery was called to evaluate his carotid disease.  Patient reports that he has been having these episodes over the last 4 years.  He describes episode as acute onset dizziness and lightheadedness with subsequent sweating and nausea.  He states over the last month it has been happening more frequently.  No associated activity with onset.  Earlier today he fell and landed on the concrete after an episode while he was walking.  He denies any focal visual changes.  He denies any slurred speech.  He denies any unilateral symptoms like weakness or numbness in arm or leg.  He was recently seen by Dr. Trula Slade for a moderate left carotid stenosis that was 50 to 69% whereas the contralateral side was less than 50% (he also had antegrade flow in both vertebral arteries).  He was recommended for interval six-month follow-up given his asymptomatic carotid disease.  Past Medical History:  Diagnosis Date  . Borderline diabetes    DIET CONTROLLED  . Dyslipidemia   . Elevated PSA   . External carotid artery stenosis    MILD-- BILATERAL  . Fall 11/05/2017  . GERD (gastroesophageal reflux disease)   . Gout, arthritis   . Hearing loss in left ear   . Heart murmur    MILD --  ASYMPTOMATIC  . History of gastric ulcer    REMOTE HX  . Hypertension   . Pre-diabetes   . Prostate cancer ([place]) 12/21/12   Gleason 3+4=7  . Wears glasses     Past Surgical History:  Procedure Laterality Date  . KNEE ARTHROSCOPY W/ MENISCAL REPAIR Left JAN 2014  . [place]  . PROSTATE BIOPSY N/A 12/21/2012   Procedure: BIOPSY TRANSRECTAL ULTRASONIC PROSTATE (TUBP);  Surgeon:  [name], MD;  Location[facility];  Service: Urology;  Laterality: N/A;  . PROSTATE BIOPSY  11/26/11   benign    Allergies  Allergen Reactions  . Atorvastatin Other (See Comments)    Joint pain   . Statins Other (See Comments)    Joint pain    Prior to Admission medications   Medication Sig Start Date End Date Taking? Authorizing Provider  acetaminophen (TYLENOL) 500 MG tablet Take 500 mg by mouth every 8 (eight) hours.    Yes [provider]  allopurinol (ZYLOPRIM) 300 MG tablet Take 300 mg by mouth at bedtime.    Yes [provider]  amLODipine-valsartan (EXFORGE) 10-320 MG tablet Take 1 tablet by mouth daily.    Yes [provider]  colchicine 0.6 MG tablet Take 0.6 mg by mouth 2 (two) times daily as needed (for gout).    Yes [provider]  meclizine (ANTIVERT) 25 MG tablet Take 1 tablet (25 mg total) by mouth 3 (three) times daily as needed for dizziness. 09/08/15  Yes Harvel Quale, MD  Naphazoline HCl (CLEAR EYES OP) Place 1 drop into both eyes See admin instructions. Instill 1 drop into both eyes every morning, may also use 1 drop both eyes later in the day as needed for dry eyes/irritation   Yes [provider]  omeprazole (PRILOSEC) 20 MG capsule Take 20 mg by mouth 2 (two) times  daily.  05/15/14  Yes [provider]  REPATHA SURECLICK 737 MG/ML SOAJ Inject 140 mg into the skin every 14 (fourteen) days.  06/07/17  Yes [provider]  tolnaftate (TINACTIN) 1 % cream Apply 1 application topically daily as needed (jock itch).   Yes [provider]  ondansetron (ZOFRAN) 4 MG tablet Take 1 tablet (4 mg total) by mouth every 8 (eight) hours as needed for nausea or vomiting. Patient not taking: Reported on 11/05/2017 10/17/15   Josue Hector, MD    Social History   Socioeconomic History  . Marital status: Married    Spouse name: geraldine  . Number of children: 3  . Years of education:  some colle  . Highest education level: Not on file  Occupational History  . Occupation: retired  Scientific laboratory technician  . Financial resource strain: Not on file  . Food insecurity:    Worry: Not on file    Inability: Not on file  . Transportation needs:    Medical: Not on file    Non-medical: Not on file  Tobacco Use  . Smoking status: Former Smoker    Packs/day: 1.00    Years: 30.00    Pack years: 30.00    Types: Cigarettes    Last attempt to quit: 12/15/1997    Years since quitting: 19.9  . Smokeless tobacco: Never Used  Substance and Sexual Activity  . Alcohol use: Yes    Alcohol/week: 20.0 standard drinks    Types: 20 Shots of liquor per week    Comment: OCCASIONAL  . Drug use: No  . Sexual activity: Not on file  Lifestyle  . Physical activity:    Days per week: Not on file    Minutes per session: Not on file  . Stress: Not on file  Relationships  . Social connections:    Talks on phone: Not on file    Gets together: Not on file    Attends religious service: Not on file    Active member of club or organization: Not on file    Attends meetings of clubs or organizations: Not on file    Relationship status: Not on file  . Intimate partner violence:    Fear of current or ex partner: Not on file    Emotionally abused: Not on file    Physically abused: Not on file    Forced sexual activity: Not on file  Other Topics Concern  . Not on file  Social History Narrative   Lives with wife at home    caffeine use- occas, mostly decaf coffee     Family History  Problem Relation Age of Onset  . Heart disease Mother   . Heart disease Father   . Cancer Sister        lung  . Cancer Brother        prostate    ROS: [x]  Positive   [ ]  Negative   [ ]  All sytems reviewed and are negative  Cardiovascular: []  chest pain/pressure []  palpitations []  SOB lying flat []  DOE []  pain in legs while walking []  pain in legs at rest []  pain in legs at night []  non-healing ulcers []  hx  of DVT []  swelling in legs  Pulmonary: []  productive cough []  asthma/wheezing []  home O2  Neurologic: []  weakness in []  arms []  legs []  numbness in []  arms []  legs []  hx of CVA []  mini stroke [] difficulty speaking or slurred speech []  temporary loss of vision  in one eye [x]  dizziness  Hematologic: []  hx of cancer []  bleeding problems []  problems with blood clotting easily  Endocrine:   []  diabetes []  thyroid disease  GI []  vomiting blood []  blood in stool  GU: []  CKD/renal failure []  HD--[]  M/W/F or []  T/T/S []  burning with urination []  blood in urine  Psychiatric: []  anxiety []  depression  Musculoskeletal: []  arthritis []  joint pain  Integumentary: []  rashes []  ulcers  Constitutional: []  fever []  chills   Physical Examination  Vitals:   11/05/17 1445 11/05/17 1600  BP: (!) 156/79 131/75  Pulse: 79 67  Resp: 16 16  Temp:  98.8 F (37.1 C)  SpO2: 98% 97%   There is no height or weight on file to calculate BMI.  General:  NAD HENT: WNL, normocephalic Pulmonary: normal non-labored breathing, without Rales, rhonchi,  wheezing Cardiac: regular, without  Murmurs, rubs or gallops; left carotid bruit Abdomen:  soft, NT/ND, no masses Skin: Right sided facial and shoulder abrasions Vascular Exam/Pulses:  Right Left  Radial 2+ (normal) 2+ (normal)  Ulnar    Femoral 2+ (normal) 2+ (normal)  Popliteal 2+ (normal) 2+ (normal)  DP 2+ (normal) 2+ (normal)  PT 2+ (normal) 2+ (normal)   Extremities: without ischemic changes, without Gangrene , without cellulitis; without open wounds;  Musculoskeletal: no muscle wasting or atrophy  Neurologic: A&O X 3; Appropriate Affect ; SENSATION: normal; MOTOR FUNCTION:  moving all extremities equally. Speech is fluent/normal.  CN II-XII grossly intact. Psychiatric:  Pleasant.  Calm.  CBC    Component Value Date/Time   WBC 7.2 11/05/2017 1019   RBC 4.48 11/05/2017 1019   HGB 14.6 11/05/2017 1019   HGB 15.6  06/18/2016 0857   HCT 45.1 11/05/2017 1019   HCT 46.0 06/18/2016 0857   PLT 159 11/05/2017 1019   PLT 180 06/18/2016 0857   MCV 100.7 (H) 11/05/2017 1019   MCV 95.3 06/18/2016 0857   MCH 32.6 11/05/2017 1019   MCHC 32.4 11/05/2017 1019   RDW 13.2 11/05/2017 1019   RDW 14.5 06/18/2016 0857   LYMPHSABS 1.5 07/09/2017 1008   LYMPHSABS 1.7 06/18/2016 0857   MONOABS 0.3 07/09/2017 1008   MONOABS 0.4 06/18/2016 0857   EOSABS 0.2 07/09/2017 1008   EOSABS 0.2 06/18/2016 0857   BASOSABS 0.0 07/09/2017 1008   BASOSABS 0.1 06/18/2016 0857    BMET    Component Value Date/Time   NA 141 11/05/2017 1019   NA 140 06/18/2016 0857   K 4.2 11/05/2017 1019   K 4.2 06/18/2016 0857   CL 106 11/05/2017 1019   CO2 25 11/05/2017 1019   CO2 25 06/18/2016 0857   GLUCOSE 114 (H) 11/05/2017 1019   GLUCOSE 126 06/18/2016 0857   BUN 18 11/05/2017 1019   BUN 14.7 06/18/2016 0857   CREATININE 1.26 (H) 11/05/2017 1019   CREATININE 1.1 06/18/2016 0857   CALCIUM 9.4 11/05/2017 1019   CALCIUM 10.1 06/18/2016 0857   GFRNONAA 54 (L) 11/05/2017 1019   GFRAA >60 11/05/2017 1019    COAGS: Lab Results  Component Value Date   INR 0.88 04/13/2013     Non-Invasive Vascular Imaging:    Reviewed his previous carotid duplex from 07/21/2017: Right ICA velocity 102/14 consistent with less than 50% stenosis.  Left ICA velocity 152/28 consistent with 50 to 69% stenosis.  Antegrade flow in both vertebral arteries with normal waveforms.   ASSESSMENT/PLAN: This is a 72 y.o. male admitted for dizziness and presyncope that vascular surgery has been  asked to reevaluate him given a history of moderate left carotid stenosis.  I discussed with the patient and his family that we do not typically associate carotid disease with dizziness particularly in his situation given antegrade flow in both vertebral arteries with normal waveforms and no significant stenosis in the right carotid artery.  He has had no localizing  symptoms or other acute presentation to suggest TIA or stroke..  Agree with continued medical work-up including echocardiogram and EEG after discussing with Dr. Lorin Mercy.  Vascular surgery will be happy to follow along while here in the hospital.  Family has asked to repeat his duplex that was done in May 2019.  I discussed that it likely has not changed significantly.  Would recommend continue medical management for his carotid disease including aspirin and apparently cannot tolerate statins.  Assuming no changes on his duplex would likely plan for scheduled six-month follow-up with Dr. Trula Slade.   Marty Heck, MD [facility] Office: [phone] Pager: Goodnight

## 2017-11-05 NOTE — ED Notes (Signed)
Patient transported to X-ray 
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
## 2017-11-05 NOTE — ED Triage Notes (Signed)
Pt in after near syncopal episode that started while walking his dog, states he had a sudden onset of dizziness and could tell he was going to fall, pt hit his head on concrete, states he has had multiple episodes like this in the past, increased frequency in the last week, alert and oriented, c/o generalized weakness at this time, no focal deficits, pt has abrasion to right forehead

## 2017-11-06 ENCOUNTER — Observation Stay (HOSPITAL_BASED_OUTPATIENT_CLINIC_OR_DEPARTMENT_OTHER): Payer: Medicare Other

## 2017-11-06 DIAGNOSIS — I6522 Occlusion and stenosis of left carotid artery: Secondary | ICD-10-CM

## 2017-11-06 DIAGNOSIS — I361 Nonrheumatic tricuspid (valve) insufficiency: Secondary | ICD-10-CM

## 2017-11-06 DIAGNOSIS — R55 Syncope and collapse: Secondary | ICD-10-CM | POA: Diagnosis not present

## 2017-11-06 DIAGNOSIS — R42 Dizziness and giddiness: Secondary | ICD-10-CM | POA: Diagnosis not present

## 2017-11-06 DIAGNOSIS — I10 Essential (primary) hypertension: Secondary | ICD-10-CM

## 2017-11-06 LAB — CBC
HEMATOCRIT: 43.1 % (ref 39.0–52.0)
HEMOGLOBIN: 14.3 g/dL (ref 13.0–17.0)
MCH: 32.7 pg (ref 26.0–34.0)
MCHC: 33.2 g/dL (ref 30.0–36.0)
MCV: 98.6 fL (ref 78.0–100.0)
Platelets: 151 10*3/uL (ref 150–400)
RBC: 4.37 MIL/uL (ref 4.22–5.81)
RDW: 12.9 % (ref 11.5–15.5)
WBC: 8.9 10*3/uL (ref 4.0–10.5)

## 2017-11-06 LAB — BASIC METABOLIC PANEL
ANION GAP: 8 (ref 5–15)
BUN: 15 mg/dL (ref 8–23)
CALCIUM: 9.3 mg/dL (ref 8.9–10.3)
CO2: 27 mmol/L (ref 22–32)
Chloride: 104 mmol/L (ref 98–111)
Creatinine, Ser: 1.2 mg/dL (ref 0.61–1.24)
GFR, EST NON AFRICAN AMERICAN: 58 mL/min — AB (ref >60)
GLUCOSE: 112 mg/dL — AB (ref 70–99)
POTASSIUM: 3.9 mmol/L (ref 3.5–5.1)
Sodium: 139 mmol/L (ref 135–145)

## 2017-11-06 LAB — ECHOCARDIOGRAM COMPLETE
HEIGHTINCHES: 75 in
WEIGHTICAEL: 3347.46 [oz_av]

## 2017-11-06 LAB — LIPID PANEL
CHOL/HDL RATIO: 2.5 ratio
CHOLESTEROL: 168 mg/dL (ref 0–200)
HDL: 68 mg/dL (ref >40)
LDL Cholesterol: 67 mg/dL (ref 0–99)
TRIGLYCERIDES: 166 mg/dL — AB (ref <150)
VLDL: 33 mg/dL (ref 0–40)

## 2017-11-06 LAB — HEMOGLOBIN A1C
HEMOGLOBIN A1C: 5 % (ref 4.8–5.6)
MEAN PLASMA GLUCOSE: 96.8 mg/dL

## 2017-11-06 MED ORDER — KETOROLAC TROMETHAMINE 10 MG PO TABS
10.0000 mg | ORAL_TABLET | Freq: Three times a day (TID) | ORAL | Status: DC | PRN
  Administered 2017-11-06: 10 mg via ORAL
  Filled 2017-11-06 (×2): qty 1

## 2017-11-06 MED ORDER — TRAMADOL HCL 50 MG PO TABS: 50.0000 mg | ORAL_TABLET | Freq: Four times a day (QID) | ORAL | 0 refills | Status: AC | PRN

## 2017-11-06 MED ORDER — TRAMADOL HCL 50 MG PO TABS: 50.0000 mg | ORAL_TABLET | Freq: Four times a day (QID) | ORAL | Status: DC | PRN

## 2017-11-06 NOTE — Progress Notes (Signed)
PT Cancellation Note  Patient Details Name: Ricky AHART Sr. MRN: [id number] DOB: November 16, 1945   Cancelled Treatment:    Reason Eval/Treat Not Completed: Patient at procedure or test/unavailable. Leaving for echo/vascular.  Ellamae Sia, PT, DPT Acute Rehabilitation Services  Pager: [place] 11/06/2017, 9:43 AM

## 2017-11-06 NOTE — Progress Notes (Signed)
Carotid duplex prelim: right 1-39% ICA stenosis, left 40-59% ICA stenosis. Landry Mellow, RDMS, RVT
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
## 2017-11-06 NOTE — Evaluation (Signed)
Physical Therapy Evaluation Patient Details Name: Ricky MOKRY Sr. MRN: [id number] DOB: 07-18-1945 Today's Date: 11/06/2017   History of Present Illness  Ricky Rowland is a 72 yo M admitted following a fall from syncopal episode. Significant PMH includes HTN, diet controlled DM, and syncopal episodes for the past 4 years. These episodes have often resulted in falls at home and in the community. Carotid duplex shows right 1-39% ICA stenosis, left 40-59% ICA stenosis    Clinical Impression  Patient admitted with recurring falls with syncopal episodes. Describes symptoms as "lightheadedness," and is associated with vasovagal response of immediately sweating all over and nausea. He states symptoms last for several hours after initiation. On PT evaluation, patient is independent with all functional mobility, including ambulating hallway distances. Able to perform high level balance tests independently with no balance deficits noted. No symptoms reproduced upon examination and no nystagmus noted. However, patient did experience orthostatic hypotension upon transition to sitting; no subjective report of dizziness. Discussed with patient rest and restricting screen time to allow brain to recover following head impact from fall; pt verbalized understanding. No further acute PT needs at this time since patient is independent with mobility. PT signing off.  Orthostatic Vitals: Supine: 157/78 Sitting: 136/76 Standing: 146/70    Follow Up Recommendations No PT follow up    Equipment Recommendations  None recommended by PT    Recommendations for Other Services       Precautions / Restrictions Precautions Precautions: Fall Restrictions Weight Bearing Restrictions: No      Mobility  Bed Mobility Overal bed mobility: Independent                Transfers Overall transfer level: Independent Equipment used: None             General transfer comment: Discussed potential for using  cane or other AD to lessen injury with falls   Ambulation/Gait Ambulation/Gait assistance: Independent Gait Distance (Feet): 400 Feet Assistive device: None Gait Pattern/deviations: WFL(Within Functional Limits)   Gait velocity interpretation: >2.62 ft/sec, indicative of community ambulatory General Gait Details: Patient with good gait speed and upright posture. able to step over obstacles, perform head turns, stops/starts, varying gait speeds, and turns independently  Stairs            Wheelchair Mobility    Modified Rankin (Stroke Patients Only)       Balance Overall balance assessment: Mild deficits observed, not formally tested                                           Pertinent Vitals/Pain Pain Assessment: No/denies pain    Home Living Family/patient expects to be discharged to:: Private residence Living Arrangements: Spouse/significant other Available Help at Discharge: Family Type of Home: House Home Access: Stairs to enter     Home Layout: Two level;Bed/bath upstairs Home Equipment: Environmental consultant - 2 wheels;Shower seat      Prior Function Level of Independence: Independent         Comments: No longer working, driving occasionally. Stated that Dr. has alluded to restricting driving     Hand Dominance   Dominant Hand: Right    Extremity/Trunk Assessment   Upper Extremity Assessment Upper Extremity Assessment: Overall WFL for tasks assessed    Lower Extremity Assessment Lower Extremity Assessment: Overall WFL for tasks assessed    Cervical / Trunk Assessment Cervical / Trunk  Assessment: Normal  Communication   Communication: No difficulties  Cognition Arousal/Alertness: Awake/alert Behavior During Therapy: WFL for tasks assessed/performed Overall Cognitive Status: Within Functional Limits for tasks assessed                                        General Comments General comments (skin integrity, edema,  etc.): pt son present    Exercises     Assessment/Plan    PT Assessment Patent does not need any further PT services  PT Problem List         PT Treatment Interventions      PT Goals (Current goals can be found in the Care Plan section)  Acute Rehab PT Goals Patient Stated Goal: "to figure out why this keeps happening" PT Goal Formulation: All assessment and education complete, DC therapy    Frequency     Barriers to discharge        Co-evaluation               AM-PAC PT "6 Clicks" Daily Activity  Outcome Measure Difficulty turning over in bed (including adjusting bedclothes, sheets and blankets)?: None Difficulty moving from lying on back to sitting on the side of the bed? : None Difficulty sitting down on and standing up from a chair with arms (e.g., wheelchair, bedside commode, etc,.)?: None Help needed moving to and from a bed to chair (including a wheelchair)?: None Help needed walking in hospital room?: None Help needed climbing 3-5 steps with a railing? : None 6 Click Score: 24    End of Session Equipment Utilized During Treatment: Gait belt Activity Tolerance: Patient tolerated treatment well Patient left: in bed;with call bell/phone within reach;with family/visitor present   PT Visit Diagnosis: Dizziness and giddiness (R42);History of falling (Z91.81)    Time: 4742-5956 PT Time Calculation (min) (ACUTE ONLY): 24 min   Charges:   PT Evaluation $PT Eval Moderate Complexity: 1 Mod PT Treatments $Therapeutic Activity: 8-22 mins       Ellamae Sia, PT, DPT Acute Rehabilitation Services  Pager: [phone]   Willy Eddy 11/06/2017, 12:32 PM

## 2017-11-06 NOTE — Evaluation (Signed)
Occupational Therapy Evaluation Patient Details Name: Ricky Rowland Sr. MRN: [id number] DOB: 03/31/1945 Today's Date: 11/06/2017    History of Present Illness Clennon Nasca is a 72 yo M admitted following a fall from syncopal episode. Significant PMH includes HTN, diet controlled DM, and syncopal episodes for the past 4 years. These episodes have often resulted in falls at home and in the community.    Clinical Impression   Pt admitted s/p fall with syncopal episodes. Extensive edu and problem solving with pt re fall recovery techniques, lessening injury with these falls, and the use of AD. All education completed and no further OT needs at this time d/t pt at baseline. OT will sign off.     Follow Up Recommendations  No OT follow up    Equipment Recommendations  None recommended by OT    Recommendations for Other Services PT consult     Precautions / Restrictions Precautions Precautions: Fall Restrictions Weight Bearing Restrictions: No      Mobility Bed Mobility Overal bed mobility: Modified Independent                Transfers Overall transfer level: Modified independent Equipment used: None             General transfer comment: Discussed potential for using cane or other AD to lessen injury with falls     Balance Overall balance assessment: Mild deficits observed, not formally tested                           ADL either performed or assessed with clinical judgement   ADL Overall ADL's : At baseline                                Vision Baseline Vision/History: No visual deficits Patient Visual Report: No change from baseline Vision Assessment?: No apparent visual deficits            Pertinent Vitals/Pain Pain Assessment: No/denies pain     Hand Dominance Right   Extremity/Trunk Assessment Upper Extremity Assessment Upper Extremity Assessment: Overall WFL for tasks assessed   Lower Extremity Assessment Lower  Extremity Assessment: Defer to PT evaluation   Cervical / Trunk Assessment Cervical / Trunk Assessment: Kyphotic   Communication Communication Communication: No difficulties   Cognition Arousal/Alertness: Awake/alert Behavior During Therapy: WFL for tasks assessed/performed Overall Cognitive Status: Within Functional Limits for tasks assessed                                 Home Living Family/patient expects to be discharged to:: Private residence Living Arrangements: Spouse/significant other Available Help at Discharge: Family Type of Home: House Home Access: Stairs to enter     Home Layout: Two level;Bed/bath upstairs Alternate Level Stairs-Number of Steps: 12   Bathroom Shower/Tub: Teacher, early years/pre: Standard     Home Equipment: Environmental consultant - 2 wheels;Shower seat          Prior Functioning/Environment Level of Independence: Independent        Comments: No longer working, driving occasionally. Stated that Dr. has alluded to restricting driving        OT Problem List: Impaired balance (sitting and/or standing)         OT Goals(Current goals can be found in the care plan section) Acute Rehab OT Goals Patient  Stated Goal: "to figure out why this keeps happening" OT Goal Formulation: All assessment and education complete, DC therapy      AM-PAC PT "6 Clicks" Daily Activity     Outcome Measure Help from another person eating meals?: None Help from another person taking care of personal grooming?: None Help from another person toileting, which includes using toliet, bedpan, or urinal?: None Help from another person bathing (including washing, rinsing, drying)?: None Help from another person to put on and taking off regular upper body clothing?: None Help from another person to put on and taking off regular lower body clothing?: None 6 Click Score: 24   End of Session Equipment Utilized During Treatment: Gait belt Nurse Communication:  Mobility status  Activity Tolerance: Patient tolerated treatment well Patient left: in bed;with call bell/phone within reach  OT Visit Diagnosis: Unsteadiness on feet (R26.81)                Time: 5038-8828 OT Time Calculation (min): 13 min Charges:  OT General Charges $OT Visit: 1 Visit OT Evaluation $OT Eval Low Complexity: 1 Low  Curtis Sites OTR/L 11/06/2017, 9:25 AM

## 2017-11-06 NOTE — Discharge Instructions (Signed)
Follow up with PCP regarding OP referral for 30 day event monitor Discuss with Dr. Delfina Redwood about a tilt table test for orthostatic hypotension Wear TED hose (compression stockings) to see if symptoms improve   Near-Syncope Near-syncope is when you suddenly get weak or dizzy, or you feel like you might pass out (faint). During an episode of near-syncope, you may:  Feel dizzy or light-headed.  Feel sick to your stomach (nauseous).  See all white or all black.  Have cold, clammy skin.  If you passed out, get help right away.Call your local emergency services (911 in the [place] Do not drive yourself to the hospital. Follow these instructions at home: Pay attention to any changes in your symptoms. Take these actions to help with your condition:  Have someone stay with you until you feel stable.  Do not drive, use machinery, or play sports until your doctor says it is okay.  Keep all follow-up visits as told by your doctor. This is important.  If you start to feel like you might pass out, lie down right away and raise (elevate) your feet above the level of your heart. Breathe deeply and steadily. Wait until all of the symptoms are gone.  Drink enough fluid to keep your pee (urine) clear or pale yellow.  If you are taking blood pressure or heart medicine, get up slowly and spend many minutes getting ready to sit and then stand. This can help with dizziness.  Take over-the-counter and prescription medicines only as told by your doctor.  Get help right away if:  You have a very bad headache.  You have unusual pain in your chest, tummy, or back.  You are bleeding from your mouth or rectum.  You have black or tarry poop (stool).  You have a very fast or uneven heartbeat (palpitations).  You pass out one time or more than once.  You have jerky movements that you cannot control (seizure).  You are confused.  You have trouble walking.  You are very weak.  You have vision  problems. These symptoms may be an emergency. Do not wait to see if the symptoms will go away. Get medical help right away. Call your local emergency services (911 in the [place] Do not drive yourself to the hospital. This information is not intended to replace advice given to you by your health care provider. Make sure you discuss any questions you have with your health care provider. Document Released: 08/13/2007 Document Revised: 08/02/2015 Document Reviewed: 11/08/2014 Elsevier Interactive Patient Education  2017 [employer].

## 2017-11-06 NOTE — Progress Notes (Signed)
Echocardiogram 2D Echocardiogram has been performed.  Ricky Rowland 11/06/2017, 9:54 AM

## 2017-11-11 ENCOUNTER — Encounter: Payer: Self-pay | Admitting: Cardiology

## 2017-11-11 ENCOUNTER — Ambulatory Visit: Payer: Medicare Other | Admitting: Cardiology

## 2017-11-11 VITALS — BP 120/60 | HR 65 | Ht 75.0 in | Wt 204.8 lb

## 2017-11-11 DIAGNOSIS — I10 Essential (primary) hypertension: Secondary | ICD-10-CM | POA: Diagnosis not present

## 2017-11-11 DIAGNOSIS — R55 Syncope and collapse: Secondary | ICD-10-CM | POA: Diagnosis not present

## 2017-11-11 DIAGNOSIS — I6522 Occlusion and stenosis of left carotid artery: Secondary | ICD-10-CM

## 2017-11-11 DIAGNOSIS — R42 Dizziness and giddiness: Secondary | ICD-10-CM
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
## 2017-11-11 NOTE — Patient Instructions (Addendum)
Medication Instructions:  Your physician recommends that you continue on your current medications as directed. Please refer to the Current Medication list given to you today.   Labwork: None ordered  Testing/Procedures: Your physician has recommended that you wear a 30- DAY holter monitor. Holter monitors are medical devices that record the heart's electrical activity. Doctors most often use these monitors to diagnose arrhythmias. Arrhythmias are problems with the speed or rhythm of the heartbeat. The monitor is a small, portable device. You can wear one while you do your normal daily activities. This is usually used to diagnose what is causing palpitations/syncope (passing out).    Follow-Up: Your physician wants you to follow-up in: [place]. Johnsie Cancel.   Any Other Special Instructions Will Be Listed Below (If Applicable).     If you need a refill on your cardiac medications before your next appointment, please call your pharmacy.

## 2017-11-13 ENCOUNTER — Ambulatory Visit (INDEPENDENT_AMBULATORY_CARE_PROVIDER_SITE_OTHER): Payer: Medicare Other

## 2017-11-13 DIAGNOSIS — R42 Dizziness and giddiness: Secondary | ICD-10-CM

## 2017-11-13 DIAGNOSIS — R55 Syncope and collapse: Secondary | ICD-10-CM | POA: Diagnosis not present

## 2017-12-16 ENCOUNTER — Telehealth: Payer: Self-pay

## 2017-12-16 DIAGNOSIS — R42 Dizziness and giddiness: Secondary | ICD-10-CM

## 2017-12-16 DIAGNOSIS — R55 Syncope and collapse: Principal | ICD-10-CM

## 2017-12-16 NOTE — Telephone Encounter (Signed)
-----   Message from Josue Hector, MD sent at 12/15/2017  5:46 PM EDT ----- Normal monitor have him f/u with EP for dysautonomia, postural and pre syncope

## 2017-12-16 NOTE — Telephone Encounter (Signed)
Patient aware of monitor results. Per Dr. Johnsie Cancel, Normal monitor have him f/u with EP for dysautonomia, postural and pre syncope. Will send message to scheduler to help with an appointment with EP.  Patient verbalized understanding.

## 2017-12-24 ENCOUNTER — Encounter: Payer: Self-pay | Admitting: Internal Medicine

## 2017-12-24 ENCOUNTER — Ambulatory Visit: Payer: Medicare Other | Admitting: Internal Medicine

## 2017-12-24 VITALS — BP 142/77 | HR 57 | Ht 75.0 in | Wt 205.4 lb

## 2017-12-24 DIAGNOSIS — R55 Syncope and collapse: Secondary | ICD-10-CM

## 2017-12-24 DIAGNOSIS — R42 Dizziness and giddiness: Secondary | ICD-10-CM | POA: Diagnosis not present

## 2017-12-24 NOTE — Patient Instructions (Addendum)
Medication Instructions:  Your physician recommends that you continue on your current medications as directed. Please refer to the Current Medication list given to you today.  Labwork: None ordered.  Testing/Procedures: Your physician has recommended you have a Linq monitor implanted.     Follow-Up: Your physician recommends that you schedule a follow-up appointment in:   You will need a wound check with our device clinic 10-14 days after 10/29.  91 days after 10/29 with Dr Caryl Comes  Any Other Special Instructions Will Be Listed Below (If Applicable).  Please arrive at the [facility] main entrance of [facility] hospital at: 6:30am. Proceed to patient registration.  Bring your insurance information when you check in.  You may have a normal breakfast and take your normal medications the morning of your procedure.  You may drive yourself home after your procedure.  If you need a refill on your cardiac medications before your next appointment, please call your pharmacy.

## 2017-12-24 NOTE — H&P (View-Only) (Signed)
ELECTROPHYSIOLOGY CONSULT NOTE  Patient ID: Ricky Rowland., MRN: [id number], DOB/AGE: March 15, 1945 72 y.o. Admit date: (Not on file) Date of Consult: 12/24/2017  Primary Physician: [name], MD Primary Cardiologist: Ulla Potash Loron Weimer. is a 72 y.o. male who is being seen today for the evaluation of at the request of  dizziness   HPI Ricky Rowland. is a 72 y.o. male seen for recurrent episodes of dizziness.  Many of these have been associated with eating and her abdominal cramping.  There are associated flushing diaphoresis and warmth  He was hospitalized for an episode of presyncope.  Is also had falls.  On occasion he fell while walking his dog.  "Everything started flipping around his head "fell on his face.  Cardiac ultrasound demonstrated mild bilateral stenosis right less than 39 left less than 60.  Orthostatic vital signs have been repeatedly unrevealing  DATE TEST EF   8/19 Echo   50-55 %             Past Medical History:  Diagnosis Date  . Borderline diabetes    DIET CONTROLLED  . Dyslipidemia   . Elevated PSA   . External carotid artery stenosis    MILD-- BILATERAL  . Fall 11/05/2017  . GERD (gastroesophageal reflux disease)   . Gout, arthritis   . Hearing loss in left ear   . Heart murmur    MILD --  ASYMPTOMATIC  . History of gastric ulcer    REMOTE HX  . Hypertension   . Pre-diabetes   . Prostate cancer ([place]) 12/21/12   Gleason 3+4=7  . Wears glasses       Surgical History:  Past Surgical History:  Procedure Laterality Date  . KNEE ARTHROSCOPY W/ MENISCAL REPAIR Left JAN 2014  . [place]  . PROSTATE BIOPSY N/A 12/21/2012   Procedure: BIOPSY TRANSRECTAL ULTRASONIC PROSTATE (TUBP);  Surgeon: [name], MD;  Location[facility];  Service: Urology;  Laterality: N/A;  . PROSTATE BIOPSY  11/26/11   benign     Home Meds: Current Meds  Medication Sig  . acetaminophen  (TYLENOL) 500 MG tablet Take 500 mg by mouth every 8 (eight) hours.   Marland Kitchen allopurinol (ZYLOPRIM) 300 MG tablet Take 300 mg by mouth at bedtime.   Marland Kitchen amLODipine-valsartan (EXFORGE) 10-320 MG tablet Take 1 tablet by mouth daily.   . colchicine 0.6 MG tablet Take 0.6 mg by mouth 2 (two) times daily as needed (for gout).   . meclizine (ANTIVERT) 25 MG tablet Take 1 tablet (25 mg total) by mouth 3 (three) times daily as needed for dizziness.  . Naphazoline HCl (CLEAR EYES OP) Place 1 drop into both eyes See admin instructions. Instill 1 drop into both eyes every morning, may also use 1 drop both eyes later in the day as needed for dry eyes/irritation  . REPATHA SURECLICK 681 MG/ML SOAJ Inject 140 mg into the skin every 14 (fourteen) days.   Marland Kitchen tolnaftate (TINACTIN) 1 % cream Apply 1 application topically daily as needed (jock itch).    Allergies:  Allergies  Allergen Reactions  . Atorvastatin Other (See Comments)    Joint pain   . Statins Other (See Comments)    Joint pain    Social History   Socioeconomic History  . Marital status: Married    Spouse name: geraldine  . Number of children: 3  . Years of education:  some colle  . Highest education level: Not on file  Occupational History  . Occupation: retired  Scientific laboratory technician  . Financial resource strain: Not on file  . Food insecurity:    Worry: Not on file    Inability: Not on file  . Transportation needs:    Medical: Not on file    Non-medical: Not on file  Tobacco Use  . Smoking status: Former Smoker    Packs/day: 1.00    Years: 30.00    Pack years: 30.00    Types: Cigarettes    Last attempt to quit: 12/15/1997    Years since quitting: 20.0  . Smokeless tobacco: Never Used  Substance and Sexual Activity  . Alcohol use: Yes    Alcohol/week: 20.0 standard drinks    Types: 20 Shots of liquor per week    Comment: OCCASIONAL  . Drug use: No  . Sexual activity: Not on file  Lifestyle  . Physical activity:    Days per week: Not  on file    Minutes per session: Not on file  . Stress: Not on file  Relationships  . Social connections:    Talks on phone: Not on file    Gets together: Not on file    Attends religious service: Not on file    Active member of club or organization: Not on file    Attends meetings of clubs or organizations: Not on file    Relationship status: Not on file  . Intimate partner violence:    Fear of current or ex partner: Not on file    Emotionally abused: Not on file    Physically abused: Not on file    Forced sexual activity: Not on file  Other Topics Concern  . Not on file  Social History Narrative   Lives with wife at home    caffeine use- occas, mostly decaf coffee     Family History  Problem Relation Age of Onset  . Heart disease Mother   . Heart disease Father   . Cancer Sister        lung  . Cancer Brother        prostate    ROS:  Please see the history of present illness.     All other systems reviewed and negative.    Physical Exam Blood pressure (!) 142/77, pulse (!) 57, height 6\' 3"  (1.905 m), weight 205 lb 6.4 oz (93.2 kg), SpO2 97 %. General: Well developed, well nourished male in no acute distress. Head: Normocephalic, atraumatic, sclera non-icteric, no xanthomas, nares are without discharge. EENT: normal  Lymph Nodes:  none Neck: Negative for carotid bruits. JVD not elevated. Back:without scoliosis kyphosis Lungs: Clear bilaterally to auscultation without wheezes, rales, or rhonchi. Breathing is unlabored. Heart: RRR with S1 S2. 2/6 systolic murmur . No rubs, or gallops appreciated. Abdomen: Soft, non-tender, non-distended with normoactive bowel sounds. No hepatomegaly. No rebound/guarding. No obvious abdominal masses. Msk:  Strength and tone appear normal for age. Extremities: No clubbing or cyanosis. No edema.  Distal pedal pulses are 2+ and equal bilaterally. Skin: Warm and Dry Neuro: Alert and oriented X 3. CN III-XII intact Grossly normal sensory and  motor function . Psych:  Responds to questions appropriately with a normal affect.      Labs: Cardiac Enzymes No results for input(s): CKTOTAL, CKMB, TROPONINI in the last 72 hours. CBC Lab Results  Component Value Date   WBC 8.9 11/06/2017   HGB 14.3 11/06/2017   HCT  43.1 11/06/2017   MCV 98.6 11/06/2017   PLT 151 11/06/2017   PROTIME: No results for input(s): LABPROT, INR in the last 72 hours. Chemistry No results for input(s): NA, K, CL, CO2, BUN, CREATININE, CALCIUM, PROT, BILITOT, ALKPHOS, ALT, AST, GLUCOSE in the last 168 hours.  Invalid input(s): LABALBU Lipids Lab Results  Component Value Date   CHOL 168 11/06/2017   HDL 68 11/06/2017   LDLCALC 67 11/06/2017   TRIG 166 (H) 11/06/2017   BNP No results found for: PROBNP Thyroid Function Tests: No results for input(s): TSH, T4TOTAL, T3FREE, THYROIDAB in the last 72 hours.  Invalid input(s): FREET3 Miscellaneous No results found for: DDIMER  Radiology/Studies:  No results found.  EKG: Sinus rhythm at 57 Interval 17/09/41 Axis left -33  Event recorder without arrhythmic abnormalities personally reviewed   Assessment and Plan:  Syncope prob neurally mediated  Hypertension  The pt has had recurrent syncope over the last few years most of which sound like neurally mediated with stereotypical prodrome and recovery.  More recently there have been presyncopal spells out of the ordinary, in that they have  occurred while seated.  There has been an association with GI symptoms   His ECG is notable only for mild left axis deviation; echocardiogram is normal.  The likelihood of a significant arrhythmia is low although the left axis deviation raises some concern.  We discussed the utility of an implantable loop recorder to try to discern mechanism with the next event.  He is agreeable to proceeding.     [name]

## 2017-12-24 NOTE — Progress Notes (Addendum)
ELECTROPHYSIOLOGY CONSULT NOTE  Patient ID: Ricky Rowland., MRN: [id number], DOB/AGE: [birth year] 72 y.o. Admit date: (Not on file) Date of Consult: 12/24/2017  Primary Physician: [name], MD Primary Cardiologist: Ulla Potash Lemmie Vanlanen. is a 72 y.o. male who is being seen today for the evaluation of at the request of  dizziness   HPI Ricky Rowland. is a 72 y.o. male seen for recurrent episodes of dizziness.  Many of these have been associated with eating and her abdominal cramping.  There are associated flushing diaphoresis and warmth  He was hospitalized for an episode of presyncope.  Is also had falls.  On occasion he fell while walking his dog.  "Everything started flipping around his head "fell on his face.  Cardiac ultrasound demonstrated mild bilateral stenosis right less than 39 left less than 60.  Orthostatic vital signs have been repeatedly unrevealing  DATE TEST EF   8/19 Echo   50-55 %          Clarifying history: 2 different types of spells One is brief and mostly lightheadedness, lasting 30 sec or so, [place] and assoc with walking; without residual  He thinks, but I am not convinced, that they are attenuated by frequent eating 2 is longer, assoc with nausea, diaphoretic and lightheadedness, this assoc with a sensation of swirling-- never occurs with head rotation.  Duration 30-60 min and assoc with persistent orthostatic intolerance      Past Medical History:  Diagnosis Date  . Borderline diabetes    DIET CONTROLLED  . Dyslipidemia   . Elevated PSA   . External carotid artery stenosis    MILD-- BILATERAL  . Fall 11/05/2017  . GERD (gastroesophageal reflux disease)   . Gout, arthritis   . Hearing loss in left ear   . Heart murmur    MILD --  ASYMPTOMATIC  . History of gastric ulcer    REMOTE HX  . Hypertension   . Pre-diabetes   . Prostate cancer ([place]) 12/21/12   Gleason 3+4=7  . Wears glasses      Surgical History:  Past Surgical History:  Procedure Laterality Date  . KNEE ARTHROSCOPY W/ MENISCAL REPAIR Left JAN 2014  . [place]  . PROSTATE BIOPSY N/A 12/21/2012   Procedure: BIOPSY TRANSRECTAL ULTRASONIC PROSTATE (TUBP);  Surgeon: [name], MD;  Location: [facility];  Service: Urology;  Laterality: N/A;  . PROSTATE BIOPSY  11/26/11   benign     Home Meds: Current Meds  Medication Sig  . acetaminophen (TYLENOL) 500 MG tablet Take 500 mg by mouth every 8 (eight) hours.   Marland Kitchen allopurinol (ZYLOPRIM) 300 MG tablet Take 300 mg by mouth at bedtime.   Marland Kitchen amLODipine-valsartan (EXFORGE) 10-320 MG tablet Take 1 tablet by mouth daily.   . colchicine 0.6 MG tablet Take 0.6 mg by mouth 2 (two) times daily as needed (for gout).   . meclizine (ANTIVERT) 25 MG tablet Take 1 tablet (25 mg total) by mouth 3 (three) times daily as needed for dizziness.  . Naphazoline HCl (CLEAR EYES OP) Place 1 drop into both eyes See admin instructions. Instill 1 drop into both eyes every morning, may also use 1 drop both eyes later in the day as needed for dry eyes/irritation  . REPATHA SURECLICK 267 MG/ML SOAJ Inject 140 mg into the skin every 14 (fourteen) days.   Marland Kitchen tolnaftate (TINACTIN)  1 % cream Apply 1 application topically daily as needed (jock itch).    Allergies:  Allergies  Allergen Reactions  . Atorvastatin Other (See Comments)    Joint pain   . Statins Other (See Comments)    Joint pain    Social History   Socioeconomic History  . Marital status: Married    Spouse name: geraldine  . Number of children: 3  . Years of education: some colle  . Highest education level: Not on file  Occupational History  . Occupation: retired  Scientific laboratory technician  . Financial resource strain: Not on file  . Food insecurity:    Worry: Not on file    Inability: Not on file  . Transportation needs:    Medical: Not on file    Non-medical: Not on file  Tobacco Use  . Smoking  status: Former Smoker    Packs/day: 1.00    Years: 30.00    Pack years: 30.00    Types: Cigarettes    Last attempt to quit: 12/15/1997    Years since quitting: 20.0  . Smokeless tobacco: Never Used  Substance and Sexual Activity  . Alcohol use: Yes    Alcohol/week: 20.0 standard drinks    Types: 20 Shots of liquor per week    Comment: OCCASIONAL  . Drug use: No  . Sexual activity: Not on file  Lifestyle  . Physical activity:    Days per week: Not on file    Minutes per session: Not on file  . Stress: Not on file  Relationships  . Social connections:    Talks on phone: Not on file    Gets together: Not on file    Attends religious service: Not on file    Active member of club or organization: Not on file    Attends meetings of clubs or organizations: Not on file    Relationship status: Not on file  . Intimate partner violence:    Fear of current or ex partner: Not on file    Emotionally abused: Not on file    Physically abused: Not on file    Forced sexual activity: Not on file  Other Topics Concern  . Not on file  Social History Narrative   Lives with wife at home    caffeine use- occas, mostly decaf coffee     Family History  Problem Relation Age of Onset  . Heart disease Mother   . Heart disease Father   . Cancer Sister        lung  . Cancer Brother        prostate    ROS:  Please see the history of present illness.     All other systems reviewed and negative.    Physical Exam Blood pressure (!) 142/77, pulse (!) 57, height 6\' 3"  (1.905 m), weight 205 lb 6.4 oz (93.2 kg), SpO2 97 %. General: Well developed, well nourished male in no acute distress. Head: Normocephalic, atraumatic, sclera non-icteric, no xanthomas, nares are without discharge. EENT: normal  Lymph Nodes:  none Neck: Negative for carotid bruits. JVD not elevated. Back:without scoliosis kyphosis Lungs: Clear bilaterally to auscultation without wheezes, rales, or rhonchi. Breathing is  unlabored. Heart: RRR with S1 S2. 2/6 systolic murmur . No rubs, or gallops appreciated. Abdomen: Soft, non-tender, non-distended with normoactive bowel sounds. No hepatomegaly. No rebound/guarding. No obvious abdominal masses. Msk:  Strength and tone appear normal for age. Extremities: No clubbing or cyanosis. No edema.  Distal pedal pulses  are 2+ and equal bilaterally. Skin: Warm and Dry Neuro: Alert and oriented X 3. CN III-XII intact Grossly normal sensory and motor function . Psych:  Responds to questions appropriately with a normal affect.      Labs: Cardiac Enzymes No results for input(s): CKTOTAL, CKMB, TROPONINI in the last 72 hours. CBC Lab Results  Component Value Date   WBC 8.9 11/06/2017   HGB 14.3 11/06/2017   HCT 43.1 11/06/2017   MCV 98.6 11/06/2017   PLT 151 11/06/2017   PROTIME: No results for input(s): LABPROT, INR in the last 72 hours. Chemistry No results for input(s): NA, K, CL, CO2, BUN, CREATININE, CALCIUM, PROT, BILITOT, ALKPHOS, ALT, AST, GLUCOSE in the last 168 hours.  Invalid input(s): LABALBU Lipids Lab Results  Component Value Date   CHOL 168 11/06/2017   HDL 68 11/06/2017   LDLCALC 67 11/06/2017   TRIG 166 (H) 11/06/2017   BNP No results found for: PROBNP Thyroid Function Tests: No results for input(s): TSH, T4TOTAL, T3FREE, THYROIDAB in the last 72 hours.  Invalid input(s): FREET3 Miscellaneous No results found for: DDIMER  Radiology/Studies:  No results found.  EKG: Sinus rhythm at 57 Interval 17/09/41 Axis left -33  Event recorder without arrhythmic abnormalities personally reviewed   Assessment and Plan:  Syncope prob neurally mediated  Hypertension  The pt has had recurrent syncope over the last few years most of which sound like neurally mediated with stereotypical prodrome and recovery.  More recently there have been presyncopal spells out of the ordinary, in that they have  occurred while seated.  There has been an  association with GI symptoms   His ECG is notable only for mild left axis deviation; echocardiogram is normal.  The likelihood of a significant arrhythmia is low although the left axis deviation raises some concern.  We discussed the utility of an implantable loop recorder to try to discern mechanism with the next event.  He is agreeable to proceeding.     [name]

## 2017-12-28 ENCOUNTER — Encounter

## 2017-12-28 ENCOUNTER — Encounter: Payer: Self-pay | Admitting: Diagnostic Neuroimaging

## 2017-12-28 ENCOUNTER — Ambulatory Visit: Payer: Medicare Other | Admitting: Diagnostic Neuroimaging

## 2017-12-28 VITALS — BP 119/69 | HR 59 | Ht 75.0 in | Wt 206.4 lb

## 2017-12-28 DIAGNOSIS — R55 Syncope and collapse: Secondary | ICD-10-CM | POA: Diagnosis not present

## 2017-12-28 NOTE — Progress Notes (Signed)
[facility]  PATIENT: Ricky COYE Sr. DOB: 05-29-1945  REFERRING CLINICIAN: Polite HISTORY FROM: patient and chart review  REASON FOR VISIT: follow up   HISTORICAL  CHIEF COMPLAINT:  Chief Complaint  Patient presents with  . Syncope and collapse    rm 7, "falls, lightheadedness at random times"    HISTORY OF PRESENT ILLNESS:   UPDATE (12/28/17, VRP): Since last visit, here for recurrent attacks of lightheadedness and syncope. 11/05/17, had unprovoked syncope. Had prodrome lightheaded feeling, then fell to ground. Woke up in a few seconds, and was able to call his wife to help. He went to ER and was admitted. Workup negative. Now has stopped etoh use (previously 2-4 drinks per day consistently).   UPDATE 11/13/15: Since last visit, doing well. Saw cardiology, who referred pt to vestibular PT, who found orthostatic hypotension (SBP 156 --> 99). Now BP better controlled / regulated, and patient not having any more events.   PRIOR HPI (09/13/15): 72 year old male with hypertension, hypercholesteremia, prostate cancer, here for evaluation of dizziness attacks. Symptoms started in 2014. Patient has 8 to 12 episodes per year. No specific triggering factors. Patient had one attack last week and another one the week before. Previously last attack was in November 2016. Patient describes a sensation of intestine, stomach upset sensation, overheating sensation, severe sweats, lightheadedness and nausea. Sometimes patient vomits. Patient feels off balance during these attacks. No unilateral numbness, tingling, slurred speech, double vision or headaches with the attacks. Patient typically tries to make it home, lay down and go to sleep. If patient vomits or has a bowel movement after the tachycardia started, seems to relieve the sensation and symptom. If patient is laying flat after an attack, if he tries to sit up symptoms are exacerbated. Patient has never measured his blood  pressure during these attacks. Patient has got to the emergency room for several of these attacks while in the emergency room hypotension has not been documented. Patient denies any palpitations, chest pain, shortness of breath. Episodes of occurred in standing or sitting position. Episodes of occurred at home as well as on the golf course. No specific triggering factors. In the past patient has had MRI of the brain, cardiac testing, heart monitor, nuclear medicine stress test, which appeared unremarkable. These testing were done 2-3 years ago.   REVIEW OF SYSTEMS: Full 14 system review of systems performed and negative except as per HPI.    ALLERGIES: Allergies  Allergen Reactions  . Atorvastatin Other (See Comments)    Joint pain   . Statins Other (See Comments)    Joint pain    HOME MEDICATIONS: Outpatient Medications Prior to Visit  Medication Sig Dispense Refill  . acetaminophen (TYLENOL) 500 MG tablet Take 500 mg by mouth every 8 (eight) hours.     Marland Kitchen allopurinol (ZYLOPRIM) 300 MG tablet Take 300 mg by mouth at bedtime.     Marland Kitchen amLODipine-valsartan (EXFORGE) 10-320 MG tablet Take 1 tablet by mouth daily.     . Naphazoline HCl (CLEAR EYES OP) Place 1 drop into both eyes See admin instructions. Instill 1 drop into both eyes every morning, may also use 1 drop both eyes later in the day as needed for dry eyes/irritation    . REPATHA SURECLICK 854 MG/ML SOAJ Inject 140 mg into the skin every 14 (fourteen) days.     Marland Kitchen tolnaftate (TINACTIN) 1 % cream Apply 1 application topically daily as needed (jock itch).    . colchicine 0.6 MG  tablet Take 0.6 mg by mouth 2 (two) times daily as needed (for gout).     . meclizine (ANTIVERT) 25 MG tablet Take 1 tablet (25 mg total) by mouth 3 (three) times daily as needed for dizziness. (Patient not taking: Reported on 12/28/2017) 30 tablet 0   No facility-administered medications prior to visit.     PAST MEDICAL HISTORY: Past Medical History:  Diagnosis  Date  . Borderline diabetes    DIET CONTROLLED  . Dyslipidemia   . Elevated PSA   . External carotid artery stenosis    MILD-- BILATERAL  . Fall 11/05/2017  . GERD (gastroesophageal reflux disease)   . Gout, arthritis   . Hearing loss in left ear   . Heart murmur    MILD --  ASYMPTOMATIC  . History of gastric ulcer    REMOTE HX  . Hypertension   . Pre-diabetes   . Prostate cancer ([place]) 12/21/12   Gleason 3+4=7  . Wears glasses     PAST SURGICAL HISTORY: Past Surgical History:  Procedure Laterality Date  . KNEE ARTHROSCOPY W/ MENISCAL REPAIR Left JAN 2014  . [place]  . PROSTATE BIOPSY N/A 12/21/2012   Procedure: BIOPSY TRANSRECTAL ULTRASONIC PROSTATE (TUBP);  Surgeon: [name], MD;  Location: [facility];  Service: Urology;  Laterality: N/A;  . PROSTATE BIOPSY  11/26/11   benign    FAMILY HISTORY: Family History  Problem Relation Age of Onset  . Heart disease Mother   . Heart disease Father   . Cancer Sister        lung  . Cancer Brother        prostate    SOCIAL HISTORY:  Social History   Socioeconomic History  . Marital status: Married    Spouse name: geraldine  . Number of children: 3  . Years of education: some college  . Highest education level: Not on file  Occupational History  . Occupation: retired  Scientific laboratory technician  . Financial resource strain: Not on file  . Food insecurity:    Worry: Not on file    Inability: Not on file  . Transportation needs:    Medical: Not on file    Non-medical: Not on file  Tobacco Use  . Smoking status: Former Smoker    Packs/day: 1.00    Years: 30.00    Pack years: 30.00    Types: Cigarettes    Last attempt to quit: 12/15/1997    Years since quitting: 20.0  . Smokeless tobacco: Never Used  Substance and Sexual Activity  . Alcohol use: Yes    Alcohol/week: 20.0 standard drinks    Types: 20 Shots of liquor per week    Comment: OCCASIONAL, 12/28/17 stopped 11/05/17  . Drug  use: No  . Sexual activity: Not on file  Lifestyle  . Physical activity:    Days per week: Not on file    Minutes per session: Not on file  . Stress: Not on file  Relationships  . Social connections:    Talks on phone: Not on file    Gets together: Not on file    Attends religious service: Not on file    Active member of club or organization: Not on file    Attends meetings of clubs or organizations: Not on file    Relationship status: Not on file  . Intimate partner violence:    Fear of current or ex partner: Not on file    Emotionally  abused: Not on file    Physically abused: Not on file    Forced sexual activity: Not on file  Other Topics Concern  . Not on file  Social History Narrative   Lives with wife at home    caffeine use- occas, mostly decaf coffee     PHYSICAL EXAM   GENERAL EXAM/CONSTITUTIONAL: Vitals:  Vitals:   12/28/17 1403  BP: 119/69  Pulse: (!) 59  Weight: 206 lb 6.4 oz (93.6 kg)  Height: 6\' 3"  (1.905 m)    No data found.   Body mass index is 25.8 kg/m. No exam data present  Patient is in no distress; well developed, nourished and groomed; neck is supple  CARDIOVASCULAR:  Examination of carotid arteries is normal; no carotid bruits  Regular rate and rhythm, no murmurs  Examination of peripheral vascular system by observation and palpation is normal  EYES:  Ophthalmoscopic exam of optic discs and posterior segments is normal; no papilledema or hemorrhages  MUSCULOSKELETAL:  Gait, strength, tone, movements noted in Neurologic exam below  NEUROLOGIC: MENTAL STATUS:  No flowsheet data found.  awake, alert, oriented to person, place and time  recent and remote memory intact  normal attention and concentration  language fluent, comprehension intact, naming intact,   fund of knowledge appropriate  CRANIAL NERVE:   2nd - no papilledema on fundoscopic exam  2nd, 3rd, 4th, 6th - pupils equal and reactive to light, visual  fields full to confrontation, extraocular muscles intact, no nystagmus  5th - facial sensation symmetric  7th - facial strength symmetric  8th - hearing intact  9th - palate elevates symmetrically, uvula midline  11th - shoulder shrug symmetric  12th - tongue protrusion midline  MOTOR:   normal bulk and tone, full strength in the BUE, BLE  SENSORY:   normal and symmetric to light touch, temperature, vibration  COORDINATION:   finger-nose-finger, fine finger movements normal  REFLEXES:   deep tendon reflexes present and symmetric  GAIT/STATION:   narrow based gait; romberg is negative    DIAGNOSTIC DATA (LABS, IMAGING, TESTING) - I reviewed patient records, labs, notes, testing and imaging myself where available.  Lab Results  Component Value Date   WBC 8.9 11/06/2017   HGB 14.3 11/06/2017   HCT 43.1 11/06/2017   MCV 98.6 11/06/2017   PLT 151 11/06/2017      Component Value Date/Time   NA 139 11/06/2017 0524   NA 140 06/18/2016 0857   K 3.9 11/06/2017 0524   K 4.2 06/18/2016 0857   CL 104 11/06/2017 0524   CO2 27 11/06/2017 0524   CO2 25 06/18/2016 0857   GLUCOSE 112 (H) 11/06/2017 0524   GLUCOSE 126 06/18/2016 0857   BUN 15 11/06/2017 0524   BUN 14.7 06/18/2016 0857   CREATININE 1.20 11/06/2017 0524   CREATININE 1.1 06/18/2016 0857   CALCIUM 9.3 11/06/2017 0524   CALCIUM 10.1 06/18/2016 0857   PROT 7.0 07/09/2017 1008   PROT 7.8 06/18/2016 0857   ALBUMIN 4.0 07/09/2017 1008   ALBUMIN 4.0 06/18/2016 0857   AST 40 (H) 07/09/2017 1008   AST 43 (H) 06/18/2016 0857   ALT 28 07/09/2017 1008   ALT 41 06/18/2016 0857   ALKPHOS 70 07/09/2017 1008   ALKPHOS 79 06/18/2016 0857   BILITOT 0.4 07/09/2017 1008   BILITOT 0.73 06/18/2016 0857   GFRNONAA 58 (L) 11/06/2017 0524   GFRAA >60 11/06/2017 0524   Lab Results  Component Value Date   CHOL 168  11/06/2017   HDL 68 11/06/2017   LDLCALC 67 11/06/2017   TRIG 166 (H) 11/06/2017   CHOLHDL 2.5  11/06/2017   Lab Results  Component Value Date   HGBA1C 5.0 11/06/2017   No results found for: [id number] Lab Results  Component Value Date   TSH 0.800 11/05/2017    01/10/13 nuclear stress test 1. No fixed or reversible defects to suggest inducible ischemia. 2. Normal left ventricular wall motion and thickening. 3. Normal ejection fraction.  02/03/15 MRI brain (with and without) [I reviewed images myself and agree with interpretation. -VRP]  1. No acute intracranial abnormality or mass. 2. Chronic right basal ganglia lacunar infarcts. 3. Mild cerebral and cerebellar atrophy.  09/07/15 EKG [I reviewed images myself and agree with interpretation. -VRP]  - normal sinus rhythm  10/23/15 EEG  - normal  11/06/17 carotid u/s - Right Carotid: Velocities in the right ICA are consistent with a 1-39% stenosis. - Left Carotid: Velocities in the left ICA are consistent with a 40-59% stenosis.     ASSESSMENT AND PLAN  72 y.o. year old male here with intermittent unprovoked episodes since 2014 of lightheadedness, presyncope, nausea, diaphoresis. Findings suspicious for generalized hypotension / hypoperfusion events.    Dx: recurrent syncope / transient hypotension  1. Syncope, unspecified syncope type      PLAN:  RECURRENT SYNCOPE - hydration - alcohol abstinence - monitor BP at home - follow up with cardiology  - no driving x 6 months event free (due to unprovoked syncope)  LEFT CAROTID STENOSIS (asymptomatic) - vascular risk factor mgmt - follow up with vascular sx clinic  Return if symptoms worsen or fail to improve, for return to PCP and cardiology.    Penni Bombard, MD 35/57/3220, 2:54 PM Certified in Neurology, Neurophysiology and Neuroimaging  [facility] [address], [place], [place] [address] [phone]

## 2017-12-28 NOTE — Patient Instructions (Addendum)
  RECURRENT SYNCOPE - stay hydrated with water  - continue alcohol cessation  - monitor BP at home  - follow up with cardiology  - no driving x 6 months event free (due to unprovoked syncope); according to [place] law, you can not drive unless you are syncope free for at least 6 months and under physician's care.   - Please maintain precautions. Do not participate in activities where a loss of awareness could harm you or someone else. No swimming alone, no tub bathing, no hot tubs, no driving, no operating motorized vehicles (cars, ATVs, motocycles, etc), lawnmowers, power tools or firearms. No standing at heights, such as rooftops, ladders or stairs. Avoid hot objects such as stoves, heaters, open fires. Wear a helmet when riding a bicycle, scooter, skateboard, etc. and avoid areas of traffic. Set your water heater to 120 degrees or less.

## 2018-01-05 ENCOUNTER — Encounter (HOSPITAL_COMMUNITY): Payer: Self-pay | Admitting: Internal Medicine

## 2018-01-05 ENCOUNTER — Other Ambulatory Visit: Payer: Self-pay

## 2018-01-05 ENCOUNTER — Encounter (HOSPITAL_COMMUNITY)
Admission: RE | Disposition: A | Discharge status: Home / Self Care | Payer: Self-pay | Source: Ambulatory Visit | Attending: Internal Medicine

## 2018-01-05 ENCOUNTER — Ambulatory Visit (HOSPITAL_COMMUNITY)
Admission: RE | Admit: 2018-01-05 | Discharge: 2018-01-05 | Disposition: A | Discharge status: Home / Self Care | Payer: Medicare Other | Source: Ambulatory Visit | Attending: Internal Medicine | Admitting: Internal Medicine

## 2018-01-05 DIAGNOSIS — Z888 Allergy status to other drugs, medicaments and biological substances status: Secondary | ICD-10-CM | POA: Insufficient documentation

## 2018-01-05 DIAGNOSIS — Z87891 Personal history of nicotine dependence: Secondary | ICD-10-CM | POA: Insufficient documentation

## 2018-01-05 DIAGNOSIS — Z8719 Personal history of other diseases of the digestive system: Secondary | ICD-10-CM | POA: Insufficient documentation

## 2018-01-05 DIAGNOSIS — Z8042 Family history of malignant neoplasm of prostate: Secondary | ICD-10-CM | POA: Diagnosis not present

## 2018-01-05 DIAGNOSIS — R55 Syncope and collapse: Secondary | ICD-10-CM

## 2018-01-05 DIAGNOSIS — Z8546 Personal history of malignant neoplasm of prostate: Secondary | ICD-10-CM | POA: Diagnosis not present

## 2018-01-05 DIAGNOSIS — R7303 Prediabetes: Secondary | ICD-10-CM | POA: Insufficient documentation

## 2018-01-05 DIAGNOSIS — R011 Cardiac murmur, unspecified: Secondary | ICD-10-CM | POA: Insufficient documentation

## 2018-01-05 DIAGNOSIS — Z801 Family history of malignant neoplasm of trachea, bronchus and lung: Secondary | ICD-10-CM | POA: Diagnosis not present

## 2018-01-05 DIAGNOSIS — M109 Gout, unspecified: Secondary | ICD-10-CM | POA: Insufficient documentation

## 2018-01-05 DIAGNOSIS — K219 Gastro-esophageal reflux disease without esophagitis: Secondary | ICD-10-CM | POA: Diagnosis not present

## 2018-01-05 DIAGNOSIS — H9192 Unspecified hearing loss, left ear: Secondary | ICD-10-CM | POA: Insufficient documentation

## 2018-01-05 DIAGNOSIS — Z9889 Other specified postprocedural states: Secondary | ICD-10-CM | POA: Insufficient documentation

## 2018-01-05 DIAGNOSIS — Z79899 Other long term (current) drug therapy: Secondary | ICD-10-CM | POA: Insufficient documentation

## 2018-01-05 DIAGNOSIS — Z8249 Family history of ischemic heart disease and other diseases of the circulatory system: Secondary | ICD-10-CM | POA: Diagnosis not present

## 2018-01-05 DIAGNOSIS — I6523 Occlusion and stenosis of bilateral carotid arteries: Secondary | ICD-10-CM | POA: Diagnosis not present

## 2018-01-05 DIAGNOSIS — I10 Essential (primary) hypertension: Secondary | ICD-10-CM | POA: Insufficient documentation

## 2018-01-05 DIAGNOSIS — E785 Hyperlipidemia, unspecified: Secondary | ICD-10-CM | POA: Insufficient documentation

## 2018-01-05 HISTORY — PX: LOOP RECORDER INSERTION: EP1214

## 2018-01-05 SURGERY — LOOP RECORDER INSERTION

## 2018-01-05 MED ORDER — LIDOCAINE-EPINEPHRINE 1 %-1:100000 IJ SOLN
INTRAMUSCULAR | Status: AC
  Filled 2018-01-05: qty 1

## 2018-01-05 MED ORDER — LIDOCAINE-EPINEPHRINE 1 %-1:100000 IJ SOLN
INTRAMUSCULAR | Status: DC | PRN
  Administered 2018-01-05: 30 mL

## 2018-01-05 SURGICAL SUPPLY — 2 items
LOOP REVEAL LINQSYS (Prosthesis & Implant Heart) ×3 IMPLANT
PACK LOOP INSERTION (CUSTOM PROCEDURE TRAY) ×3 IMPLANT

## 2018-01-05 NOTE — Interval H&P Note (Signed)
History and Physical Interval Note:  01/05/2018 7:46 AM  Ricky Edinger Sr.  has presented today for surgery, with the diagnosis of syncope  The various methods of treatment have been discussed with the patient and family. After consideration of risks, benefits and other options for treatment, the patient has consented to  Procedure(s): LOOP RECORDER INSERTION (N/A) as a surgical intervention .  The patient's history has been reviewed, patient examined, no change in status, stable for surgery.  I have reviewed the patient's chart and labs.  Questions were answered to the patient's satisfaction.     [name]

## 2018-01-21 ENCOUNTER — Ambulatory Visit (INDEPENDENT_AMBULATORY_CARE_PROVIDER_SITE_OTHER): Payer: Medicare Other | Admitting: *Deleted

## 2018-01-21 DIAGNOSIS — R42 Dizziness and giddiness: Secondary | ICD-10-CM

## 2018-01-21 DIAGNOSIS — R55 Syncope and collapse: Secondary | ICD-10-CM

## 2018-01-21 LAB — CUP PACEART INCLINIC DEVICE CHECK
Date Time Interrogation Session: 20191114130456
Implantable Pulse Generator Implant Date: 20191029

## 2018-01-21 NOTE — Progress Notes (Signed)
Wound check appointment. Steri-strips removed prior to appointment. Wound without redness or edema. Incision edges approximated, wound well healed. Battery status: Good. R-waves 0.53mV. 0 symptom episodes, 0 tachy episodes, 0 pause episodes, 0 brady episodes. 0 AF episodes (0% burden). Monthly summary reports and ROV with SK 04/15/2018.

## 2018-02-08 ENCOUNTER — Ambulatory Visit (INDEPENDENT_AMBULATORY_CARE_PROVIDER_SITE_OTHER): Payer: Medicare Other

## 2018-02-08 DIAGNOSIS — R55 Syncope and collapse: Secondary | ICD-10-CM

## 2018-02-08 DIAGNOSIS — R42 Dizziness and giddiness: Secondary | ICD-10-CM

## 2018-02-08 NOTE — Progress Notes (Signed)
[facility] Summary Report / Loop Recorder 

## 2018-02-09 NOTE — Progress Notes (Signed)
Cardiology Office Note:    Date:  02/10/2018   ID:  Ricky Edinger Sr., DOB [birth year], MRN [id number]  PCP:  Ricky Carol, MD  Cardiologist:  Ricky Rouge, MD  Referring MD: Ricky Carol, MD   No chief complaint on file.   History of Present Illness:    Ricky Rowland. is a 72 y.o. male with a past medical history significant for hypertension, hyperlipidemia, prostate cancer, diet-controlled diabetes and carotid stenosis. Long standing history of dizziness. First seen in August 2017 Negative previous w/u's Last admission 11/05/17   He was kept overnight for monitoring and telemetry did not reportedly reveal any arrhythmias.  He was only mildly orthostatic.  Echocardiogram showed normal LV systolic function EF 67-67%, mild LVH, no significant valvular disease.  Carotid ultrasound revealed 1-39% right ICA stenosis, 40-59% left ICA stenosis.  Seen by [facility] / EP service and had ILR implanted on 01/05/18  He was drinking about 3 Gin and tonic drinks every evening prior to this recent fall. He has stopped all alcohol for now. He is working on increasing his water intake- 64 oz/day.   No concerns since not drinking. Went to [facility] with wife for Thanksgiving and left the 3 kids At home to fend for themselves. Oldest daughter fixed meal   Past Medical History:  Diagnosis Date  . Borderline diabetes    DIET CONTROLLED  . Dyslipidemia   . Elevated PSA   . External carotid artery stenosis    MILD-- BILATERAL  . Fall 11/05/2017  . GERD (gastroesophageal reflux disease)   . Gout, arthritis   . Hearing loss in left ear   . Heart murmur    MILD --  ASYMPTOMATIC  . History of gastric ulcer    REMOTE HX  . Hypertension   . Pre-diabetes   . Prostate cancer ([place]) 12/21/12   Gleason 3+4=7  . Wears glasses     Past Surgical History:  Procedure Laterality Date  . KNEE ARTHROSCOPY W/ MENISCAL REPAIR Left JAN 2014  . LOOP RECORDER INSERTION N/A 01/05/2018   Procedure: LOOP  RECORDER INSERTION;  Surgeon: Ricky Sprang, MD;  Location: [place] [facility];  Service: Cardiovascular;  Laterality: N/A;  . LUMBAR SPINE SURGERY  1991  . PROSTATE BIOPSY N/A 12/21/2012   Procedure: BIOPSY TRANSRECTAL ULTRASONIC PROSTATE (TUBP);  Surgeon: Ricky Ben, MD;  Location[facility];  Service: Urology;  Laterality: N/A;  . PROSTATE BIOPSY  11/26/11   benign    Current Medications: Current Meds  Medication Sig  . acetaminophen (TYLENOL) 650 MG CR tablet Take 650 mg by mouth every 8 (eight) hours.  Marland Kitchen allopurinol (ZYLOPRIM) 300 MG tablet Take 300 mg by mouth daily.   Marland Kitchen amLODipine (NORVASC) 5 MG tablet Take 5 mg by mouth daily.  . colchicine 0.6 MG tablet Take 0.6 mg by mouth 2 (two) times daily as needed (for gout).   . Naphazoline HCl (CLEAR EYES OP) Place 1 drop into both eyes daily as needed (dry eyes).   Marland Kitchen omeprazole (PRILOSEC) 20 MG capsule Take 20 mg by mouth daily.  Marland Kitchen REPATHA SURECLICK 209 MG/ML SOAJ Inject 140 mg into the skin every 14 (fourteen) days.   Marland Kitchen tolnaftate (TINACTIN) 1 % cream Apply 1 application topically daily as needed (jock itch).  . valsartan (DIOVAN) 320 MG tablet Take 320 mg by mouth daily.  . [DISCONTINUED] amLODipine-valsartan (EXFORGE) 5-320 MG tablet Take 1 tablet by mouth daily.     Allergies:  Statins   Social History   Socioeconomic History  . Marital status: Married    Spouse name: geraldine  . Number of children: 3  . Years of education: some college  . Highest education level: Not on file  Occupational History  . Occupation: retired  Scientific laboratory technician  . Financial resource strain: Not on file  . Food insecurity:    Worry: Not on file    Inability: Not on file  . Transportation needs:    Medical: Not on file    Non-medical: Not on file  Tobacco Use  . Smoking status: Former Smoker    Packs/day: 1.00    Years: 30.00    Pack years: 30.00    Types: Cigarettes    Last attempt to quit: 12/15/1997    Years since  quitting: 20.1  . Smokeless tobacco: Never Used  Substance and Sexual Activity  . Alcohol use: Yes    Alcohol/week: 20.0 standard drinks    Types: 20 Shots of liquor per week    Comment: OCCASIONAL, 12/28/17 stopped 11/05/17  . Drug use: No  . Sexual activity: Not on file  Lifestyle  . Physical activity:    Days per week: Not on file    Minutes per session: Not on file  . Stress: Not on file  Relationships  . Social connections:    Talks on phone: Not on file    Gets together: Not on file    Attends religious service: Not on file    Active member of club or organization: Not on file    Attends meetings of clubs or organizations: Not on file    Relationship status: Not on file  Other Topics Concern  . Not on file  Social History Narrative   Lives with wife at home    caffeine use- occas, mostly decaf coffee     Family History: The patient's family history includes Cancer in his brother and sister; Heart disease in his father and mother. ROS:   Please see the history of present illness.     All other systems reviewed and are negative.  EKGs/Labs/Other Studies Reviewed:    The following studies were reviewed today:  Echocardiogram 11/06/2017 Study Conclusions - Left ventricle: The cavity size was normal. Wall thickness was   increased in a pattern of mild LVH. Systolic function was normal.   The estimated ejection fraction was in the range of 50% to 55%.   Wall motion was normal; there were no regional wall motion   abnormalities. - Aortic valve: Transvalvular velocity was within the normal range.   There was no stenosis. There was no significant regurgitation. - Mitral valve: There was trivial regurgitation. Valve area by   pressure half-time: 2.37 cm^2. - Left atrium: The atrium was mildly to moderately dilated. - Right ventricle: Systolic function was normal. - Atrial septum: No defect or patent foramen ovale was identified. - Tricuspid valve: There was mild  regurgitation. - Pulmonic valve: There was no significant regurgitation. - Pulmonary arteries: Systolic pressure was mildly increased. PA   peak pressure: 35 mm Hg (S).  Impressions: - Normal LV EF, no significant valvular disease.  Carotid ultrasound 11/07/2017 Final Interpretation: -Right Carotid: Velocities in the right ICA are consistent with a 1-39% stenosis. -Left Carotid: Velocities in the left ICA are consistent with a 40-59% stenosis.  EKG:  EKG is not ordered today.    Recent Labs: 07/09/2017: ALT 28 11/05/2017: TSH 0.800 11/06/2017: BUN 15; Creatinine, Ser 1.20; Hemoglobin 14.3;  Platelets 151; Potassium 3.9; Sodium 139   Recent Lipid Panel    Component Value Date/Time   CHOL 168 11/06/2017 0524   TRIG 166 (H) 11/06/2017 0524   HDL 68 11/06/2017 0524   CHOLHDL 2.5 11/06/2017 0524   VLDL 33 11/06/2017 0524   LDLCALC 67 11/06/2017 0524    Physical Exam:    BP 134/82   Pulse (!) 57   Ht 6\' 3"  (1.905 m)   Wt 210 lb (95.3 kg)   BMI 26.25 kg/m  Affect appropriate Healthy:  appears stated age 4: normal Neck supple with no adenopathy JVP normal no bruits no thyromegaly Lungs clear with no wheezing and good diaphragmatic motion Heart:  S1/S2 no murmur, no rub, gallop or click PMI normal ILR subQ left chest  Abdomen: benighn, BS positve, no tenderness, no AAA no bruit.  No HSM or HJR Distal pulses intact with no bruits No edema Neuro non-focal Skin warm and dry No muscular weakness   ASSESSMENT:    1. Carotid stenosis, left    PLAN:    In order of problems listed above:  Dizziness/near syncope: Doubt cardiac etiology. Dr Caryl Comes also felt neurally mediated ILR no arrhythmia to date TTE no structural heart disease and baseline ECG only chronic LAFB  Carotid stenosis: No symptoms of TIA or stroke.  Medical management with aspirin.  Apparently patient cannot tolerate statin.  Seen in the hospital by vascular surgery with planned 56-month follow-up with  Dr. Trula Slade. Duplex Ordered August 2020   Hypertension: BP well controlled. Not orthostatic in the office today.   Medication Adjustments/Labs and Tests Ordered: Current medicines are reviewed at length with the patient today.  Concerns regarding medicines are outlined above. Labs and tests ordered and medication changes are outlined in the patient instructions below:  Patient Instructions  Medication Instructions:   If you need a refill on your cardiac medications before your next appointment, please call your pharmacy.   Lab work:  If you have labs (blood work) drawn today and your tests are completely normal, you will receive your results only by: Marland Kitchen MyChart Message (if you have MyChart) OR . A paper copy in the mail If you have any lab test that is abnormal or we need to change your treatment, we will call you to review the results.  Testing/Procedures: Your physician has requested that you have a carotid duplex in August. This test is an ultrasound of the carotid arteries in your neck. It looks at blood flow through these arteries that supply the brain with blood. Allow one hour for this exam. There are no restrictions or special instructions.  Follow-Up: At [facility], you and your health needs are our priority.  As part of our continuing mission to provide you with exceptional heart care, we have created designated Provider Care Teams.  These Care Teams include your primary Cardiologist (physician) and Advanced Practice Providers (APPs -  Physician Assistants and Nurse Practitioners) who all work together to provide you with the care you need, when you need it. You will need a follow up appointment in 1 years.  Please call our office 2 months in advance to schedule this appointment.  You may see Ricky Rouge, MD or one of the following Advanced Practice Providers on your designated Care Team:   Truitt Merle, NP Cecilie Kicks, NP . Kathyrn Drown, NP     Signed, Ricky Rouge,  MD  02/10/2018 10:44 AM    [place]

## 2018-02-10 ENCOUNTER — Ambulatory Visit: Payer: Medicare Other | Admitting: Cardiovascular Disease

## 2018-02-10 VITALS — BP 134/82 | HR 57 | Ht 75.0 in | Wt 210.0 lb

## 2018-02-10 DIAGNOSIS — I6522 Occlusion and stenosis of left carotid artery: Secondary | ICD-10-CM

## 2018-02-10 NOTE — Patient Instructions (Signed)
Medication Instructions:   If you need a refill on your cardiac medications before your next appointment, please call your pharmacy.   Lab work:  If you have labs (blood work) drawn today and your tests are completely normal, you will receive your results only by: Marland Kitchen MyChart Message (if you have MyChart) OR . A paper copy in the mail If you have any lab test that is abnormal or we need to change your treatment, we will call you to review the results.  Testing/Procedures: Your physician has requested that you have a carotid duplex in August. This test is an ultrasound of the carotid arteries in your neck. It looks at blood flow through these arteries that supply the brain with blood. Allow one hour for this exam. There are no restrictions or special instructions.  Follow-Up: At [facility], you and your health needs are our priority.  As part of our continuing mission to provide you with exceptional heart care, we have created designated Provider Care Teams.  These Care Teams include your primary Cardiologist (physician) and Advanced Practice Providers (APPs -  Physician Assistants and Nurse Practitioners) who all work together to provide you with the care you need, when you need it. You will need a follow up appointment in 1 years.  Please call our office 2 months in advance to schedule this appointment.  You may see Jenkins Rouge, MD or one of the following Advanced Practice Providers on your designated Care Team:   Truitt Merle, NP Cecilie Kicks, NP . Kathyrn Drown, NP

## 2018-03-10 LAB — CUP PACEART REMOTE DEVICE CHECK
Date Time Interrogation Session: 20191201134050
MDC IDC PG IMPLANT DT: 20191029

## 2018-03-12 ENCOUNTER — Ambulatory Visit (INDEPENDENT_AMBULATORY_CARE_PROVIDER_SITE_OTHER): Payer: Medicare Other

## 2018-03-12 DIAGNOSIS — R55 Syncope and collapse: Secondary | ICD-10-CM | POA: Diagnosis not present

## 2018-03-12 LAB — CUP PACEART REMOTE DEVICE CHECK
Date Time Interrogation Session: 20200103131119
MDC IDC PG IMPLANT DT: 20191029

## 2018-03-15 NOTE — Progress Notes (Signed)
[facility] Summary Report / Loop Recorder 

## 2018-03-30 NOTE — Progress Notes (Signed)
History of Present Illness:  Patient is a 73 y.o. year old male who presents for evaluation of carotid stenosis.  Last 11/06/2017 ultrasound studies which show less than 50% right carotid stenosis and 50-69% left carotid stenosiswhen he was admitted to [facility] with an episode of blacking out.  He was kept overnight for monitoring and telemetry did not reportedly reveal any arrhythmias.  He was only mildly orthostatic.  Echocardiogram showed normal LV systolic function EF 57-01%, mild LVH, no significant valvular disease.  He is followed by Dr. Johnsie Cancel.  There is also a mention of chronic dizziness.      The patient denies symptoms of TIA, amaurosis, or stroke.   Other medical problems include HTN, gout, and an intolerance to statins.     Past Medical History:  Diagnosis Date  . Borderline diabetes    DIET CONTROLLED  . Dyslipidemia   . Elevated PSA   . External carotid artery stenosis    MILD-- BILATERAL  . Fall 11/05/2017  . GERD (gastroesophageal reflux disease)   . Gout, arthritis   . Hearing loss in left ear   . Heart murmur    MILD --  ASYMPTOMATIC  . History of gastric ulcer    REMOTE HX  . Hypertension   . Pre-diabetes   . Prostate cancer ([place]) 12/21/12   Gleason 3+4=7  . Wears glasses     Past Surgical History:  Procedure Laterality Date  . KNEE ARTHROSCOPY W/ MENISCAL REPAIR Left JAN 2014  . LOOP RECORDER INSERTION N/A 01/05/2018   Procedure: LOOP RECORDER INSERTION;  Surgeon: [name], MD;  Location: [place] [facility];  Service: Cardiovascular;  Laterality: N/A;  . LUMBAR SPINE SURGERY  1991  . PROSTATE BIOPSY N/A 12/21/2012   Procedure: BIOPSY TRANSRECTAL ULTRASONIC PROSTATE (TUBP);  Surgeon: [name], MD;  [facility]: [facility];  Service: Urology;  Laterality: N/A;  . PROSTATE BIOPSY  11/26/11   benign     Social History Social History   Tobacco Use  . Smoking status: Former Smoker    Packs/day: 1.00    Years: 30.00      Pack years: 30.00    Types: Cigarettes    Last attempt to quit: 12/15/1997    Years since quitting: 20.3  . Smokeless tobacco: Never Used  Substance Use Topics  . Alcohol use: Yes    Alcohol/week: 20.0 standard drinks    Types: 20 Shots of liquor per week    Comment: OCCASIONAL, 12/28/17 stopped 11/05/17  . Drug use: No    Family History Family History  Problem Relation Age of Onset  . Heart disease Mother   . Heart disease Father   . Cancer Sister        lung  . Cancer Brother        prostate    Allergies  Allergies  Allergen Reactions  . Statins Other (See Comments)    Joint pain     Current Outpatient Medications  Medication Sig Dispense Refill  . acetaminophen (TYLENOL) 650 MG CR tablet Take 650 mg by mouth every 8 (eight) hours.    Marland Kitchen allopurinol (ZYLOPRIM) 300 MG tablet Take 300 mg by mouth daily.     Marland Kitchen amLODipine (NORVASC) 5 MG tablet Take 5 mg by mouth daily.  1  . colchicine 0.6 MG tablet Take 0.6 mg by mouth 2 (two) times daily as needed (for gout).     . Naphazoline HCl (CLEAR EYES OP)  Place 1 drop into both eyes daily as needed (dry eyes).     Marland Kitchen omeprazole (PRILOSEC) 20 MG capsule Take 20 mg by mouth daily.    Marland Kitchen REPATHA SURECLICK 196 MG/ML SOAJ Inject 140 mg into the skin every 14 (fourteen) days.     Marland Kitchen tolnaftate (TINACTIN) 1 % cream Apply 1 application topically daily as needed (jock itch).    . valsartan (DIOVAN) 320 MG tablet Take 320 mg by mouth daily.  0   No current facility-administered medications for this visit.     ROS:   General:  No weight loss, Fever, chills  HEENT: No recent headaches, no nasal bleeding, no visual changes, no sore throat  Neurologic: No dizziness, blackouts, seizures. No recent symptoms of stroke or mini- stroke. No recent episodes of slurred speech, or temporary blindness.  Cardiac: No recent episodes of chest pain/pressure, no shortness of breath at rest.  No shortness of breath with exertion.  Denies history of  atrial fibrillation or irregular heartbeat  Vascular: No history of rest pain in feet.  No history of claudication.  No history of non-healing ulcer, No history of DVT   Pulmonary: No home oxygen, no productive cough, no hemoptysis,  No asthma or wheezing  Musculoskeletal:  [ ]  Arthritis, [ ]  Low back pain,  [ ]  Joint pain  Hematologic:No history of hypercoagulable state.  No history of easy bleeding.  No history of anemia  Gastrointestinal: No hematochezia or melena,  No gastroesophageal reflux, no trouble swallowing  Urinary: [ ]  chronic Kidney disease, [ ]  on HD - [ ]  MWF or [ ]  TTHS, [ ]  Burning with urination, [ ]  Frequent urination, [ ]  Difficulty urinating;   Skin: No rashes  Psychological: No history of anxiety,  No history of depression   Physical Examination  Vitals:   04/01/18 1045 04/01/18 1048  BP: (!) 162/80 (!) 167/85  Pulse: (!) 57   Resp: 20   Temp: 98 F (36.7 C)   Weight: 209 lb (94.8 kg)   Height: 6\' 3"  (1.905 m)     Body mass index is 26.12 kg/m.  General:  Alert and oriented, no acute distress HEENT: Normal, normocephalic Neck: No bruit or JVD Pulmonary: Clear to auscultation bilaterally Cardiac: Regular Rate and Rhythm without murmur Gastrointestinal: Soft, non-tender, non-distended, no mass, no scars Skin: No rash Extremity Pulses:  2+ radial, brachial, femoral, dorsalis pedis, posterior tibial pulses bilaterally Musculoskeletal: No deformity or edema  Neurologic: Upper and lower extremity motor 5/5 and symmetric  DATA:    Right Carotid Findings: +----------+--------+--------+--------+------------+--------+           PSV cm/sEDV cm/sStenosisDescribe    Comments +----------+--------+--------+--------+------------+--------+ CCA Prox  131     15              heterogenous         +----------+--------+--------+--------+------------+--------+ CCA Mid   97      16              heterogenous          +----------+--------+--------+--------+------------+--------+ CCA Distal102     19              heterogenous         +----------+--------+--------+--------+------------+--------+ ICA Prox  120     27      1-39%   heterogenous         +----------+--------+--------+--------+------------+--------+ ICA Mid   99      17                                   +----------+--------+--------+--------+------------+--------+  ICA Distal95      19                                   +----------+--------+--------+--------+------------+--------+ ECA       110     14              heterogenous         +----------+--------+--------+--------+------------+--------+  +----------+--------+-------+----------------+-------------------+           PSV cm/sEDV cmsDescribe        Arm Pressure (mmHG) +----------+--------+-------+----------------+-------------------+ [id number]            Multiphasic, WNL                    +----------+--------+-------+----------------+-------------------+  +---------+--------+--+--------+--+---------+ VertebralPSV cm/s68EDV cm/s17Antegrade +---------+--------+--+--------+--+---------+    Left Carotid Findings: +----------+--------+--------+--------+------------+--------+           PSV cm/sEDV cm/sStenosisDescribe    Comments +----------+--------+--------+--------+------------+--------+ CCA Prox  118     20                                   +----------+--------+--------+--------+------------+--------+ CCA Mid   89      20              heterogenous         +----------+--------+--------+--------+------------+--------+ CCA Distal89      19              heterogenous         +----------+--------+--------+--------+------------+--------+ ICA Prox  153     41      40-59%  heterogenous         +----------+--------+--------+--------+------------+--------+ ICA Mid   111     30                                    +----------+--------+--------+--------+------------+--------+ ICA Distal117     29                                   +----------+--------+--------+--------+------------+--------+ ECA       128     16                                   +----------+--------+--------+--------+------------+--------+  +----------+--------+--------+--------+-------------------+ SubclavianPSV cm/sEDV cm/sDescribeArm Pressure (mmHG) +----------+--------+--------+--------+-------------------+           108             dampened                    +----------+--------+--------+--------+-------------------+  +---------+--------+--+--------+--+---------+ VertebralPSV cm/s52EDV cm/s15Antegrade +---------+--------+--+--------+--+---------+    Summary: Right Carotid: Velocities in the right ICA are consistent with a 1-39% stenosis.  Left Carotid: Velocities in the left ICA are consistent with a 40-59% stenosis.  Vertebrals:  Bilateral vertebral arteries demonstrate antegrade flow. Subclavians: Normal flow hemodynamics were seen in the right subclavian artery.              Dampened left.   ASSESSMENT:  Asymptomatic carotid stenosis   PLAN: He is very active and has not had symptoms or stroke or TIA.  His follow up carotid duplex is essentially the same.  We  reviewed the symptoms of stroke today and schedule him for a f/u carotid study in 6 months.  If he problems or concerns he will call sooner.   Roxy Horseman PA-C [facility] Office: [phone]  MD in clinic:[place]

## 2018-04-01 ENCOUNTER — Ambulatory Visit (HOSPITAL_COMMUNITY)
Admission: RE | Admit: 2018-04-01 | Discharge: 2018-04-01 | Disposition: A | Discharge status: Home / Self Care | Payer: Medicare Other | Source: Ambulatory Visit | Attending: Family | Admitting: Family

## 2018-04-01 ENCOUNTER — Ambulatory Visit: Payer: Medicare Other | Admitting: Physician Assistant

## 2018-04-01 ENCOUNTER — Other Ambulatory Visit: Payer: Self-pay

## 2018-04-01 VITALS — BP 167/85 | HR 57 | Temp 98.0°F | Resp 20 | Ht 75.0 in | Wt 209.0 lb

## 2018-04-01 DIAGNOSIS — I6523 Occlusion and stenosis of bilateral carotid arteries: Secondary | ICD-10-CM

## 2018-04-13 ENCOUNTER — Telehealth: Payer: Self-pay | Admitting: Medical Oncology

## 2018-04-13 NOTE — Telephone Encounter (Signed)
Needs earlier appt ono may 7th or day before. Schedule message sent.

## 2018-04-14 ENCOUNTER — Ambulatory Visit (INDEPENDENT_AMBULATORY_CARE_PROVIDER_SITE_OTHER): Payer: Medicare Other

## 2018-04-14 DIAGNOSIS — R55 Syncope and collapse: Secondary | ICD-10-CM

## 2018-04-15 ENCOUNTER — Ambulatory Visit (INDEPENDENT_AMBULATORY_CARE_PROVIDER_SITE_OTHER): Payer: Medicare Other | Admitting: Internal Medicine

## 2018-04-15 ENCOUNTER — Encounter: Payer: Self-pay | Admitting: Internal Medicine

## 2018-04-15 VITALS — BP 144/76 | HR 66 | Ht 75.0 in | Wt 212.8 lb

## 2018-04-15 DIAGNOSIS — R55 Syncope and collapse: Secondary | ICD-10-CM | POA: Diagnosis not present

## 2018-04-15 DIAGNOSIS — I6522 Occlusion and stenosis of left carotid artery: Secondary | ICD-10-CM

## 2018-04-15 DIAGNOSIS — I6523 Occlusion and stenosis of bilateral carotid arteries: Secondary | ICD-10-CM

## 2018-04-15 NOTE — Progress Notes (Signed)
Patient Care Team: [name], MD as PCP - General (Internal Medicine) Josue Hector, MD as PCP - Cardiology (Cardiology)   HPI  Ricky Edinger Sr. is a 73 y.o. male Seen in followup with ILR implanted for recurrent presyncope and syncope with two distinct syndromes  No interval episodes of any kind  The patient denies chest pain, shortness of breath, nocturnal dyspnea, orthopnea or peripheral edema.  There have been no palpitations, lightheadedness or syncope.     Records and Results Reviewed   Past Medical History:  Diagnosis Date  . Borderline diabetes    DIET CONTROLLED  . Dyslipidemia   . Elevated PSA   . External carotid artery stenosis    MILD-- BILATERAL  . Fall 11/05/2017  . GERD (gastroesophageal reflux disease)   . Gout, arthritis   . Hearing loss in left ear   . Heart murmur    MILD --  ASYMPTOMATIC  . History of gastric ulcer    REMOTE HX  . Hypertension   . Pre-diabetes   . Prostate cancer ([place]) 12/21/12   Gleason 3+4=7  . Wears glasses     Past Surgical History:  Procedure Laterality Date  . KNEE ARTHROSCOPY W/ MENISCAL REPAIR Left JAN 2014  . LOOP RECORDER INSERTION N/A 01/05/2018   Procedure: LOOP RECORDER INSERTION;  Surgeon: [name], MD;  Location: [place] [facility];  Service: Cardiovascular;  Laterality: N/A;  . LUMBAR SPINE SURGERY  1991  . PROSTATE BIOPSY N/A 12/21/2012   Procedure: BIOPSY TRANSRECTAL ULTRASONIC PROSTATE (TUBP);  Surgeon: [name], MD;  [facility];  Service: Urology;  Laterality: N/A;  . PROSTATE BIOPSY  11/26/11   benign    Current Meds  Medication Sig  . acetaminophen (TYLENOL) 650 MG CR tablet Take 650 mg by mouth every 8 (eight) hours.  Marland Kitchen allopurinol (ZYLOPRIM) 300 MG tablet Take 300 mg by mouth daily.   Marland Kitchen amLODipine (NORVASC) 5 MG tablet Take 5 mg by mouth daily.  . colchicine 0.6 MG tablet Take 0.6 mg by mouth 2 (two) times daily as needed (for gout).   .  Naphazoline HCl (CLEAR EYES OP) Place 1 drop into both eyes daily as needed (dry eyes).   Marland Kitchen omeprazole (PRILOSEC) 20 MG capsule Take 20 mg by mouth daily.  Marland Kitchen REPATHA SURECLICK 314 MG/ML SOAJ Inject 140 mg into the skin every 14 (fourteen) days.   Marland Kitchen tolnaftate (TINACTIN) 1 % cream Apply 1 application topically daily as needed (jock itch).  . valsartan (DIOVAN) 320 MG tablet Take 320 mg by mouth daily.    Allergies  Allergen Reactions  . Statins Other (See Comments)    Joint pain      Review of Systems negative except from HPI and PMH  Physical Exam BP (!) 144/76   Pulse 66   Ht 6\' 3"  (1.905 m)   Wt 212 lb 12.8 oz (96.5 kg)   SpO2 98%   BMI 26.60 kg/m  Well developed and well nourished in no acute distress HENT normal Neck supple with JVP-flat Clear Device pocket well healed; without hematoma or erythema.  There is no tethering  Regular rate and rhythm, no  murmur Abd-soft with active BS No Clubbing cyanosis   edema Skin-warm and dry A & Oriented  Grossly normal sensory and motor function  ECG  Sinus @ 66   Assessment and  Plan  Presyncope  Implantable loop recorder   No interval event  Will schedule one yr followup      Current medicines are reviewed at length with the patient today .  The patient does not have concerns regarding medicines.

## 2018-04-15 NOTE — Patient Instructions (Signed)
Medication Instructions:  Your physician recommends that you continue on your current medications as directed. Please refer to the Current Medication list given to you today.   Labwork: None ordered.  Testing/Procedures: None ordered.  Follow-Up: Your physician recommends that you schedule a follow-up appointment  As needed with Dr Caryl Comes  Any Other Special Instructions Will Be Listed Below (If Applicable).     If you need a refill on your cardiac medications before your next appointment, please call your pharmacy.

## 2018-04-16 LAB — CUP PACEART REMOTE DEVICE CHECK
Date Time Interrogation Session: 20200205134009
MDC IDC PG IMPLANT DT: 20191029

## 2018-04-16 LAB — CUP PACEART INCLINIC DEVICE CHECK
Date Time Interrogation Session: 20200206201907
MDC IDC PG IMPLANT DT: 20191029

## 2018-04-23 NOTE — Progress Notes (Signed)
[facility] Summary Report / Loop Recorder 

## 2018-05-17 ENCOUNTER — Ambulatory Visit (INDEPENDENT_AMBULATORY_CARE_PROVIDER_SITE_OTHER): Payer: Medicare Other | Admitting: *Deleted

## 2018-05-17 DIAGNOSIS — R55 Syncope and collapse: Secondary | ICD-10-CM

## 2018-05-18 LAB — CUP PACEART REMOTE DEVICE CHECK
MDC IDC PG IMPLANT DT: 20191029
MDC IDC SESS DTM: 20200309140830

## 2018-05-25 NOTE — Progress Notes (Signed)
[facility] Summary Report / Loop Recorder 

## 2018-06-21 ENCOUNTER — Other Ambulatory Visit: Payer: Self-pay

## 2018-06-21 ENCOUNTER — Ambulatory Visit (INDEPENDENT_AMBULATORY_CARE_PROVIDER_SITE_OTHER): Payer: Medicare Other | Admitting: *Deleted

## 2018-06-21 DIAGNOSIS — R55 Syncope and collapse: Secondary | ICD-10-CM

## 2018-06-21 LAB — CUP PACEART REMOTE DEVICE CHECK
Date Time Interrogation Session: 20200411143655
Implantable Pulse Generator Implant Date: 20191029

## 2018-07-01 NOTE — Progress Notes (Signed)
[facility] Summary Report / Loop Recorder 

## 2018-07-08 ENCOUNTER — Other Ambulatory Visit: Payer: Self-pay

## 2018-07-08 ENCOUNTER — Inpatient Hospital Stay: Payer: Medicare Other | Attending: Internal Medicine

## 2018-07-08 DIAGNOSIS — D472 Monoclonal gammopathy: Secondary | ICD-10-CM | POA: Diagnosis not present

## 2018-07-08 DIAGNOSIS — I6522 Occlusion and stenosis of left carotid artery: Secondary | ICD-10-CM

## 2018-07-08 DIAGNOSIS — Z8546 Personal history of malignant neoplasm of prostate: Secondary | ICD-10-CM | POA: Diagnosis not present

## 2018-07-08 LAB — CMP (CANCER CENTER ONLY)
ALT: 15 U/L (ref 0–44)
AST: 24 U/L (ref 15–41)
Albumin: 3.9 g/dL (ref 3.5–5.0)
Alkaline Phosphatase: 71 U/L (ref 38–126)
Anion gap: 11 (ref 5–15)
BUN: 19 mg/dL (ref 8–23)
CO2: 24 mmol/L (ref 22–32)
Calcium: 9.4 mg/dL (ref 8.9–10.3)
Chloride: 106 mmol/L (ref 98–111)
Creatinine: 1.27 mg/dL — ABNORMAL HIGH (ref 0.61–1.24)
GFR, Est AFR Am: >60 mL/min (ref >60)
GFR, Estimated: 55 mL/min — ABNORMAL LOW (ref >60)
Glucose, Bld: 94 mg/dL (ref 70–99)
Potassium: 4.3 mmol/L (ref 3.5–5.1)
Sodium: 141 mmol/L (ref 135–145)
Total Bilirubin: 0.5 mg/dL (ref 0.3–1.2)
Total Protein: 7.6 g/dL (ref 6.5–8.1)

## 2018-07-08 LAB — CBC WITH DIFFERENTIAL (CANCER CENTER ONLY)
Abs Immature Granulocytes: 0.02 10*3/uL (ref 0.00–0.07)
Basophils Absolute: 0 10*3/uL (ref 0.0–0.1)
Basophils Relative: 0 %
Eosinophils Absolute: 0.1 10*3/uL (ref 0.0–0.5)
Eosinophils Relative: 2 %
HCT: 44.4 % (ref 39.0–52.0)
Hemoglobin: 15 g/dL (ref 13.0–17.0)
Immature Granulocytes: 0 %
Lymphocytes Relative: 26 %
Lymphs Abs: 2.1 10*3/uL (ref 0.7–4.0)
MCH: 33.3 pg (ref 26.0–34.0)
MCHC: 33.8 g/dL (ref 30.0–36.0)
MCV: 98.4 fL (ref 80.0–100.0)
Monocytes Absolute: 0.7 10*3/uL (ref 0.1–1.0)
Monocytes Relative: 9 %
Neutro Abs: 5.1 10*3/uL (ref 1.7–7.7)
Neutrophils Relative %: 63 %
Platelet Count: 154 10*3/uL (ref 150–400)
RBC: 4.51 MIL/uL (ref 4.22–5.81)
RDW: 13.1 % (ref 11.5–15.5)
WBC Count: 8.1 10*3/uL (ref 4.0–10.5)
nRBC: 0 % (ref 0.0–0.2)

## 2018-07-08 LAB — LACTATE DEHYDROGENASE: LDH: 208 U/L — ABNORMAL HIGH (ref 98–192)

## 2018-07-09 LAB — IGG, IGA, IGM
IgA: 360 mg/dL (ref 61–437)
IgG (Immunoglobin G), Serum: 1027 mg/dL (ref 603–1613)
IgM (Immunoglobulin M), Srm: 92 mg/dL (ref 15–143)

## 2018-07-09 LAB — BETA 2 MICROGLOBULIN, SERUM: Beta-2 Microglobulin: 2.1 mg/L (ref 0.6–2.4)

## 2018-07-09 LAB — PROSTATE-SPECIFIC AG, SERUM (LABCORP): Prostate Specific Ag, Serum: 0.2 ng/mL (ref 0.0–4.0)

## 2018-07-09 LAB — KAPPA/LAMBDA LIGHT CHAINS
Kappa free light chain: 58.4 mg/L — ABNORMAL HIGH (ref 3.3–19.4)
Kappa, lambda light chain ratio: 1.85 — ABNORMAL HIGH (ref 0.26–1.65)
Lambda free light chains: 31.5 mg/L — ABNORMAL HIGH (ref 5.7–26.3)

## 2018-07-15 ENCOUNTER — Encounter: Payer: Self-pay | Admitting: Internal Medicine

## 2018-07-15 ENCOUNTER — Inpatient Hospital Stay: Payer: Medicare Other | Attending: Internal Medicine | Admitting: Internal Medicine

## 2018-07-15 ENCOUNTER — Other Ambulatory Visit: Payer: Self-pay

## 2018-07-15 VITALS — BP 132/63 | HR 61 | Temp 98.0°F | Resp 16 | Wt 211.5 lb

## 2018-07-15 DIAGNOSIS — M25562 Pain in left knee: Secondary | ICD-10-CM | POA: Diagnosis not present

## 2018-07-15 DIAGNOSIS — D472 Monoclonal gammopathy: Secondary | ICD-10-CM | POA: Diagnosis present

## 2018-07-15 DIAGNOSIS — M25561 Pain in right knee: Secondary | ICD-10-CM

## 2018-07-15 DIAGNOSIS — E785 Hyperlipidemia, unspecified: Secondary | ICD-10-CM | POA: Diagnosis not present

## 2018-07-15 DIAGNOSIS — Z79899 Other long term (current) drug therapy: Secondary | ICD-10-CM

## 2018-07-15 DIAGNOSIS — M1990 Unspecified osteoarthritis, unspecified site: Secondary | ICD-10-CM | POA: Diagnosis not present

## 2018-07-15 DIAGNOSIS — K219 Gastro-esophageal reflux disease without esophagitis: Secondary | ICD-10-CM | POA: Insufficient documentation

## 2018-07-15 DIAGNOSIS — M109 Gout, unspecified: Secondary | ICD-10-CM | POA: Diagnosis not present

## 2018-07-15 DIAGNOSIS — Z8546 Personal history of malignant neoplasm of prostate: Secondary | ICD-10-CM | POA: Diagnosis not present

## 2018-07-15 DIAGNOSIS — I10 Essential (primary) hypertension: Secondary | ICD-10-CM

## 2018-07-15 NOTE — Progress Notes (Signed)
[facility] Telephone:([phone]) [phone]   Fax:([phone]) [phone] OFFICE PROGRESS NOTE  [name], MD [address] [place] [address]  DIAGNOSIS:  1) Monoclonal gammopathy of undetermined significance (MGUS). 2) History of prostate cancer T1C followed by Dr. [name],  status post curative radiotherapy under the care of Dr. [name].  PRIOR THERAPY: Curative radiotherapy to the prostate under the care of Dr. [name].  CURRENT THERAPY: Observation.  INTERVAL HISTORY: Ricky Rowland Sr. 73 y.o. male returns to the clinic today for follow-up visit.  The patient is feeling fine today with no concerning complaints.  He denied having any chest pain, shortness of breath, cough or hemoptysis.  He denied having any fever or chills.  He has no nausea, vomiting, diarrhea or constipation.  He denied having any fever or chills.  He continues to complain of arthralgia of his knees.  The patient had repeat myeloma panel performed recently and is here for evaluation and discussion of his lab results.  MEDICAL HISTORY: Past Medical History:  Diagnosis Date  . Borderline diabetes    DIET CONTROLLED  . Dyslipidemia   . Elevated PSA   . External carotid artery stenosis    MILD-- BILATERAL  . Fall 11/05/2017  . GERD (gastroesophageal reflux disease)   . Gout, arthritis   . Hearing loss in left ear   . Heart murmur    MILD --  ASYMPTOMATIC  . History of gastric ulcer    REMOTE HX  . Hypertension   . Pre-diabetes   . Prostate cancer ([place]) 12/21/12   Gleason 3+4=7  . Wears glasses     ALLERGIES:  is allergic to statins.  MEDICATIONS:  Current Outpatient Medications  Medication Sig Dispense Refill  . acetaminophen (TYLENOL) 650 MG CR tablet Take 650 mg by mouth every 8 (eight) hours.    Marland Kitchen allopurinol (ZYLOPRIM) 300 MG tablet Take 300 mg by mouth daily.     Marland Kitchen amLODipine (NORVASC) 5 MG tablet Take 5 mg by mouth daily.  1  . colchicine 0.6 MG tablet Take 0.6 mg  by mouth 2 (two) times daily as needed (for gout).     . Naphazoline HCl (CLEAR EYES OP) Place 1 drop into both eyes daily as needed (dry eyes).     Marland Kitchen omeprazole (PRILOSEC) 20 MG capsule Take 20 mg by mouth daily.    Marland Kitchen REPATHA SURECLICK 937 MG/ML SOAJ Inject 140 mg into the skin every 14 (fourteen) days.     Marland Kitchen tolnaftate (TINACTIN) 1 % cream Apply 1 application topically daily as needed (jock itch).    . valsartan (DIOVAN) 320 MG tablet Take 320 mg by mouth daily.  0   No current facility-administered medications for this visit.     SURGICAL HISTORY:  Past Surgical History:  Procedure Laterality Date  . KNEE ARTHROSCOPY W/ MENISCAL REPAIR Left JAN 2014  . LOOP RECORDER INSERTION N/A 01/05/2018   Procedure: LOOP RECORDER INSERTION;  Surgeon: [name], MD;  Location: [facility];  Service: Cardiovascular;  Laterality: N/A;  . LUMBAR SPINE SURGERY  1991  . PROSTATE BIOPSY N/A 12/21/2012   Procedure: BIOPSY TRANSRECTAL ULTRASONIC PROSTATE (TUBP);  Surgeon: [name], MD;  Location: [facility];  Service: Urology;  Laterality: N/A;  . PROSTATE BIOPSY  11/26/11   benign    REVIEW OF SYSTEMS:  A comprehensive review of systems was negative except for: Musculoskeletal: positive for arthralgias   PHYSICAL EXAMINATION:  General appearance: alert, cooperative and no distress Head: Normocephalic, without obvious abnormality, atraumatic Neck: no adenopathy, no JVD, supple, symmetrical, trachea midline and thyroid not enlarged, symmetric, no tenderness/mass/nodules Lymph nodes: Cervical, supraclavicular, and axillary nodes normal. Resp: clear to auscultation bilaterally Back: symmetric, no curvature. ROM normal. No CVA tenderness. Cardio: regular rate and rhythm, S1, S2 normal, no murmur, click, rub or gallop GI: soft, non-tender; bowel sounds normal; no masses,  no organomegaly Extremities: extremities normal, atraumatic, no cyanosis or edema  ECOG PERFORMANCE  STATUS: 1 - Symptomatic but completely ambulatory  Blood pressure 132/63, pulse 61, temperature 98 F (36.7 C), resp. rate 16, weight 211 lb 8 oz (95.9 kg), SpO2 100 %.  LABORATORY DATA: Lab Results  Component Value Date   WBC 8.1 07/08/2018   HGB 15.0 07/08/2018   HCT 44.4 07/08/2018   MCV 98.4 07/08/2018   PLT 154 07/08/2018      Chemistry      Component Value Date/Time   NA 141 07/08/2018 1052   NA 140 06/18/2016 0857   K 4.3 07/08/2018 1052   K 4.2 06/18/2016 0857   CL 106 07/08/2018 1052   CO2 24 07/08/2018 1052   CO2 25 06/18/2016 0857   BUN 19 07/08/2018 1052   BUN 14.7 06/18/2016 0857   CREATININE 1.27 (H) 07/08/2018 1052   CREATININE 1.1 06/18/2016 0857      Component Value Date/Time   CALCIUM 9.4 07/08/2018 1052   CALCIUM 10.1 06/18/2016 0857   ALKPHOS 71 07/08/2018 1052   ALKPHOS 79 06/18/2016 0857   AST 24 07/08/2018 1052   AST 43 (H) 06/18/2016 0857   ALT 15 07/08/2018 1052   ALT 41 06/18/2016 0857   BILITOT 0.5 07/08/2018 1052   BILITOT 0.73 06/18/2016 0857     Oher lab results LTS FINAL DIAGNOSIS ASSESSMENT AND PLAN:  This is a very pleasant 74 years old African-American male with monoclonal gammopathy of undetermined significance and has been observation for several years.  The patient continues to do well with no concerning complaints except for her arthritis. He had repeat myeloma panel performed recently.  I discussed the lab results with the patient.  There is no concerning findings for disease progression. I recommended for him to continue on observation with repeat myeloma panel in 1 year. For the history of prostate cancer, his PSA is still within the normal range.  He is currently on observation. The patient was advised to call immediately if he has any concerning symptoms in the interval. The patient voices understanding of current disease status and treatment options and is in agreement with the current care plan. All questions were  answered. The patient knows to call the clinic with any problems, questions or concerns. We can certainly see the patient much sooner if necessary. I spent 10 minutes counseling the patient face to face. The total time spent in the appointment was 15 minutes.  Disclaimer: This note was dictated with voice recognition software. Similar sounding words can inadvertently be transcribed and may not be corrected upon review.

## 2018-07-16 ENCOUNTER — Telehealth: Payer: Self-pay | Admitting: Internal Medicine

## 2018-07-16 NOTE — Telephone Encounter (Signed)
Scheduled appt per 5/07 los - pt to get an updated schedule mailed. F/u in one year.

## 2018-07-22 ENCOUNTER — Ambulatory Visit (INDEPENDENT_AMBULATORY_CARE_PROVIDER_SITE_OTHER): Payer: Medicare Other | Admitting: *Deleted

## 2018-07-22 ENCOUNTER — Other Ambulatory Visit: Payer: Self-pay

## 2018-07-22 DIAGNOSIS — R55 Syncope and collapse: Secondary | ICD-10-CM | POA: Diagnosis not present

## 2018-07-22 LAB — CUP PACEART REMOTE DEVICE CHECK
Date Time Interrogation Session: 20200514174313
Implantable Pulse Generator Implant Date: 20191029

## 2018-07-29 NOTE — Progress Notes (Signed)
[facility] Summary Report / Loop Recorder 

## 2018-08-24 ENCOUNTER — Ambulatory Visit (INDEPENDENT_AMBULATORY_CARE_PROVIDER_SITE_OTHER): Payer: Medicare Other | Admitting: *Deleted

## 2018-08-24 ENCOUNTER — Telehealth: Payer: Self-pay | Admitting: Internal Medicine

## 2018-08-24 DIAGNOSIS — R55 Syncope and collapse: Secondary | ICD-10-CM | POA: Diagnosis not present

## 2018-08-24 LAB — CUP PACEART REMOTE DEVICE CHECK
Date Time Interrogation Session: 20200616180623
Implantable Pulse Generator Implant Date: 20191029

## 2018-08-24 NOTE — Telephone Encounter (Signed)
New Message    1. Has your device fired? no  2. Is you device beeping? no  3. Are you experiencing draining or swelling at device site? no  4. Are you calling to see if we received your device transmission? no  5. Have you passed out? No   Patient is calling because his transmission unit is blinking and has been blinking for at least 3 days    Please route to [place]

## 2018-08-24 NOTE — Telephone Encounter (Signed)
Spoke w/ pt and instructed him to unplug his monitor count to 10 and plug the monitor back up. We sent a manual transmission. I instructed him how to send a manual transmission. Transmission received. I informed pt that resetting the monitor should have fixed the problem with the light. Pt verbalized understanding.

## 2018-09-04 NOTE — Progress Notes (Signed)
[facility] Summary Report / Loop Recorder 

## 2018-09-21 ENCOUNTER — Other Ambulatory Visit: Payer: Self-pay

## 2018-09-21 DIAGNOSIS — I6523 Occlusion and stenosis of bilateral carotid arteries: Secondary | ICD-10-CM

## 2018-09-27 ENCOUNTER — Ambulatory Visit (INDEPENDENT_AMBULATORY_CARE_PROVIDER_SITE_OTHER): Payer: Medicare Other | Admitting: *Deleted

## 2018-09-27 DIAGNOSIS — R55 Syncope and collapse: Secondary | ICD-10-CM

## 2018-09-27 LAB — CUP PACEART REMOTE DEVICE CHECK
Date Time Interrogation Session: 20200719184112
Implantable Pulse Generator Implant Date: 20191029

## 2018-09-29 ENCOUNTER — Telehealth (HOSPITAL_COMMUNITY): Payer: Self-pay

## 2018-09-29 NOTE — Telephone Encounter (Signed)
The above patient or their representative was contacted and gave the following answers to these questions:         Do you have any of the following symptoms?No  Fever                    Cough                   Shortness of breath  Do  you have any of the following other symptoms?    muscle pain         vomiting,        diarrhea        rash         weakness        red eye        abdominal pain         bruising          bruising or bleeding              joint pain           severe headache    Have you been in contact with someone who was or has been sick in the past 2 weeks? NO  Yes                 Unsure                         Unable to assess   Does the person that you were in contact with have any of the following symptoms?   Cough         shortness of breath           muscle pain         vomiting,            diarrhea            rash            weakness           fever            red eye           abdominal pain           bruising  or  bleeding                joint pain                severe headache

## 2018-09-30 ENCOUNTER — Ambulatory Visit: Payer: Medicare Other | Admitting: Family

## 2018-09-30 ENCOUNTER — Ambulatory Visit (HOSPITAL_COMMUNITY)
Admission: RE | Admit: 2018-09-30 | Discharge: 2018-09-30 | Disposition: A | Discharge status: Home / Self Care | Payer: Medicare Other | Source: Ambulatory Visit | Attending: Family | Admitting: Family

## 2018-09-30 ENCOUNTER — Encounter: Payer: Self-pay | Admitting: Family

## 2018-09-30 ENCOUNTER — Other Ambulatory Visit: Payer: Self-pay

## 2018-09-30 VITALS — BP 148/77 | HR 61 | Temp 97.9°F | Resp 20 | Ht 75.0 in | Wt 205.8 lb

## 2018-09-30 DIAGNOSIS — I6523 Occlusion and stenosis of bilateral carotid arteries: Secondary | ICD-10-CM

## 2018-09-30 NOTE — Progress Notes (Signed)
Chief Complaint: Follow up Extracranial Carotid Artery Stenosis   History of Present Illness  Ricky Rowland. is a 73 y.o. male who presents for evaluation of carotid stenosis.  11/06/2017 ultrasound studies showed less than 50% right carotid stenosis and 50-69% left carotid stenosiswhen he was admitted to [facility] with an episode of blacking out.  He was kept overnight for monitoring and telemetry did not reportedly reveal any arrhythmias. He was only mildly orthostatic. Echocardiogram showed normal LV systolic function EF 81-19%, mild LVH, no significant valvular disease.  He is followed by Dr. Johnsie Cancel.  There is also a mention of chronic dizziness.   Dr.Brabham and M. Collins PA-C have evaluated pt in the past.                  He denies any known history of stroke or TIA. Specifically he denies a history of amaurosis fugax or monocular blindness, unilateral facial drooping, hemiplegia, or receptive or expressive aphasia.    He states that his prostate cancer is in remission.   Diabetic: no Tobacco use: former smoker, quit in 1999, smoked x 30 years  Pt meds include: Statin : no, statin caused joint pain, takes Repatha  ASA: no Other anticoagulants/antiplatelets: no   Past Medical History:  Diagnosis Date  . Borderline diabetes    DIET CONTROLLED  . Dyslipidemia   . Elevated PSA   . External carotid artery stenosis    MILD-- BILATERAL  . Fall 11/05/2017  . GERD (gastroesophageal reflux disease)   . Gout, arthritis   . Hearing loss in left ear   . Heart murmur    MILD --  ASYMPTOMATIC  . History of gastric ulcer    REMOTE HX  . Hypertension   . Pre-diabetes   . Prostate cancer ([place]) 12/21/12   Gleason 3+4=7  . Wears glasses     Social History Social History   Tobacco Use  . Smoking status: Former Smoker    Packs/day: 1.00    Years: 30.00    Pack years: 30.00    Types: Cigarettes    Quit date: 12/15/1997    Years since quitting: 20.8  . Smokeless  tobacco: Never Used  Substance Use Topics  . Alcohol use: Yes    Alcohol/week: 20.0 standard drinks    Types: 20 Shots of liquor per week    Comment: OCCASIONAL, 12/28/17 stopped 11/05/17  . Drug use: No    Family History Family History  Problem Relation Age of Onset  . Heart disease Mother   . Heart disease Father   . Cancer Sister        lung  . Cancer Brother        prostate    Surgical History Past Surgical History:  Procedure Laterality Date  . KNEE ARTHROSCOPY W/ MENISCAL REPAIR Left JAN 2014  . LOOP RECORDER INSERTION N/A 01/05/2018   Procedure: LOOP RECORDER INSERTION;  Surgeon: [name], MD;  Location: [facility];  Service: Cardiovascular;  Laterality: N/A;  . LUMBAR SPINE SURGERY  1991  . PROSTATE BIOPSY N/A 12/21/2012   Procedure: BIOPSY TRANSRECTAL ULTRASONIC PROSTATE (TUBP);  Surgeon: [name], MD;  Location: [facility];  Service: Urology;  Laterality: N/A;  . PROSTATE BIOPSY  11/26/11   benign    Allergies  Allergen Reactions  . Statins Other (See Comments)    Joint pain    Current Outpatient Medications  Medication Sig Dispense Refill  . acetaminophen (TYLENOL)  650 MG CR tablet Take 650 mg by mouth every 8 (eight) hours.    Marland Kitchen allopurinol (ZYLOPRIM) 300 MG tablet Take 300 mg by mouth daily.     Marland Kitchen amLODipine (NORVASC) 5 MG tablet Take 5 mg by mouth daily.  1  . colchicine 0.6 MG tablet Take 0.6 mg by mouth 2 (two) times daily as needed (for gout).     . Naphazoline HCl (CLEAR EYES OP) Place 1 drop into both eyes daily as needed (dry eyes).     Marland Kitchen omeprazole (PRILOSEC) 20 MG capsule Take 20 mg by mouth daily.    Marland Kitchen REPATHA SURECLICK 606 MG/ML SOAJ Inject 140 mg into the skin every 14 (fourteen) days.     Marland Kitchen tolnaftate (TINACTIN) 1 % cream Apply 1 application topically daily as needed (jock itch).    . valsartan (DIOVAN) 320 MG tablet Take 320 mg by mouth daily.  0   No current facility-administered medications for this  visit.     Review of Systems : See HPI for pertinent positives and negatives.  Physical Examination  Vitals:   09/30/18 1129 09/30/18 1131  BP: (!) 145/78 (!) 148/77  Pulse: 61   Resp: 20   Temp: 97.9 F (36.6 C)   SpO2: 99%   Weight: 205 lb 12.8 oz (93.4 kg)   Height: 6\' 3"  (1.905 m)    Body mass index is 25.72 kg/m.  General: WDWN male in NAD GAIT: normal Eyes: PERRLA HENT: No gross abnormalities.  Pulmonary:  Respirations are non-labored, good air movement in all fields, CTAB, no rales,  rhonchi, or wheezing. Cardiac: regular rhythm, no detected murmur.  VASCULAR EXAM Carotid Bruits Right Left   Negative Negative     Abdominal aortic pulse is not palpable. Radial pulses are 2+ palpable and equal.                                                                                                                            LE Pulses Right Left       POPLITEAL  not palpable   not palpable       POSTERIOR TIBIAL   palpable    palpable        DORSALIS PEDIS      ANTERIOR TIBIAL  palpable   palpable     Gastrointestinal: soft, nontender, BS WNL, no r/g, no palpable masses. Musculoskeletal:no muscle atrophy/wasting. M/S 5/5 throughout, extremities without ischemic changes. Skin: No rashes, no ulcers, no cellulitis.   Neurologic:  A&O X 3; appropriate affect, sensation is normal; speech is normal, CN 2-12 intact, pain and light touch intact in extremities, motor exam as listed above. Psychiatric: Normal thought content, mood appropriate to clinical situation.    Assessment: Ricky Rowland is a 73 y.o. male who has no history of stroke or TIA.  Carotid duplex today show stable bilateral IC stenosis: 1-39% in the right, 40-59% in the left.  Fortunately he does not have  DM and stopped tobacco use in 1999.   He does not take a daily ASA as he states he had some mild bleeding or anemia issues.  He stays physically active.    DATA Carotid Duplex  (09-30-18): Right Carotid: Velocities in the right ICA are consistent with a 1-39% stenosis. Left Carotid: Velocities in the left ICA are consistent with a 40-59% stenosis. Vertebrals:  Bilateral vertebral arteries demonstrate antegrade flow. Subclavians: Normal flow hemodynamics were seen in bilateral subclavian arteries. No change compared to the exam on 04-01-18.    Plan: Follow-up in 1 year with Carotid Duplex scan.   I discussed in depth with the patient the nature of atherosclerosis, and emphasized the importance of maximal medical management including strict control of blood pressure, blood glucose, and lipid levels, obtaining regular exercise, and continued cessation of smoking.  The patient is aware that without maximal medical management the underlying atherosclerotic disease process will progress, limiting the benefit of any interventions. The patient was given information about stroke prevention and what symptoms should prompt the patient to seek immediate medical care. Thank you for allowing [place] to participate in this patient's care.  Clemon Chambers, RN, MSN, FNP-C [facility] Office: [phone]  Clinic Physician: Scot Dock  09/30/18 11:56 AM

## 2018-09-30 NOTE — Patient Instructions (Signed)

## 2018-10-12 ENCOUNTER — Encounter (HOSPITAL_COMMUNITY): Payer: Medicare Other

## 2018-10-13 NOTE — Progress Notes (Signed)
[facility] Summary Report / Loop Recorder 

## 2018-10-29 ENCOUNTER — Ambulatory Visit (INDEPENDENT_AMBULATORY_CARE_PROVIDER_SITE_OTHER): Payer: Medicare Other | Admitting: *Deleted

## 2018-10-29 DIAGNOSIS — R55 Syncope and collapse: Secondary | ICD-10-CM | POA: Diagnosis not present

## 2018-10-31 LAB — CUP PACEART REMOTE DEVICE CHECK
Date Time Interrogation Session: 20200821194119
Implantable Pulse Generator Implant Date: 20191029

## 2018-11-05 NOTE — Progress Notes (Signed)
[facility] Summary Report / Loop Recorder 

## 2018-12-01 ENCOUNTER — Ambulatory Visit (INDEPENDENT_AMBULATORY_CARE_PROVIDER_SITE_OTHER): Payer: Medicare Other | Admitting: *Deleted

## 2018-12-01 DIAGNOSIS — R55 Syncope and collapse: Secondary | ICD-10-CM | POA: Diagnosis not present

## 2018-12-02 LAB — CUP PACEART REMOTE DEVICE CHECK
Date Time Interrogation Session: 20200923201126
Implantable Pulse Generator Implant Date: 20191029

## 2018-12-07 NOTE — Progress Notes (Signed)
[facility] Summary Report / Loop Recorder 

## 2019-01-03 ENCOUNTER — Ambulatory Visit (INDEPENDENT_AMBULATORY_CARE_PROVIDER_SITE_OTHER): Payer: Medicare Other | Admitting: *Deleted

## 2019-01-03 DIAGNOSIS — R55 Syncope and collapse: Secondary | ICD-10-CM

## 2019-01-04 LAB — CUP PACEART REMOTE DEVICE CHECK
Date Time Interrogation Session: 20201026200752
Implantable Pulse Generator Implant Date: 20191029

## 2019-01-21 NOTE — Progress Notes (Signed)
[facility] Summary Report / Loop Recorder 

## 2019-02-06 LAB — CUP PACEART REMOTE DEVICE CHECK
Date Time Interrogation Session: 20201128165530
Implantable Pulse Generator Implant Date: 20191029

## 2019-02-07 ENCOUNTER — Ambulatory Visit (INDEPENDENT_AMBULATORY_CARE_PROVIDER_SITE_OTHER): Payer: Medicare Other | Admitting: *Deleted

## 2019-02-07 DIAGNOSIS — R55 Syncope and collapse: Secondary | ICD-10-CM

## 2019-02-14 ENCOUNTER — Ambulatory Visit: Payer: Medicare Other | Admitting: Cardiovascular Disease

## 2019-03-02 NOTE — Progress Notes (Signed)
ILR remote 

## 2019-03-10 ENCOUNTER — Ambulatory Visit (INDEPENDENT_AMBULATORY_CARE_PROVIDER_SITE_OTHER): Payer: Medicare Other | Admitting: *Deleted

## 2019-03-10 DIAGNOSIS — R55 Syncope and collapse: Secondary | ICD-10-CM

## 2019-03-11 LAB — CUP PACEART REMOTE DEVICE CHECK
Date Time Interrogation Session: 20201231191601
Implantable Pulse Generator Implant Date: 20191029

## 2019-03-14 ENCOUNTER — Telehealth: Payer: Self-pay

## 2019-03-14 NOTE — Telephone Encounter (Signed)
I let the pt know his monitor is working properly. I told him the only time he has to send a manual transmission is when we call and ask for one. The pt verbalized understanding and thanked me for the call.

## 2019-03-29 NOTE — Progress Notes (Signed)
Cardiology Office Note:    Date:  04/06/2019   ID:  Ricky Edinger Sr., DOB 07-Aug-1945, MRN OR:[id number]  PCP:  [name], MD  Cardiologist:  Jenkins Rouge, MD  Referring MD: [name], MD   No chief complaint on file.   History of Present Illness:    Ricky Rowland. is a 74 y.o. male with a past medical history significant for hypertension, hyperlipidemia, prostate cancer, diet-controlled diabetes and carotid stenosis. Long standing history of dizziness. First seen in August 2017 Negative previous w/u's Last admission 11/05/17   He was kept overnight for monitoring and telemetry did not reportedly reveal any arrhythmias.  He was only mildly orthostatic.  Echocardiogram showed normal LV systolic function EF [id number], mild LVH, no significant valvular disease.  Carotid ultrasound revealed 1-39% right ICA stenosis, 40-59% left ICA stenosis.  Seen by [facility] / EP service and had ILR implanted on 01/05/18  He was drinking about 3 vodka and tonic drinks every evening prior to this fall. He has stopped all alcohol for now. He is working on increasing his water intake- 64 oz/day. Stopped smoking in 1999  Carotid 09/30/18 stable left ICA 40-59%   No recurrent symptoms:  Retired from [employer] and helping to [employer] to golf but has not done so since March Wife is at home and healthy   Past Medical History:  Diagnosis Date  . Borderline diabetes    DIET CONTROLLED  . Dyslipidemia   . Elevated PSA   . External carotid artery stenosis    MILD-- BILATERAL  . Fall 11/05/2017  . GERD (gastroesophageal reflux disease)   . Gout, arthritis   . Hearing loss in left ear   . Heart murmur    MILD --  ASYMPTOMATIC  . History of gastric ulcer    REMOTE HX  . Hypertension   . Pre-diabetes   . Prostate cancer ([place]) 12/21/12   Gleason 3+4=7  . Wears glasses     Past Surgical History:  Procedure Laterality Date  . KNEE ARTHROSCOPY W/ MENISCAL REPAIR Left JAN  2014  . LOOP RECORDER INSERTION N/A 01/05/2018   Procedure: LOOP RECORDER INSERTION;  Surgeon: [name], MD;  Location: [place] [facility];  Service: Cardiovascular;  Laterality: N/A;  . LUMBAR SPINE SURGERY  1991  . PROSTATE BIOPSY N/A 12/21/2012   Procedure: BIOPSY TRANSRECTAL ULTRASONIC PROSTATE (TUBP);  Surgeon: [name], MD;  [facility];  Service: Urology;  Laterality: N/A;  . PROSTATE BIOPSY  11/26/11   benign    Current Medications: Current Meds  Medication Sig  . acetaminophen (TYLENOL) 650 MG CR tablet Take 650 mg by mouth every 8 (eight) hours.  Marland Kitchen allopurinol (ZYLOPRIM) 300 MG tablet Take 300 mg by mouth daily.   Marland Kitchen amLODipine (NORVASC) 5 MG tablet Take 5 mg by mouth daily.  . colchicine 0.6 MG tablet Take 0.6 mg by mouth 2 (two) times daily as needed (for gout).   . Naphazoline HCl (CLEAR EYES OP) Place 1 drop into both eyes daily as needed (dry eyes).   Marland Kitchen omeprazole (PRILOSEC) 20 MG capsule Take 20 mg by mouth daily.  Marland Kitchen REPATHA SURECLICK [id number] MG/ML SOAJ Inject 140 mg into the skin every 14 (fourteen) days.   Marland Kitchen tolnaftate (TINACTIN) 1 % cream Apply 1 application topically daily as needed (jock itch).  . valsartan (DIOVAN) 320 MG tablet Take 320 mg by mouth daily.     Allergies:   Statins  Social History   Socioeconomic History  . Marital status: Married    Spouse name: geraldine  . Number of children: 3  . Years of education: some college  . Highest education level: Not on file  Occupational History  . Occupation: retired  Tobacco Use  . Smoking status: Former Smoker    Packs/day: 1.00    Years: 30.00    Pack years: 30.00    Types: Cigarettes    Quit date: 12/15/1997    Years since quitting: 21.3  . Smokeless tobacco: Never Used  Substance and Sexual Activity  . Alcohol use: Yes    Alcohol/week: 20.0 standard drinks    Types: 20 Shots of liquor per week    Comment: OCCASIONAL, 12/28/17 stopped 11/05/17  . Drug use: No   . Sexual activity: Not on file  Other Topics Concern  . Not on file  Social History Narrative   Lives with wife at home    caffeine use- occas, mostly decaf coffee   Social Determinants of Health   Financial Resource Strain:   . Difficulty of Paying Living Expenses: Not on file  Food Insecurity:   . Worried About Charity fundraiser in the Last Year: Not on file  . Ran Out of Food in the Last Year: Not on file  Transportation Needs:   . Lack of Transportation (Medical): Not on file  . Lack of Transportation (Non-Medical): Not on file  Physical Activity:   . Days of Exercise per Week: Not on file  . Minutes of Exercise per Session: Not on file  Stress:   . Feeling of Stress : Not on file  Social Connections:   . Frequency of Communication with Friends and Family: Not on file  . Frequency of Social Gatherings with Friends and Family: Not on file  . Attends Religious Services: Not on file  . Active Member of Clubs or Organizations: Not on file  . Attends Archivist Meetings: Not on file  . Marital Status: Not on file     Family History: The patient's family history includes Cancer in his brother and sister; Heart disease in his father and mother. ROS:   Please see the history of present illness.     All other systems reviewed and are negative.  EKGs/Labs/Other Studies Reviewed:    The following studies were reviewed today:  Echocardiogram 11/06/2017 Study Conclusions - Left ventricle: The cavity size was normal. Wall thickness was   increased in a pattern of mild LVH. Systolic function was normal.   The estimated ejection fraction was in the range of 50% to 55%.   Wall motion was normal; there were no regional wall motion   abnormalities. - Aortic valve: Transvalvular velocity was within the normal range.   There was no stenosis. There was no significant regurgitation. - Mitral valve: There was trivial regurgitation. Valve area by   pressure half-time: 2.37  cm^2. - Left atrium: The atrium was mildly to moderately dilated. - Right ventricle: Systolic function was normal. - Atrial septum: No defect or patent foramen ovale was identified. - Tricuspid valve: There was mild regurgitation. - Pulmonic valve: There was no significant regurgitation. - Pulmonary arteries: Systolic pressure was mildly increased. PA   peak pressure: 35 mm Hg (S).  Impressions: - Normal LV EF, no significant valvular disease.  Carotid ultrasound 09/30/18  Final Interpretation: -Right Carotid: Velocities in the right ICA are consistent with a 1-39% stenosis. -Left Carotid: Velocities in the left ICA are  consistent with a 40-59% stenosis.  EKG:   12/24/17   SR rate 57 LVH no AV block  04/05/18 SR rate 68 LAD otherwise normal   Recent Labs: 07/08/2018: ALT 15; BUN 19; Creatinine 1.27; Hemoglobin 15.0; Platelet Count 154; Potassium 4.3; Sodium 141   Recent Lipid Panel    Component Value Date/Time   CHOL 168 11/06/2017 0524   TRIG 166 (H) 11/06/2017 0524   HDL 68 11/06/2017 0524   CHOLHDL 2.5 11/06/2017 0524   VLDL 33 11/06/2017 0524   LDLCALC 67 11/06/2017 0524    Physical Exam:    BP (!) 150/74   Pulse 68   Ht 6\' 3"  (1.905 m)   Wt 213 lb 1.9 oz (96.7 kg)   SpO2 96%   BMI 26.64 kg/m  Affect appropriate Healthy:  appears stated age HEENT: normal Neck supple with no adenopathy JVP normal no bruits no thyromegaly Lungs clear with no wheezing and good diaphragmatic motion Heart:  S1/S2 no murmur, no rub, gallop or click PMI normal ILR subQ left chest  Abdomen: benighn, BS positve, no tenderness, no AAA no bruit.  No HSM or HJR Distal pulses intact with no bruits No edema Neuro non-focal Skin warm and dry No muscular weakness   ASSESSMENT:    Pre Syncope  PLAN:    In order of problems listed above:  Dizziness/near syncope: Doubt cardiac etiology. Dr Caryl Comes also felt neurally mediated ILR no arrhythmia to date TTE no structural heart disease  and baseline ECG only chronic LAFB  Carotid stenosis: No symptoms of TIA or stroke.  Medical management with aspirin.  Apparently patient cannot tolerate statin.  Seen in the hospital by vascular surgery with planned 58-month follow-up with Dr. Trula Slade. DuplexOrdered August 2020 Carotid 09/30/18 left ICA 40-59% stenosis f/u July 2021  Hypertension: BP well controlled. Not orthostatic in the office today.   Medication Adjustments/Labs and Tests Ordered: Current medicines are reviewed at length with the patient today.  Concerns regarding medicines are outlined above. Labs and tests ordered and medication changes are outlined in the patient instructions below:  Patient Instructions  Medication Instructions:   *If you need a refill on your cardiac medications before your next appointment, please call your pharmacy*  Lab Work:  If you have labs (blood work) drawn today and your tests are completely normal, you will receive your results only by: Marland Kitchen MyChart Message (if you have MyChart) OR . A paper copy in the mail If you have any lab test that is abnormal or we need to change your treatment, we will call you to review the results.  Testing/Procedures: Your physician has requested that you have a carotid duplex in July. This test is an ultrasound of the carotid arteries in your neck. It looks at blood flow through these arteries that supply the brain with blood. Allow one hour for this exam. There are no restrictions or special instructions.  Follow-Up: At [facility], you and your health needs are our priority.  As part of our continuing mission to provide you with exceptional heart care, we have created designated Provider Care Teams.  These Care Teams include your primary Cardiologist (physician) and Advanced Practice Providers (APPs -  Physician Assistants and Nurse Practitioners) who all work together to provide you with the care you need, when you need it.  Your next appointment:   12  month(s)  The format for your next appointment:   In Person  Provider:   Jenkins Rouge, MD  Signed, Jenkins Rouge, MD  04/06/2019 9:59 AM    [place]

## 2019-04-06 ENCOUNTER — Encounter: Payer: Self-pay | Admitting: Cardiovascular Disease

## 2019-04-06 ENCOUNTER — Other Ambulatory Visit: Payer: Self-pay

## 2019-04-06 ENCOUNTER — Ambulatory Visit: Payer: Medicare Other | Admitting: Cardiovascular Disease

## 2019-04-06 VITALS — BP 150/74 | HR 68 | Ht 75.0 in | Wt 213.1 lb

## 2019-04-06 DIAGNOSIS — I10 Essential (primary) hypertension: Secondary | ICD-10-CM

## 2019-04-06 DIAGNOSIS — I779 Disorder of arteries and arterioles, unspecified: Secondary | ICD-10-CM

## 2019-04-06 NOTE — Patient Instructions (Addendum)
Medication Instructions:   *If you need a refill on your cardiac medications before your next appointment, please call your pharmacy*  Lab Work:  If you have labs (blood work) drawn today and your tests are completely normal, you will receive your results only by: Marland Kitchen MyChart Message (if you have MyChart) OR . A paper copy in the mail If you have any lab test that is abnormal or we need to change your treatment, we will call you to review the results.  Testing/Procedures: Your physician has requested that you have a carotid duplex in July. This test is an ultrasound of the carotid arteries in your neck. It looks at blood flow through these arteries that supply the brain with blood. Allow one hour for this exam. There are no restrictions or special instructions.  Follow-Up: At [facility], you and your health needs are our priority.  As part of our continuing mission to provide you with exceptional heart care, we have created designated Provider Care Teams.  These Care Teams include your primary Cardiologist (physician) and Advanced Practice Providers (APPs -  Physician Assistants and Nurse Practitioners) who all work together to provide you with the care you need, when you need it.  Your next appointment:   12 month(s)  The format for your next appointment:   In Person  Provider:   Jenkins Rouge, MD

## 2019-04-11 ENCOUNTER — Ambulatory Visit (INDEPENDENT_AMBULATORY_CARE_PROVIDER_SITE_OTHER): Payer: Medicare Other | Admitting: *Deleted

## 2019-04-11 DIAGNOSIS — R55 Syncope and collapse: Secondary | ICD-10-CM | POA: Diagnosis not present

## 2019-04-11 LAB — CUP PACEART REMOTE DEVICE CHECK
Date Time Interrogation Session: 20210201003309
Implantable Pulse Generator Implant Date: 20191029

## 2019-04-12 NOTE — Progress Notes (Signed)
ILR Remote 

## 2019-05-12 ENCOUNTER — Ambulatory Visit (INDEPENDENT_AMBULATORY_CARE_PROVIDER_SITE_OTHER): Payer: Medicare Other | Admitting: *Deleted

## 2019-05-12 DIAGNOSIS — R55 Syncope and collapse: Secondary | ICD-10-CM | POA: Diagnosis not present

## 2019-05-12 LAB — CUP PACEART REMOTE DEVICE CHECK
Date Time Interrogation Session: 20210304030947
Implantable Pulse Generator Implant Date: 20191029

## 2019-05-12 NOTE — Progress Notes (Signed)
ILR Remote 

## 2019-06-13 ENCOUNTER — Ambulatory Visit (INDEPENDENT_AMBULATORY_CARE_PROVIDER_SITE_OTHER): Payer: Medicare Other | Admitting: *Deleted

## 2019-06-13 DIAGNOSIS — R55 Syncope and collapse: Secondary | ICD-10-CM | POA: Diagnosis not present

## 2019-06-13 LAB — CUP PACEART REMOTE DEVICE CHECK
Date Time Interrogation Session: 20210404041222
Implantable Pulse Generator Implant Date: 20191029

## 2019-06-15 NOTE — Progress Notes (Signed)
ILR Remote 

## 2019-06-16 ENCOUNTER — Other Ambulatory Visit: Payer: Self-pay | Admitting: Internal Medicine

## 2019-06-16 ENCOUNTER — Ambulatory Visit
Admission: RE | Admit: 2019-06-16 | Discharge: 2019-06-16 | Disposition: A | Discharge status: Home / Self Care | Payer: Medicare Other | Source: Ambulatory Visit | Attending: Internal Medicine | Admitting: Internal Medicine

## 2019-06-16 DIAGNOSIS — M25572 Pain in left ankle and joints of left foot: Secondary | ICD-10-CM

## 2019-06-16 DIAGNOSIS — M25562 Pain in left knee: Secondary | ICD-10-CM

## 2019-07-11 ENCOUNTER — Inpatient Hospital Stay: Payer: Medicare Other | Attending: Internal Medicine

## 2019-07-11 ENCOUNTER — Other Ambulatory Visit: Payer: Self-pay

## 2019-07-11 DIAGNOSIS — Z8711 Personal history of peptic ulcer disease: Secondary | ICD-10-CM | POA: Diagnosis not present

## 2019-07-11 DIAGNOSIS — Z888 Allergy status to other drugs, medicaments and biological substances status: Secondary | ICD-10-CM | POA: Diagnosis not present

## 2019-07-11 DIAGNOSIS — Z8546 Personal history of malignant neoplasm of prostate: Secondary | ICD-10-CM | POA: Diagnosis not present

## 2019-07-11 DIAGNOSIS — Z79899 Other long term (current) drug therapy: Secondary | ICD-10-CM | POA: Diagnosis not present

## 2019-07-11 DIAGNOSIS — D472 Monoclonal gammopathy: Secondary | ICD-10-CM | POA: Diagnosis present

## 2019-07-11 DIAGNOSIS — I10 Essential (primary) hypertension: Secondary | ICD-10-CM | POA: Insufficient documentation

## 2019-07-11 DIAGNOSIS — Z923 Personal history of irradiation: Secondary | ICD-10-CM | POA: Insufficient documentation

## 2019-07-11 LAB — CMP (CANCER CENTER ONLY)
ALT: 17 U/L (ref 0–44)
AST: 28 U/L (ref 15–41)
Albumin: 3.7 g/dL (ref 3.5–5.0)
Alkaline Phosphatase: 91 U/L (ref 38–126)
Anion gap: 9 (ref 5–15)
BUN: 18 mg/dL (ref 8–23)
CO2: 26 mmol/L (ref 22–32)
Calcium: 9.8 mg/dL (ref 8.9–10.3)
Chloride: 106 mmol/L (ref 98–111)
Creatinine: 1.27 mg/dL — ABNORMAL HIGH (ref 0.61–1.24)
GFR, Est AFR Am: >60 mL/min (ref >60)
GFR, Estimated: 55 mL/min — ABNORMAL LOW (ref >60)
Glucose, Bld: 120 mg/dL — ABNORMAL HIGH (ref 70–99)
Potassium: 4.9 mmol/L (ref 3.5–5.1)
Sodium: 141 mmol/L (ref 135–145)
Total Bilirubin: 0.7 mg/dL (ref 0.3–1.2)
Total Protein: 7.7 g/dL (ref 6.5–8.1)

## 2019-07-11 LAB — CBC WITH DIFFERENTIAL (CANCER CENTER ONLY)
Abs Immature Granulocytes: 0.01 10*3/uL (ref 0.00–0.07)
Basophils Absolute: 0 10*3/uL (ref 0.0–0.1)
Basophils Relative: 0 %
Eosinophils Absolute: 0.1 10*3/uL (ref 0.0–0.5)
Eosinophils Relative: 1 %
HCT: 41.5 % (ref 39.0–52.0)
Hemoglobin: 13.5 g/dL (ref 13.0–17.0)
Immature Granulocytes: 0 %
Lymphocytes Relative: 20 %
Lymphs Abs: 1.3 10*3/uL (ref 0.7–4.0)
MCH: 33.1 pg (ref 26.0–34.0)
MCHC: 32.5 g/dL (ref 30.0–36.0)
MCV: 101.7 fL — ABNORMAL HIGH (ref 80.0–100.0)
Monocytes Absolute: 0.5 10*3/uL (ref 0.1–1.0)
Monocytes Relative: 8 %
Neutro Abs: 4.7 10*3/uL (ref 1.7–7.7)
Neutrophils Relative %: 71 %
Platelet Count: 166 10*3/uL (ref 150–400)
RBC: 4.08 MIL/uL — ABNORMAL LOW (ref 4.22–5.81)
RDW: 13.5 % (ref 11.5–15.5)
WBC Count: 6.7 10*3/uL (ref 4.0–10.5)
nRBC: 0 % (ref 0.0–0.2)

## 2019-07-11 LAB — LACTATE DEHYDROGENASE: LDH: 226 U/L — ABNORMAL HIGH (ref 98–192)

## 2019-07-12 LAB — KAPPA/LAMBDA LIGHT CHAINS
Kappa free light chain: 62.2 mg/L — ABNORMAL HIGH (ref 3.3–19.4)
Kappa, lambda light chain ratio: 1.69 — ABNORMAL HIGH (ref 0.26–1.65)
Lambda free light chains: 36.7 mg/L — ABNORMAL HIGH (ref 5.7–26.3)

## 2019-07-12 LAB — PROSTATE-SPECIFIC AG, SERUM (LABCORP): Prostate Specific Ag, Serum: 0.5 ng/mL (ref 0.0–4.0)

## 2019-07-12 LAB — IGG, IGA, IGM
IgA: 365 mg/dL (ref 61–437)
IgG (Immunoglobin G), Serum: 999 mg/dL (ref 603–1613)
IgM (Immunoglobulin M), Srm: 88 mg/dL (ref 15–143)

## 2019-07-12 LAB — BETA 2 MICROGLOBULIN, SERUM: Beta-2 Microglobulin: 2 mg/L (ref 0.6–2.4)

## 2019-07-14 LAB — CUP PACEART REMOTE DEVICE CHECK
Date Time Interrogation Session: 20210505041612
Implantable Pulse Generator Implant Date: 20191029

## 2019-07-18 ENCOUNTER — Other Ambulatory Visit: Payer: Self-pay | Admitting: Internal Medicine

## 2019-07-18 ENCOUNTER — Inpatient Hospital Stay: Payer: Medicare Other | Admitting: Internal Medicine

## 2019-07-18 ENCOUNTER — Ambulatory Visit (INDEPENDENT_AMBULATORY_CARE_PROVIDER_SITE_OTHER): Payer: Medicare Other | Admitting: *Deleted

## 2019-07-18 ENCOUNTER — Encounter: Payer: Self-pay | Admitting: Internal Medicine

## 2019-07-18 ENCOUNTER — Other Ambulatory Visit: Payer: Self-pay

## 2019-07-18 VITALS — BP 139/67 | HR 67 | Temp 98.7°F | Resp 18 | Ht 75.0 in | Wt 221.9 lb

## 2019-07-18 DIAGNOSIS — Z8546 Personal history of malignant neoplasm of prostate: Secondary | ICD-10-CM | POA: Diagnosis not present

## 2019-07-18 DIAGNOSIS — D472 Monoclonal gammopathy: Secondary | ICD-10-CM | POA: Diagnosis not present

## 2019-07-18 DIAGNOSIS — R55 Syncope and collapse: Secondary | ICD-10-CM | POA: Diagnosis not present

## 2019-07-18 DIAGNOSIS — I10 Essential (primary) hypertension: Secondary | ICD-10-CM | POA: Diagnosis not present

## 2019-07-18 NOTE — Progress Notes (Signed)
[facility] Summary Report / Loop Recorder 

## 2019-07-18 NOTE — Progress Notes (Signed)
[name] [phone]   [phone] OFFICE PROGRESS NOTE  Ricky Carol, MD [address] [place] [address]  DIAGNOSIS:  1) Monoclonal gammopathy of undetermined significance (MGUS). 2) History of prostate cancer T1C followed by Dr. [name],  status post curative radiotherapy under the care of Dr. [name].  PRIOR THERAPY: Curative radiotherapy to the prostate under the care of Dr. [name].  CURRENT THERAPY: Observation.  INTERVAL HISTORY: Ricky Rowland Sr. 74 y.o. male returns to the clinic today for annual follow-up visit.  The patient is feeling fine today with no concerning complaints.  He denied having any recent weight loss or night sweats.  He has no nausea, vomiting, diarrhea or constipation.  He denied having any headache or visual changes.  He has no chest pain, shortness of breath, cough or hemoptysis.  He is here today for evaluation with repeat myeloma panel.  MEDICAL HISTORY: Past Medical History:  Diagnosis Date  . Borderline diabetes    DIET CONTROLLED  . Dyslipidemia   . Elevated PSA   . External carotid artery stenosis    MILD-- BILATERAL  . Fall 11/05/2017  . GERD (gastroesophageal reflux disease)   . Gout, arthritis   . Hearing loss in left ear   . Heart murmur    MILD --  ASYMPTOMATIC  . History of gastric ulcer    REMOTE HX  . Hypertension   . Pre-diabetes   . Prostate cancer ([name]) 12/21/12   Gleason 3+4=7  . Wears glasses     ALLERGIES:  is allergic to statins.  MEDICATIONS:  Current Outpatient Medications  Medication Sig Dispense Refill  . acetaminophen (TYLENOL) 650 MG CR tablet Take 650 mg by mouth every 8 (eight) hours.    Marland Kitchen allopurinol (ZYLOPRIM) 300 MG tablet Take 300 mg by mouth daily.     Marland Kitchen amLODipine (NORVASC) 5 MG tablet Take 5 mg by mouth daily.  1  . colchicine 0.6 MG tablet Take 0.6 mg by mouth 2 (two) times daily as needed (for gout).     . Naphazoline HCl (CLEAR EYES  OP) Place 1 drop into both eyes daily as needed (dry eyes).     Marland Kitchen omeprazole (PRILOSEC) 20 MG capsule Take 20 mg by mouth daily.    Marland Kitchen REPATHA SURECLICK [id number] MG/ML SOAJ Inject 140 mg into the skin every 14 (fourteen) days.     Marland Kitchen tolnaftate (TINACTIN) 1 % cream Apply 1 application topically daily as needed (jock itch).    . valsartan (DIOVAN) 320 MG tablet Take 320 mg by mouth daily.  0   No current facility-administered medications for this visit.    SURGICAL HISTORY:  Past Surgical History:  Procedure Laterality Date  . KNEE ARTHROSCOPY W/ MENISCAL REPAIR Left JAN 2014  . LOOP RECORDER INSERTION N/A 01/05/2018   Procedure: LOOP RECORDER INSERTION;  Surgeon: [name], MD;  Location: [place] [facility];  Service: Cardiovascular;  Laterality: N/A;  . LUMBAR SPINE SURGERY  1991  . PROSTATE BIOPSY N/A 12/21/2012   Procedure: BIOPSY TRANSRECTAL ULTRASONIC PROSTATE (TUBP);  Surgeon: [name], MD;  Location: [facility];  Service: Urology;  Laterality: N/A;  . PROSTATE BIOPSY  11/26/11   benign    REVIEW OF SYSTEMS:  A comprehensive review of systems was negative.   PHYSICAL EXAMINATION: General appearance: alert, cooperative and no distress Head: Normocephalic, without obvious abnormality, atraumatic Neck: no adenopathy, no JVD, supple, symmetrical, trachea midline  and thyroid not enlarged, symmetric, no tenderness/mass/nodules Lymph nodes: Cervical, supraclavicular, and axillary nodes normal. Resp: clear to auscultation bilaterally Back: symmetric, no curvature. ROM normal. No CVA tenderness. Cardio: regular rate and rhythm, S1, S2 normal, no murmur, click, rub or gallop GI: soft, non-tender; bowel sounds normal; no masses,  no organomegaly Extremities: extremities normal, atraumatic, no cyanosis or edema  ECOG PERFORMANCE STATUS: 1 - Symptomatic but completely ambulatory  Blood pressure 139/67, pulse 67, temperature 98.7 F (37.1 C), temperature source  Temporal, resp. rate 18, height 6\' 3"  (1.905 m), weight 221 lb 14.4 oz (100.7 kg), SpO2 100 %.  LABORATORY DATA: Lab Results  Component Value Date   WBC 6.7 07/11/2019   HGB 13.5 07/11/2019   HCT 41.5 07/11/2019   MCV 101.7 (H) 07/11/2019   PLT 166 07/11/2019      Chemistry      Component Value Date/Time   NA 141 07/11/2019 1031   NA 140 06/18/2016 0857   K 4.9 07/11/2019 1031   K 4.2 06/18/2016 0857   CL 106 07/11/2019 1031   CO2 26 07/11/2019 1031   CO2 25 06/18/2016 0857   BUN 18 07/11/2019 1031   BUN 14.7 06/18/2016 0857   CREATININE 1.27 (H) 07/11/2019 1031   CREATININE 1.1 06/18/2016 0857      Component Value Date/Time   CALCIUM 9.8 07/11/2019 1031   CALCIUM 10.1 06/18/2016 0857   ALKPHOS 91 07/11/2019 1031   ALKPHOS 79 06/18/2016 0857   AST 28 07/11/2019 1031   AST 43 (H) 06/18/2016 0857   ALT 17 07/11/2019 1031   ALT 41 06/18/2016 0857   BILITOT 0.7 07/11/2019 1031   BILITOT 0.73 06/18/2016 0857     [place] lab results LTS FINAL DIAGNOSIS ASSESSMENT AND PLAN:  This is a very pleasant 74 years old African-American male with monoclonal gammopathy of undetermined significance and has been observation for several years.  The patient is feeling fine today with no concerning complaints. His myeloma panel showed no significant increase in the IgG or the free kappa light chain. I recommended for the patient to continue on observation with repeat myeloma panel in 1 year. For the history of prostate cancer, his PSA is within the normal range today.  We will continue to monitor on annual basis with the next blood work. The patient was advised to call immediately if he has any concerning symptoms in the interval. The patient voices understanding of current disease status and treatment options and is in agreement with the current care plan. All questions were answered. The patient knows to call the clinic with any problems, questions or concerns. We can certainly see the  patient much sooner if necessary.   Disclaimer: This note was dictated with voice recognition software. Similar sounding words can inadvertently be transcribed and may not be corrected upon review.

## 2019-07-20 ENCOUNTER — Telehealth: Payer: Self-pay | Admitting: Internal Medicine

## 2019-07-20 NOTE — Telephone Encounter (Signed)
Scheduled per los. Called and left msg. Mailed printout  °

## 2019-08-22 ENCOUNTER — Ambulatory Visit (INDEPENDENT_AMBULATORY_CARE_PROVIDER_SITE_OTHER): Payer: Medicare Other | Admitting: *Deleted

## 2019-08-22 DIAGNOSIS — R55 Syncope and collapse: Secondary | ICD-10-CM

## 2019-08-22 LAB — CUP PACEART REMOTE DEVICE CHECK
Date Time Interrogation Session: 20210613235841
Implantable Pulse Generator Implant Date: 20191029

## 2019-08-23 NOTE — Progress Notes (Signed)
[facility] Summary Report / Loop Recorder 

## 2019-09-04 IMAGING — CR DG RIBS W/ CHEST 3+V BILAT
5 series · 5 of 5 positions shown · non-contrast
Comparison: None.

CLINICAL DATA: Patient fell on coffee table. Left anterior rib
pain.

EXAM:
BILATERAL RIBS AND CHEST - 4+ VIEW

[w chest pa]
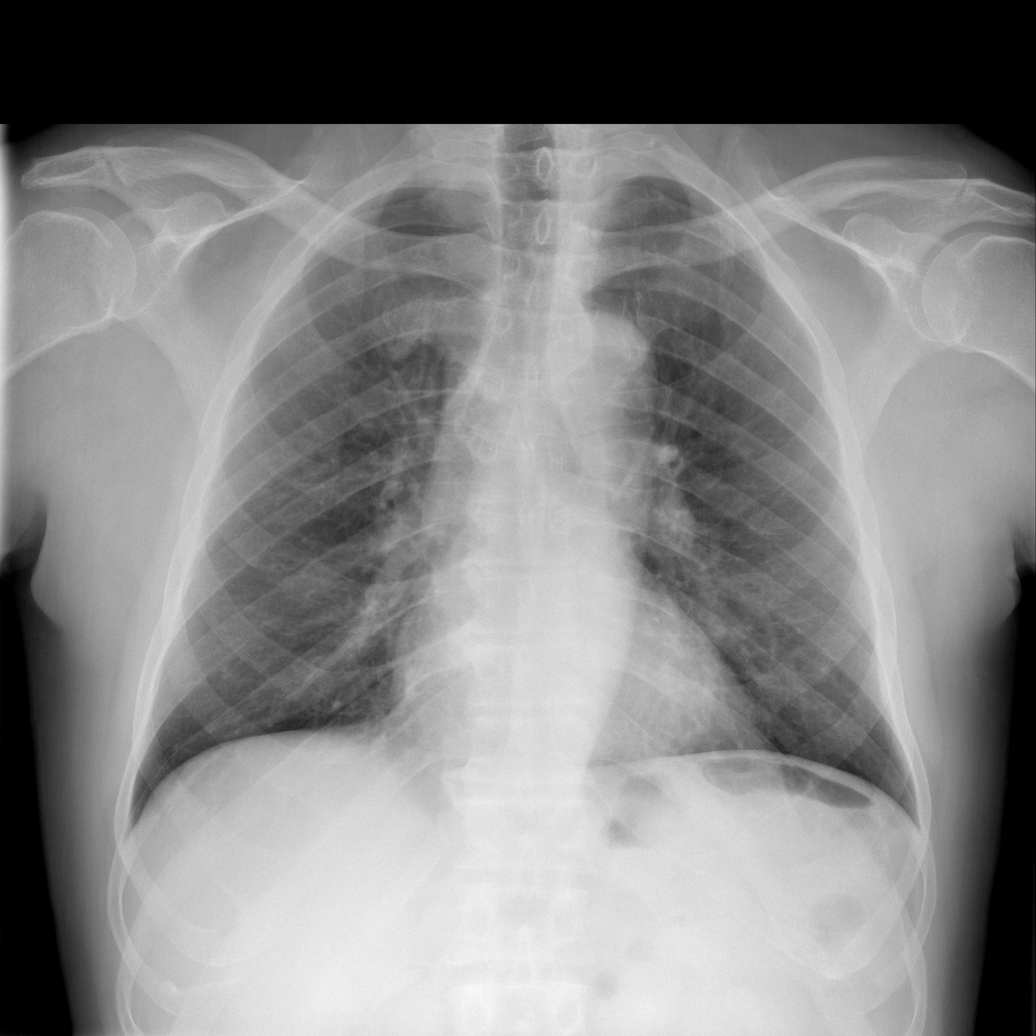

[w ribs ap/pa lower left *]
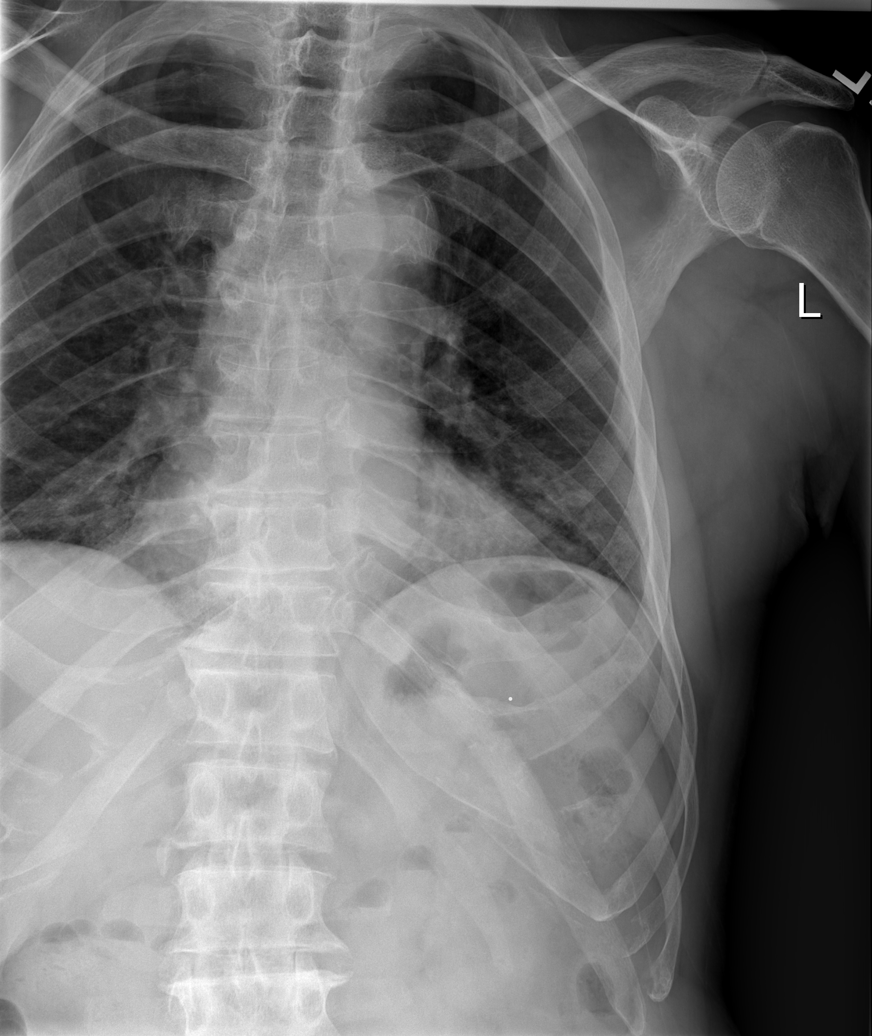

[w ribs oblique left *]
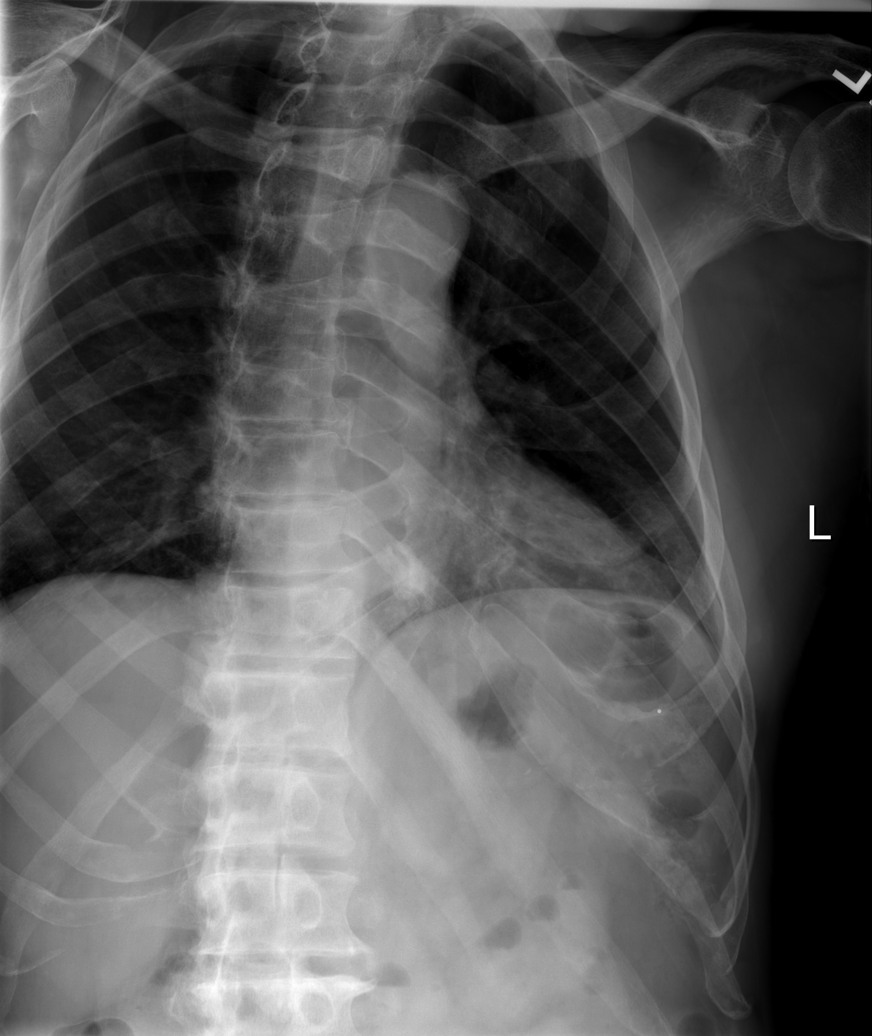

[w ribs ap/pa lower right *]
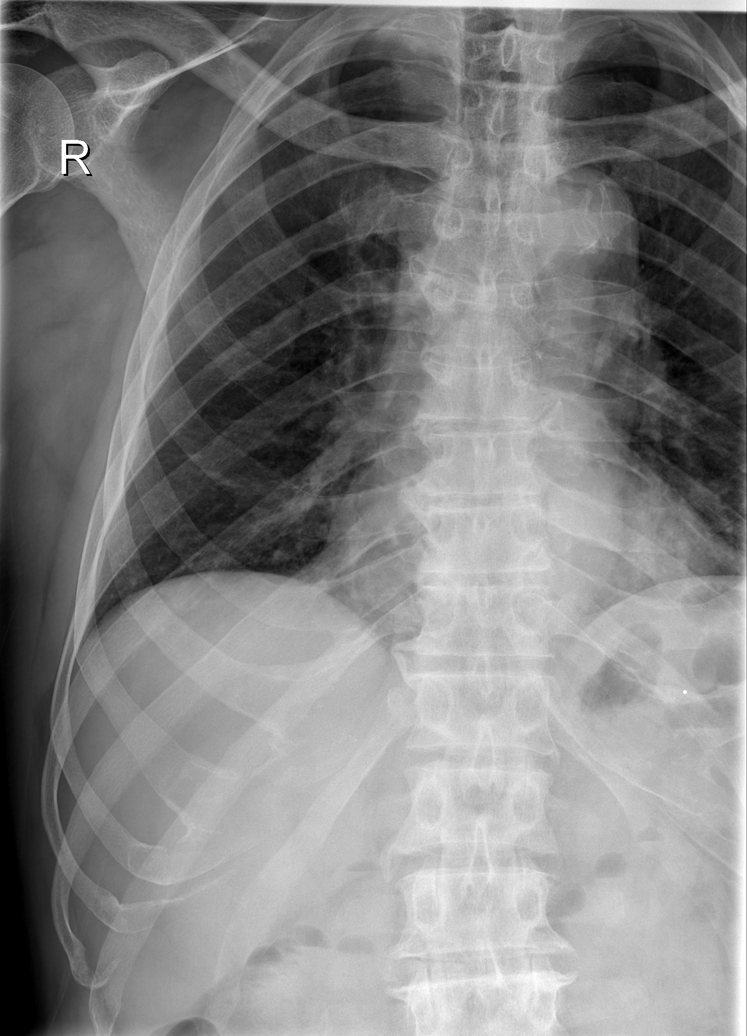

[w ribs oblique right *]
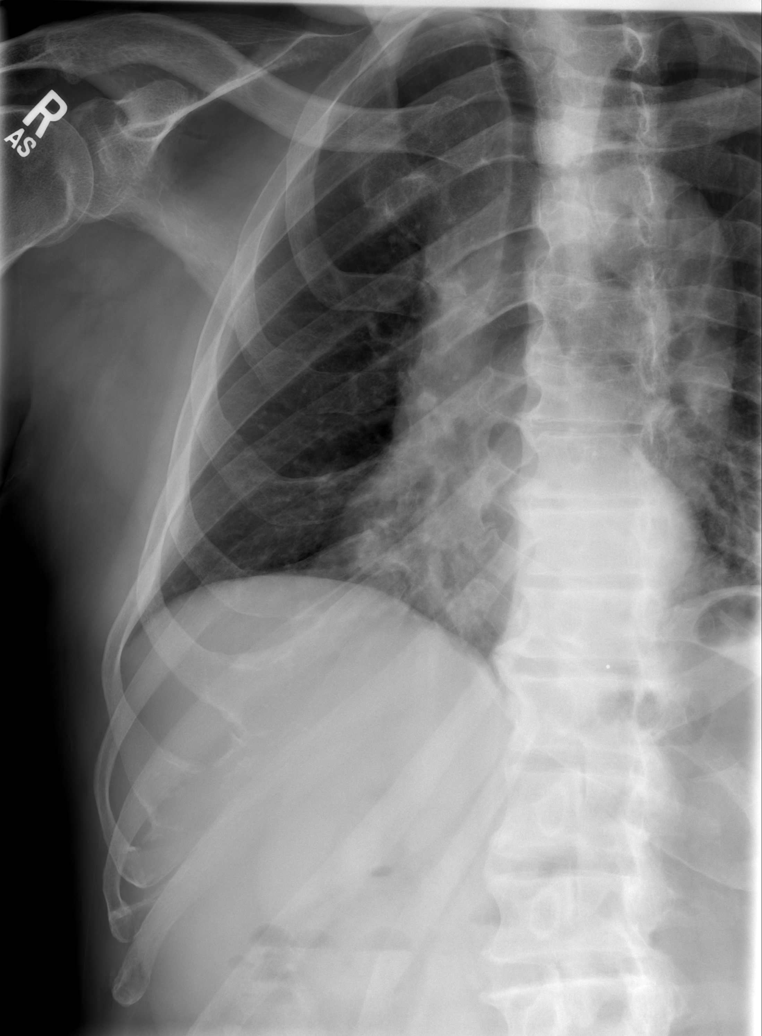

[5 of 5 positions shown; findings below may reference images not displayed]

FINDINGS: The lungs are clear without focal pneumonia, edema, pneumothorax or
pleural effusion. The cardiopericardial silhouette is within normal
limits for size.

Radio-opaque marker has been placed on the skin at the site of
patient concern. Oblique views of the left ribs show no evidence for
a displaced acute left-sided rib fracture.

Oblique views of the right ribs are also without evidence of an
acute displaced rib fracture.
IMPRESSION: Negative.

## 2019-09-21 ENCOUNTER — Other Ambulatory Visit: Payer: Self-pay

## 2019-09-21 ENCOUNTER — Other Ambulatory Visit (HOSPITAL_COMMUNITY): Payer: Self-pay | Admitting: Cardiovascular Disease

## 2019-09-21 ENCOUNTER — Ambulatory Visit (HOSPITAL_COMMUNITY)
Admission: RE | Admit: 2019-09-21 | Discharge: 2019-09-21 | Disposition: A | Discharge status: Home / Self Care | Payer: Medicare Other | Source: Ambulatory Visit | Attending: Internal Medicine | Admitting: Internal Medicine

## 2019-09-21 DIAGNOSIS — I10 Essential (primary) hypertension: Secondary | ICD-10-CM | POA: Diagnosis present

## 2019-09-21 DIAGNOSIS — I6523 Occlusion and stenosis of bilateral carotid arteries: Secondary | ICD-10-CM

## 2019-09-21 DIAGNOSIS — I779 Disorder of arteries and arterioles, unspecified: Secondary | ICD-10-CM | POA: Insufficient documentation

## 2019-09-26 ENCOUNTER — Ambulatory Visit (INDEPENDENT_AMBULATORY_CARE_PROVIDER_SITE_OTHER): Payer: Medicare Other | Admitting: *Deleted

## 2019-09-26 DIAGNOSIS — R55 Syncope and collapse: Secondary | ICD-10-CM

## 2019-09-26 LAB — CUP PACEART REMOTE DEVICE CHECK
Date Time Interrogation Session: 20210718232323
Implantable Pulse Generator Implant Date: 20191029

## 2019-09-27 NOTE — Progress Notes (Signed)
[facility] Summary Report / Loop Recorder 

## 2019-10-29 LAB — CUP PACEART REMOTE DEVICE CHECK
Date Time Interrogation Session: 20210819001415
Implantable Pulse Generator Implant Date: 20191029

## 2019-10-31 ENCOUNTER — Ambulatory Visit (INDEPENDENT_AMBULATORY_CARE_PROVIDER_SITE_OTHER): Payer: Medicare Other | Admitting: *Deleted

## 2019-10-31 DIAGNOSIS — R55 Syncope and collapse: Secondary | ICD-10-CM | POA: Diagnosis not present

## 2019-11-07 NOTE — Progress Notes (Signed)
[facility] Summary Report / Loop Recorder 

## 2019-12-04 LAB — CUP PACEART REMOTE DEVICE CHECK
Date Time Interrogation Session: 20210919003013
Implantable Pulse Generator Implant Date: 20191029

## 2019-12-05 ENCOUNTER — Ambulatory Visit (INDEPENDENT_AMBULATORY_CARE_PROVIDER_SITE_OTHER): Payer: Medicare Other | Admitting: Emergency Medicine

## 2019-12-05 DIAGNOSIS — R55 Syncope and collapse: Secondary | ICD-10-CM

## 2019-12-08 NOTE — Progress Notes (Signed)
[facility] Summary Report / Loop Recorder 

## 2019-12-28 ENCOUNTER — Ambulatory Visit (INDEPENDENT_AMBULATORY_CARE_PROVIDER_SITE_OTHER): Payer: Medicare Other

## 2019-12-28 DIAGNOSIS — R55 Syncope and collapse: Secondary | ICD-10-CM

## 2019-12-28 LAB — CUP PACEART REMOTE DEVICE CHECK
Date Time Interrogation Session: 20211020022325
Implantable Pulse Generator Implant Date: 20191029

## 2020-01-03 NOTE — Progress Notes (Signed)
[facility] Summary Report / Loop Recorder 

## 2020-01-30 ENCOUNTER — Ambulatory Visit (INDEPENDENT_AMBULATORY_CARE_PROVIDER_SITE_OTHER): Payer: Medicare Other

## 2020-01-30 DIAGNOSIS — R55 Syncope and collapse: Secondary | ICD-10-CM

## 2020-01-30 LAB — CUP PACEART REMOTE DEVICE CHECK
Date Time Interrogation Session: 20211120013339
Implantable Pulse Generator Implant Date: 20191029

## 2020-02-06 NOTE — Progress Notes (Signed)
[facility] Summary Report / Loop Recorder 

## 2020-03-05 ENCOUNTER — Ambulatory Visit (INDEPENDENT_AMBULATORY_CARE_PROVIDER_SITE_OTHER): Payer: Medicare Other

## 2020-03-05 DIAGNOSIS — R55 Syncope and collapse: Secondary | ICD-10-CM

## 2020-03-06 LAB — CUP PACEART REMOTE DEVICE CHECK
Date Time Interrogation Session: 20211221013739
Implantable Pulse Generator Implant Date: 20191029

## 2020-03-15 NOTE — Progress Notes (Signed)
[facility] Summary Report / Loop Recorder 

## 2020-04-06 NOTE — Progress Notes (Signed)
Cardiology Office Note:    Date:  04/09/2020   ID:  Ricky Edinger Sr., DOB 23-Mar-1945, MRN [id number]  PCP:  [name], MD  Cardiologist:  Jenkins Rouge, MD  Referring MD: [name], MD   No chief complaint on file.   History of Present Illness:    Ricky Rowland. is a 75 y.o. male with a past medical history significant for hypertension, hyperlipidemia, prostate cancer, diet-controlled diabetes and carotid stenosis. Long standing history of dizziness.   2019 seen for dizziness He was kept overnight for monitoring and telemetry did not reportedly reveal any arrhythmias.  He was only mildly orthostatic.  Echocardiogram showed normal LV systolic function EF 34-28%, mild LVH, no significant valvular disease.  Carotid ultrasound revealed 1-39% right ICA stenosis, 40-59% left ICA stenosis.  Seen by [facility] / EP service and had ILR implanted on 01/05/18  He was drinking about 3 vodka and tonic drinks every evening prior to this fall. He has stopped all alcohol for now. He is working on increasing his water intake- 64 oz/day. Stopped smoking in 1999  Carotid 09/21/19 stable left ICA 40-59%   Retired from [employer] and helping to [employer] to golf but has not done so since March Wife is at home and healthy   BP labile but overall good Grandson getting married in spring Has had vaccine and booster   Past Medical History:  Diagnosis Date  . Borderline diabetes    DIET CONTROLLED  . Dyslipidemia   . Elevated PSA   . External carotid artery stenosis    MILD-- BILATERAL  . Fall 11/05/2017  . GERD (gastroesophageal reflux disease)   . Gout, arthritis   . Hearing loss in left ear   . Heart murmur    MILD --  ASYMPTOMATIC  . History of gastric ulcer    REMOTE HX  . Hypertension   . Pre-diabetes   . Prostate cancer ([place]) 12/21/12   Gleason 3+4=7  . Wears glasses     Past Surgical History:  Procedure Laterality Date  . KNEE ARTHROSCOPY W/  MENISCAL REPAIR Left JAN 2014  . LOOP RECORDER INSERTION N/A 01/05/2018   Procedure: LOOP RECORDER INSERTION;  Surgeon: [name], MD;  Location: [place] [facility];  Service: Cardiovascular;  Laterality: N/A;  . LUMBAR SPINE SURGERY  1991  . PROSTATE BIOPSY N/A 12/21/2012   Procedure: BIOPSY TRANSRECTAL ULTRASONIC PROSTATE (TUBP);  Surgeon: [name], MD;  Location: [facility];  Service: Urology;  Laterality: N/A;  . PROSTATE BIOPSY  11/26/11   benign    Current Medications: Current Meds  Medication Sig  . acetaminophen (TYLENOL) 650 MG CR tablet Take 650 mg by mouth every 8 (eight) hours.  Marland Kitchen allopurinol (ZYLOPRIM) 300 MG tablet Take 300 mg by mouth daily.   Marland Kitchen amLODipine (NORVASC) 5 MG tablet Take 5 mg by mouth daily.  . colchicine 0.6 MG tablet Take 0.6 mg by mouth 2 (two) times daily as needed (for gout).   . Naphazoline HCl (CLEAR EYES OP) Place 1 drop into both eyes daily as needed (dry eyes).   Marland Kitchen omeprazole (PRILOSEC) 20 MG capsule Take 20 mg by mouth daily.  Marland Kitchen REPATHA SURECLICK 768 MG/ML SOAJ Inject 140 mg into the skin every 14 (fourteen) days.   Marland Kitchen tolnaftate (TINACTIN) 1 % cream Apply 1 application topically daily as needed (jock itch).  . valsartan (DIOVAN) 320 MG tablet Take 320 mg by mouth daily.  Allergies:   Statins   Social History   Socioeconomic History  . Marital status: Married    Spouse name: Ricky Rowland  . Number of children: 3  . Years of education: some college  . Highest education level: Not on file  Occupational History  . Occupation: retired  Tobacco Use  . Smoking status: Former Smoker    Packs/day: 1.00    Years: 30.00    Pack years: 30.00    Types: Cigarettes    Quit date: 12/15/1997    Years since quitting: 22.3  . Smokeless tobacco: Never Used  Vaping Use  . Vaping Use: Never used  Substance and Sexual Activity  . Alcohol use: Yes    Alcohol/week: 20.0 standard drinks    Types: 20 Shots of liquor per week     Comment: OCCASIONAL, 12/28/17 stopped 11/05/17  . Drug use: No  . Sexual activity: Not on file  Other Topics Concern  . Not on file  Social History Narrative   Lives with wife at home    caffeine use- occas, mostly decaf coffee   Social Determinants of Health   Financial Resource Strain: Not on file  Food Insecurity: Not on file  Transportation Needs: Not on file  Physical Activity: Not on file  Stress: Not on file  Social Connections: Not on file     Family History: The patient's family history includes Cancer in his brother and sister; Heart disease in his father and mother. ROS:   Please see the history of present illness.     All other systems reviewed and are negative.  EKGs/Labs/Other Studies Reviewed:    The following studies were reviewed today:  Echocardiogram 11/06/2017 Study Conclusions - Left ventricle: The cavity size was normal. Wall thickness was   increased in a pattern of mild LVH. Systolic function was normal.   The estimated ejection fraction was in the range of 50% to 55%.   Wall motion was normal; there were no regional wall motion   abnormalities. - Aortic valve: Transvalvular velocity was within the normal range.   There was no stenosis. There was no significant regurgitation. - Mitral valve: There was trivial regurgitation. Valve area by   pressure half-time: 2.37 cm^2. - Left atrium: The atrium was mildly to moderately dilated. - Right ventricle: Systolic function was normal. - Atrial septum: No defect or patent foramen ovale was identified. - Tricuspid valve: There was mild regurgitation. - Pulmonic valve: There was no significant regurgitation. - Pulmonary arteries: Systolic pressure was mildly increased. PA   peak pressure: 35 mm Hg (S).  Impressions: - Normal LV EF, no significant valvular disease.  Carotid ultrasound 09/21/19  Final Interpretation: -Right Carotid: Velocities in the right ICA are consistent with a 1-39% stenosis. -Left  Carotid: Velocities in the left ICA are consistent with a 40-59% stenosis.  EKG:   12/24/17   SR rate 57 LVH no AV block  04/05/18 SR rate 68 LAD otherwise normal   Recent Labs: 07/11/2019: ALT 17; BUN 18; Creatinine 1.27; Hemoglobin 13.5; Platelet Count 166; Potassium 4.9; Sodium 141   Recent Lipid Panel    Component Value Date/Time   CHOL 168 11/06/2017 0524   TRIG 166 (H) 11/06/2017 0524   HDL 68 11/06/2017 0524   CHOLHDL 2.5 11/06/2017 0524   VLDL 33 11/06/2017 0524   LDLCALC 67 11/06/2017 0524    Physical Exam:    BP (!) 169/66   Pulse 62   Ht 6\' 3"  (1.905 m)  Wt 95.3 kg   BMI 26.25 kg/m   No distress No JVP elevation  No tachypnea    ASSESSMENT:    Pre Syncope  PLAN:    In order of problems listed above:  Dizziness/near syncope: Doubt cardiac etiology. Dr Caryl Comes also felt neurally mediated ILR no arrhythmia to date TTE no structural heart disease and baseline ECG only chronic LAFB  Carotid stenosis: No symptoms of TIA or stroke.  Medical management with aspirin.  Apparently patient cannot tolerate statin.  Seen in the hospital by vascular surgery  Duplex 9/67/89 38-10% LICA stenosis   Hypertension: labile f/u Dr Delfina Redwood   Medication Adjustments/Labs and Tests Ordered:  Carotid Duplex July 2022 F/U cardiology PRN   Signed, Jenkins Rouge, MD  04/09/2020 8:48 AM    [place]

## 2020-04-09 ENCOUNTER — Other Ambulatory Visit: Payer: Self-pay

## 2020-04-09 ENCOUNTER — Ambulatory Visit (INDEPENDENT_AMBULATORY_CARE_PROVIDER_SITE_OTHER): Payer: Medicare Other

## 2020-04-09 ENCOUNTER — Telehealth (INDEPENDENT_AMBULATORY_CARE_PROVIDER_SITE_OTHER): Payer: Medicare Other | Admitting: Cardiovascular Disease

## 2020-04-09 VITALS — BP 169/66 | HR 62 | Ht 75.0 in | Wt 210.0 lb

## 2020-04-09 DIAGNOSIS — R55 Syncope and collapse: Secondary | ICD-10-CM | POA: Diagnosis not present

## 2020-04-09 DIAGNOSIS — I10 Essential (primary) hypertension: Secondary | ICD-10-CM | POA: Diagnosis not present

## 2020-04-09 DIAGNOSIS — I6522 Occlusion and stenosis of left carotid artery: Secondary | ICD-10-CM | POA: Diagnosis not present

## 2020-04-09 LAB — CUP PACEART REMOTE DEVICE CHECK
Date Time Interrogation Session: 20220129231316
Implantable Pulse Generator Implant Date: 20191029

## 2020-04-09 NOTE — Patient Instructions (Addendum)
Medication Instructions:  *If you need a refill on your cardiac medications before your next appointment, please call your pharmacy*   Lab Work: If you have labs (blood work) drawn today and your tests are completely normal, you will receive your results only by: Marland Kitchen MyChart Message (if you have MyChart) OR . A paper copy in the mail If you have any lab test that is abnormal or we need to change your treatment, we will call you to review the results.   Testing/Procedures: Your physician has requested that you have a carotid duplex in July. This test is an ultrasound of the carotid arteries in your neck. It looks at blood flow through these arteries that supply the brain with blood. Allow one hour for this exam. There are no restrictions or special instructions.  Follow-Up: At [facility], you and your health needs are our priority.  As part of our continuing mission to provide you with exceptional heart care, we have created designated Provider Care Teams.  These Care Teams include your primary Cardiologist (physician) and Advanced Practice Providers (APPs -  Physician Assistants and Nurse Practitioners) who all work together to provide you with the care you need, when you need it.  We recommend signing up for the patient portal called "MyChart".  Sign up information is provided on this After Visit Summary.  MyChart is used to connect with patients for Virtual Visits (Telemedicine).  Patients are able to view lab/test results, encounter notes, upcoming appointments, etc.  Non-urgent messages can be sent to your provider as well.   To learn more about what you can do with MyChart, go to [email or web address].    Your next appointment:   12 month(s)  The format for your next appointment:   In Person  Provider:   You may see Jenkins Rouge, MD or one of the following Advanced Practice Providers on your designated Care Team:    Kathyrn Drown, NP

## 2020-04-18 NOTE — Progress Notes (Signed)
[facility] Summary Report / Loop Recorder 

## 2020-05-14 ENCOUNTER — Ambulatory Visit (INDEPENDENT_AMBULATORY_CARE_PROVIDER_SITE_OTHER): Payer: Medicare Other

## 2020-05-14 DIAGNOSIS — R55 Syncope and collapse: Secondary | ICD-10-CM

## 2020-05-15 LAB — CUP PACEART REMOTE DEVICE CHECK
Date Time Interrogation Session: 20220301231838
Implantable Pulse Generator Implant Date: 20191029

## 2020-05-22 NOTE — Progress Notes (Signed)
[facility] Summary Report / Loop Recorder 

## 2020-06-11 ENCOUNTER — Ambulatory Visit (INDEPENDENT_AMBULATORY_CARE_PROVIDER_SITE_OTHER): Payer: Medicare Other

## 2020-06-11 DIAGNOSIS — R55 Syncope and collapse: Secondary | ICD-10-CM | POA: Diagnosis not present

## 2020-06-13 LAB — CUP PACEART REMOTE DEVICE CHECK
Date Time Interrogation Session: 20220402001910
Implantable Pulse Generator Implant Date: 20191029

## 2020-06-21 NOTE — Progress Notes (Signed)
[facility] Summary Report / Loop Recorder 

## 2020-07-03 ENCOUNTER — Telehealth: Payer: Self-pay | Admitting: Internal Medicine

## 2020-07-03 NOTE — Telephone Encounter (Signed)
R/s 5/10 appt due to provider PAL. Called and spoke with patient. Confirmed new date and time  

## 2020-07-10 ENCOUNTER — Inpatient Hospital Stay: Payer: Medicare Other | Attending: Internal Medicine

## 2020-07-10 ENCOUNTER — Other Ambulatory Visit: Payer: Self-pay

## 2020-07-10 DIAGNOSIS — Z923 Personal history of irradiation: Secondary | ICD-10-CM | POA: Diagnosis not present

## 2020-07-10 DIAGNOSIS — D472 Monoclonal gammopathy: Secondary | ICD-10-CM | POA: Diagnosis not present

## 2020-07-10 DIAGNOSIS — I10 Essential (primary) hypertension: Secondary | ICD-10-CM | POA: Diagnosis not present

## 2020-07-10 DIAGNOSIS — Z888 Allergy status to other drugs, medicaments and biological substances status: Secondary | ICD-10-CM | POA: Insufficient documentation

## 2020-07-10 DIAGNOSIS — Z8546 Personal history of malignant neoplasm of prostate: Secondary | ICD-10-CM | POA: Diagnosis not present

## 2020-07-10 DIAGNOSIS — Z79899 Other long term (current) drug therapy: Secondary | ICD-10-CM | POA: Diagnosis not present

## 2020-07-10 LAB — CMP (CANCER CENTER ONLY)
ALT: 17 U/L (ref 0–44)
AST: 26 U/L (ref 15–41)
Albumin: 3.6 g/dL (ref 3.5–5.0)
Alkaline Phosphatase: 86 U/L (ref 38–126)
Anion gap: 8 (ref 5–15)
BUN: 18 mg/dL (ref 8–23)
CO2: 26 mmol/L (ref 22–32)
Calcium: 9.2 mg/dL (ref 8.9–10.3)
Chloride: 106 mmol/L (ref 98–111)
Creatinine: 1.33 mg/dL — ABNORMAL HIGH (ref 0.61–1.24)
GFR, Estimated: 55 mL/min — ABNORMAL LOW (ref >60)
Glucose, Bld: 175 mg/dL — ABNORMAL HIGH (ref 70–99)
Potassium: 3.8 mmol/L (ref 3.5–5.1)
Sodium: 140 mmol/L (ref 135–145)
Total Bilirubin: 0.5 mg/dL (ref 0.3–1.2)
Total Protein: 7.1 g/dL (ref 6.5–8.1)

## 2020-07-10 LAB — CBC WITH DIFFERENTIAL (CANCER CENTER ONLY)
Abs Immature Granulocytes: 0.03 10*3/uL (ref 0.00–0.07)
Basophils Absolute: 0 10*3/uL (ref 0.0–0.1)
Basophils Relative: 0 %
Eosinophils Absolute: 0.1 10*3/uL (ref 0.0–0.5)
Eosinophils Relative: 2 %
HCT: 35.8 % — ABNORMAL LOW (ref 39.0–52.0)
Hemoglobin: 11.7 g/dL — ABNORMAL LOW (ref 13.0–17.0)
Immature Granulocytes: 0 %
Lymphocytes Relative: 24 %
Lymphs Abs: 1.9 10*3/uL (ref 0.7–4.0)
MCH: 30.7 pg (ref 26.0–34.0)
MCHC: 32.7 g/dL (ref 30.0–36.0)
MCV: 94 fL (ref 80.0–100.0)
Monocytes Absolute: 0.5 10*3/uL (ref 0.1–1.0)
Monocytes Relative: 6 %
Neutro Abs: 5.5 10*3/uL (ref 1.7–7.7)
Neutrophils Relative %: 68 %
Platelet Count: 157 10*3/uL (ref 150–400)
RBC: 3.81 MIL/uL — ABNORMAL LOW (ref 4.22–5.81)
RDW: 12.9 % (ref 11.5–15.5)
WBC Count: 8 10*3/uL (ref 4.0–10.5)
nRBC: 0 % (ref 0.0–0.2)

## 2020-07-10 LAB — LACTATE DEHYDROGENASE: LDH: 203 U/L — ABNORMAL HIGH (ref 98–192)

## 2020-07-11 LAB — BETA 2 MICROGLOBULIN, SERUM: Beta-2 Microglobulin: 2.4 mg/L (ref 0.6–2.4)

## 2020-07-11 LAB — IGG, IGA, IGM
IgA: 329 mg/dL (ref 61–437)
IgG (Immunoglobin G), Serum: 1017 mg/dL (ref 603–1613)
IgM (Immunoglobulin M), Srm: 79 mg/dL (ref 15–143)

## 2020-07-11 LAB — KAPPA/LAMBDA LIGHT CHAINS
Kappa free light chain: 63.4 mg/L — ABNORMAL HIGH (ref 3.3–19.4)
Kappa, lambda light chain ratio: 1.79 — ABNORMAL HIGH (ref 0.26–1.65)
Lambda free light chains: 35.4 mg/L — ABNORMAL HIGH (ref 5.7–26.3)

## 2020-07-11 LAB — PSA, TOTAL AND FREE
PSA, Free Pct: 13.8 %
PSA, Free: 0.11 ng/mL
Prostate Specific Ag, Serum: 0.8 ng/mL (ref 0.0–4.0)

## 2020-07-12 LAB — CUP PACEART REMOTE DEVICE CHECK
Date Time Interrogation Session: 20220505003748
Implantable Pulse Generator Implant Date: 20191029

## 2020-07-16 ENCOUNTER — Ambulatory Visit (INDEPENDENT_AMBULATORY_CARE_PROVIDER_SITE_OTHER): Payer: Medicare Other

## 2020-07-16 DIAGNOSIS — R55 Syncope and collapse: Secondary | ICD-10-CM

## 2020-07-17 ENCOUNTER — Ambulatory Visit: Payer: Medicare Other | Admitting: Internal Medicine

## 2020-07-25 ENCOUNTER — Inpatient Hospital Stay: Payer: Medicare Other | Admitting: Internal Medicine

## 2020-07-25 ENCOUNTER — Other Ambulatory Visit: Payer: Self-pay

## 2020-07-25 VITALS — BP 141/62 | HR 60 | Temp 97.3°F | Resp 20 | Ht 75.0 in | Wt 214.6 lb

## 2020-07-25 DIAGNOSIS — D472 Monoclonal gammopathy: Secondary | ICD-10-CM

## 2020-07-25 DIAGNOSIS — I10 Essential (primary) hypertension: Secondary | ICD-10-CM | POA: Diagnosis not present

## 2020-07-25 NOTE — Progress Notes (Signed)
Ricky Rowland:([phone]   Fax:([phone]) [phone] OFFICE PROGRESS NOTE  Ricky Carol, MD [address] [place] [address]  DIAGNOSIS:  1) Monoclonal gammopathy of undetermined significance (MGUS). 2) History of prostate cancer T1C followed by Dr. [name],  status post curative radiotherapy under the care of Dr. [name].  PRIOR THERAPY: Curative radiotherapy to the prostate under the care of Dr. [name].  CURRENT THERAPY: Observation.  INTERVAL HISTORY: Ricky Rowland Sr. 75 y.o. male returns to the clinic today for follow-up visit.  The patient is feeling fine today with no concerning complaints.  He mentions that few months ago he has 1 episode of staring and his wife was concerned about some seizure activity.  He was seen by his primary care physician for evaluation of this condition.  He is also scheduled to have repeat colonoscopy next month.  He denied having any chest pain, shortness of breath, cough or hemoptysis.  He denied having any fever or chills.  He has no nausea, vomiting, diarrhea or constipation.  He had repeat myeloma panel performed recently and he is here for evaluation and discussion of his lab results.  MEDICAL HISTORY: Past Medical History:  Diagnosis Date  . Borderline diabetes    DIET CONTROLLED  . Dyslipidemia   . Elevated PSA   . External carotid artery stenosis    MILD-- BILATERAL  . Fall 11/05/2017  . GERD (gastroesophageal reflux disease)   . Gout, arthritis   . Hearing loss in left ear   . Heart murmur    MILD --  ASYMPTOMATIC  . History of gastric ulcer    REMOTE HX  . Hypertension   . Pre-diabetes   . Prostate cancer ([place]) 12/21/12   Gleason 3+4=7  . Wears glasses     ALLERGIES:  is allergic to statins.  MEDICATIONS:  Current Outpatient Medications  Medication Sig Dispense Refill  . acetaminophen (TYLENOL) 650 MG CR tablet Take 650 mg by mouth every 8 (eight) hours.    Marland Kitchen allopurinol  (ZYLOPRIM) 300 MG tablet Take 300 mg by mouth daily.     Marland Kitchen amLODipine (NORVASC) 5 MG tablet Take 5 mg by mouth daily.  1  . colchicine 0.6 MG tablet Take 0.6 mg by mouth 2 (two) times daily as needed (for gout).     . Naphazoline HCl (CLEAR EYES OP) Place 1 drop into both eyes daily as needed (dry eyes).     Marland Kitchen omeprazole (PRILOSEC) 20 MG capsule Take 20 mg by mouth daily.    Marland Kitchen REPATHA SURECLICK 811 MG/ML SOAJ Inject 140 mg into the skin every 14 (fourteen) days.     Marland Kitchen tolnaftate (TINACTIN) 1 % cream Apply 1 application topically daily as needed (jock itch).    . valsartan (DIOVAN) 320 MG tablet Take 320 mg by mouth daily.  0   No current facility-administered medications for this visit.    SURGICAL HISTORY:  Past Surgical History:  Procedure Laterality Date  . KNEE ARTHROSCOPY W/ MENISCAL REPAIR Left JAN 2014  . LOOP RECORDER INSERTION N/A 01/05/2018   Procedure: LOOP RECORDER INSERTION;  Surgeon: [name], MD;  Location: [place] [facility];  Service: Cardiovascular;  Laterality: N/A;  . LUMBAR SPINE SURGERY  1991  . PROSTATE BIOPSY N/A 12/21/2012   Procedure: BIOPSY TRANSRECTAL ULTRASONIC PROSTATE (TUBP);  Surgeon: [name], MD;  Location: [facility];  Service: Urology;  Laterality: N/A;  . PROSTATE BIOPSY  11/26/11   benign    REVIEW OF SYSTEMS:  A comprehensive review of systems was negative.   PHYSICAL EXAMINATION: General appearance: alert, cooperative and no distress Head: Normocephalic, without obvious abnormality, atraumatic Neck: no adenopathy, no JVD, supple, symmetrical, trachea midline and thyroid not enlarged, symmetric, no tenderness/mass/nodules Lymph nodes: Cervical, supraclavicular, and axillary nodes normal. Resp: clear to auscultation bilaterally Back: symmetric, no curvature. ROM normal. No CVA tenderness. Cardio: regular rate and rhythm, S1, S2 normal, no murmur, click, rub or gallop GI: soft, non-tender; bowel sounds normal; no  masses,  no organomegaly Extremities: extremities normal, atraumatic, no cyanosis or edema  ECOG PERFORMANCE STATUS: 1 - Symptomatic but completely ambulatory  Blood pressure (!) 141/62, pulse 60, temperature (!) 97.3 F (36.3 C), temperature source Tympanic, resp. rate 20, height 6\' 3"  (1.905 m), weight 214 lb 9.6 oz (97.3 kg), SpO2 99 %.  LABORATORY DATA: Lab Results  Component Value Date   WBC 8.0 07/10/2020   HGB 11.7 (L) 07/10/2020   HCT 35.8 (L) 07/10/2020   MCV 94.0 07/10/2020   PLT 157 07/10/2020      Chemistry      Component Value Date/Time   NA 140 07/10/2020 1145   NA 140 06/18/2016 0857   K 3.8 07/10/2020 1145   K 4.2 06/18/2016 0857   CL 106 07/10/2020 1145   CO2 26 07/10/2020 1145   CO2 25 06/18/2016 0857   BUN 18 07/10/2020 1145   BUN 14.7 06/18/2016 0857   CREATININE 1.33 (H) 07/10/2020 1145   CREATININE 1.1 06/18/2016 0857      Component Value Date/Time   CALCIUM 9.2 07/10/2020 1145   CALCIUM 10.1 06/18/2016 0857   ALKPHOS 86 07/10/2020 1145   ALKPHOS 79 06/18/2016 0857   AST 26 07/10/2020 1145   AST 43 (H) 06/18/2016 0857   ALT 17 07/10/2020 1145   ALT 41 06/18/2016 0857   BILITOT 0.5 07/10/2020 1145   BILITOT 0.73 06/18/2016 0857     [place] lab results LTS FINAL DIAGNOSIS ASSESSMENT AND PLAN:  This is a very pleasant 75 years old African-American male with monoclonal gammopathy of undetermined significance and has been observation for several years.  The patient is doing fine with no concerning complaints. Repeat myeloma panel showed no concerning findings except for mild.  He is scheduled to have repeat colonoscopy next months. Regarding the history of prostate cancer he is followed by his primary care physician. I recommended for the patient to continue on observation with repeat myeloma panel in 1 year.  He was advised to call immediately if he has any other concerning symptoms in the interval. The patient voices understanding of current  disease status and treatment options and is in agreement with the current care plan. All questions were answered. The patient knows to call the clinic with any problems, questions or concerns. We can certainly see the patient much sooner if necessary.   Disclaimer: This note was dictated with voice recognition software. Similar sounding words can inadvertently be transcribed and may not be corrected upon review.

## 2020-07-26 ENCOUNTER — Telehealth: Payer: Self-pay | Admitting: Internal Medicine

## 2020-07-26 NOTE — Telephone Encounter (Signed)
Scheduled per los. Called and left msg. Mailed printout  °

## 2020-07-27 NOTE — Progress Notes (Signed)
Cardiology Office Note:    Date:  07/30/2020   ID:  Ricky Beckles., DOB 11-24-1945, MRN [id number]  PCP:  [name], MD  Cardiologist:  Jenkins Rouge, MD  Referring MD: [name], MD   No chief complaint on file.   History of Present Illness:    75 y.o. with long standing history of dizziness. PMH HTN, HLD intolerant to statins on repatha, prostate cancer and diet controlled DM. History of 47-82% LICA stenosis due for f/u duplex Has seen Dr Caryl Comes ILR  Implant 01/05/18 with no PAF. TTE 11/06/17 EF 50-55% mild LVH no valve disease  He was drinking about 3 vodka and tonic drinks every evening prior to this fall. He has stopped all alcohol for now.  Stopped smoking in 1999  Carotid 09/21/19 stable left ICA 40-59%   Retired from [employer] and helping to [employer] to golf but has not done so since March Wife is at home and healthy   BP runs normal at home despite being higher in office today. He is interested in having his ILR removed   Past Medical History:  Diagnosis Date  . Borderline diabetes    DIET CONTROLLED  . Dyslipidemia   . Elevated PSA   . External carotid artery stenosis    MILD-- BILATERAL  . Fall 11/05/2017  . GERD (gastroesophageal reflux disease)   . Gout, arthritis   . Hearing loss in left ear   . Heart murmur    MILD --  ASYMPTOMATIC  . History of gastric ulcer    REMOTE HX  . Hypertension   . Pre-diabetes   . Prostate cancer ([place]) 12/21/12   Gleason 3+4=7  . Wears glasses     Past Surgical History:  Procedure Laterality Date  . KNEE ARTHROSCOPY W/ MENISCAL REPAIR Left JAN 2014  . LOOP RECORDER INSERTION N/A 01/05/2018   Procedure: LOOP RECORDER INSERTION;  Surgeon: [name], MD;  Location: [place] [facility];  Service: Cardiovascular;  Laterality: N/A;  . LUMBAR SPINE SURGERY  1991  . PROSTATE BIOPSY N/A 12/21/2012   Procedure: BIOPSY TRANSRECTAL ULTRASONIC PROSTATE (TUBP);  Surgeon: [name], MD;   Location: [facility];  Service: Urology;  Laterality: N/A;  . PROSTATE BIOPSY  11/26/11   benign    Current Medications: Current Meds  Medication Sig  . acetaminophen (TYLENOL) 650 MG CR tablet Take 650 mg by mouth every 8 (eight) hours.  Marland Kitchen allopurinol (ZYLOPRIM) 300 MG tablet Take 300 mg by mouth daily.   Marland Kitchen amLODipine (NORVASC) 5 MG tablet Take 5 mg by mouth daily.  . colchicine 0.6 MG tablet Take 0.6 mg by mouth 2 (two) times daily as needed (for gout).   . Naphazoline HCl (CLEAR EYES OP) Place 1 drop into both eyes daily as needed (dry eyes).   Marland Kitchen omeprazole (PRILOSEC) 20 MG capsule Take 20 mg by mouth daily.  Marland Kitchen REPATHA SURECLICK 956 MG/ML SOAJ Inject 140 mg into the skin every 14 (fourteen) days.   Marland Kitchen tolnaftate (TINACTIN) 1 % cream Apply 1 application topically daily as needed (jock itch).  . valsartan (DIOVAN) 320 MG tablet Take 320 mg by mouth daily.     Allergies:   Ezetimibe, Other, and Statins   Social History   Socioeconomic History  . Marital status: Married    Spouse name: geraldine  . Number of children: 3  . Years of education: some college  . Highest education level: Not on file  Occupational  History  . Occupation: retired  Tobacco Use  . Smoking status: Former Smoker    Packs/day: 1.00    Years: 30.00    Pack years: 30.00    Types: Cigarettes    Quit date: 12/15/1997    Years since quitting: 22.6  . Smokeless tobacco: Never Used  Vaping Use  . Vaping Use: Never used  Substance and Sexual Activity  . Alcohol use: Yes    Alcohol/week: 20.0 standard drinks    Types: 20 Shots of liquor per week    Comment: OCCASIONAL, 12/28/17 stopped 11/05/17  . Drug use: No  . Sexual activity: Not on file  Other Topics Concern  . Not on file  Social History Narrative   Lives with wife at home    caffeine use- occas, mostly decaf coffee   Social Determinants of Health   Financial Resource Strain: Not on file  Food Insecurity: Not on file   Transportation Needs: Not on file  Physical Activity: Not on file  Stress: Not on file  Social Connections: Not on file     Family History: The patient's family history includes Cancer in his brother and sister; Heart disease in his father and mother. ROS:   Please see the history of present illness.     All other systems reviewed and are negative.  EKGs/Labs/Other Studies Reviewed:    The following studies were reviewed today:  Echocardiogram 11/06/2017 Study Conclusions - Left ventricle: The cavity size was normal. Wall thickness was   increased in a pattern of mild LVH. Systolic function was normal.   The estimated ejection fraction was in the range of 50% to 55%.   Wall motion was normal; there were no regional wall motion   abnormalities. - Aortic valve: Transvalvular velocity was within the normal range.   There was no stenosis. There was no significant regurgitation. - Mitral valve: There was trivial regurgitation. Valve area by   pressure half-time: 2.37 cm^2. - Left atrium: The atrium was mildly to moderately dilated. - Right ventricle: Systolic function was normal. - Atrial septum: No defect or patent foramen ovale was identified. - Tricuspid valve: There was mild regurgitation. - Pulmonic valve: There was no significant regurgitation. - Pulmonary arteries: Systolic pressure was mildly increased. PA   peak pressure: 35 mm Hg (S).  Impressions: - Normal LV EF, no significant valvular disease.  Carotid ultrasound 09/21/19  Final Interpretation: -Right Carotid: Velocities in the right ICA are consistent with a 1-39% stenosis. -Left Carotid: Velocities in the left ICA are consistent with a 40-59% stenosis.  EKG:   12/24/17   SR rate 57 LVH no AV block  04/05/18 SR rate 68 LAD otherwise normal   Recent Labs: 07/10/2020: ALT 17; BUN 18; Creatinine 1.33; Hemoglobin 11.7; Platelet Count 157; Potassium 3.8; Sodium 140   Recent Lipid Panel    Component Value Date/Time    CHOL 168 11/06/2017 0524   TRIG 166 (H) 11/06/2017 0524   HDL 68 11/06/2017 0524   CHOLHDL 2.5 11/06/2017 0524   VLDL 33 11/06/2017 0524   LDLCALC 67 11/06/2017 0524    Physical Exam:    BP (!) 156/70   Pulse (!) 53   Ht 6\' 3"  (1.905 m)   Wt 95.7 kg   SpO2 99%   BMI 26.37 kg/m   Affect appropriate Healthy:  appears stated age HEENT: normal Neck supple with no adenopathy JVP normal left bruits no thyromegaly Lungs clear with no wheezing and good diaphragmatic motion Heart:  S1/S2 no murmur, no rub, gallop or click PMI normal Abdomen: benighn, BS positve, no tenderness, no AAA no bruit.  No HSM or HJR Distal pulses intact with no bruits No edema Neuro non-focal Skin warm and dry No muscular weakness   ASSESSMENT:    Pre Syncope  PLAN:    In order of problems listed above:  Dizziness/near syncope: non cardiac appearing ILR with no PAF / arrhythmia reviewed most recent PACEART transmission 07/12/20 TTE No structural heart disease and ECG stable only LAFB Has seen Dr  Caryl Comes EP not thought to be cardiac ? Neurally mediated He Is interested in having his ILR removed and I will make F/u appointment with [facility] to discuss  Carotid stenosis:  Duplex 9/79/89 21-19% LICA stenosis ordered duplex for July 2022 ASA intolerant to statins no recent lipids on file now on Repatha   Hypertension: stable on diovan and norvasc f/u Dr Delfina Redwood   MGUS:  F/u DR [facility] followed for years myeloma panel negative   Prostate Cancer:  Curative XRT f/u Dr [name] PSA normal 07/10/20   Medication Adjustments/Labs and Tests Ordered:  Carotid Duplex July 2022 F/U cardiology in a year   Signed, Jenkins Rouge, MD  07/30/2020 9:23 AM    [place]

## 2020-07-30 ENCOUNTER — Encounter: Payer: Self-pay | Admitting: Cardiovascular Disease

## 2020-07-30 ENCOUNTER — Ambulatory Visit: Payer: Medicare Other | Admitting: Cardiovascular Disease

## 2020-07-30 ENCOUNTER — Other Ambulatory Visit: Payer: Self-pay

## 2020-07-30 VITALS — BP 156/70 | HR 53 | Ht 75.0 in | Wt 211.0 lb

## 2020-07-30 DIAGNOSIS — R42 Dizziness and giddiness: Secondary | ICD-10-CM | POA: Diagnosis not present

## 2020-07-30 DIAGNOSIS — I10 Essential (primary) hypertension: Secondary | ICD-10-CM | POA: Diagnosis not present

## 2020-07-30 DIAGNOSIS — D472 Monoclonal gammopathy: Secondary | ICD-10-CM | POA: Diagnosis not present

## 2020-07-30 DIAGNOSIS — R0989 Other specified symptoms and signs involving the circulatory and respiratory systems: Secondary | ICD-10-CM

## 2020-07-30 DIAGNOSIS — C61 Malignant neoplasm of prostate: Secondary | ICD-10-CM

## 2020-07-30 NOTE — Patient Instructions (Signed)
Medication Instructions:  NO CHANGES *If you need a refill on your cardiac medications before your next appointment, please call your pharmacy*   Lab Work: NONE If you have labs (blood work) drawn today and your tests are completely normal, you will receive your results only by: Marland Kitchen MyChart Message (if you have MyChart) OR . A paper copy in the mail If you have any lab test that is abnormal or we need to change your treatment, we will call you to review the results.   Testing/Procedures: Your physician has requested that you have a carotid duplex. This test is an ultrasound of the carotid arteries in your neck. It looks at blood flow through these arteries that supply the brain with blood. Allow one hour for this exam. There are no restrictions or special instructions.  DUE IN July  LEFT SIDED STENOSIS   Follow-Up: At [facility], you and your health needs are our priority.  As part of our continuing mission to provide you with exceptional heart care, we have created designated Provider Care Teams.  These Care Teams include your primary Cardiologist (physician) and Advanced Practice Providers (APPs -  Physician Assistants and Nurse Practitioners) who all work together to provide you with the care you need, when you need it.  We recommend signing up for the patient portal called "MyChart".  Sign up information is provided on this After Visit Summary.  MyChart is used to connect with patients for Virtual Visits (Telemedicine).  Patients are able to view lab/test results, encounter notes, upcoming appointments, etc.  Non-urgent messages can be sent to your provider as well.   To learn more about what you can do with MyChart, go to [email or web address].    Your next appointment:   12 month(s)  The format for your next appointment:   In Person  Provider:   Jenkins Rouge, MD   Other Instructions NEEDS TO SEE DR Caryl Comes NEXT AVAILABLE TO DISCUSS REMOVAL OF LOOP RECORDER

## 2020-08-03 NOTE — Progress Notes (Signed)
[facility] Summary Report / Loop Recorder 

## 2020-08-16 LAB — CUP PACEART REMOTE DEVICE CHECK
Date Time Interrogation Session: 20220607005616
Implantable Pulse Generator Implant Date: 20191029

## 2020-08-20 ENCOUNTER — Ambulatory Visit (INDEPENDENT_AMBULATORY_CARE_PROVIDER_SITE_OTHER): Payer: Medicare Other

## 2020-08-20 DIAGNOSIS — R55 Syncope and collapse: Secondary | ICD-10-CM | POA: Diagnosis not present

## 2020-09-08 NOTE — Progress Notes (Signed)
[facility] Summary Report / Loop Recorder 

## 2020-09-12 ENCOUNTER — Encounter: Payer: Self-pay | Admitting: *Deleted

## 2020-09-17 ENCOUNTER — Ambulatory Visit: Payer: Medicare Other | Admitting: Diagnostic Neuroimaging

## 2020-09-17 ENCOUNTER — Other Ambulatory Visit: Payer: Self-pay

## 2020-09-17 ENCOUNTER — Encounter: Payer: Self-pay | Admitting: Diagnostic Neuroimaging

## 2020-09-17 VITALS — BP 145/75 | HR 61 | Ht 75.0 in | Wt 202.0 lb

## 2020-09-17 DIAGNOSIS — R4689 Other symptoms and signs involving appearance and behavior: Secondary | ICD-10-CM

## 2020-09-17 NOTE — Progress Notes (Signed)
[facility]  PATIENT: Ricky LEACH Sr. DOB: 11-Feb-1946  REFERRING CLINICIAN: Polite HISTORY FROM: patient and chart review  REASON FOR VISIT: follow up   HISTORICAL  CHIEF COMPLAINT:  Chief Complaint  Patient presents with   New Patient (Initial Visit)    Rm 7 alone- Last visit at [facility] was in 2019. Pt sts on March 28th wife reports she witnessed the pt having a seizure like event. Wife told the pt for 15-20 seconds he went ridged while sitting at the counter in the kitchen and he would not respond when she called his name. Since Marh, pt denies any similar events.    HISTORY OF PRESENT ILLNESS:   UPDATE (09/17/20, VRP): Since last visit, patient has stopped drinking alcohol as of May 08, 2020.  On June 04, 2020 patient was at home when all of a sudden he was staring and unresponsive at the kitchen table.  This lasted for about 15 to 20 seconds.  This was noticed by wife.  No convulsions.  Patient did not blackout or collapse.  He was sort of  frozen for moment. When he woke up he was not aware of the event.   UPDATE (12/28/17, VRP): Since last visit, here for recurrent attacks of lightheadedness and syncope. 11/05/17, had unprovoked syncope. Had prodrome lightheaded feeling, then fell to ground. Woke up in a few seconds, and was able to call his wife to help. He went to ER and was admitted. Workup negative. Now has stopped etoh use (previously 2-4 drinks per day consistently).   UPDATE 11/13/15: Since last visit, doing well. Saw cardiology, who referred pt to vestibular PT, who found orthostatic hypotension (SBP 156 --> 99). Now BP better controlled / regulated, and patient not having any more events.   PRIOR HPI (09/13/15): 75 year old male with hypertension, hypercholesteremia, prostate cancer, here for evaluation of dizziness attacks. Symptoms started in 2014. Patient has 8 to 12 episodes per year. No specific triggering factors. Patient had one attack last week  and another one the week before. Previously last attack was in November 2016. Patient describes a sensation of intestine, stomach upset sensation, overheating sensation, severe sweats, lightheadedness and nausea. Sometimes patient vomits. Patient feels off balance during these attacks. No unilateral numbness, tingling, slurred speech, double vision or headaches with the attacks. Patient typically tries to make it home, lay down and go to sleep. If patient vomits or has a bowel movement after the tachycardia started, seems to relieve the sensation and symptom. If patient is laying flat after an attack, if he tries to sit up symptoms are exacerbated. Patient has never measured his blood pressure during these attacks. Patient has got to the emergency room for several of these attacks while in the emergency room hypotension has not been documented. Patient denies any palpitations, chest pain, shortness of breath. Episodes of occurred in standing or sitting position. Episodes of occurred at home [place] well [place]. No specific triggering factors. In the past patient has had MRI of the brain, cardiac testing, heart monitor, nuclear medicine stress test, which appeared unremarkable. These testing were done 2-3 years ago.   REVIEW OF SYSTEMS: Full 14 system review of systems performed and negative except as per HPI.    ALLERGIES: Allergies  Allergen Reactions   Ezetimibe Other (See Comments)    Muscle aches   Statins Other (See Comments)    Joint pain    HOME MEDICATIONS: Outpatient Medications Prior to Visit  Medication  Sig Dispense Refill   acetaminophen (TYLENOL) 650 MG CR tablet Take 650 mg by mouth every 8 (eight) hours.     allopurinol (ZYLOPRIM) 300 MG tablet Take 300 mg by mouth daily.      amLODipine (NORVASC) 5 MG tablet Take 5 mg by mouth daily.  1   colchicine 0.6 MG tablet Take 0.6 mg by mouth 2 (two) times daily as needed (for gout).      Lidocaine 0.5 % AERO Apply topically.      Naphazoline HCl (CLEAR EYES OP) Place 1 drop into both eyes daily as needed (dry eyes).      omeprazole (PRILOSEC) 20 MG capsule Take 20 mg by mouth daily.     REPATHA SURECLICK 846 MG/ML SOAJ Inject 140 mg into the skin every 14 (fourteen) days.      tolnaftate (TINACTIN) 1 % cream Apply 1 application topically daily as needed (jock itch).     valsartan (DIOVAN) 320 MG tablet Take 320 mg by mouth daily.  0   No facility-administered medications prior to visit.    PAST MEDICAL HISTORY: Past Medical History:  Diagnosis Date   Borderline diabetes    DIET CONTROLLED   Dyslipidemia    Elevated PSA    External carotid artery stenosis    MILD-- BILATERAL   Fall 11/05/2017   GERD (gastroesophageal reflux disease)    Gout, arthritis    Hearing loss in left ear    Heart murmur    MILD --  ASYMPTOMATIC   History of gastric ulcer    REMOTE HX   Hypertension    Pre-diabetes    Prostate cancer ([facility]) 12/21/2012   Gleason 3+4=7   Seizure-like activity (HCC)    Syncope and collapse    Wears glasses     PAST SURGICAL HISTORY: Past Surgical History:  Procedure Laterality Date   KNEE ARTHROSCOPY W/ MENISCAL REPAIR Left 03/10/2012   LOOP RECORDER INSERTION N/A 01/05/2018   Procedure: LOOP RECORDER INSERTION;  Surgeon: [name], MD;  Location: [place];  Service: Cardiovascular;  Laterality: N/A;   LUMBAR SPINE SURGERY  03/10/1989   PROSTATE BIOPSY N/A 12/21/2012   Procedure: BIOPSY TRANSRECTAL ULTRASONIC PROSTATE (TUBP);  Surgeon: [name], MD;  Location[facility];  Service: Urology;  Laterality: N/A;   PROSTATE BIOPSY  11/26/2011   benign   TONSILLECTOMY     WISDOM TOOTH EXTRACTION      FAMILY HISTORY: Family History  Problem Relation Age of Onset   Heart disease Mother    Heart disease Father    Cancer Sister        lung   Cancer Brother        prostate    SOCIAL HISTORY:  Social History   Socioeconomic History   Marital  status: Married    Spouse name: [facility]   Number of children: 3   Years of education: some college   Highest education level: Not on file  Occupational History   Occupation: retired  Tobacco Use   Smoking status: Former    Packs/day: 1.00    Years: 30.00    Pack years: 30.00    Types: Cigarettes    Quit date: 12/15/1997    Years since quitting: 22.7   Smokeless tobacco: Never  Vaping Use   Vaping Use: Never used  Substance and Sexual Activity   Alcohol use: Yes    Alcohol/week: 20.0 standard drinks    Types: 20 Shots of liquor per  week    Comment: OCCASIONAL, 12/28/17 stopped 11/05/17   Drug use: No   Sexual activity: Not on file  Other Topics Concern   Not on file  Social History Narrative   Lives with wife at home    caffeine use- occas, mostly decaf coffee   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    Social Determinants of Health   Financial Resource Strain: Not on file  Food Insecurity: Not on file  Transportation Needs: Not on file  Physical Activity: Not on file  Stress: Not on file  Social Connections: Not on file  Intimate Partner Violence: Not on file     PHYSICAL EXAM  GENERAL EXAM/CONSTITUTIONAL: Vitals:  Vitals:   09/17/20 1321  BP: (!) 145/75  Pulse: 61  Weight: 202 lb (91.6 kg)  Height: 6\' 3"  (1.905 m)    No data found.   Body mass index is 25.25 kg/m. No results found. Patient is in no distress; well developed, nourished and groomed; neck is supple  CARDIOVASCULAR: Examination of carotid arteries is normal; no carotid bruits Regular rate and rhythm, no murmurs Examination of peripheral vascular system by observation and palpation is normal  EYES: Ophthalmoscopic exam of optic discs and posterior segments is normal; no papilledema or hemorrhages  MUSCULOSKELETAL: Gait, strength, tone, movements noted in Neurologic exam below  NEUROLOGIC: MENTAL STATUS:  No flowsheet data found. awake, alert, oriented to person, place and time recent and remote memory  intact normal attention and concentration language fluent, comprehension intact, naming intact,  fund of knowledge appropriate  CRANIAL NERVE:  2nd - no papilledema on fundoscopic exam 2nd, 3rd, 4th, 6th - pupils equal and reactive to light, visual fields full to confrontation, extraocular muscles intact, no nystagmus 5th - facial sensation symmetric 7th - facial strength symmetric 8th - hearing intact 9th - palate elevates symmetrically, uvula midline 11th - shoulder shrug symmetric 12th - tongue protrusion midline  MOTOR:  normal bulk and tone, full strength in the BUE, BLE  SENSORY:  normal and symmetric to light touch, temperature, vibration  COORDINATION:  finger-nose-finger, fine finger movements normal  REFLEXES:  deep tendon reflexes present and symmetric  GAIT/STATION:  narrow based gait; romberg is negative    DIAGNOSTIC DATA (LABS, IMAGING, TESTING) - I reviewed patient records, labs, notes, testing and imaging myself where available.  Lab Results  Component Value Date   WBC 8.0 07/10/2020   HGB 11.7 (L) 07/10/2020   HCT 35.8 (L) 07/10/2020   MCV 94.0 07/10/2020   PLT 157 07/10/2020      Component Value Date/Time   NA 140 07/10/2020 1145   NA 140 06/18/2016 0857   K 3.8 07/10/2020 1145   K 4.2 06/18/2016 0857   CL 106 07/10/2020 1145   CO2 26 07/10/2020 1145   CO2 25 06/18/2016 0857   GLUCOSE 175 (H) 07/10/2020 1145   GLUCOSE 126 06/18/2016 0857   BUN 18 07/10/2020 1145   BUN 14.7 06/18/2016 0857   CREATININE 1.33 (H) 07/10/2020 1145   CREATININE 1.1 06/18/2016 0857   CALCIUM 9.2 07/10/2020 1145   CALCIUM 10.1 06/18/2016 0857   PROT 7.1 07/10/2020 1145   PROT 7.8 06/18/2016 0857   ALBUMIN 3.6 07/10/2020 1145   ALBUMIN 4.0 06/18/2016 0857   AST 26 07/10/2020 1145   AST 43 (H) 06/18/2016 0857   ALT 17 07/10/2020 1145   ALT 41 06/18/2016 0857   ALKPHOS 86 07/10/2020 1145   ALKPHOS 79 06/18/2016 0857   BILITOT 0.5 07/10/2020 1145  BILITOT  0.73 06/18/2016 0857   GFRNONAA 55 (L) 07/10/2020 1145   GFRAA >60 07/11/2019 1031   Lab Results  Component Value Date   CHOL 168 11/06/2017   HDL 68 11/06/2017   LDLCALC 67 11/06/2017   TRIG 166 (H) 11/06/2017   CHOLHDL 2.5 11/06/2017   Lab Results  Component Value Date   HGBA1C 5.0 11/06/2017   No results found for: [id number] Lab Results  Component Value Date   TSH 0.800 11/05/2017    01/10/13 nuclear stress test 1.  No fixed or reversible defects to suggest inducible ischemia. 2. Normal left ventricular wall motion and thickening. 3. Normal ejection fraction.  02/03/15 MRI brain (with and without) [I reviewed images myself and agree with interpretation. -VRP]  1. No acute intracranial abnormality or mass. 2. Chronic right basal ganglia lacunar infarcts. 3. Mild cerebral and cerebellar atrophy.  09/07/15 EKG [I reviewed images myself and agree with interpretation. -VRP]  - normal sinus rhythm  10/23/15 EEG  - normal  11/06/17 carotid u/s - Right Carotid: Velocities in the right ICA are consistent with a 1-39% stenosis. - Left Carotid: Velocities in the left ICA are consistent with a 40-59% stenosis.     ASSESSMENT AND PLAN  75 y.o. year old male here with intermittent unprovoked episodes since 2014 of lightheadedness, presyncope, nausea, diaphoresis. Findings suspicious for generalized hypotension / hypoperfusion events.    Dx: recurrent syncope / transient hypotension  1. Spell of abnormal behavior       PLAN:  ABNORMAL SPELL / UNRESPONSIVE SPELL (06/04/20; also had stopped etoh completely on 05/08/20) - possible new onset seizure; check MRI brain and EEG  - According to [place] law, you can not drive unless you are seizure / syncope free for at least 6 months and under physician's care.   - Please maintain precautions. Do not participate in activities where a loss of awareness could harm you or someone else. No swimming alone, no tub bathing, no hot tubs, no  driving, no operating motorized vehicles (cars, ATVs, motocycles, etc), lawnmowers, power tools or firearms. No standing at heights, such as rooftops, ladders or stairs. Avoid hot objects such as stoves, heaters, open fires. Wear a helmet when riding a bicycle, scooter, skateboard, etc. and avoid areas of traffic. Set your water heater to 120 degrees or less.    RECURRENT SYNCOPE (last event 2019) - hydration - continue alcohol abstinence - monitor BP at home - follow up with cardiology    LEFT CAROTID STENOSIS (asymptomatic) - vascular risk factor mgmt - follow up with vascular sx clinic  Orders Placed This Encounter  Procedures   MR BRAIN W WO CONTRAST   EEG adult   Return for pending test results, pending if symptoms worsen or fail to improve.    Penni Bombard, MD 1/54/0086, 7:61 PM Certified in Neurology, Neurophysiology and Neuroimaging  [facility] [address], [place], [place] [address] [phone]

## 2020-09-17 NOTE — Patient Instructions (Signed)
  ABNORMAL SPELL / UNRESPONSIVE SPELL (06/04/20; also had stopped alcohol completely on 05/08/20)\  - possible new onset seizure; check MRI brain and EEG  - According to [place] law, you can not drive unless you are seizure / syncope free for at least 6 months and under physician's care.   - Please maintain precautions. Do not participate in activities where a loss of awareness could harm you or someone else. No swimming alone, no tub bathing, no hot tubs, no driving, no operating motorized vehicles (cars, ATVs, motocycles, etc), lawnmowers, power tools or firearms. No standing at heights, such as rooftops, ladders or stairs. Avoid hot objects such as stoves, heaters, open fires. Wear a helmet when riding a bicycle, scooter, skateboard, etc. and avoid areas of traffic. Set your water heater to 120 degrees or less.

## 2020-09-19 LAB — CUP PACEART REMOTE DEVICE CHECK
Date Time Interrogation Session: 20220710005456
Implantable Pulse Generator Implant Date: 20191029

## 2020-09-20 ENCOUNTER — Other Ambulatory Visit: Payer: Self-pay

## 2020-09-20 ENCOUNTER — Ambulatory Visit (HOSPITAL_COMMUNITY)
Admission: RE | Admit: 2020-09-20 | Discharge: 2020-09-20 | Disposition: A | Discharge status: Home / Self Care | Payer: Medicare Other | Source: Ambulatory Visit | Attending: Cardiovascular Disease | Admitting: Cardiovascular Disease

## 2020-09-20 DIAGNOSIS — R0989 Other specified symptoms and signs involving the circulatory and respiratory systems: Secondary | ICD-10-CM | POA: Insufficient documentation

## 2020-09-24 ENCOUNTER — Ambulatory Visit (INDEPENDENT_AMBULATORY_CARE_PROVIDER_SITE_OTHER): Payer: Medicare Other

## 2020-09-24 DIAGNOSIS — R55 Syncope and collapse: Secondary | ICD-10-CM

## 2020-10-01 ENCOUNTER — Telehealth: Payer: Self-pay | Admitting: Diagnostic Neuroimaging

## 2020-10-01 NOTE — Telephone Encounter (Signed)
Pt called wanting to know when his EEG will be scheduled. Please advise.

## 2020-10-01 NOTE — Telephone Encounter (Signed)
EEG order sent to [facility]. They will call patient to schedule.

## 2020-10-10 ENCOUNTER — Other Ambulatory Visit: Payer: Self-pay

## 2020-10-10 ENCOUNTER — Ambulatory Visit: Payer: Medicare Other | Admitting: Diagnostic Neuroimaging

## 2020-10-10 DIAGNOSIS — R55 Syncope and collapse: Secondary | ICD-10-CM

## 2020-10-10 DIAGNOSIS — R4689 Other symptoms and signs involving appearance and behavior: Secondary | ICD-10-CM

## 2020-10-10 NOTE — Progress Notes (Signed)
EEG completed at [facility]. Full report to follow by Dr. Leta Baptist

## 2020-10-15 DIAGNOSIS — Z9889 Other specified postprocedural states: Secondary | ICD-10-CM | POA: Insufficient documentation

## 2020-10-16 NOTE — Progress Notes (Signed)
[facility] Summary Report / Loop Recorder 

## 2020-10-18 ENCOUNTER — Other Ambulatory Visit: Payer: Self-pay

## 2020-10-18 ENCOUNTER — Ambulatory Visit: Payer: Medicare Other | Admitting: Internal Medicine

## 2020-10-18 DIAGNOSIS — Z9889 Other specified postprocedural states: Secondary | ICD-10-CM | POA: Diagnosis not present

## 2020-10-18 NOTE — Progress Notes (Signed)
Patient ID: Ricky Prier., male   DOB: June 12, 1945, 75 y.o.   MRN: [id number]      Patient Care Team: [name], MD as PCP - General (Internal Medicine) Josue Hector, MD as PCP - Cardiology (Cardiology)   HPI  Ricky Edinger Sr. is a 75 y.o. male Seen in followup with ILR implanted for recurrent presyncope and syncope with two distinct syndromes  recent  Today, the patient denies chest pain,nocturnal dyspnea, orthopnea or peripheral edema.  There have been no palpitations,or syncope.   Episodes of lightheadedness occurs while standing and seated   if while standing, it is not relieved by sitting, and if while seated, it is not relieved by lying down, not provoked by turning of the head; unassociated with sense of the world spinning, with out nausea and vomiting. Not Associated with palpitations.     Intermittent dyspnea assoc with exertion, > 100 yds.  Shortness of breath is much better at this point; not quite sure why 90s.     March 28-2022 he was in the kitchen with his wife , had a spell of unresponsiveness, unassociated with loss of posture.  She is concerned   he was having a seizure that lasted approximately 30-40 seconds; he has been seen by neurology with ongoing evaluation.  EEG pending  He denies olfactory and visual hallucinations       Date Cr K Hgb  8/19 1.20 3.9 14.3  4/20 1.27 4.3 15.0  5/21 1.27 4.9 13.5  5/22 1.33 3.8 11.7    DATE TEST EF%   11/14 MRI   %   8/19 Echo  50-55 %         Records and Results Reviewed   Past Medical History:  Diagnosis Date   Borderline diabetes    DIET CONTROLLED   Dyslipidemia    Elevated PSA    External carotid artery stenosis    MILD-- BILATERAL   Fall 11/05/2017   GERD (gastroesophageal reflux disease)    Gout, arthritis    Hearing loss in left ear    Heart murmur    MILD --  ASYMPTOMATIC   History of gastric ulcer    REMOTE HX   Hypertension    Pre-diabetes    Prostate cancer ([place])  12/21/2012   Gleason 3+4=7   Seizure-like activity (HCC)    Syncope and collapse    Wears glasses     Past Surgical History:  Procedure Laterality Date   KNEE ARTHROSCOPY W/ MENISCAL REPAIR Left 03/10/2012   LOOP RECORDER INSERTION N/A 01/05/2018   Procedure: LOOP RECORDER INSERTION;  Surgeon: [name], MD;  Location: [place] [facility];  Service: Cardiovascular;  Laterality: N/A;   LUMBAR SPINE SURGERY  03/10/1989   PROSTATE BIOPSY N/A 12/21/2012   Procedure: BIOPSY TRANSRECTAL ULTRASONIC PROSTATE (TUBP);  Surgeon: [name], MD;  Location: [facility];  Service: Urology;  Laterality: N/A;   PROSTATE BIOPSY  11/26/2011   benign   TONSILLECTOMY     WISDOM TOOTH EXTRACTION      No outpatient medications have been marked as taking for the 10/18/20 encounter (Office Visit) with [name], MD.    Allergies  Allergen Reactions   Ezetimibe Other (See Comments)    Muscle aches   Statins Other (See Comments)    Joint pain    Review of Systems negative except from HPI and PMH  Physical Exam: BP (!) 148/72   Pulse (!) 59  Ht '6\' 3"'$  (1.905 m)   Wt 205 lb (93 kg)   SpO2 98%   BMI 25.62 kg/m  Well developed and well nourished in no acute distress HENT normal Neck supple with JVP-7 Lungs Clear Device pocket well healed; without hematoma or erythema.  There is no tethering  Regular rate and rhythm, no / murmur Abd-soft with active BS No Clubbing cyanosis  edema Skin-warm and dry A & Oriented  Grossly normal sensory and motor function  ECG: Sinus at 59 Interval 16/10/45    Assessment and  Plan:  Syncope/Presyncope  Lightheadedness  Implantable loop recorder   No interval syncope.  Interval spell concerning for seizure, particularly in the absence of loss of bodily tone.  Will defer evaluation to neurology.  Episodes of lightheadedness are unusual in their long duration up to 10-20 minutes, occurring in different positions.   Potentially arrhythmic.  Have asked him to use his activator on his Linq in the event that he has another episode.  Otherwise, we will plan to see him as needed    I,Stephanie Jimmye Norman,  acting as a scribe for [name], MD.,have documented all relevant documentation on the behalf of [name], MD,as directed by  [name], MD while in the presence of [name], MD.  I, [name], MD, have reviewed all documentation for this visit. The documentation on 10/18/20 for the exam, diagnosis, procedures, and orders are all accurate and complete.

## 2020-10-18 NOTE — Procedures (Signed)
   [facility]  EEG (ELECTROENCEPHALOGRAM) REPORT   STUDY DATE: 10/10/20 PATIENT NAME: Ricky [facility]. DOB: May 16, 1945 MRN: [id number]  ORDERING CLINICIAN: Andrey Spearman, MD   TECHNIQUE: Electroencephalogram was recorded utilizing standard 10-20 system of lead placement and reformatted into average and bipolar montages.  RECORDING TIME: 26 minutes  ACTIVATION: photic stimulation  CLINICAL INFORMATION: 75 year old male with abnormal spells.  FINDINGS: Posterior dominant background rhythms, which attenuate with eye opening, ranging 8-9 hertz and 20-30 microvolts. No focal, lateralizing, epileptiform activity or seizures are seen. Patient recorded in the awake and drowsy state. EKG channel shows regular rhythm of 60-65 beats per minute.   IMPRESSION:   Normal EEG in the awake and drowsy states.   INTERPRETING PHYSICIAN:  Penni Bombard, MD Certified in Neurology, Neurophysiology and Neuroimaging  [facility] [address] [place], [place] [address] [phone]

## 2020-10-18 NOTE — Patient Instructions (Signed)

## 2020-10-22 ENCOUNTER — Ambulatory Visit (INDEPENDENT_AMBULATORY_CARE_PROVIDER_SITE_OTHER): Payer: Medicare Other

## 2020-10-22 DIAGNOSIS — R55 Syncope and collapse: Secondary | ICD-10-CM

## 2020-10-22 LAB — CUP PACEART REMOTE DEVICE CHECK
Date Time Interrogation Session: 20220812011839
Implantable Pulse Generator Implant Date: 20191029

## 2020-11-09 NOTE — Progress Notes (Signed)
[facility] Summary Report / Loop Recorder 

## 2020-11-22 LAB — CUP PACEART REMOTE DEVICE CHECK
Date Time Interrogation Session: 20220914011549
Implantable Pulse Generator Implant Date: 20191029

## 2020-11-26 ENCOUNTER — Ambulatory Visit (INDEPENDENT_AMBULATORY_CARE_PROVIDER_SITE_OTHER): Payer: Medicare Other

## 2020-11-26 DIAGNOSIS — R55 Syncope and collapse: Secondary | ICD-10-CM | POA: Diagnosis not present

## 2020-12-03 NOTE — Progress Notes (Signed)
[facility] Summary Report / Loop Recorder 

## 2020-12-14 ENCOUNTER — Ambulatory Visit
Admission: RE | Admit: 2020-12-14 | Discharge: 2020-12-14 | Disposition: A | Discharge status: Home / Self Care | Payer: Medicare Other | Source: Ambulatory Visit | Attending: Geriatric Medicine | Admitting: Geriatric Medicine

## 2020-12-14 ENCOUNTER — Other Ambulatory Visit: Payer: Self-pay | Admitting: Geriatric Medicine

## 2020-12-14 DIAGNOSIS — R052 Subacute cough: Secondary | ICD-10-CM

## 2020-12-31 ENCOUNTER — Ambulatory Visit (INDEPENDENT_AMBULATORY_CARE_PROVIDER_SITE_OTHER): Payer: Medicare Other

## 2020-12-31 DIAGNOSIS — R55 Syncope and collapse: Secondary | ICD-10-CM | POA: Diagnosis not present

## 2020-12-31 LAB — CUP PACEART REMOTE DEVICE CHECK
Date Time Interrogation Session: 20221017012219
Implantable Pulse Generator Implant Date: 20191029

## 2021-01-08 NOTE — Progress Notes (Signed)
[facility] Summary Report / Loop Recorder 

## 2021-02-04 ENCOUNTER — Ambulatory Visit (INDEPENDENT_AMBULATORY_CARE_PROVIDER_SITE_OTHER): Payer: Medicare Other

## 2021-02-04 DIAGNOSIS — R55 Syncope and collapse: Secondary | ICD-10-CM

## 2021-02-05 LAB — CUP PACEART REMOTE DEVICE CHECK
Date Time Interrogation Session: 20221129041900
Implantable Pulse Generator Implant Date: 20191029

## 2021-02-12 NOTE — Progress Notes (Signed)
[facility] Summary Report / Loop Recorder 

## 2021-02-28 LAB — CUP PACEART REMOTE DEVICE CHECK
Date Time Interrogation Session: 20221222004017
Implantable Pulse Generator Implant Date: 20191029

## 2021-03-07 ENCOUNTER — Ambulatory Visit (INDEPENDENT_AMBULATORY_CARE_PROVIDER_SITE_OTHER): Payer: Medicare Other

## 2021-03-07 DIAGNOSIS — R55 Syncope and collapse: Secondary | ICD-10-CM | POA: Diagnosis not present

## 2021-03-19 NOTE — Progress Notes (Signed)
[facility] Summary Report / Loop Recorder 

## 2021-04-09 ENCOUNTER — Ambulatory Visit (INDEPENDENT_AMBULATORY_CARE_PROVIDER_SITE_OTHER): Payer: Medicare Other

## 2021-04-09 DIAGNOSIS — R55 Syncope and collapse: Secondary | ICD-10-CM

## 2021-04-09 LAB — CUP PACEART REMOTE DEVICE CHECK
Date Time Interrogation Session: 20230130232029
Implantable Pulse Generator Implant Date: 20191029

## 2021-04-17 NOTE — Progress Notes (Signed)
[facility] Summary Report / Loop Recorder 

## 2021-05-13 ENCOUNTER — Ambulatory Visit (INDEPENDENT_AMBULATORY_CARE_PROVIDER_SITE_OTHER): Payer: Medicare Other

## 2021-05-13 DIAGNOSIS — R55 Syncope and collapse: Secondary | ICD-10-CM | POA: Diagnosis not present

## 2021-05-14 LAB — CUP PACEART REMOTE DEVICE CHECK
Date Time Interrogation Session: 20230304232750
Implantable Pulse Generator Implant Date: 20191029

## 2021-05-23 NOTE — Progress Notes (Signed)
[facility] Summary Report / Loop Recorder 

## 2021-06-03 ENCOUNTER — Ambulatory Visit
Admission: RE | Admit: 2021-06-03 | Discharge: 2021-06-03 | Disposition: A | Discharge status: Home / Self Care | Payer: Medicare Other | Source: Ambulatory Visit | Attending: Internal Medicine | Admitting: Internal Medicine

## 2021-06-03 ENCOUNTER — Other Ambulatory Visit: Payer: Self-pay | Admitting: Internal Medicine

## 2021-06-03 DIAGNOSIS — M25511 Pain in right shoulder: Secondary | ICD-10-CM

## 2021-06-17 ENCOUNTER — Ambulatory Visit (INDEPENDENT_AMBULATORY_CARE_PROVIDER_SITE_OTHER): Payer: Medicare Other

## 2021-06-17 DIAGNOSIS — R55 Syncope and collapse: Secondary | ICD-10-CM | POA: Diagnosis not present

## 2021-06-18 LAB — CUP PACEART REMOTE DEVICE CHECK
Date Time Interrogation Session: 20230409230828
Implantable Pulse Generator Implant Date: 20191029

## 2021-07-03 ENCOUNTER — Telehealth: Payer: Self-pay | Admitting: Internal Medicine

## 2021-07-03 NOTE — Telephone Encounter (Signed)
Called patient regarding upcoming appointment, patient is notified. °

## 2021-07-04 NOTE — Progress Notes (Signed)
[facility] Summary Report / [email or web address] 

## 2021-07-18 ENCOUNTER — Inpatient Hospital Stay: Payer: Medicare Other | Attending: Internal Medicine

## 2021-07-18 ENCOUNTER — Other Ambulatory Visit: Payer: Self-pay

## 2021-07-18 DIAGNOSIS — Z8546 Personal history of malignant neoplasm of prostate: Secondary | ICD-10-CM | POA: Insufficient documentation

## 2021-07-18 DIAGNOSIS — Z79899 Other long term (current) drug therapy: Secondary | ICD-10-CM | POA: Insufficient documentation

## 2021-07-18 DIAGNOSIS — Z923 Personal history of irradiation: Secondary | ICD-10-CM | POA: Insufficient documentation

## 2021-07-18 DIAGNOSIS — R5383 Other fatigue: Secondary | ICD-10-CM | POA: Insufficient documentation

## 2021-07-18 DIAGNOSIS — I10 Essential (primary) hypertension: Secondary | ICD-10-CM | POA: Insufficient documentation

## 2021-07-18 DIAGNOSIS — D472 Monoclonal gammopathy: Secondary | ICD-10-CM | POA: Insufficient documentation

## 2021-07-18 DIAGNOSIS — Z888 Allergy status to other drugs, medicaments and biological substances status: Secondary | ICD-10-CM | POA: Insufficient documentation

## 2021-07-18 DIAGNOSIS — Z8711 Personal history of peptic ulcer disease: Secondary | ICD-10-CM | POA: Diagnosis not present

## 2021-07-18 LAB — CMP (CANCER CENTER ONLY)
ALT: 11 U/L (ref 0–44)
AST: 19 U/L (ref 15–41)
Albumin: 3.9 g/dL (ref 3.5–5.0)
Alkaline Phosphatase: 73 U/L (ref 38–126)
Anion gap: 6 (ref 5–15)
BUN: 22 mg/dL (ref 8–23)
CO2: 26 mmol/L (ref 22–32)
Calcium: 9.3 mg/dL (ref 8.9–10.3)
Chloride: 107 mmol/L (ref 98–111)
Creatinine: 1.31 mg/dL — ABNORMAL HIGH (ref 0.61–1.24)
GFR, Estimated: 56 mL/min — ABNORMAL LOW (ref >60)
Glucose, Bld: 122 mg/dL — ABNORMAL HIGH (ref 70–99)
Potassium: 4.2 mmol/L (ref 3.5–5.1)
Sodium: 139 mmol/L (ref 135–145)
Total Bilirubin: 0.6 mg/dL (ref 0.3–1.2)
Total Protein: 7.5 g/dL (ref 6.5–8.1)

## 2021-07-18 LAB — CBC WITH DIFFERENTIAL (CANCER CENTER ONLY)
Abs Immature Granulocytes: 0.02 10*3/uL (ref 0.00–0.07)
Basophils Absolute: 0 10*3/uL (ref 0.0–0.1)
Basophils Relative: 0 %
Eosinophils Absolute: 0.1 10*3/uL (ref 0.0–0.5)
Eosinophils Relative: 1 %
HCT: 39.8 % (ref 39.0–52.0)
Hemoglobin: 13.4 g/dL (ref 13.0–17.0)
Immature Granulocytes: 0 %
Lymphocytes Relative: 28 %
Lymphs Abs: 2.3 10*3/uL (ref 0.7–4.0)
MCH: 31.9 pg (ref 26.0–34.0)
MCHC: 33.7 g/dL (ref 30.0–36.0)
MCV: 94.8 fL (ref 80.0–100.0)
Monocytes Absolute: 0.5 10*3/uL (ref 0.1–1.0)
Monocytes Relative: 7 %
Neutro Abs: 5.2 10*3/uL (ref 1.7–7.7)
Neutrophils Relative %: 64 %
Platelet Count: 186 10*3/uL (ref 150–400)
RBC: 4.2 MIL/uL — ABNORMAL LOW (ref 4.22–5.81)
RDW: 14.2 % (ref 11.5–15.5)
WBC Count: 8.1 10*3/uL (ref 4.0–10.5)
nRBC: 0 % (ref 0.0–0.2)

## 2021-07-18 LAB — LACTATE DEHYDROGENASE: LDH: 175 U/L (ref 98–192)

## 2021-07-19 LAB — IGG, IGA, IGM
IgA: 371 mg/dL (ref 61–437)
IgG (Immunoglobin G), Serum: 1029 mg/dL (ref 603–1613)
IgM (Immunoglobulin M), Srm: 90 mg/dL (ref 15–143)

## 2021-07-19 LAB — KAPPA/LAMBDA LIGHT CHAINS
Kappa free light chain: 85.4 mg/L — ABNORMAL HIGH (ref 3.3–19.4)
Kappa, lambda light chain ratio: 2.24 — ABNORMAL HIGH (ref 0.26–1.65)
Lambda free light chains: 38.1 mg/L — ABNORMAL HIGH (ref 5.7–26.3)

## 2021-07-19 LAB — BETA 2 MICROGLOBULIN, SERUM: Beta-2 Microglobulin: 2.5 mg/L — ABNORMAL HIGH (ref 0.6–2.4)

## 2021-07-22 ENCOUNTER — Ambulatory Visit (INDEPENDENT_AMBULATORY_CARE_PROVIDER_SITE_OTHER): Payer: Medicare Other

## 2021-07-22 DIAGNOSIS — R55 Syncope and collapse: Secondary | ICD-10-CM

## 2021-07-25 ENCOUNTER — Ambulatory Visit: Payer: Medicare Other | Admitting: Internal Medicine

## 2021-07-25 ENCOUNTER — Encounter: Payer: Self-pay | Admitting: Internal Medicine

## 2021-07-25 ENCOUNTER — Other Ambulatory Visit: Payer: Self-pay

## 2021-07-25 ENCOUNTER — Inpatient Hospital Stay: Payer: Medicare Other | Admitting: Internal Medicine

## 2021-07-25 VITALS — BP 169/89 | HR 68 | Temp 97.3°F | Resp 19 | Wt 194.4 lb

## 2021-07-25 DIAGNOSIS — D472 Monoclonal gammopathy: Secondary | ICD-10-CM

## 2021-07-25 LAB — CUP PACEART REMOTE DEVICE CHECK
Date Time Interrogation Session: 20230512231010
Implantable Pulse Generator Implant Date: 20191029

## 2021-07-25 NOTE — Progress Notes (Signed)
[facility] [phone]   [phone] OFFICE PROGRESS NOTE  [name], MD [address] [place] [address]  DIAGNOSIS:  1) Monoclonal gammopathy of undetermined significance (MGUS). 2) History of prostate cancer T1C followed by Dr. [name],  status post curative radiotherapy under the care of Dr. [name].  PRIOR THERAPY: Curative radiotherapy to the prostate under the care of Dr. [name].  CURRENT THERAPY: Observation.  INTERVAL HISTORY: Ricky ARNETTE Sr. 76 y.o. male returns to the clinic today for annual follow-up visit.  The patient is feeling fine today with no concerning complaints except for mild fatigue.  He denied having any current chest pain, shortness of breath, cough or hemoptysis.  He has no nausea, vomiting, diarrhea or constipation.  He has no headache or visual changes.  He has no fever or chills.  He is here today for evaluation with repeat myeloma panel.  MEDICAL HISTORY: Past Medical History:  Diagnosis Date   Borderline diabetes    DIET CONTROLLED   Dyslipidemia    Elevated PSA    External carotid artery stenosis    MILD-- BILATERAL   Fall 11/05/2017   GERD (gastroesophageal reflux disease)    Gout, arthritis    Hearing loss in left ear    Heart murmur    MILD --  ASYMPTOMATIC   History of gastric ulcer    REMOTE HX   Hypertension    Pre-diabetes    Prostate cancer ([place]) 12/21/2012   Gleason 3+4=7   Seizure-like activity (HCC)    Syncope and collapse    Wears glasses     ALLERGIES:  is allergic to ezetimibe and statins.  MEDICATIONS:  Current Outpatient Medications  Medication Sig Dispense Refill   acetaminophen (TYLENOL) 650 MG CR tablet Take 650 mg by mouth every 8 (eight) hours.     allopurinol (ZYLOPRIM) 300 MG tablet Take 300 mg by mouth daily.      amLODipine (NORVASC) 5 MG tablet Take 5 mg by mouth daily.  1   colchicine 0.6 MG tablet Take 0.6 mg by mouth 2 (two) times daily  as needed (for gout).      Lidocaine 0.5 % AERO Apply topically.     Naphazoline HCl (CLEAR EYES OP) Place 1 drop into both eyes daily as needed (dry eyes).      omeprazole (PRILOSEC) 20 MG capsule Take 20 mg by mouth daily.     REPATHA SURECLICK 599 MG/ML SOAJ Inject 140 mg into the skin every 14 (fourteen) days.      tolnaftate (TINACTIN) 1 % cream Apply 1 application topically daily as needed (jock itch).     valsartan (DIOVAN) 320 MG tablet Take 320 mg by mouth daily.  0   No current facility-administered medications for this visit.    SURGICAL HISTORY:  Past Surgical History:  Procedure Laterality Date   KNEE ARTHROSCOPY W/ MENISCAL REPAIR Left 03/10/2012   LOOP RECORDER INSERTION N/A 01/05/2018   Procedure: LOOP RECORDER INSERTION;  Surgeon: [name], MD;  Location: [facility];  Service: Cardiovascular;  Laterality: N/A;   LUMBAR SPINE SURGERY  03/10/1989   PROSTATE BIOPSY N/A 12/21/2012   Procedure: BIOPSY TRANSRECTAL ULTRASONIC PROSTATE (TUBP);  Surgeon: [name], MD;  Location: [facility];  Service: Urology;  Laterality: N/A;   PROSTATE BIOPSY  11/26/2011   benign   TONSILLECTOMY     WISDOM TOOTH EXTRACTION      REVIEW  OF SYSTEMS:  A comprehensive review of systems was negative.   PHYSICAL EXAMINATION: General appearance: alert, cooperative, and no distress Head: Normocephalic, without obvious abnormality, atraumatic Neck: no adenopathy, no JVD, supple, symmetrical, trachea midline, and thyroid not enlarged, symmetric, no tenderness/mass/nodules Lymph nodes: Cervical, supraclavicular, and axillary nodes normal. Resp: clear to auscultation bilaterally Back: symmetric, no curvature. ROM normal. No CVA tenderness. Cardio: regular rate and rhythm, S1, S2 normal, no murmur, click, rub or gallop GI: soft, non-tender; bowel sounds normal; no masses,  no organomegaly Extremities: extremities normal, atraumatic, no cyanosis or edema  ECOG  PERFORMANCE STATUS: 1 - Symptomatic but completely ambulatory  Blood pressure (!) 169/89, pulse 68, temperature (!) 97.3 F (36.3 C), temperature source Tympanic, resp. rate 19, weight 194 lb 7 oz (88.2 kg), SpO2 100 %.  LABORATORY DATA: Lab Results  Component Value Date   WBC 8.1 07/18/2021   HGB 13.4 07/18/2021   HCT 39.8 07/18/2021   MCV 94.8 07/18/2021   PLT 186 07/18/2021      Chemistry      Component Value Date/Time   NA 139 07/18/2021 0917   NA 140 06/18/2016 0857   K 4.2 07/18/2021 0917   K 4.2 06/18/2016 0857   CL 107 07/18/2021 0917   CO2 26 07/18/2021 0917   CO2 25 06/18/2016 0857   BUN 22 07/18/2021 0917   BUN 14.7 06/18/2016 0857   CREATININE 1.31 (H) 07/18/2021 0917   CREATININE 1.1 06/18/2016 0857      Component Value Date/Time   CALCIUM 9.3 07/18/2021 0917   CALCIUM 10.1 06/18/2016 0857   ALKPHOS 73 07/18/2021 0917   ALKPHOS 79 06/18/2016 0857   AST 19 07/18/2021 0917   AST 43 (H) 06/18/2016 0857   ALT 11 07/18/2021 0917   ALT 41 06/18/2016 0857   BILITOT 0.6 07/18/2021 0917   BILITOT 0.73 06/18/2016 0857     [facility] lab results LTS FINAL DIAGNOSIS ASSESSMENT AND PLAN:  This is a very pleasant 76 years old African-American male with monoclonal gammopathy of undetermined significance and has been observation for several years.  The patient has no concerning complaints today except for mild fatigue. He had repeat myeloma panel that showed no concerning findings for slightly more increased free kappa light chain. I recommended for the patient to continue on observation with close monitoring and repeat myeloma panel in 1 year. His blood pressure is high today but he attributed it to whitecoat syndrome and it is usually normal at home. He was advised to call immediately if he has any other concerning symptoms in the interval. The patient voices understanding of current disease status and treatment options and is in agreement with the current care plan. All  questions were answered. The patient knows to call the clinic with any problems, questions or concerns. We can certainly see the patient much sooner if necessary.   Disclaimer: This note was dictated with voice recognition software. Similar sounding words can inadvertently be transcribed and may not be corrected upon review.

## 2021-08-08 NOTE — Progress Notes (Signed)
[facility] Summary Report / Loop Recorder 

## 2021-08-26 ENCOUNTER — Ambulatory Visit (INDEPENDENT_AMBULATORY_CARE_PROVIDER_SITE_OTHER): Payer: Medicare Other

## 2021-08-26 DIAGNOSIS — R55 Syncope and collapse: Secondary | ICD-10-CM

## 2021-08-29 LAB — CUP PACEART REMOTE DEVICE CHECK
Date Time Interrogation Session: 20230614232201
Implantable Pulse Generator Implant Date: 20191029

## 2021-09-13 NOTE — Progress Notes (Signed)
[facility] Summary Report / Loop Recorder 

## 2021-09-30 ENCOUNTER — Ambulatory Visit (INDEPENDENT_AMBULATORY_CARE_PROVIDER_SITE_OTHER): Payer: Medicare Other

## 2021-09-30 DIAGNOSIS — R55 Syncope and collapse: Secondary | ICD-10-CM | POA: Diagnosis not present

## 2021-10-01 LAB — CUP PACEART REMOTE DEVICE CHECK
Date Time Interrogation Session: 20230717232021
Implantable Pulse Generator Implant Date: 20191029

## 2021-10-14 ENCOUNTER — Telehealth: Payer: Self-pay

## 2021-10-14 NOTE — Telephone Encounter (Signed)
LINQ alert received. Device reached RRT 8/5.  Patient called and made aware. Discussed options of explant or leave in, patient would like device explanted. Patient will bring remote monitor in when he comes in for explant.

## 2021-11-08 NOTE — Progress Notes (Signed)
[facility] Summary Report / Loop Recorder 

## 2022-01-08 ENCOUNTER — Encounter: Payer: Self-pay | Admitting: Internal Medicine

## 2022-01-08 ENCOUNTER — Ambulatory Visit: Payer: Medicare Other | Attending: Internal Medicine | Admitting: Internal Medicine

## 2022-01-08 VITALS — BP 180/80 | HR 65 | Ht 75.0 in | Wt 201.2 lb

## 2022-01-08 DIAGNOSIS — I10 Essential (primary) hypertension: Secondary | ICD-10-CM

## 2022-01-08 DIAGNOSIS — Z9889 Other specified postprocedural states: Secondary | ICD-10-CM | POA: Diagnosis not present

## 2022-01-08 DIAGNOSIS — R55 Syncope and collapse: Secondary | ICD-10-CM | POA: Diagnosis not present

## 2022-01-08 MED ORDER — CLONIDINE HCL 0.1 MG PO TABS
0.1000 mg | ORAL_TABLET | Freq: Once | ORAL | Status: AC
  Administered 2022-01-08: 0.1 mg via ORAL

## 2022-01-08 MED ORDER — HYDROCHLOROTHIAZIDE 25 MG PO TABS: 25.0000 mg | ORAL_TABLET | Freq: Every day | ORAL | 3 refills | Status: DC

## 2022-01-08 NOTE — Addendum Note (Signed)
Addended by: Thora Lance on: 01/08/2022 10:35 AM   Modules accepted: Orders

## 2022-01-08 NOTE — Progress Notes (Signed)
Patient ID: Ricky Rowland., male   DOB: [birth year], 76 y.o.   MRN: [id number]      Patient Care Team: Ricky Carol, MD as PCP - General (Internal Medicine) Ricky Hector, MD as PCP - Cardiology (Cardiology)   HPI  Ricky Edinger Rowland. is a 76 y.o. male Seen in followup with ILR implanted for recurrent presyncope and syncope with two distinct syndromes  recent    Episodes of lightheadedness occurs while standing and seated   if while standing, it is not relieved by sitting, and if while seated, it is not relieved by lying down, not provoked by turning of the head; unassociated with sense of the world spinning, with out nausea and vomiting. Not Associated with palpitations.     March 28-2022 he was in the kitchen with his wife , had a spell of unresponsiveness, unassociated with loss of posture.  She is concerned   he was having a seizure that lasted approximately 30-40 seconds; he has been seen by neurology with ongoing evaluation.  EEG pending  event recorder neg  The patient denies chest pain, shortness of breath, nocturnal dyspnea, orthopnea or peripheral edema.  There have been no palpitations, lightheadedness or syncope.    Date Cr K Hgb  8/19 1.20 3.9 14.3  4/20 1.27 4.3 15.0  5/21 1.27 4.9 13.5  5/22 1.33 3.8 11.7  5/23 1.31 4.2 13.4    DATE TEST EF%   11/14 MRI   %   8/19 Echo  50-55 %         Records and Results Reviewed   Past Medical History:  Diagnosis Date   Borderline diabetes    DIET CONTROLLED   Dyslipidemia    Elevated PSA    External carotid artery stenosis    MILD-- BILATERAL   Fall 11/05/2017   GERD (gastroesophageal reflux disease)    Gout, arthritis    Hearing loss in left ear    Heart murmur    MILD --  ASYMPTOMATIC   History of gastric ulcer    REMOTE HX   Hypertension    Pre-diabetes    Prostate cancer ([place]) 12/21/2012   Gleason 3+4=7   Seizure-like activity (HCC)    Syncope and collapse    Wears glasses     Past  Surgical History:  Procedure Laterality Date   KNEE ARTHROSCOPY W/ MENISCAL REPAIR Left 03/10/2012   LOOP RECORDER INSERTION N/A 01/05/2018   Procedure: LOOP RECORDER INSERTION;  Surgeon: Ricky Sprang, MD;  Location: [name];  Service: Cardiovascular;  Laterality: N/A;   LUMBAR SPINE SURGERY  03/10/1989   PROSTATE BIOPSY N/A 12/21/2012   Procedure: BIOPSY TRANSRECTAL ULTRASONIC PROSTATE (TUBP);  Surgeon: Ricky Ben, MD;  Location: [facility];  Service: Urology;  Laterality: N/A;   PROSTATE BIOPSY  11/26/2011   benign   TONSILLECTOMY     WISDOM TOOTH EXTRACTION      Current Meds  Medication Sig   acetaminophen (TYLENOL) 650 MG CR tablet Take 650 mg by mouth every 8 (eight) hours.   allopurinol (ZYLOPRIM) 300 MG tablet Take 300 mg by mouth daily.    amLODipine (NORVASC) 5 MG tablet Take 5 mg by mouth daily.   colchicine 0.6 MG tablet Take 0.6 mg by mouth 2 (two) times daily as needed (for gout).    Lidocaine 0.5 % AERO Apply topically.   Naphazoline HCl (CLEAR EYES OP) Place 1 drop into both eyes daily as needed (dry eyes).  omeprazole (PRILOSEC) 20 MG capsule Take 20 mg by mouth daily.   REPATHA SURECLICK 709 MG/ML SOAJ Inject 140 mg into the skin every 14 (fourteen) days.    tolnaftate (TINACTIN) 1 % cream Apply 1 application topically daily as needed (jock itch).   valsartan (DIOVAN) 320 MG tablet Take 320 mg by mouth daily.    Allergies  Allergen Reactions   Ezetimibe Other (See Comments)    Muscle aches   Statins Other (See Comments)    Joint pain    Review of Systems negative except from HPI and PMH  Physical Exam: BP (!) 152/70   Pulse 65   Ht '6\' 3"'$  (1.905 m)   Wt 201 lb 3.2 oz (91.3 kg)   SpO2 99%   BMI 25.15 kg/m  Well developed and well nourished in no acute distress HENT normal Neck supple with JVP-flat Clear Device pocket well healed; without hematoma or erythema.  There is no tethering  Regular rate and rhythm, no   murmur Abd-soft with active BS No Clubbing cyanosis  edema Skin-warm and dry A & Oriented  Grossly normal sensory and motor function      Assessment and  Plan:  Syncope/Presyncope  Lightheadedness  Implantable loop recorder  HTN    Repeat blood pressure is 210.  We will go ahead and give him clonidine 0.1 mg in the office.  And will add low-dose hydrochlorothiazide.   Ricky DAFT SrMarland Kitchen [phone]  [phone]  Preop Dx: Syncope with implantable loop recorder Postop Dx same/   Procedure: Explantation of loop recorder Lidocaine was infiltrated over the medial aspect of the device pocket.  The pocket was opened and the device explanted and a Steri-Strip dressing was applied  Cx: None   Ricky Axe, MD 01/08/2022 9:04 AM

## 2022-01-08 NOTE — Patient Instructions (Addendum)
Medication Instructions:  Your physician has recommended you make the following change in your medication:   ** Begin HCTZ '25mg'$  - 1 tablet by mouth daily.  Monitor your BP daily at home about 2 hours after taking medication.   Labwork: None ordered.  Testing/Procedures: None ordered.  Follow-Up: as needed with Dr Caryl Comes   Implantable Loop Recorder Removal, Care After This sheet gives you information about how to care for yourself after your procedure. Your health care provider may also give you more specific instructions. If you have problems or questions, contact your health care provider. What can I expect after the procedure? After the procedure, it is common to have: Soreness or discomfort near the incision. Some swelling or bruising near the incision.  Follow these instructions at home: Incision care  Monitor your cardiac device site for redness, swelling, and drainage. Call the device clinic at [phone] if you experience these symptoms or fever/chills.  Keep the large square bandage on your site for 4 days and then you may remove it yourself. Keep the steri-strips underneath in place.   You may shower tomorrow with bandage and the steri-strips in place. They will usually fall off on their own, or may be removed after 10 days. Pat dry.   Avoid lotions, ointments, or perfumes over your incision until it is well-healed.  Please do not submerge in water until your site is completely healed.   If your wound site starts to bleed apply pressure.       If you have any questions/concerns please call the device clinic at [phone].  Activity  Return to your normal activities.  Contact a health care provider if: You have redness, swelling, or pain around your incision. You have a fever.

## 2022-04-24 ENCOUNTER — Other Ambulatory Visit (HOSPITAL_COMMUNITY): Payer: Self-pay | Admitting: Urology

## 2022-04-24 DIAGNOSIS — R9721 Rising PSA following treatment for malignant neoplasm of prostate: Secondary | ICD-10-CM

## 2022-05-09 ENCOUNTER — Ambulatory Visit (HOSPITAL_COMMUNITY)
Admission: RE | Admit: 2022-05-09 | Discharge: 2022-05-09 | Disposition: A | Discharge status: Home / Self Care | Payer: Medicare Other | Source: Ambulatory Visit | Attending: Urology | Admitting: Urology

## 2022-05-09 DIAGNOSIS — R9721 Rising PSA following treatment for malignant neoplasm of prostate: Secondary | ICD-10-CM

## 2022-05-09 MED ORDER — PIFLIFOLASTAT F 18 (PYLARIFY) INJECTION
9.0000 | Freq: Once | INTRAVENOUS | Status: AC
  Administered 2022-05-09: 9.9 via INTRAVENOUS

## 2022-05-22 NOTE — Progress Notes (Signed)
GU Location of Tumor / Histology: Prostate Ca biochemical recurrence with bone mets  PSA is (5.21 on 03/2022)    Sites of Visceral and Bony Metastatic Disease: Lumber spine  Location(s) of Symptomatic Metastases: L4  05/09/2022 Dr. Junious Silk NM PET (PSMA) Skull to Mid Thigh Clinical Data:  Prostate carcinoma with biochemical recurrence  FINDINGS: NECK No radiotracer activity in neck lymph nodes.   Incidental CT finding: None.   CHEST No radiotracer accumulation within mediastinal or hilar lymph nodes. No suspicious pulmonary nodules on the CT scan.   Incidental CT finding: None.   ABDOMEN/PELVIS Prostate: No focal activity in the prostate bed.   Lymph nodes: No abnormal radiotracer accumulation within pelvic or abdominal nodes.   Liver: No evidence of liver metastasis.   Incidental CT finding: Penile prosthetic reservoir in the RIGHT abdomen. Multiple diverticula of the descending colon and sigmoid colon without acute inflammation. Atherosclerotic calcification of the aorta.   SKELETON   Focus of intense radiotracer activity within the LEFT pedicle and neural arch of the L4 vertebral body. Associated sclerotic lesion on CT portion exam. Activity is intense with SUV max equal 18.1.    IMPRESSION: 1. Intense radiotracer activity within the L4 vertebral body consistent with prostate cancer skeletal metastasis. 2. No evidence of local prostate cancer recurrence in the prostate bed. 3. No evidence of visceral metastasis or nodal metastasis. 4.  Aortic Atherosclerosis (ICD10-I70.0).   Past/Anticipated interventions by urology, if any:     Past/Anticipated interventions by medical oncology, if any: NA  Weight changes, if any:  No  Bowel/Bladder complaints, if any:  No  Nausea/Vomiting, if any:  No  Pain issues, if any:  4/10 right lower leg  If Spine Met(s), symptoms, if any, include: Bowel/Bladder retention or incontinence (please describe): No Numbness or  weakness in extremities (please describe): No Current Decadron regimen, if applicable: No  Ambulatory status? Walker? Wheelchair?:  Independent  SAFETY ISSUES: Prior radiation? Yes, IMRT prostate 08/2014 Pacemaker/ICD?  No Possible current pregnancy? Male Is the patient on methotrexate? No  Current Complaints / other details:

## 2022-05-23 NOTE — Progress Notes (Incomplete)
Radiation Oncology         [phone] ________________________________  Initial Outpatient Consultation  Name: Ricky Rowland Sr. MRN: [id number]  Date: 05/26/2022  DOB: 03/26/1945  [id number], Ricky Moll, MD  Ricky Aloe, MD   REFERRING PHYSICIAN: Festus Aloe, MD  DIAGNOSIS: 77 y.o. gentleman with Stage T*** adenocarcinoma of the prostate with Gleason score of ***+***, and PSA of ***.  No diagnosis found.  HISTORY OF PRESENT ILLNESS: Ricky Rowland. is a 77 y.o. male with a diagnosis of prostate cancer. He was initially diagnosed with intermediate risk prostate cancer in January of 2015. Biopsy of the prostate revealed  and opted for active surveillance. Repeat PSA in April 2015 rose to 11.13. He was seen by radiation oncologist Dr. Valere Rowland who recommended EBRT to his prostate and seminal vesicle for his high risk disease.    He was noted to have an elevated PSA of *** by his primary care physician, Dr. Marland Rowland  Accordingly, he was referred for evaluation in urology by Dr. Marland Rowland on ***,  digital rectal examination was performed at that time revealing ***.  The patient proceeded to transrectal ultrasound with 12 biopsies of the prostate on ***.  The prostate volume measured *** cc.  Out of 12 core biopsies, *** were positive.  The maximum Gleason score was ***, and this was seen in ***.  PSMA PET scan on 05/09/22 showed intense radiotracer activity within the L4 vertebral body. No evidence of local prostate cancer recurrence in the prostate bed or other disease metastasis were seen.   The patient reviewed the biopsy results with his urologist and he has kindly been referred today for discussion of potential radiation treatment options.   PREVIOUS RADIATION THERAPY: Yes   June 2015 - August 2015: Prostate 6 Gy in 40 fractions; Seminal vesicles 56 Gy in 40 fractions  PAST MEDICAL HISTORY:  Past Medical History:  Diagnosis Date   Borderline diabetes    DIET CONTROLLED    Dyslipidemia    Elevated PSA    External carotid artery stenosis    MILD-- BILATERAL   Fall 11/05/2017   GERD (gastroesophageal reflux disease)    Gout, arthritis    Hearing loss in left ear    Heart murmur    MILD --  ASYMPTOMATIC   History of gastric ulcer    REMOTE HX   Hypertension    Pre-diabetes    Prostate cancer ([place]) 12/21/2012   Gleason 3+4=7   Seizure-like activity (HCC)    Syncope and collapse    Wears glasses       PAST SURGICAL HISTORY: Past Surgical History:  Procedure Laterality Date   KNEE ARTHROSCOPY W/ MENISCAL REPAIR Left 03/10/2012   LOOP RECORDER INSERTION N/A 01/05/2018   Procedure: LOOP RECORDER INSERTION;  Surgeon: Ricky Sprang, MD;  Location: [facility];  Service: Cardiovascular;  Laterality: N/A;   LUMBAR SPINE SURGERY  03/10/1989   PROSTATE BIOPSY N/A 12/21/2012   Procedure: BIOPSY TRANSRECTAL ULTRASONIC PROSTATE (TUBP);  Surgeon: Ricky Ben, MD;  Location: [facility];  Service: Urology;  Laterality: N/A;   PROSTATE BIOPSY  11/26/2011   benign   TONSILLECTOMY     WISDOM TOOTH EXTRACTION      FAMILY HISTORY:  Family History  Problem Relation Age of Onset   Heart disease Mother    Heart disease Father    Cancer Sister        lung   Cancer Brother  prostate    SOCIAL HISTORY:  Social History   Socioeconomic History   Marital status: Married    Spouse name: geraldine   Number of children: 3   Years of education: some college   Highest education level: Not on file  Occupational History   Occupation: retired  Tobacco Use   Smoking status: Former    Packs/day: 1.00    Years: 30.00    Additional pack years: 0.00    Total pack years: 30.00    Types: Cigarettes    Quit date: 12/15/1997    Years since quitting: 24.4   Smokeless tobacco: Never  Vaping Use   Vaping Use: Never used  Substance and Sexual Activity   Alcohol use: Yes    Alcohol/week: 20.0 standard drinks of alcohol    Types: 20  Shots of liquor per week    Comment: OCCASIONAL, 12/28/17 stopped 11/05/17   Drug use: No   Sexual activity: Not on file  Other Topics Concern   Not on file  Social History Narrative   Lives with wife at home    caffeine use- occas, mostly decaf coffee   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    Social Determinants of Health   Financial Resource Strain: Not on file  Food Insecurity: Not on file  Transportation Needs: Not on file  Physical Activity: Not on file  Stress: Not on file  Social Connections: Not on file  Intimate Partner Violence: Not on file    ALLERGIES: Ezetimibe and Statins  MEDICATIONS:  Current Outpatient Medications  Medication Sig Dispense Refill   acetaminophen (TYLENOL) 650 MG CR tablet Take 650 mg by mouth every 8 (eight) hours.     allopurinol (ZYLOPRIM) 300 MG tablet Take 300 mg by mouth daily.      amLODipine (NORVASC) 5 MG tablet Take 5 mg by mouth daily.  1   colchicine 0.6 MG tablet Take 0.6 mg by mouth 2 (two) times daily as needed (for gout).      hydrochlorothiazide (HYDRODIURIL) 25 MG tablet Take 1 tablet (25 mg total) by mouth daily. 90 tablet 3   Lidocaine 0.5 % AERO Apply topically.     Naphazoline HCl (CLEAR EYES OP) Place 1 drop into both eyes daily as needed (dry eyes).      omeprazole (PRILOSEC) 20 MG capsule Take 20 mg by mouth daily.     REPATHA SURECLICK [id number] MG/ML SOAJ Inject 140 mg into the skin every 14 (fourteen) days.      tolnaftate (TINACTIN) 1 % cream Apply 1 application topically daily as needed (jock itch).     valsartan (DIOVAN) 320 MG tablet Take 320 mg by mouth daily.  0   No current facility-administered medications for this visit.    REVIEW OF SYSTEMS:  On review of systems, the patient reports that he is doing well overall. He denies any chest pain, shortness of breath, cough, fevers, chills, night sweats, unintended weight changes. He denies any bowel disturbances, and denies abdominal pain, nausea or vomiting. He denies any new musculoskeletal  or joint aches or pains. His IPSS was  , indicating *** urinary symptoms (Reference 0-7 mild, 8-19 moderate, 20-35 severe).  His SHIM was  , indicating he {does not have/has mild/moderate/severe} erectile dysfunction (Reference - 22-25 None, 17-21 Mild, 8-16 Moderate, 1-7 Severe). A complete review of systems is obtained and is otherwise negative.   PHYSICAL EXAM:  Wt Readings from Last 3 Encounters:  01/08/22 201 lb 3.2 oz (91.3 kg)  07/25/21 194  lb 7 oz (88.2 kg)  10/18/20 205 lb (93 kg)   Temp Readings from Last 3 Encounters:  07/25/21 (!) 97.3 F (36.3 C) (Tympanic)  07/25/20 (!) 97.3 F (36.3 C) (Tympanic)  07/18/19 98.7 F (37.1 C) (Temporal)   BP Readings from Last 3 Encounters:  01/08/22 (!) 180/80  07/25/21 (!) 169/89  10/18/20 (!) 148/72   Pulse Readings from Last 3 Encounters:  01/08/22 65  07/25/21 68  10/18/20 (!) 59    /10  In general this is a well appearing gentleman in no acute distress. He's alert and oriented x4 and appropriate throughout the examination. Cardiopulmonary assessment is negative for acute distress, and he exhibits normal effort.    KPS = ***  100 - Normal; no complaints; no evidence of disease. 90   - Able to carry on normal activity; minor signs or symptoms of disease. 80   - Normal activity with effort; some signs or symptoms of disease. 71   - Cares for self; unable to carry on normal activity or to do active work. 60   - Requires occasional assistance, but is able to care for most of his personal needs. 50   - Requires considerable assistance and frequent medical care. 45   - Disabled; requires special care and assistance. 68   - Severely disabled; hospital admission is indicated although death not imminent. 9   - Very sick; hospital admission necessary; active supportive treatment necessary. 10   - Moribund; fatal processes progressing rapidly. 0     - Dead  Karnofsky DA, Abelmann [place], Craver LS and Burchenal [facility] [phone]) The use of the  nitrogen mustards in the palliative treatment of carcinoma: with particular reference to bronchogenic carcinoma Cancer 1 634-56  LABORATORY DATA:  Lab Results  Component Value Date   WBC 8.1 07/18/2021   HGB 13.4 07/18/2021   HCT 39.8 07/18/2021   MCV 94.8 07/18/2021   PLT 186 07/18/2021   Lab Results  Component Value Date   NA 139 07/18/2021   K 4.2 07/18/2021   CL 107 07/18/2021   CO2 26 07/18/2021   Lab Results  Component Value Date   ALT 11 07/18/2021   AST 19 07/18/2021   ALKPHOS 73 07/18/2021   BILITOT 0.6 07/18/2021     RADIOGRAPHY: NM PET (PSMA) SKULL TO MID THIGH  Result Date: 05/12/2022 CLINICAL DATA:  Prostate carcinoma with biochemical recurrence. EXAM: NUCLEAR MEDICINE PET SKULL BASE TO THIGH TECHNIQUE: 9.9 mCi F18 Piflufolastat (Pylarify) was injected intravenously. Full-ring PET imaging was performed from the skull base to thigh after the radiotracer. CT data was obtained and used for attenuation correction and anatomic localization. COMPARISON:  None Available. FINDINGS: NECK No radiotracer activity in neck lymph nodes. Incidental CT finding: None. CHEST No radiotracer accumulation within mediastinal or hilar lymph nodes. No suspicious pulmonary nodules on the CT scan. Incidental CT finding: None. ABDOMEN/PELVIS Prostate: No focal activity in the prostate bed. Lymph nodes: No abnormal radiotracer accumulation within pelvic or abdominal nodes. Liver: No evidence of liver metastasis. Incidental CT finding: Penile prosthetic reservoir in the RIGHT abdomen. Multiple diverticula of the descending colon and sigmoid colon without acute inflammation. Atherosclerotic calcification of the aorta. SKELETON Focus of intense radiotracer activity within the LEFT pedicle and neural arch of the L4 vertebral body. Associated sclerotic lesion on CT portion exam. Activity is intense with SUV max equal 18.1. The IMPRESSION: 1. Intense radiotracer activity within the L4 vertebral body consistent  with prostate cancer skeletal metastasis. 2.  No evidence of local prostate cancer recurrence in the prostate bed. 3. No evidence of visceral metastasis or nodal metastasis. 4.  Aortic Atherosclerosis (ICD10-I70.0). Electronically Signed   By: Suzy Bouchard M.D.   On: 05/12/2022 14:38      IMPRESSION/PLAN: 1. 77 y.o. gentleman with Stage T*** adenocarcinoma of the prostate with Gleason Score of ***+***, and PSA of ***. We discussed the patient's workup and outlined the nature of prostate cancer in this setting. The patient's T stage, Gleason's score, and PSA put him into the *** risk group. Accordingly, he is eligible for a variety of potential treatment options including {ADT in combination with} brachytherapy, 5.5-8 weeks of external radiation, {5 weeks of external radiation with an upfront brachytherapy boost,} or prostatectomy. We discussed the available radiation techniques, and focused on the details and logistics of delivery. {The patient may not be an ideal candidate for brachytherapy boost with a prostate volume of *** prior to downsizing from hormone therapy. We discussed that based on his prostate volume, he would require beginning treatment with a 5 alpha reductase inhibitor and ADT for at least 3 months to allow for downsizing of the prostate prior to initiating radiotherapy.} We discussed and outlined the risks, benefits, short and long-term effects associated with radiotherapy and compared and contrasted these with prostatectomy. We discussed the role of SpaceOAR gel in reducing the rectal toxicity associated with radiotherapy. {We also detailed the role of ADT in the treatment of *** risk prostate cancer and outlined the associated side effects that could be expected with this therapy.}  He appears to have a good understanding of his disease and our treatment recommendations which are of curative intent.  He was encouraged to ask questions that were answered to his stated satisfaction.  At  the conclusion of our conversation, the patient is interested in moving forward with ***.  We personally spent *** minutes in this encounter including chart review, reviewing radiological studies, meeting face-to-face with the patient, entering orders and completing documentation.    Leona Singleton, PA-C    Tyler Pita, MD  [facility] Direct Dial: [phone]  Fax: [phone] [email or web address]  Skype  LinkedIn

## 2022-05-25 NOTE — Progress Notes (Signed)
Radiation Oncology         [phone] ________________________________  Initial Outpatient Consultation  Name: Ricky Rowland Sr. MRN: [id number]  Date: 05/26/2022  DOB: Sep 02, 1945  [id number], Jori Moll, MD  Festus Aloe, MD   REFERRING PHYSICIAN: Festus Aloe, MD  DIAGNOSIS: 77 y.o. gentleman with osseous metastasis to L4 and rising PSA of 5.21 from Gleason 3+4 prostate cancer, s/p IMRT in 2015  No diagnosis found.  HISTORY OF PRESENT ILLNESS: Ricky Rowland. is a 77 y.o. male with a diagnosis of oligometastatic prostate cancer. He was initially diagnosed with prostate cancer in 11/2012 by Dr. [name]. Pathology from the procedure showed a microscopic focus of Gleason 3+4 adenocarcinoma in one core and of Gleason 3+3 in a second core. He was seen in consultation by Dr. [name] here in radiation oncology on 03/24/13. At that time, they opted for active surveillance. A repeat PSA obtained in April 2015 rose to 11.13, prompting referral back to Dr. [name] on 07/14/13. Given his moderate obstructive symptoms, the recommendation was for EBRT. He was subsequently treated with 8 weeks of IMRT to the prostate and seminal vesicles. His PSA subsequently dropped over the next few years and reached a nadir of 0.1 in 12/2016.  More recently, he was referred back to urology with Dr. Junious Silk on 02/14/22 for LUTS, ED, and his history of prostate cancer. An updated PSA obtained that day revealed a significant rise to 5.07, up from 0.8 in 2022. This was repeated approximately 7 weeks later and confirmed elevation at 5.21. This prompted a PSMA PET scan on 05/09/22 showing: intense radiotracer activity within the L4 vertebral body; no evidence of local prostate cancer recurrence in the prostate bed or other disease metastasis were seen.   The patient reviewed the biopsy results with his urologist and he has kindly been referred today for discussion of potential radiation treatment options.   PREVIOUS  RADIATION THERAPY: Yes  08/31/13 - 10/26/13: Prostate 78 Gy in 40 fractions; Seminal vesicles 56 Gy in 40 fractions  PAST MEDICAL HISTORY:  Past Medical History:  Diagnosis Date   Borderline diabetes    DIET CONTROLLED   Dyslipidemia    Elevated PSA    External carotid artery stenosis    MILD-- BILATERAL   Fall 11/05/2017   GERD (gastroesophageal reflux disease)    Gout, arthritis    Hearing loss in left ear    Heart murmur    MILD --  ASYMPTOMATIC   History of gastric ulcer    REMOTE HX   Hypertension    Pre-diabetes    Prostate cancer ([place]) 12/21/2012   Gleason 3+4=7   Seizure-like activity (HCC)    Syncope and collapse    Wears glasses       PAST SURGICAL HISTORY: Past Surgical History:  Procedure Laterality Date   KNEE ARTHROSCOPY W/ MENISCAL REPAIR Left 03/10/2012   LOOP RECORDER INSERTION N/A 01/05/2018   Procedure: LOOP RECORDER INSERTION;  Surgeon: [name], MD;  Location: [facility];  Service: Cardiovascular;  Laterality: N/A;   LUMBAR SPINE SURGERY  03/10/1989   PROSTATE BIOPSY N/A 12/21/2012   Procedure: BIOPSY TRANSRECTAL ULTRASONIC PROSTATE (TUBP);  Surgeon: [name], MD;  Location: [facility];  Service: Urology;  Laterality: N/A;   PROSTATE BIOPSY  11/26/2011   benign   TONSILLECTOMY     WISDOM TOOTH EXTRACTION      FAMILY HISTORY:  Family History  Problem Relation Age of Onset   Heart disease  Mother    Heart disease Father    Cancer Sister        lung   Cancer Brother        prostate    SOCIAL HISTORY:  Social History   Socioeconomic History   Marital status: Married    Spouse name: geraldine   Number of children: 3   Years of education: some college   Highest education level: Not on file  Occupational History   Occupation: retired  Tobacco Use   Smoking status: Former    Packs/day: 1.00    Years: 30.00    Additional pack years: 0.00    Total pack years: 30.00    Types: Cigarettes    Quit date:  12/15/1997    Years since quitting: 24.4   Smokeless tobacco: Never  Vaping Use   Vaping Use: Never used  Substance and Sexual Activity   Alcohol use: Yes    Alcohol/week: 20.0 standard drinks of alcohol    Types: 20 Shots of liquor per week    Comment: OCCASIONAL, 12/28/17 stopped 11/05/17   Drug use: No   Sexual activity: Not on file  Other Topics Concern   Not on file  Social History Narrative   Lives with wife at home    caffeine use- occas, mostly decaf coffee   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    Social Determinants of Health   Financial Resource Strain: Not on file  Food Insecurity: Not on file  Transportation Needs: Not on file  Physical Activity: Not on file  Stress: Not on file  Social Connections: Not on file  Intimate Partner Violence: Not on file    ALLERGIES: Ezetimibe and Statins  MEDICATIONS:  Current Outpatient Medications  Medication Sig Dispense Refill   acetaminophen (TYLENOL) 650 MG CR tablet Take 650 mg by mouth every 8 (eight) hours.     allopurinol (ZYLOPRIM) 300 MG tablet Take 300 mg by mouth daily.      amLODipine (NORVASC) 5 MG tablet Take 5 mg by mouth daily.  1   colchicine 0.6 MG tablet Take 0.6 mg by mouth 2 (two) times daily as needed (for gout).      hydrochlorothiazide (HYDRODIURIL) 25 MG tablet Take 1 tablet (25 mg total) by mouth daily. 90 tablet 3   Lidocaine 0.5 % AERO Apply topically.     Naphazoline HCl (CLEAR EYES OP) Place 1 drop into both eyes daily as needed (dry eyes).      omeprazole (PRILOSEC) 20 MG capsule Take 20 mg by mouth daily.     REPATHA SURECLICK [id number] MG/ML SOAJ Inject 140 mg into the skin every 14 (fourteen) days.      tolnaftate (TINACTIN) 1 % cream Apply 1 application topically daily as needed (jock itch).     valsartan (DIOVAN) 320 MG tablet Take 320 mg by mouth daily.  0   No current facility-administered medications for this encounter.    REVIEW OF SYSTEMS:  On review of systems, the patient reports that he is doing well  overall. He denies any chest pain, shortness of breath, cough, fevers, chills, night sweats, unintended weight changes. He denies any bowel disturbances, and denies abdominal pain, nausea or vomiting. He denies any new musculoskeletal or joint aches or pains. *** A complete review of systems is obtained and is otherwise negative.   PHYSICAL EXAM:  Wt Readings from Last 3 Encounters:  01/08/22 201 lb 3.2 oz (91.3 kg)  07/25/21 194 lb 7 oz (88.2 kg)  10/18/20 205  lb (93 kg)   Temp Readings from Last 3 Encounters:  07/25/21 (!) 97.3 F (36.3 C) (Tympanic)  07/25/20 (!) 97.3 F (36.3 C) (Tympanic)  07/18/19 98.7 F (37.1 C) (Temporal)   BP Readings from Last 3 Encounters:  01/08/22 (!) 180/80  07/25/21 (!) 169/89  10/18/20 (!) 148/72   Pulse Readings from Last 3 Encounters:  01/08/22 65  07/25/21 68  10/18/20 (!) 59    /10  In general this is a well appearing gentleman in no acute distress. He's alert and oriented x4 and appropriate throughout the examination. Cardiopulmonary assessment is negative for acute distress, and he exhibits normal effort.    KPS = ***  100 - Normal; no complaints; no evidence of disease. 90   - Able to carry on normal activity; minor signs or symptoms of disease. 80   - Normal activity with effort; some signs or symptoms of disease. 75   - Cares for self; unable to carry on normal activity or to do active work. 60   - Requires occasional assistance, but is able to care for most of his personal needs. 50   - Requires considerable assistance and frequent medical care. 27   - Disabled; requires special care and assistance. 75   - Severely disabled; hospital admission is indicated although death not imminent. 38   - Very sick; hospital admission necessary; active supportive treatment necessary. 10   - Moribund; fatal processes progressing rapidly. 0     - Dead  Karnofsky DA, Abelmann [place], Craver LS and Burchenal [facility] [phone]) The use of the nitrogen mustards  in the palliative treatment of carcinoma: with particular reference to bronchogenic carcinoma Cancer 1 634-56  LABORATORY DATA:  Lab Results  Component Value Date   WBC 8.1 07/18/2021   HGB 13.4 07/18/2021   HCT 39.8 07/18/2021   MCV 94.8 07/18/2021   PLT 186 07/18/2021   Lab Results  Component Value Date   NA 139 07/18/2021   K 4.2 07/18/2021   CL 107 07/18/2021   CO2 26 07/18/2021   Lab Results  Component Value Date   ALT 11 07/18/2021   AST 19 07/18/2021   ALKPHOS 73 07/18/2021   BILITOT 0.6 07/18/2021     RADIOGRAPHY: NM PET (PSMA) SKULL TO MID THIGH  Result Date: 05/12/2022 CLINICAL DATA:  Prostate carcinoma with biochemical recurrence. EXAM: NUCLEAR MEDICINE PET SKULL BASE TO THIGH TECHNIQUE: 9.9 mCi F18 Piflufolastat (Pylarify) was injected intravenously. Full-ring PET imaging was performed from the skull base to thigh after the radiotracer. CT data was obtained and used for attenuation correction and anatomic localization. COMPARISON:  None Available. FINDINGS: NECK No radiotracer activity in neck lymph nodes. Incidental CT finding: None. CHEST No radiotracer accumulation within mediastinal or hilar lymph nodes. No suspicious pulmonary nodules on the CT scan. Incidental CT finding: None. ABDOMEN/PELVIS Prostate: No focal activity in the prostate bed. Lymph nodes: No abnormal radiotracer accumulation within pelvic or abdominal nodes. Liver: No evidence of liver metastasis. Incidental CT finding: Penile prosthetic reservoir in the RIGHT abdomen. Multiple diverticula of the descending colon and sigmoid colon without acute inflammation. Atherosclerotic calcification of the aorta. SKELETON Focus of intense radiotracer activity within the LEFT pedicle and neural arch of the L4 vertebral body. Associated sclerotic lesion on CT portion exam. Activity is intense with SUV max equal 18.1. The IMPRESSION: 1. Intense radiotracer activity within the L4 vertebral body consistent with prostate  cancer skeletal metastasis. 2. No evidence of local prostate cancer recurrence in  the prostate bed. 3. No evidence of visceral metastasis or nodal metastasis. 4.  Aortic Atherosclerosis (ICD10-I70.0). Electronically Signed   By: Suzy Bouchard M.D.   On: 05/12/2022 14:38      IMPRESSION/PLAN: 1. 77 y.o. gentleman with osseous metastasis to L4 and rising PSA of 5.21 from Gleason 3+4 prostate cancer, s/p IMRT in 2015 Today, we talked to the patient and family about the findings and workup thus far. We discussed the natural history of prostate cancer and general treatment, highlighting the role of radiotherapy in the management of osseous metastases. We discussed the available radiation techniques, and focused on the details and logistics of delivery. In his case, the recommendation is for *** fractions of SBRT to the lesion at L4. We reviewed the anticipated acute and late sequelae associated with radiation in this setting. The patient was encouraged to ask questions that were answered to his satisfaction.  At the conclusion of our conversation, the patient ***.   We personally spent *** minutes in this encounter including chart review, reviewing radiological studies, meeting face-to-face with the patient, entering orders and completing documentation.    Leona Singleton, PA-C    Tyler Pita, MD  [facility] Direct Dial: [phone]  Fax: [phone] [email or web address]  Skype  [email or web address]   This document serves as a record of services personally performed by Tyler Pita, MD and Leona Singleton, PA-C. It was created on their behalf by Wilburn Mylar, a trained medical scribe. The creation of this record is based on the scribe's personal observations and the provider's statements to them. This document has been checked and approved by the attending provider.

## 2022-05-26 ENCOUNTER — Ambulatory Visit
Admission: RE | Admit: 2022-05-26 | Discharge: 2022-05-26 | Disposition: A | Discharge status: Home / Self Care | Payer: Medicare Other | Source: Ambulatory Visit | Attending: Radiation Oncology | Admitting: Radiation Oncology

## 2022-05-26 ENCOUNTER — Encounter: Payer: Self-pay | Admitting: Radiation Oncology

## 2022-05-26 VITALS — BP 158/72 | HR 67 | Temp 97.5°F | Resp 20 | Ht 75.0 in | Wt 206.4 lb

## 2022-05-26 DIAGNOSIS — K219 Gastro-esophageal reflux disease without esophagitis: Secondary | ICD-10-CM | POA: Insufficient documentation

## 2022-05-26 DIAGNOSIS — C7951 Secondary malignant neoplasm of bone: Secondary | ICD-10-CM

## 2022-05-26 DIAGNOSIS — R9721 Rising PSA following treatment for malignant neoplasm of prostate: Secondary | ICD-10-CM | POA: Insufficient documentation

## 2022-05-26 DIAGNOSIS — C61 Malignant neoplasm of prostate: Secondary | ICD-10-CM | POA: Insufficient documentation

## 2022-05-26 DIAGNOSIS — Z79899 Other long term (current) drug therapy: Secondary | ICD-10-CM | POA: Insufficient documentation

## 2022-05-26 DIAGNOSIS — M109 Gout, unspecified: Secondary | ICD-10-CM | POA: Insufficient documentation

## 2022-05-26 DIAGNOSIS — Z8711 Personal history of peptic ulcer disease: Secondary | ICD-10-CM | POA: Diagnosis not present

## 2022-05-26 DIAGNOSIS — E785 Hyperlipidemia, unspecified: Secondary | ICD-10-CM | POA: Insufficient documentation

## 2022-05-26 DIAGNOSIS — I10 Essential (primary) hypertension: Secondary | ICD-10-CM | POA: Diagnosis not present

## 2022-05-26 NOTE — Progress Notes (Signed)
  Radiation Oncology         [phone]) [phone] ________________________________  Name: Ricky Edinger Sr. MRN: [id number]  Date: 05/26/2022  DOB: [birth year]  STEREOTACTIC BODY RADIOTHERAPY SIMULATION AND TREATMENT PLANNING NOTE    ICD-10-CM   1. Metastasis to bone (HCC)  C79.51     2. Malignant neoplasm of prostate ([place])  C61       DIAGNOSIS:  77 yo man with isolated oligometastasis to L4 from prostate cancer  NARRATIVE:  The patient was brought to the [facility].  Identity was confirmed.  All relevant records and images related to the planned course of therapy were reviewed.  The patient freely provided informed written consent to proceed with treatment after reviewing the details related to the planned course of therapy. The consent form was witnessed and verified by the simulation staff.  Then, the patient was set-up in a stable reproducible  supine position for radiation therapy.  A BodyFix immobilization pillow was fabricated for reproducible positioning.  Surface markings were placed.  The CT images were loaded into the planning software.  The gross target volumes (GTV) and planning target volumes (PTV) were delinieated, and avoidance structures were contoured.  Treatment planning then occurred.  The radiation prescription was entered and confirmed.  A total of two complex treatment devices were fabricated in the form of the BodyFix immobilization pillow and a neck accuform cushion.  I have requested : 3D Simulation  I have requested a DVH of the following structures: targets and all normal structures near the target including left kidney, right kidney, bowel, nerve roots, skin and others as noted on the radiation plan to maintain doses in adherence with established limits  SPECIAL TREATMENT PROCEDURE:  The planned course of therapy using radiation constitutes a special treatment procedure. Special care is required in the management of this patient for the following reasons.  High dose per fraction requiring special monitoring for increased toxicities of treatment including daily imaging..  The special nature of the planned course of radiotherapy will require increased physician supervision and oversight to ensure patient's safety with optimal treatment outcomes.    This requires extended time and effort.    PLAN:  The patient will receive 40 Gy in 5 fractions.  ________________________________  Sheral Apley Tammi Klippel, M.D.

## 2022-05-28 DIAGNOSIS — C7951 Secondary malignant neoplasm of bone: Secondary | ICD-10-CM | POA: Diagnosis not present

## 2022-06-10 ENCOUNTER — Other Ambulatory Visit: Payer: Self-pay

## 2022-06-10 ENCOUNTER — Ambulatory Visit
Admission: RE | Admit: 2022-06-10 | Discharge: 2022-06-10 | Disposition: A | Discharge status: Home / Self Care | Payer: Medicare Other | Source: Ambulatory Visit | Attending: Radiation Oncology | Admitting: Radiation Oncology

## 2022-06-10 DIAGNOSIS — C7951 Secondary malignant neoplasm of bone: Secondary | ICD-10-CM | POA: Insufficient documentation

## 2022-06-10 LAB — RAD ONC ARIA SESSION SUMMARY
Course Elapsed Days: 0
Plan Fractions Treated to Date: 1
Plan Prescribed Dose Per Fraction: 8 Gy
Plan Total Fractions Prescribed: 5
Plan Total Prescribed Dose: 40 Gy
Reference Point Dosage Given to Date: 8 Gy
Reference Point Session Dosage Given: 8 Gy
Session Number: 1

## 2022-06-11 ENCOUNTER — Ambulatory Visit: Payer: Medicare Other | Admitting: Radiation Oncology

## 2022-06-12 ENCOUNTER — Other Ambulatory Visit: Payer: Self-pay

## 2022-06-12 ENCOUNTER — Ambulatory Visit
Admission: RE | Admit: 2022-06-12 | Discharge: 2022-06-12 | Disposition: A | Discharge status: Home / Self Care | Payer: Medicare Other | Source: Ambulatory Visit | Attending: Radiation Oncology | Admitting: Radiation Oncology

## 2022-06-12 DIAGNOSIS — C7951 Secondary malignant neoplasm of bone: Secondary | ICD-10-CM | POA: Diagnosis not present

## 2022-06-12 LAB — RAD ONC ARIA SESSION SUMMARY
Course Elapsed Days: 2
Plan Fractions Treated to Date: 2
Plan Prescribed Dose Per Fraction: 8 Gy
Plan Total Fractions Prescribed: 5
Plan Total Prescribed Dose: 40 Gy
Reference Point Dosage Given to Date: 16 Gy
Reference Point Session Dosage Given: 8 Gy
Session Number: 2

## 2022-06-13 ENCOUNTER — Ambulatory Visit: Payer: Medicare Other | Admitting: Radiation Oncology

## 2022-06-16 ENCOUNTER — Other Ambulatory Visit: Payer: Self-pay

## 2022-06-16 ENCOUNTER — Ambulatory Visit
Admission: RE | Admit: 2022-06-16 | Discharge: 2022-06-16 | Disposition: A | Discharge status: Home / Self Care | Payer: Medicare Other | Source: Ambulatory Visit | Attending: Radiation Oncology | Admitting: Radiation Oncology

## 2022-06-16 DIAGNOSIS — C7951 Secondary malignant neoplasm of bone: Secondary | ICD-10-CM

## 2022-06-16 LAB — RAD ONC ARIA SESSION SUMMARY
Course Elapsed Days: 6
Plan Fractions Treated to Date: 3
Plan Prescribed Dose Per Fraction: 8 Gy
Plan Total Fractions Prescribed: 5
Plan Total Prescribed Dose: 40 Gy
Reference Point Dosage Given to Date: 24 Gy
Reference Point Session Dosage Given: 8 Gy
Session Number: 3

## 2022-06-18 ENCOUNTER — Ambulatory Visit: Payer: Medicare Other

## 2022-06-18 ENCOUNTER — Ambulatory Visit
Admission: RE | Admit: 2022-06-18 | Discharge: 2022-06-18 | Disposition: A | Discharge status: Home / Self Care | Payer: Medicare Other | Source: Ambulatory Visit | Attending: Radiation Oncology | Admitting: Radiation Oncology

## 2022-06-18 ENCOUNTER — Other Ambulatory Visit: Payer: Self-pay

## 2022-06-18 DIAGNOSIS — C7951 Secondary malignant neoplasm of bone: Secondary | ICD-10-CM | POA: Diagnosis not present

## 2022-06-18 LAB — RAD ONC ARIA SESSION SUMMARY
Course Elapsed Days: 8
Plan Fractions Treated to Date: 4
Plan Prescribed Dose Per Fraction: 8 Gy
Plan Total Fractions Prescribed: 5
Plan Total Prescribed Dose: 40 Gy
Reference Point Dosage Given to Date: 32 Gy
Reference Point Session Dosage Given: 8 Gy
Session Number: 4

## 2022-06-20 ENCOUNTER — Encounter: Payer: Self-pay | Admitting: Urology

## 2022-06-20 ENCOUNTER — Ambulatory Visit
Admission: RE | Admit: 2022-06-20 | Discharge: 2022-06-20 | Disposition: A | Discharge status: Home / Self Care | Payer: Medicare Other | Source: Ambulatory Visit | Attending: Radiation Oncology | Admitting: Radiation Oncology

## 2022-06-20 ENCOUNTER — Other Ambulatory Visit: Payer: Self-pay

## 2022-06-20 DIAGNOSIS — C7951 Secondary malignant neoplasm of bone: Secondary | ICD-10-CM | POA: Diagnosis not present

## 2022-06-20 LAB — RAD ONC ARIA SESSION SUMMARY
Course Elapsed Days: 10
Plan Fractions Treated to Date: 5
Plan Prescribed Dose Per Fraction: 8 Gy
Plan Total Fractions Prescribed: 5
Plan Total Prescribed Dose: 40 Gy
Reference Point Dosage Given to Date: 40 Gy
Reference Point Session Dosage Given: 8 Gy
Session Number: 5

## 2022-07-10 NOTE — Progress Notes (Signed)
  Radiation Oncology         [phone] ________________________________  Name: [name] MRN: [id number]  Date: 06/20/2022  DOB: [birth year]  End of Treatment Note  Diagnosis:    77 yo man with isolated oligometastasis to L4 from prostate cancer      Indication for treatment:  Curative, Definitive SBRT       Radiation treatment dates:   06/10/22 - 06/20/22  Site/dose:   The L$ target was treated to 40 Gy in 5 fractions of 8 Gy  Beams/energy:   The patient was treated using stereotactic body radiotherapy according to a 3D conformal radiotherapy plan.  Volumetric arc fields were employed to deliver 6 MV X-rays.  Image guidance was performed with per fraction cone beam CT prior to treatment under personal MD supervision.  Immobilization was achieved using BodyFix Pillow.  Narrative: The patient tolerated radiation treatment relatively well with only mild fatigue.  Plan: The patient will receive a call in about one month from the radiation oncology department. He will continue follow up with his urologist, Dr. Mena Goes, as well and has a scheduled follow-up visit with him on 07/17/2022.  ________________________________  Artist Pais. Kathrynn Running, M.D.

## 2022-07-15 ENCOUNTER — Inpatient Hospital Stay: Payer: Medicare Other | Attending: Internal Medicine

## 2022-07-15 ENCOUNTER — Other Ambulatory Visit: Payer: Self-pay

## 2022-07-15 DIAGNOSIS — Z888 Allergy status to other drugs, medicaments and biological substances status: Secondary | ICD-10-CM | POA: Insufficient documentation

## 2022-07-15 DIAGNOSIS — I10 Essential (primary) hypertension: Secondary | ICD-10-CM | POA: Diagnosis not present

## 2022-07-15 DIAGNOSIS — M545 Low back pain, unspecified: Secondary | ICD-10-CM | POA: Insufficient documentation

## 2022-07-15 DIAGNOSIS — D472 Monoclonal gammopathy: Secondary | ICD-10-CM | POA: Diagnosis present

## 2022-07-15 DIAGNOSIS — Z79899 Other long term (current) drug therapy: Secondary | ICD-10-CM | POA: Diagnosis not present

## 2022-07-15 DIAGNOSIS — Z8546 Personal history of malignant neoplasm of prostate: Secondary | ICD-10-CM | POA: Insufficient documentation

## 2022-07-15 DIAGNOSIS — Z923 Personal history of irradiation: Secondary | ICD-10-CM | POA: Insufficient documentation

## 2022-07-15 DIAGNOSIS — Z8711 Personal history of peptic ulcer disease: Secondary | ICD-10-CM | POA: Diagnosis not present

## 2022-07-15 LAB — CMP (CANCER CENTER ONLY)
ALT: 19 U/L (ref 0–44)
AST: 22 U/L (ref 15–41)
Albumin: 4 g/dL (ref 3.5–5.0)
Alkaline Phosphatase: 69 U/L (ref 38–126)
Anion gap: 6 (ref 5–15)
BUN: 52 mg/dL — ABNORMAL HIGH (ref 8–23)
CO2: 23 mmol/L (ref 22–32)
Calcium: 9.3 mg/dL (ref 8.9–10.3)
Chloride: 104 mmol/L (ref 98–111)
Creatinine: 1.73 mg/dL — ABNORMAL HIGH (ref 0.61–1.24)
GFR, Estimated: 40 mL/min — ABNORMAL LOW (ref >60)
Glucose, Bld: 133 mg/dL — ABNORMAL HIGH (ref 70–99)
Potassium: 4.3 mmol/L (ref 3.5–5.1)
Sodium: 133 mmol/L — ABNORMAL LOW (ref 135–145)
Total Bilirubin: 0.4 mg/dL (ref 0.3–1.2)
Total Protein: 7.9 g/dL (ref 6.5–8.1)

## 2022-07-15 LAB — CBC WITH DIFFERENTIAL (CANCER CENTER ONLY)
Abs Immature Granulocytes: 0.02 10*3/uL (ref 0.00–0.07)
Basophils Absolute: 0 10*3/uL (ref 0.0–0.1)
Basophils Relative: 1 %
Eosinophils Absolute: 0.1 10*3/uL (ref 0.0–0.5)
Eosinophils Relative: 2 %
HCT: 37.7 % — ABNORMAL LOW (ref 39.0–52.0)
Hemoglobin: 12.8 g/dL — ABNORMAL LOW (ref 13.0–17.0)
Immature Granulocytes: 0 %
Lymphocytes Relative: 23 %
Lymphs Abs: 1.5 10*3/uL (ref 0.7–4.0)
MCH: 32.7 pg (ref 26.0–34.0)
MCHC: 34 g/dL (ref 30.0–36.0)
MCV: 96.4 fL (ref 80.0–100.0)
Monocytes Absolute: 0.5 10*3/uL (ref 0.1–1.0)
Monocytes Relative: 9 %
Neutro Abs: 4.1 10*3/uL (ref 1.7–7.7)
Neutrophils Relative %: 65 %
Platelet Count: 162 10*3/uL (ref 150–400)
RBC: 3.91 MIL/uL — ABNORMAL LOW (ref 4.22–5.81)
RDW: 13 % (ref 11.5–15.5)
WBC Count: 6.3 10*3/uL (ref 4.0–10.5)
nRBC: 0 % (ref 0.0–0.2)

## 2022-07-15 LAB — LACTATE DEHYDROGENASE: LDH: 158 U/L (ref 98–192)

## 2022-07-16 LAB — KAPPA/LAMBDA LIGHT CHAINS
Kappa free light chain: 119.8 mg/L — ABNORMAL HIGH (ref 3.3–19.4)
Kappa, lambda light chain ratio: 2.23 — ABNORMAL HIGH (ref 0.26–1.65)
Lambda free light chains: 53.8 mg/L — ABNORMAL HIGH (ref 5.7–26.3)

## 2022-07-16 LAB — BETA 2 MICROGLOBULIN, SERUM: Beta-2 Microglobulin: 3.5 mg/L — ABNORMAL HIGH (ref 0.6–2.4)

## 2022-07-18 LAB — IGG, IGA, IGM
IgA: 385 mg/dL (ref 61–437)
IgG (Immunoglobin G), Serum: 1180 mg/dL (ref 603–1613)
IgM (Immunoglobulin M), Srm: 87 mg/dL (ref 15–143)

## 2022-07-22 ENCOUNTER — Inpatient Hospital Stay (HOSPITAL_BASED_OUTPATIENT_CLINIC_OR_DEPARTMENT_OTHER): Payer: Medicare Other | Admitting: Internal Medicine

## 2022-07-22 VITALS — BP 146/69 | HR 65 | Temp 98.2°F | Resp 13 | Wt 204.5 lb

## 2022-07-22 DIAGNOSIS — D472 Monoclonal gammopathy: Secondary | ICD-10-CM | POA: Diagnosis not present

## 2022-07-22 NOTE — Addendum Note (Signed)
Addended by: Charma Igo on: 07/22/2022 02:28 PM   Modules accepted: Orders

## 2022-07-22 NOTE — Progress Notes (Signed)
[facility] [phone]   [phone] OFFICE PROGRESS NOTE  Ricky Dills, MD [address] [place] [address]  DIAGNOSIS:  1) Monoclonal gammopathy of undetermined significance (MGUS). 2) History of prostate cancer T1C followed by Dr. Brunilda Payor,  status post curative radiotherapy under the care of Dr. Dayton Scrape.  PRIOR THERAPY: Curative radiotherapy to the prostate under the care of Dr. Dayton Scrape.  CURRENT THERAPY: Observation.  INTERVAL HISTORY: Ricky Rowland Sr. 77 y.o. male with history of the clinic today for follow-up visit accompanied by his wife.  The patient is feeling fine with no concerning complaints except for low back pain.  He had a PET scan performed on May 09, 2022 by Dr. Mena Goes and that showed intense radiotracer activity within the L4 vertebral body consistent with prostate cancer skeletal metastasis.  There was no evidence of local prostate cancer recurrence in the prostate bed and no evidence of visceral metastasis or nodal metastasis.  The patient was seen by Dr. Kathrynn Running and started palliative radiotherapy.  He is also start treatment for the prostate cancer under the care of Dr. Mena Goes.  He denied having any current chest pain, shortness of breath, cough or hemoptysis.  He has no nausea, vomiting, diarrhea or constipation.  He is here today for evaluation with repeat myeloma panel for evaluation of his MGUS.  MEDICAL HISTORY: Past Medical History:  Diagnosis Date   Borderline diabetes    DIET CONTROLLED   Dyslipidemia    Elevated PSA    External carotid artery stenosis    MILD-- BILATERAL   Fall 11/05/2017   GERD (gastroesophageal reflux disease)    Gout, arthritis    Hearing loss in left ear    Heart murmur    MILD --  ASYMPTOMATIC   History of gastric ulcer    REMOTE HX   Hypertension    Pre-diabetes    Prostate cancer (HCC) 12/21/2012   Gleason 3+4=7   Seizure-like activity (HCC)    Syncope and  collapse    Wears glasses     ALLERGIES:  is allergic to ezetimibe and statins.  MEDICATIONS:  Current Outpatient Medications  Medication Sig Dispense Refill   acetaminophen (TYLENOL) 650 MG CR tablet Take 650 mg by mouth every 8 (eight) hours.     allopurinol (ZYLOPRIM) 300 MG tablet Take 300 mg by mouth daily.      amLODipine (NORVASC) 5 MG tablet Take 5 mg by mouth daily.  1   colchicine 0.6 MG tablet Take 0.6 mg by mouth 2 (two) times daily as needed (for gout).      hydrochlorothiazide (HYDRODIURIL) 25 MG tablet Take 1 tablet (25 mg total) by mouth daily. 90 tablet 3   Lidocaine 0.5 % AERO Apply topically.     meclizine (ANTIVERT) 25 MG tablet Take by mouth.     Menthol-Camphor (TIGER BALM EXTRA STRENGTH) 11-10 % OINT as directed Externally     Naphazoline HCl (CLEAR EYES OP) Place 1 drop into both eyes daily as needed (dry eyes).      omeprazole (PRILOSEC) 20 MG capsule Take 20 mg by mouth daily.     ORGOVYX 120 MG tablet Take 120 mg by mouth daily.     REPATHA SURECLICK 140 MG/ML SOAJ Inject 140 mg into the skin every 14 (fourteen) days.      tolnaftate (TINACTIN) 1 % cream Apply 1 application topically daily as needed (jock itch).     valsartan (DIOVAN)  320 MG tablet Take 320 mg by mouth daily.  0   No current facility-administered medications for this visit.    SURGICAL HISTORY:  Past Surgical History:  Procedure Laterality Date   KNEE ARTHROSCOPY W/ MENISCAL REPAIR Left 03/10/2012   LOOP RECORDER INSERTION N/A 01/05/2018   Procedure: LOOP RECORDER INSERTION;  Surgeon: Duke Salvia, MD;  Location: [facility] INVASIVE CV LAB;  Service: Cardiovascular;  Laterality: N/A;   LUMBAR SPINE SURGERY  03/10/1989   PROSTATE BIOPSY N/A 12/21/2012   Procedure: BIOPSY TRANSRECTAL ULTRASONIC PROSTATE (TUBP);  Surgeon: Lindaann Slough, MD;  Location: [facility];  Service: Urology;  Laterality: N/A;   PROSTATE BIOPSY  11/26/2011   benign   TONSILLECTOMY     WISDOM TOOTH  EXTRACTION      REVIEW OF SYSTEMS:  Constitutional: positive for fatigue Eyes: negative Ears, nose, mouth, throat, and face: negative Respiratory: negative Cardiovascular: negative Gastrointestinal: negative Genitourinary:negative Integument/breast: negative Hematologic/lymphatic: negative Musculoskeletal:positive for back pain Neurological: negative Behavioral/Psych: negative Endocrine: negative Allergic/Immunologic: negative   PHYSICAL EXAMINATION: General appearance: alert, cooperative, and no distress Head: Normocephalic, without obvious abnormality, atraumatic Neck: no adenopathy, no JVD, supple, symmetrical, trachea midline, and thyroid not enlarged, symmetric, no tenderness/mass/nodules Lymph nodes: Cervical, supraclavicular, and axillary nodes normal. Resp: clear to auscultation bilaterally Back: symmetric, no curvature. ROM normal. No CVA tenderness. Cardio: regular rate and rhythm, S1, S2 normal, no murmur, click, rub or gallop GI: soft, non-tender; bowel sounds normal; no masses,  no organomegaly Extremities: extremities normal, atraumatic, no cyanosis or edema Neurologic: Alert and oriented X 3, normal strength and tone. Normal symmetric reflexes. Normal coordination and gait  ECOG PERFORMANCE STATUS: 1 - Symptomatic but completely ambulatory  Blood pressure (!) 146/69, pulse 65, temperature 98.2 F (36.8 C), temperature source Oral, resp. rate 13, weight 204 lb 8 oz (92.8 kg), SpO2 100 %.  LABORATORY DATA: Lab Results  Component Value Date   WBC 6.3 07/15/2022   HGB 12.8 (L) 07/15/2022   HCT 37.7 (L) 07/15/2022   MCV 96.4 07/15/2022   PLT 162 07/15/2022      Chemistry      Component Value Date/Time   NA 133 (L) 07/15/2022 0912   NA 140 06/18/2016 0857   K 4.3 07/15/2022 0912   K 4.2 06/18/2016 0857   CL 104 07/15/2022 0912   CO2 23 07/15/2022 0912   CO2 25 06/18/2016 0857   BUN 52 (H) 07/15/2022 0912   BUN 14.7 06/18/2016 0857   CREATININE 1.73 (H)  07/15/2022 0912   CREATININE 1.1 06/18/2016 0857      Component Value Date/Time   CALCIUM 9.3 07/15/2022 0912   CALCIUM 10.1 06/18/2016 0857   ALKPHOS 69 07/15/2022 0912   ALKPHOS 79 06/18/2016 0857   AST 22 07/15/2022 0912   AST 43 (H) 06/18/2016 0857   ALT 19 07/15/2022 0912   ALT 41 06/18/2016 0857   BILITOT 0.4 07/15/2022 0912   BILITOT 0.73 06/18/2016 0857     [facility] lab results LTS FINAL DIAGNOSIS ASSESSMENT AND PLAN:  This is a very pleasant 77 years old African-American male with monoclonal gammopathy of undetermined significance and has been observation for several years.  The patient had repeat myeloma panel performed recently that showed further increase in the free kappa light chain concerning for disease progression. I recommended for him to have a bone marrow biopsy and aspirate for further evaluation of his disease. Regarding the suspicious metastatic prostate cancer, he is currently undergoing treatment under the care  of Dr. Mena Goes with urology. I will see the patient back for follow-up visit in 3 weeks for evaluation and discussion of his bone marrow biopsy and aspirate and further recommendation regarding his condition. He was advised to call immediately if he has any other concerning symptoms in the interval. The patient voices understanding of current disease status and treatment options and is in agreement with the current care plan. All questions were answered. The patient knows to call the clinic with any problems, questions or concerns. We can certainly see the patient much sooner if necessary.   Disclaimer: This note was dictated with voice recognition software. Similar sounding words can inadvertently be transcribed and may not be corrected upon review.

## 2022-07-25 ENCOUNTER — Telehealth: Payer: Self-pay | Admitting: Internal Medicine

## 2022-07-25 NOTE — Telephone Encounter (Signed)
Scheduled per 05/14 los, patient has been called and voicemail was left. 

## 2022-07-29 ENCOUNTER — Ambulatory Visit
Admission: RE | Admit: 2022-07-29 | Discharge: 2022-07-29 | Disposition: A | Discharge status: Home / Self Care | Payer: Medicare Other | Source: Ambulatory Visit | Attending: Radiation Oncology | Admitting: Radiation Oncology

## 2022-07-29 NOTE — Progress Notes (Signed)
  Radiation Oncology         [phone] ________________________________  Name: Ricky Panther Sr. MRN: [id number]  Date of Service: 07/29/2022  DOB: 11-16-1945  Post Treatment Telephone Note  Diagnosis:  77 yo man with isolated oligometastasis to L4 from prostate cancer       Indication for treatment:  Curative, Definitive SBRT        Radiation treatment dates:   06/10/22 - 06/20/22(as documented in provider EOT note)   The patient was not available for call today.   Patient (has a scheduled follow up visit with his urologist, Dr. Mena Goes , on 08/2022 for ongoing surveillance. He was counseled that PSA levels will be drawn in the urology office, and was reassured that additional time is expected to improve bowel and bladder symptoms. He was encouraged to call back with concerns or questions regarding radiation.   Ruel Favors, LPN

## 2022-07-30 ENCOUNTER — Other Ambulatory Visit: Payer: Self-pay | Admitting: Student

## 2022-07-30 DIAGNOSIS — D472 Monoclonal gammopathy: Secondary | ICD-10-CM

## 2022-07-30 NOTE — H&P (Signed)
Referring Physician(s): Mohamed,Mohamed  Supervising Physician: Marliss Coots  Patient Status:  [facility] OP  Chief Complaint: "I'm getting a bone marrow biopsy"    Subjective: Pt known to IR team from BM bx in 2015. He is a 77 yo male with PMH sig for borderline DM, carotid artery stenosis, GERD, gout, HTN, prostate cancer and MGUS. He has been under observation for his MGUS for several years and recently had a repeat myeloma panel performed that showed increase in free kappa light chain concerning for disease progression. He is scheduled today for CT guided bone marrow biopsy for further evaluation. He denies fever,HA,CP,dyspnea, cough, abd pain, N/V or bleeding. He does have back pain.     Past Medical History:  Diagnosis Date   Borderline diabetes    DIET CONTROLLED   Dyslipidemia    Elevated PSA    External carotid artery stenosis    MILD-- BILATERAL   Fall 11/05/2017   GERD (gastroesophageal reflux disease)    Gout, arthritis    Hearing loss in left ear    Heart murmur    MILD --  ASYMPTOMATIC   History of gastric ulcer    REMOTE HX   Hypertension    Pre-diabetes    Prostate cancer (HCC) 12/21/2012   Gleason 3+4=7   Seizure-like activity (HCC)    Syncope and collapse    Wears glasses    Past Surgical History:  Procedure Laterality Date   KNEE ARTHROSCOPY W/ MENISCAL REPAIR Left 03/10/2012   LOOP RECORDER INSERTION N/A 01/05/2018   Procedure: LOOP RECORDER INSERTION;  Surgeon: Duke Salvia, MD;  [facility] CV LAB;  Service: Cardiovascular;  Laterality: N/A;   LUMBAR SPINE SURGERY  03/10/1989   PROSTATE BIOPSY N/A 12/21/2012   Procedure: BIOPSY TRANSRECTAL ULTRASONIC PROSTATE (TUBP);  Surgeon: Lindaann Slough, MD;  Location[facility];  Service: Urology;  Laterality: N/A;   PROSTATE BIOPSY  11/26/2011   benign   TONSILLECTOMY     WISDOM TOOTH EXTRACTION       Allergies: Ezetimibe and Statins  Medications: Prior to Admission  medications   Medication Sig Start Date End Date Taking? Authorizing Provider  acetaminophen (TYLENOL) 650 MG CR tablet Take 650 mg by mouth every 8 (eight) hours.    [provider]  allopurinol (ZYLOPRIM) 300 MG tablet Take 300 mg by mouth daily.     [provider]  amLODipine (NORVASC) 5 MG tablet Take 5 mg by mouth daily. 01/22/18   [provider]  colchicine 0.6 MG tablet Take 0.6 mg by mouth 2 (two) times daily as needed (for gout).     [provider]  Lidocaine 0.5 % AERO Apply topically.    [provider]  meclizine (ANTIVERT) 25 MG tablet Take by mouth. 09/08/15   [provider]  Menthol-Camphor (TIGER BALM EXTRA STRENGTH) 11-10 % OINT as directed Externally    [provider]  Naphazoline HCl (CLEAR EYES OP) Place 1 drop into both eyes daily as needed (dry eyes).     [provider]  omeprazole (PRILOSEC) 20 MG capsule Take 20 mg by mouth daily.    [provider]  ORGOVYX 120 MG tablet Take 120 mg by mouth daily. 05/19/22   [provider]  REPATHA SURECLICK 140 MG/ML SOAJ Inject 140 mg into the skin every 14 (fourteen) days.  06/07/17   [provider]  tolnaftate (TINACTIN) 1 % cream Apply 1 application topically daily as needed (jock itch).  [provider]  valsartan (DIOVAN) 320 MG tablet Take 320 mg by mouth daily. 01/22/18   [provider]     Vital Signs: Vitals:   07/31/22 0925  BP: (!) 141/69  Pulse: 60  Resp: 16  Temp: 98.7 F (37.1 C)  SpO2: 98%       Code Status: FULL CODE  Physical Exam: awake/alert; chest- CTA bilat; heart- RRR; abd- soft,+BS,NT; no LE edema  Imaging: No results found.  Labs:  CBC: Recent Labs    07/15/22 0912  WBC 6.3  HGB 12.8*  HCT 37.7*  PLT 162    COAGS: No results for input(s): "INR", "APTT" in the last 8760 hours.  BMP: Recent Labs    07/15/22 0912  NA 133*  K 4.3  CL 104  CO2 23   GLUCOSE 133*  BUN 52*  CALCIUM 9.3  CREATININE 1.73*  GFRNONAA 40*    LIVER FUNCTION TESTS: Recent Labs    07/15/22 0912  BILITOT 0.4  AST 22  ALT 19  ALKPHOS 69  PROT 7.9  ALBUMIN 4.0    Assessment and Plan: 77 yo male with PMH sig for borderline DM, carotid artery stenosis, GERD, gout, HTN, prostate cancer and MGUS. He has been under observation for his MGUS for several years and recently had a repeat myeloma panel performed that showed increase in free kappa light chain concerning for disease progression. He is scheduled today for CT guided bone marrow biopsy for further evaluation. Risks and benefits of procedure was discussed with the patient   including, but not limited to bleeding, infection, damage to adjacent structures or low yield requiring additional tests.  All of the questions were answered and there is agreement to proceed.  Consent signed and in chart.    Electronically Signed: D. Jeananne Rama, PA-C 07/30/2022, 3:34 PM   I spent a total of 20 minutes at the the patient's bedside AND on the patient's hospital floor or unit, greater than 50% of which was counseling/coordinating care for CT guided bone marrow biopsy

## 2022-07-31 ENCOUNTER — Ambulatory Visit (HOSPITAL_COMMUNITY)
Admission: RE | Admit: 2022-07-31 | Discharge: 2022-07-31 | Disposition: A | Discharge status: Home / Self Care | Payer: Medicare Other | Source: Ambulatory Visit | Attending: Internal Medicine | Admitting: Internal Medicine

## 2022-07-31 ENCOUNTER — Encounter (HOSPITAL_COMMUNITY): Payer: Self-pay

## 2022-07-31 ENCOUNTER — Other Ambulatory Visit: Payer: Self-pay

## 2022-07-31 DIAGNOSIS — C61 Malignant neoplasm of prostate: Secondary | ICD-10-CM | POA: Diagnosis not present

## 2022-07-31 DIAGNOSIS — D472 Monoclonal gammopathy: Secondary | ICD-10-CM | POA: Insufficient documentation

## 2022-07-31 LAB — CBC WITH DIFFERENTIAL/PLATELET
Abs Immature Granulocytes: 0.04 10*3/uL (ref 0.00–0.07)
Basophils Absolute: 0 10*3/uL (ref 0.0–0.1)
Basophils Relative: 0 %
Eosinophils Absolute: 0.1 10*3/uL (ref 0.0–0.5)
Eosinophils Relative: 2 %
HCT: 36.1 % — ABNORMAL LOW (ref 39.0–52.0)
Hemoglobin: 11.8 g/dL — ABNORMAL LOW (ref 13.0–17.0)
Immature Granulocytes: 1 %
Lymphocytes Relative: 19 %
Lymphs Abs: 1.3 10*3/uL (ref 0.7–4.0)
MCH: 32.3 pg (ref 26.0–34.0)
MCHC: 32.7 g/dL (ref 30.0–36.0)
MCV: 98.9 fL (ref 80.0–100.0)
Monocytes Absolute: 0.5 10*3/uL (ref 0.1–1.0)
Monocytes Relative: 8 %
Neutro Abs: 4.8 10*3/uL (ref 1.7–7.7)
Neutrophils Relative %: 70 %
Platelets: 168 10*3/uL (ref 150–400)
RBC: 3.65 MIL/uL — ABNORMAL LOW (ref 4.22–5.81)
RDW: 13.9 % (ref 11.5–15.5)
WBC: 6.8 10*3/uL (ref 4.0–10.5)
nRBC: 0 % (ref 0.0–0.2)

## 2022-07-31 MED ORDER — MIDAZOLAM HCL 2 MG/2ML IJ SOLN
INTRAMUSCULAR | Status: AC | PRN
  Administered 2022-07-31 (×2): 1 mg via INTRAVENOUS

## 2022-07-31 MED ORDER — MIDAZOLAM HCL 2 MG/2ML IJ SOLN
INTRAMUSCULAR | Status: AC
  Filled 2022-07-31: qty 4

## 2022-07-31 MED ORDER — FENTANYL CITRATE (PF) 100 MCG/2ML IJ SOLN
INTRAMUSCULAR | Status: AC | PRN
  Administered 2022-07-31 (×2): 50 ug via INTRAVENOUS

## 2022-07-31 MED ORDER — LIDOCAINE-EPINEPHRINE 1 %-1:100000 IJ SOLN
INTRAMUSCULAR | Status: AC | PRN
  Administered 2022-07-31: 20 mL

## 2022-07-31 MED ORDER — SODIUM CHLORIDE 0.9 % IV SOLN: INTRAVENOUS | Status: DC

## 2022-07-31 MED ORDER — FENTANYL CITRATE (PF) 100 MCG/2ML IJ SOLN
INTRAMUSCULAR | Status: AC
  Filled 2022-07-31: qty 2

## 2022-07-31 NOTE — Discharge Instructions (Signed)
Discharge Instructions:   Please call Interventional Radiology clinic [phone] with any questions or concerns.  You may remove your Band-Aid and shower tomorrow.  Bone Marrow Aspiration and Bone Marrow Biopsy, Adult, Care After This sheet gives you information about how to care for yourself after your procedure. Your health care provider may also give you more specific instructions. If you have problems or questions, contact your health care provider. What can I expect after the procedure? After the procedure, it is common to have: Mild pain and tenderness. Swelling. Bruising. Follow these instructions at home: Puncture site care  Follow instructions from your health care provider about how to take care of the puncture site. Make sure you: Wash your hands with soap and water before and after you change your bandage (dressing). If soap and water are not available, use hand sanitizer. Change your dressing as told by your health care provider. Check your puncture site every day for signs of infection. Check for: More redness, swelling, or pain. Fluid or blood. Warmth. Pus or a bad smell. Activity Return to your normal activities as told by your health care provider. Ask your health care provider what activities are safe for you. Do not lift anything that is heavier than 10 lb (4.5 kg), or the limit that you are told, until your health care provider says that it is safe. Do not drive for 24 hours if you were given a sedative during your procedure. General instructions  Take over-the-counter and prescription medicines only as told by your health care provider. Do not take baths, swim, or use a hot tub until your health care provider approves. Ask your health care provider if you may take showers. You may only be allowed to take sponge baths. If directed, put ice on the affected area. To do this: Put ice in a plastic bag. Place a towel between your skin and the bag. Leave the ice on  for 20 minutes, 2-3 times a day. Keep all follow-up visits as told by your health care provider. This is important. Contact a health care provider if: Your pain is not controlled with medicine. You have a fever. You have more redness, swelling, or pain around the puncture site. You have fluid or blood coming from the puncture site. Your puncture site feels warm to the touch. You have pus or a bad smell coming from the puncture site. Summary After the procedure, it is common to have mild pain, tenderness, swelling, and bruising. Follow instructions from your health care provider about how to take care of the puncture site and what activities are safe for you. Take over-the-counter and prescription medicines only as told by your health care provider. Contact a health care provider if you have any signs of infection, such as fluid or blood coming from the puncture site. This information is not intended to replace advice given to you by your health care provider. Make sure you discuss any questions you have with your health care provider. Document Revised: 07/13/2018 Document Reviewed: 07/13/2018 Elsevier Patient Education  2023 [employer] Inc[employer]    Moderate Conscious Sedation, Adult, Care After This sheet gives you information about how to care for yourself after your procedure. Your health care provider may also give you more specific instructions. If you have problems or questions, contact your health care provider. What can I expect after the procedure? After the procedure, it is common to have: Sleepiness for several hours. Impaired judgment for several hours. Difficulty with balance. Vomiting if you  eat too soon. Follow these instructions at home: For the time period you were told by your health care provider: Rest. Do not participate in activities where you could fall or become injured. Do not drive or use machinery. Do not drink alcohol. Do not take sleeping pills or medicines that  cause drowsiness. Do not make important decisions or sign legal documents. Do not take care of children on your own. Eating and drinking  Follow the diet recommended by your health care provider. Drink enough fluid to keep your urine pale yellow. If you vomit: Drink water, juice, or soup when you can drink without vomiting. Make sure you have little or no nausea before eating solid foods. General instructions Take over-the-counter and prescription medicines only as told by your health care provider. Have a responsible adult stay with you for the time you are told. It is important to have someone help care for you until you are awake and alert. Do not smoke. Keep all follow-up visits as told by your health care provider. This is important. Contact a health care provider if: You are still sleepy or having trouble with balance after 24 hours. You feel light-headed. You keep feeling nauseous or you keep vomiting. You develop a rash. You have a fever. You have redness or swelling around the IV site. Get help right away if: You have trouble breathing. You have new-onset confusion at home. Summary After the procedure, it is common to feel sleepy, have impaired judgment, or feel nauseous if you eat too soon. Rest after you get home. Know the things you should not do after the procedure. Follow the diet recommended by your health care provider and drink enough fluid to keep your urine pale yellow. Get help right away if you have trouble breathing or new-onset confusion at home. This information is not intended to replace advice given to you by your health care provider. Make sure you discuss any questions you have with your health care provider. Document Revised: 06/24/2019 Document Reviewed: 01/20/2019 Elsevier Patient Education  2023 [employer]

## 2022-07-31 NOTE — Procedures (Signed)
Interventional Radiology Procedure Note  Procedure: CT guided aspirate and core biopsy of right iliac bone  Complications: None  Recommendations: - Bedrest supine x 1 hrs - Hydrocodone PRN  Pain - Follow biopsy results   Steven Basso, MD   

## 2022-08-05 LAB — SURGICAL PATHOLOGY

## 2022-08-12 ENCOUNTER — Encounter (HOSPITAL_COMMUNITY): Payer: Self-pay | Admitting: Internal Medicine

## 2022-08-12 ENCOUNTER — Inpatient Hospital Stay: Payer: Medicare Other | Attending: Internal Medicine | Admitting: Internal Medicine

## 2022-08-12 VITALS — BP 160/73 | HR 60 | Temp 98.0°F | Resp 18 | Ht 75.0 in | Wt 209.1 lb

## 2022-08-12 DIAGNOSIS — Z79899 Other long term (current) drug therapy: Secondary | ICD-10-CM | POA: Insufficient documentation

## 2022-08-12 DIAGNOSIS — I10 Essential (primary) hypertension: Secondary | ICD-10-CM | POA: Diagnosis not present

## 2022-08-12 DIAGNOSIS — D472 Monoclonal gammopathy: Secondary | ICD-10-CM | POA: Insufficient documentation

## 2022-08-12 DIAGNOSIS — Z8546 Personal history of malignant neoplasm of prostate: Secondary | ICD-10-CM | POA: Diagnosis not present

## 2022-08-12 DIAGNOSIS — Z8711 Personal history of peptic ulcer disease: Secondary | ICD-10-CM | POA: Diagnosis not present

## 2022-08-12 DIAGNOSIS — Z923 Personal history of irradiation: Secondary | ICD-10-CM | POA: Diagnosis not present

## 2022-08-12 DIAGNOSIS — Z888 Allergy status to other drugs, medicaments and biological substances status: Secondary | ICD-10-CM | POA: Diagnosis not present

## 2022-08-12 NOTE — Addendum Note (Signed)
Addended by: Charma Igo on: 08/12/2022 09:34 AM   Modules accepted: Orders

## 2022-08-12 NOTE — Progress Notes (Signed)
[facility] [phone]   [phone] OFFICE PROGRESS NOTE  [name], MD [address] [place] [address]  DIAGNOSIS:  1) Monoclonal gammopathy of undetermined significance (MGUS). 2) History of prostate cancer T1C followed initially by Dr. Brunilda Payor,  status post curative radiotherapy under the care of Dr. Kathrynn Running and Mena Goes.  PRIOR THERAPY: Curative radiotherapy to the prostate under the care of Dr. Dayton Scrape.  CURRENT THERAPY: Observation.  INTERVAL HISTORY: Ricky FABRY Sr. 77 y.o. male returns to the clinic today for follow-up visit accompanied by his wife.  The patient is feeling fine today with no concerning complaints.  He was found on previous myeloma panel to have rising free kappa light chain.  I recommended for the patient to have a bone marrow biopsy and aspirate that was performed recently and he is here for evaluation and discussion of his biopsy results.  The patient denied having any current chest pain, shortness of breath, cough or hemoptysis.  He has no nausea, vomiting, diarrhea or constipation.  He has no headache or visual changes.  He has no recent weight loss or night sweats.  He is currently on treatment with Orgovyx and Xtandi for his prostate cancer by Dr. Mena Goes.   MEDICAL HISTORY: Past Medical History:  Diagnosis Date   Borderline diabetes    DIET CONTROLLED   Dyslipidemia    Elevated PSA    External carotid artery stenosis    MILD-- BILATERAL   Fall 11/05/2017   GERD (gastroesophageal reflux disease)    Gout, arthritis    Hearing loss in left ear    Heart murmur    MILD --  ASYMPTOMATIC   History of gastric ulcer    REMOTE HX   Hypertension    Pre-diabetes    Prostate cancer (HCC) 12/21/2012   Gleason 3+4=7   Seizure-like activity (HCC)    Syncope and collapse    Wears glasses     ALLERGIES:  is allergic to ezetimibe and statins.  MEDICATIONS:  Current Outpatient Medications   Medication Sig Dispense Refill   acetaminophen (TYLENOL) 650 MG CR tablet Take 650 mg by mouth every 8 (eight) hours.     allopurinol (ZYLOPRIM) 300 MG tablet Take 300 mg by mouth daily.      amLODipine (NORVASC) 5 MG tablet Take 5 mg by mouth daily.  1   colchicine 0.6 MG tablet Take 0.6 mg by mouth 2 (two) times daily as needed (for gout).      Lidocaine 0.5 % AERO Apply topically.     meclizine (ANTIVERT) 25 MG tablet Take by mouth.     Menthol-Camphor (TIGER BALM EXTRA STRENGTH) 11-10 % OINT as directed Externally     Naphazoline HCl (CLEAR EYES OP) Place 1 drop into both eyes daily as needed (dry eyes).      omeprazole (PRILOSEC) 20 MG capsule Take 20 mg by mouth daily.     ORGOVYX 120 MG tablet Take 120 mg by mouth daily.     REPATHA SURECLICK 140 MG/ML SOAJ Inject 140 mg into the skin every 14 (fourteen) days.      tolnaftate (TINACTIN) 1 % cream Apply 1 application topically daily as needed (jock itch).     valsartan (DIOVAN) 320 MG tablet Take 320 mg by mouth daily.  0   No current facility-administered medications for this visit.    SURGICAL HISTORY:  Past Surgical History:  Procedure Laterality Date   KNEE ARTHROSCOPY  W/ MENISCAL REPAIR Left 03/10/2012   LOOP RECORDER INSERTION N/A 01/05/2018   Procedure: LOOP RECORDER INSERTION;  Surgeon: Duke Salvia, MD;  Location[facility] INVASIVE CV LAB;  Service: Cardiovascular;  Laterality: N/A;   LUMBAR SPINE SURGERY  03/10/1989   PROSTATE BIOPSY N/A 12/21/2012   Procedure: BIOPSY TRANSRECTAL ULTRASONIC PROSTATE (TUBP);  Surgeon: Lindaann Slough, MD;  Location: [facility];  Service: Urology;  Laterality: N/A;   PROSTATE BIOPSY  11/26/2011   benign   TONSILLECTOMY     WISDOM TOOTH EXTRACTION      REVIEW OF SYSTEMS:  Constitutional: negative Eyes: negative Ears, nose, mouth, throat, and face: negative Respiratory: negative Cardiovascular: negative Gastrointestinal:  negative Genitourinary:negative Integument/breast: negative Hematologic/lymphatic: negative Musculoskeletal:negative Neurological: negative Behavioral/Psych: negative Endocrine: negative Allergic/Immunologic: negative   PHYSICAL EXAMINATION: General appearance: alert, cooperative, and no distress Head: Normocephalic, without obvious abnormality, atraumatic Neck: no adenopathy, no JVD, supple, symmetrical, trachea midline, and thyroid not enlarged, symmetric, no tenderness/mass/nodules Lymph nodes: Cervical, supraclavicular, and axillary nodes normal. Resp: clear to auscultation bilaterally Back: symmetric, no curvature. ROM normal. No CVA tenderness. Cardio: regular rate and rhythm, S1, S2 normal, no murmur, click, rub or gallop GI: soft, non-tender; bowel sounds normal; no masses,  no organomegaly Extremities: extremities normal, atraumatic, no cyanosis or edema Neurologic: Alert and oriented X 3, normal strength and tone. Normal symmetric reflexes. Normal coordination and gait  ECOG PERFORMANCE STATUS: 1 - Symptomatic but completely ambulatory  Blood pressure (!) 160/73, pulse 60, temperature 98 F (36.7 C), temperature source Oral, resp. rate 18, height 6\' 3"  (1.905 m), weight 209 lb 1.6 oz (94.8 kg), SpO2 100 %.  LABORATORY DATA: Lab Results  Component Value Date   WBC 6.8 07/31/2022   HGB 11.8 (L) 07/31/2022   HCT 36.1 (L) 07/31/2022   MCV 98.9 07/31/2022   PLT 168 07/31/2022      Chemistry      Component Value Date/Time   NA 133 (L) 07/15/2022 0912   NA 140 06/18/2016 0857   K 4.3 07/15/2022 0912   K 4.2 06/18/2016 0857   CL 104 07/15/2022 0912   CO2 23 07/15/2022 0912   CO2 25 06/18/2016 0857   BUN 52 (H) 07/15/2022 0912   BUN 14.7 06/18/2016 0857   CREATININE 1.73 (H) 07/15/2022 0912   CREATININE 1.1 06/18/2016 0857      Component Value Date/Time   CALCIUM 9.3 07/15/2022 0912   CALCIUM 10.1 06/18/2016 0857   ALKPHOS 69 07/15/2022 0912   ALKPHOS 79  06/18/2016 0857   AST 22 07/15/2022 0912   AST 43 (H) 06/18/2016 0857   ALT 19 07/15/2022 0912   ALT 41 06/18/2016 0857   BILITOT 0.4 07/15/2022 0912   BILITOT 0.73 06/18/2016 0857     [place] lab results LTS FINAL DIAGNOSIS ASSESSMENT AND PLAN:  This is a very pleasant 77 years old African-American male with monoclonal gammopathy of undetermined significance and has been observation for several years.  The patient had repeat myeloma panel performed recently that showed further increase in the free kappa light chain concerning for disease progression. The patient had repeat bone marrow biopsy and aspirate recently.  It showed only 7% plasma cells.  I discussed the result with the patient I recommended for him to continue on observation with repeat myeloma panel in 6 months. Regarding the history of prostate cancer and bone metastasis, he is currently on treatment with Orgovyx and as well as Xtandi under the care of Dr. Mena Goes. He was  advised to call immediately if he has any other concerning symptoms in the interval. The patient voices understanding of current disease status and treatment options and is in agreement with the current care plan. All questions were answered. The patient knows to call the clinic with any problems, questions or concerns. We can certainly see the patient much sooner if necessary.  The total time spent in the appointment was 30 minutes.  Disclaimer: This note was dictated with voice recognition software. Similar sounding words can inadvertently be transcribed and may not be corrected upon review.

## 2022-08-21 ENCOUNTER — Other Ambulatory Visit: Payer: Self-pay | Admitting: Internal Medicine

## 2022-08-21 ENCOUNTER — Ambulatory Visit
Admission: RE | Admit: 2022-08-21 | Discharge: 2022-08-21 | Disposition: A | Discharge status: Home / Self Care | Payer: Medicare Other | Source: Ambulatory Visit | Attending: Internal Medicine | Admitting: Internal Medicine

## 2022-08-21 DIAGNOSIS — M25511 Pain in right shoulder: Secondary | ICD-10-CM

## 2022-08-21 DIAGNOSIS — M545 Low back pain, unspecified: Secondary | ICD-10-CM

## 2022-11-14 ENCOUNTER — Other Ambulatory Visit: Payer: Self-pay

## 2022-11-14 ENCOUNTER — Inpatient Hospital Stay (HOSPITAL_COMMUNITY)
Admission: EM | Admit: 2022-11-14 | Discharge: 2022-11-19 | DRG: 321 | Disposition: A | Discharge status: Home / Self Care | Payer: Medicare Other | Attending: Cardiovascular Disease | Admitting: Cardiovascular Disease

## 2022-11-14 ENCOUNTER — Emergency Department (HOSPITAL_COMMUNITY): Payer: Medicare Other

## 2022-11-14 ENCOUNTER — Encounter (HOSPITAL_COMMUNITY): Payer: Self-pay

## 2022-11-14 DIAGNOSIS — N1832 Chronic kidney disease, stage 3b: Secondary | ICD-10-CM | POA: Diagnosis present

## 2022-11-14 DIAGNOSIS — I5021 Acute systolic (congestive) heart failure: Secondary | ICD-10-CM | POA: Diagnosis not present

## 2022-11-14 DIAGNOSIS — I272 Pulmonary hypertension, unspecified: Secondary | ICD-10-CM | POA: Insufficient documentation

## 2022-11-14 DIAGNOSIS — K219 Gastro-esophageal reflux disease without esophagitis: Secondary | ICD-10-CM | POA: Diagnosis present

## 2022-11-14 DIAGNOSIS — N179 Acute kidney failure, unspecified: Secondary | ICD-10-CM | POA: Diagnosis present

## 2022-11-14 DIAGNOSIS — Z8546 Personal history of malignant neoplasm of prostate: Secondary | ICD-10-CM | POA: Diagnosis not present

## 2022-11-14 DIAGNOSIS — D472 Monoclonal gammopathy: Secondary | ICD-10-CM | POA: Diagnosis present

## 2022-11-14 DIAGNOSIS — I130 Hypertensive heart and chronic kidney disease with heart failure and stage 1 through stage 4 chronic kidney disease, or unspecified chronic kidney disease: Secondary | ICD-10-CM | POA: Diagnosis present

## 2022-11-14 DIAGNOSIS — I5022 Chronic systolic (congestive) heart failure: Secondary | ICD-10-CM | POA: Insufficient documentation

## 2022-11-14 DIAGNOSIS — Z87891 Personal history of nicotine dependence: Secondary | ICD-10-CM

## 2022-11-14 DIAGNOSIS — C61 Malignant neoplasm of prostate: Secondary | ICD-10-CM | POA: Diagnosis present

## 2022-11-14 DIAGNOSIS — I2510 Atherosclerotic heart disease of native coronary artery without angina pectoris: Secondary | ICD-10-CM | POA: Diagnosis present

## 2022-11-14 DIAGNOSIS — M109 Gout, unspecified: Secondary | ICD-10-CM | POA: Diagnosis present

## 2022-11-14 DIAGNOSIS — Z8249 Family history of ischemic heart disease and other diseases of the circulatory system: Secondary | ICD-10-CM | POA: Diagnosis not present

## 2022-11-14 DIAGNOSIS — N189 Chronic kidney disease, unspecified: Secondary | ICD-10-CM | POA: Insufficient documentation

## 2022-11-14 DIAGNOSIS — C7951 Secondary malignant neoplasm of bone: Secondary | ICD-10-CM | POA: Diagnosis present

## 2022-11-14 DIAGNOSIS — I444 Left anterior fascicular block: Secondary | ICD-10-CM | POA: Diagnosis present

## 2022-11-14 DIAGNOSIS — E785 Hyperlipidemia, unspecified: Secondary | ICD-10-CM | POA: Diagnosis present

## 2022-11-14 DIAGNOSIS — Z955 Presence of coronary angioplasty implant and graft: Secondary | ICD-10-CM

## 2022-11-14 DIAGNOSIS — Z888 Allergy status to other drugs, medicaments and biological substances status: Secondary | ICD-10-CM

## 2022-11-14 DIAGNOSIS — I214 Non-ST elevation (NSTEMI) myocardial infarction: Secondary | ICD-10-CM | POA: Diagnosis not present

## 2022-11-14 DIAGNOSIS — Z79899 Other long term (current) drug therapy: Secondary | ICD-10-CM | POA: Diagnosis not present

## 2022-11-14 DIAGNOSIS — R9431 Abnormal electrocardiogram [ECG] [EKG]: Secondary | ICD-10-CM | POA: Insufficient documentation

## 2022-11-14 DIAGNOSIS — I255 Ischemic cardiomyopathy: Secondary | ICD-10-CM | POA: Diagnosis present

## 2022-11-14 DIAGNOSIS — R079 Chest pain, unspecified: Secondary | ICD-10-CM | POA: Diagnosis present

## 2022-11-14 DIAGNOSIS — R001 Bradycardia, unspecified: Secondary | ICD-10-CM

## 2022-11-14 DIAGNOSIS — I25118 Atherosclerotic heart disease of native coronary artery with other forms of angina pectoris: Secondary | ICD-10-CM | POA: Insufficient documentation

## 2022-11-14 DIAGNOSIS — I779 Disorder of arteries and arterioles, unspecified: Secondary | ICD-10-CM | POA: Insufficient documentation

## 2022-11-14 DIAGNOSIS — I491 Atrial premature depolarization: Secondary | ICD-10-CM | POA: Insufficient documentation

## 2022-11-14 LAB — CBC
HCT: 30 % — ABNORMAL LOW (ref 39.0–52.0)
Hemoglobin: 10.1 g/dL — ABNORMAL LOW (ref 13.0–17.0)
MCH: 33.3 pg (ref 26.0–34.0)
MCHC: 33.7 g/dL (ref 30.0–36.0)
MCV: 99 fL (ref 80.0–100.0)
Platelets: 158 10*3/uL (ref 150–400)
RBC: 3.03 MIL/uL — ABNORMAL LOW (ref 4.22–5.81)
RDW: 13.2 % (ref 11.5–15.5)
WBC: 7 10*3/uL (ref 4.0–10.5)
nRBC: 0 % (ref 0.0–0.2)

## 2022-11-14 LAB — COMPREHENSIVE METABOLIC PANEL
ALT: 13 U/L (ref 0–44)
AST: 26 U/L (ref 15–41)
Albumin: 3.6 g/dL (ref 3.5–5.0)
Alkaline Phosphatase: 66 U/L (ref 38–126)
Anion gap: 14 (ref 5–15)
BUN: 39 mg/dL — ABNORMAL HIGH (ref 8–23)
CO2: 21 mmol/L — ABNORMAL LOW (ref 22–32)
Calcium: 9.5 mg/dL (ref 8.9–10.3)
Chloride: 102 mmol/L (ref 98–111)
Creatinine, Ser: 2.05 mg/dL — ABNORMAL HIGH (ref 0.61–1.24)
GFR, Estimated: 32 mL/min — ABNORMAL LOW (ref >60)
Glucose, Bld: 142 mg/dL — ABNORMAL HIGH (ref 70–99)
Potassium: 4 mmol/L (ref 3.5–5.1)
Sodium: 137 mmol/L (ref 135–145)
Total Bilirubin: 1 mg/dL (ref 0.3–1.2)
Total Protein: 7.3 g/dL (ref 6.5–8.1)

## 2022-11-14 LAB — PROTIME-INR
INR: 1 (ref 0.8–1.2)
Prothrombin Time: 13.6 seconds (ref 11.4–15.2)

## 2022-11-14 LAB — TROPONIN I (HIGH SENSITIVITY)
Troponin I (High Sensitivity): 144 ng/L (ref <18)
Troponin I (High Sensitivity): 2132 ng/L (ref <18)

## 2022-11-14 LAB — HEPARIN LEVEL (UNFRACTIONATED): Heparin Unfractionated: <0.1 [IU]/mL — ABNORMAL LOW (ref 0.30–0.70)

## 2022-11-14 LAB — APTT: aPTT: 30 s (ref 24–36)

## 2022-11-14 MED ORDER — NITROGLYCERIN 0.4 MG SL SUBL
0.4000 mg | SUBLINGUAL_TABLET | SUBLINGUAL | Status: AC
  Administered 2022-11-14: 0.4 mg via SUBLINGUAL
  Filled 2022-11-14: qty 1

## 2022-11-14 MED ORDER — ONDANSETRON HCL 4 MG/2ML IJ SOLN: 4.0000 mg | Freq: Four times a day (QID) | INTRAMUSCULAR | Status: DC | PRN

## 2022-11-14 MED ORDER — IRBESARTAN 300 MG PO TABS
300.0000 mg | ORAL_TABLET | Freq: Every day | ORAL | Status: DC
  Administered 2022-11-15 – 2022-11-16 (×2): 300 mg via ORAL
  Filled 2022-11-14: qty 2
  Filled 2022-11-14 (×2): qty 1

## 2022-11-14 MED ORDER — ALLOPURINOL 100 MG PO TABS: 300.0000 mg | ORAL_TABLET | Freq: Every day | ORAL | Status: DC

## 2022-11-14 MED ORDER — FENTANYL CITRATE PF 50 MCG/ML IJ SOSY
50.0000 ug | PREFILLED_SYRINGE | Freq: Once | INTRAMUSCULAR | Status: AC
  Administered 2022-11-14: 50 ug via INTRAVENOUS
  Filled 2022-11-14: qty 1

## 2022-11-14 MED ORDER — HEPARIN (PORCINE) 25000 UT/250ML-% IV SOLN
1250.0000 [IU]/h | INTRAVENOUS | Status: DC
  Administered 2022-11-14 – 2022-11-16 (×2): 1250 [IU]/h via INTRAVENOUS
  Filled 2022-11-14 (×3): qty 250

## 2022-11-14 MED ORDER — NITROGLYCERIN 0.4 MG SL SUBL
0.4000 mg | SUBLINGUAL_TABLET | SUBLINGUAL | Status: DC | PRN
  Administered 2022-11-15: 0.4 mg via SUBLINGUAL
  Filled 2022-11-14: qty 1

## 2022-11-14 MED ORDER — HEPARIN BOLUS VIA INFUSION
4000.0000 [IU] | Freq: Once | INTRAVENOUS | Status: AC
  Administered 2022-11-14: 4000 [IU] via INTRAVENOUS
  Filled 2022-11-14: qty 4000

## 2022-11-14 MED ORDER — ACETAMINOPHEN ER 650 MG PO TBCR: 650.0000 mg | EXTENDED_RELEASE_TABLET | Freq: Four times a day (QID) | ORAL | Status: DC | PRN

## 2022-11-14 MED ORDER — PANTOPRAZOLE SODIUM 40 MG PO TBEC: 40.0000 mg | DELAYED_RELEASE_TABLET | Freq: Every day | ORAL | Status: DC

## 2022-11-14 MED ORDER — ASPIRIN 81 MG PO TBEC
81.0000 mg | DELAYED_RELEASE_TABLET | Freq: Every day | ORAL | Status: DC
  Administered 2022-11-15 – 2022-11-19 (×5): 81 mg via ORAL
  Filled 2022-11-14 (×5): qty 1

## 2022-11-14 MED ORDER — AMLODIPINE BESYLATE-VALSARTAN 5-320 MG PO TABS: 1.0000 | ORAL_TABLET | Freq: Every day | ORAL | Status: DC

## 2022-11-14 MED ORDER — ALLOPURINOL 300 MG PO TABS
300.0000 mg | ORAL_TABLET | Freq: Every day | ORAL | Status: DC
  Administered 2022-11-15 – 2022-11-19 (×5): 300 mg via ORAL
  Filled 2022-11-14 (×2): qty 1
  Filled 2022-11-14: qty 3
  Filled 2022-11-14 (×2): qty 1

## 2022-11-14 MED ORDER — PANTOPRAZOLE SODIUM 40 MG PO TBEC
40.0000 mg | DELAYED_RELEASE_TABLET | Freq: Every day | ORAL | Status: DC
  Administered 2022-11-15 – 2022-11-19 (×5): 40 mg via ORAL
  Filled 2022-11-14 (×5): qty 1

## 2022-11-14 MED ORDER — ENZALUTAMIDE 40 MG PO CAPS
160.0000 mg | ORAL_CAPSULE | Freq: Every day | ORAL | Status: DC
  Administered 2022-11-16 – 2022-11-19 (×4): 160 mg via ORAL
  Filled 2022-11-14 (×5): qty 4

## 2022-11-14 MED ORDER — RELUGOLIX 120 MG PO TABS
120.0000 mg | ORAL_TABLET | Freq: Every day | ORAL | Status: DC
  Administered 2022-11-16 – 2022-11-19 (×4): 120 mg via ORAL
  Filled 2022-11-14 (×5): qty 1

## 2022-11-14 MED ORDER — AMLODIPINE BESYLATE 5 MG PO TABS
5.0000 mg | ORAL_TABLET | Freq: Every day | ORAL | Status: DC
  Administered 2022-11-15: 5 mg via ORAL
  Filled 2022-11-14: qty 1

## 2022-11-14 MED ORDER — ACETAMINOPHEN 325 MG PO TABS: 650.0000 mg | ORAL_TABLET | ORAL | Status: DC | PRN

## 2022-11-14 MED ORDER — METOPROLOL TARTRATE 25 MG PO TABS
25.0000 mg | ORAL_TABLET | Freq: Two times a day (BID) | ORAL | Status: DC
  Administered 2022-11-15: 25 mg via ORAL
  Filled 2022-11-14 (×2): qty 1

## 2022-11-14 NOTE — ED Notes (Signed)
Mae, in Lab, called about a troponin critical value for the PT. MD was notified.

## 2022-11-14 NOTE — ED Triage Notes (Signed)
Pt arrives via EMS from home. Pt reports intermittent chest pain for the past couple of weeks. The chest pain today, is more intense. Pt reports associated nausea. EMS administered 2 SL nitroglycerin with with minimal relief. Pt took 324mg  of aspirin at home. Pt AxOx4.

## 2022-11-14 NOTE — Progress Notes (Signed)
ANTICOAGULATION CONSULT NOTE - Initial Consult  Pharmacy Consult for heparin Indication: chest pain/ACS  Allergies  Allergen Reactions   Ezetimibe Other (See Comments)    Muscle aches   Statins Other (See Comments)    Joint pain    Patient Measurements: Height: 6\' 3"  (190.5 cm) Weight: 95.3 kg (210 lb) IBW/kg (Calculated) : 84.5 Heparin Dosing Weight: 95.3 kg  Vital Signs: Temp: 97.8 F (36.6 C) (09/06 1818) BP: 115/63 (09/06 2000) Pulse Rate: 75 (09/06 2000)  Labs: Recent Labs    11/14/22 1821  HGB 10.1*  HCT 30.0*  PLT 158  CREATININE 2.05*  TROPONINIHS 144*    Estimated Creatinine Clearance: 34.9 mL/min (A) (by C-G formula based on SCr of 2.05 mg/dL (H)).   Medical History: Past Medical History:  Diagnosis Date   Borderline diabetes    DIET CONTROLLED   Dyslipidemia    Elevated PSA    External carotid artery stenosis    MILD-- BILATERAL   Fall 11/05/2017   GERD (gastroesophageal reflux disease)    Gout, arthritis    Hearing loss in left ear    Heart murmur    MILD --  ASYMPTOMATIC   History of gastric ulcer    REMOTE HX   Hypertension    Pre-diabetes    Prostate cancer (HCC) 12/21/2012   Gleason 3+4=7   Seizure-like activity (HCC)    Syncope and collapse    Wears glasses     Assessment: Ricky Rowland. is a 77 y.o. male who presents with chest pain and associated diaphoresis and N/V. Not on anticoagulation prior to admission. Initial troponin elevated at 144. Hgb is low 10.1, will need to monitor trend. Platelets wnl and look to be at baseline. Of note: patient currently being treated for prostate cancer with bone metastasis on Orgovyx and Xtandi   Goal of Therapy:  Heparin level 0.3-0.7 units/ml Monitor platelets by anticoagulation protocol: Yes   Plan:  Give 4000 units bolus x 1 Start heparin infusion at 1250 units/hr Check anti-Xa level in 8 hours and daily while on heparin Continue to monitor H&H and platelets   Thank you  for allowing pharmacy to be a part of this patient's care.   Signe Colt, PharmD 11/14/2022 8:30 PM  **Pharmacist phone directory can be found on [email or web address] listed under [facility]**

## 2022-11-14 NOTE — H&P (Signed)
Cardiology Admission History and Physical   Patient ID: Ricky Rowland Sr. MRN: [id number]; DOB: [birth year]   Admission date: 11/14/2022  PCP:  [name], MD   [facility] Providers Cardiologist:  Charlton Haws, MD        Chief Complaint: Chest pain  Patient Profile:   Ricky Rowland. is a 77 y.o. male with monoclonal gammopathy of unknown significance (MGUS)-with progression with 7% plasma cells on recent biopsy on June/2024, metastatic prostate cancer on treatment, hypertension, hyperlipidemia, carotid artery stenosis, gout, GERD who is being seen 11/14/2022 for the evaluation of chest pain.  History of Present Illness:   Ricky Rowland. is a 77 y.o. male with monoclonal gammopathy of unknown significance (MGUS)-with progression with 7% plasma cells on recent biopsy on June/2024, metastatic prostate cancer on treatment, hypertension, hyperlipidemia, carotid artery stenosis, gout, GERD who is being seen 11/14/2022 for the evaluation of chest pain.  Patient presented with intermittent chest pain for the last couple of weeks (3 weeks), got more intense today associated with diaphoresis and nausea tests calling EMS who noted to have EKG diffuse ST depressions in anterior and inferior leads administered 2 nitroglycerin with minimal relief and aspirin and brought him to the ER.  In the ER he continued to have some pain and was given pain medication, vitally stable.  Repeat EKG in the ER showed improvement in ST depressions, has mainly nonspecific ST-T wave changes now.  Laboratory investigation showed troponin 144, creatinine up to 2.0 baseline creatinine is around 1.7 from 07/15/2022  Father and mother both had an MI and reports that his father had heart attack at the age of 74.   Currently, patient is stable, on heparin drip, 1/10 chest pain. Denies any DVT/PE symptoms, no SOB, euvolemic.  EKG by EMS shows significant anterolateral ST depressions, mild inferior ST  depression and mild aVR elevation (media tab)  Sees Dr. Graciela Husbands for presyncope/syncope, status post ILR in 2019 Echo 2019: Study Conclusions  - Left ventricle: The cavity size was normal. Wall thickness was    increased in a pattern of mild LVH. Systolic function was normal.    The estimated ejection fraction was in the range of 50% to 55%.    Wall motion was normal; there were no regional wall motion    abnormalities.  - Aortic valve: Transvalvular velocity was within the normal range.    There was no stenosis. There was no significant regurgitation.  - Mitral valve: There was trivial regurgitation. Valve area by    pressure half-time: 2.37 cm^2.  - Left atrium: The atrium was mildly to moderately dilated.  - Right ventricle: Systolic function was normal.  - Atrial septum: No defect or patent foramen ovale was identified.  - Tricuspid valve: There was mild regurgitation.  - Pulmonic valve: There was no significant regurgitation.  - Pulmonary arteries: Systolic pressure was mildly increased. PA    peak pressure: 35 mm Hg (S).   Past Medical History:  Diagnosis Date   Borderline diabetes    DIET CONTROLLED   Dyslipidemia    Elevated PSA    External carotid artery stenosis    MILD-- BILATERAL   Fall 11/05/2017   GERD (gastroesophageal reflux disease)    Gout, arthritis    Hearing loss in left ear    Heart murmur    MILD --  ASYMPTOMATIC   History of gastric ulcer    REMOTE HX   Hypertension    Pre-diabetes  Prostate cancer (HCC) 12/21/2012   Gleason 3+4=7   Seizure-like activity (HCC)    Syncope and collapse    Wears glasses     Past Surgical History:  Procedure Laterality Date   KNEE ARTHROSCOPY W/ MENISCAL REPAIR Left 03/10/2012   LOOP RECORDER INSERTION N/A 01/05/2018   Procedure: LOOP RECORDER INSERTION;  Surgeon: Duke Salvia, MD;  Location: [facility] INVASIVE CV LAB;  Service: Cardiovascular;  Laterality: N/A;   LUMBAR SPINE SURGERY  03/10/1989   PROSTATE BIOPSY  N/A 12/21/2012   Procedure: BIOPSY TRANSRECTAL ULTRASONIC PROSTATE (TUBP);  Surgeon: Lindaann Slough, MD;  Location: [facility];  Service: Urology;  Laterality: N/A;   PROSTATE BIOPSY  11/26/2011   benign   TONSILLECTOMY     WISDOM TOOTH EXTRACTION       Medications Prior to Admission: Prior to Admission medications   Medication Sig Start Date End Date Taking? Authorizing Provider  acetaminophen (TYLENOL) 650 MG CR tablet Take 650 mg by mouth every 8 (eight) hours as needed for pain.   Yes [provider]  allopurinol (ZYLOPRIM) 300 MG tablet Take 300 mg by mouth daily.    Yes [provider]  amLODipine-valsartan (EXFORGE) 5-320 MG tablet Take 1 tablet by mouth daily.   Yes [provider]  colchicine 0.6 MG tablet Take 0.6 mg by mouth 2 (two) times daily as needed (for gout).    Yes [provider]  Multiple Vitamins-Minerals (MULTIVITAMIN ADULT EXTRA C PO) Take 1 tablet by mouth daily. 07/24/22  Yes [provider]  Naphazoline HCl (CLEAR EYES OP) Place 1 drop into both eyes daily as needed (dry eyes).    Yes [provider]  omeprazole (PRILOSEC) 20 MG capsule Take 20 mg by mouth daily.   Yes [provider]  ORGOVYX 120 MG tablet Take 120 mg by mouth daily. 05/19/22  Yes [provider]  REPATHA SURECLICK 140 MG/ML SOAJ Inject 140 mg into the skin every 14 (fourteen) days.  06/07/17  Yes [provider]  tolnaftate (TINACTIN) 1 % cream Apply 1 application topically daily as needed (jock itch).   Yes [provider]  XTANDI 40 MG capsule Take 160 mg by mouth daily.   Yes [provider]  traMADol (ULTRAM) 50 MG tablet Take 50 mg by mouth every 6 (six) hours as needed for moderate pain. Patient not taking: Reported on 11/14/2022 08/20/22   [provider]     Allergies:    Allergies  Allergen Reactions   Ezetimibe Other (See Comments)    Muscle aches   Statins Other  (See Comments)    Joint pain    Social History:   Social History   Socioeconomic History   Marital status: Married    Spouse name: geraldine   Number of children: 3   Years of education: some college   Highest education level: Not on file  Occupational History   Occupation: retired  Tobacco Use   Smoking status: Former    Current packs/day: 0.00    Average packs/day: 1 pack/day for 30.0 years (30.0 ttl pk-yrs)    Types: Cigarettes    Start date: 12/16/1967    Quit date: 12/15/1997    Years since quitting: 24.9   Smokeless tobacco: Never  Vaping Use   Vaping status: Never Used  Substance and Sexual Activity   Alcohol use: Yes    Alcohol/week: 20.0 standard drinks of alcohol    Types: 20 Shots of liquor per week  Comment: OCCASIONAL, 12/28/17 stopped 11/05/17   Drug use: No   Sexual activity: Not on file  Other Topics Concern   Not on file  Social History Narrative   Lives with wife at home    caffeine use- occas, mostly decaf coffee   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    Social Determinants of Health   Financial Resource Strain: Not on file  Food Insecurity: No Food Insecurity (05/26/2022)   Hunger Vital Sign    Worried About Running Out of Food in the Last Year: Never true    Ran Out of Food in the Last Year: Never true  Transportation Needs: No Transportation Needs (05/26/2022)   PRAPARE - Administrator, Civil Service (Medical): No    Lack of Transportation (Non-Medical): No  Physical Activity: Not on file  Stress: Not on file  Social Connections: Not on file  Intimate Partner Violence: Not At Risk (05/26/2022)   Humiliation, Afraid, Rape, and Kick questionnaire    Fear of Current or Ex-Partner: No    Emotionally Abused: No    Physically Abused: No    Sexually Abused: No    Family History:   The patient's family history includes Cancer in his brother and sister; Heart disease in his father and mother.    ROS:  Please see the history of present illness.  All  other ROS reviewed and negative.     Physical Exam/Data:   Vitals:   11/14/22 1900 11/14/22 2000 11/14/22 2015 11/14/22 2100  BP: 124/72 115/63 107/62 121/64  Pulse: 77 75 72 81  Resp: 16 16 15 17   Temp:      SpO2: 100% 97% 97% 98%  Weight:      Height:       No intake or output data in the 24 hours ending 11/14/22 2133    11/14/2022    6:18 PM 08/12/2022    8:51 AM 07/31/2022    9:56 AM  Last 3 Weights  Weight (lbs) 210 lb 209 lb 1.6 oz 204 lb 9.4 oz  Weight (kg) 95.255 kg 94.847 kg 92.8 kg     Body mass index is 26.25 kg/m.  General:  Well nourished, well developed, in no acute distress HEENT: normal Neck: no JVD Vascular: No carotid bruits; Distal pulses 2+ bilaterally   Cardiac:  normal S1, S2; RRR; no murmur  Lungs:  clear to auscultation bilaterally, no wheezing, rhonchi or rales  Abd: soft, nontender, no hepatomegaly  Ext: no edema Musculoskeletal:  No deformities, BUE and BLE strength normal and equal Skin: warm and dry  Neuro:  CNs 2-12 intact, no focal abnormalities noted Psych:  Normal affect    EKG:  The ECG that was done was personally reviewed and demonstrates noted above  Relevant CV Studies:   Laboratory Data:  High Sensitivity Troponin:   Recent Labs  Lab 11/14/22 1821  TROPONINIHS 144*      Chemistry Recent Labs  Lab 11/14/22 1821  NA 137  K 4.0  CL 102  CO2 21*  GLUCOSE 142*  BUN 39*  CREATININE 2.05*  CALCIUM 9.5  GFRNONAA 32*  ANIONGAP 14    Recent Labs  Lab 11/14/22 1821  PROT 7.3  ALBUMIN 3.6  AST 26  ALT 13  ALKPHOS 66  BILITOT 1.0   Lipids No results for input(s): "CHOL", "TRIG", "HDL", "LABVLDL", "LDLCALC", "CHOLHDL" in the last 168 hours. Hematology Recent Labs  Lab 11/14/22 1821  WBC 7.0  RBC 3.03*  HGB 10.1*  HCT 30.0*  MCV 99.0  MCH 33.3  MCHC 33.7  RDW 13.2  PLT 158   Thyroid No results for input(s): "TSH", "FREET4" in the last 168 hours. BNPNo results for input(s): "BNP", "PROBNP" in the last 168  hours.  DDimer No results for input(s): "DDIMER" in the last 168 hours.   Radiology/Studies:  DG Chest 2 View  Result Date: 11/14/2022 CLINICAL DATA:  Chest pain, shortness of breath EXAM: CHEST - 2 VIEW COMPARISON:  Chest radiograph 12/14/2020 FINDINGS: The heart is borderline enlarged. The upper mediastinal contours are normal, allowing for rightward patient rotation There are patchy opacities in the lung bases on the lateral projection. There is no other focal airspace opacity. There is no pulmonary edema. There is no pleural effusion or pneumothorax. There is no acute osseous abnormality. IMPRESSION: Patchy opacities in the lung bases on the lateral projection could reflect atelectasis or infection in the correct clinical setting. Electronically Signed   By: Lesia Hausen M.D.   On: 11/14/2022 19:47     Assessment and Plan:   Chest pain/NSTEMI.  EKG by EMS shows significant anterolateral ST depressions, mild inferior ST depression and mild aVR elevation Hypertension, hyperlipidemia-intolerant to statins Metastatic prostate cancer on therapy MGUS progression, recent bone marrow biopsy shows a percent plasma cells CKD stage III-IV, baseline creatinine 1.7, now with AKI on CKD   Plan: -S/p aspirin in the ER, continue with aspirin 81 mg, start metoprolol 25 mg twice daily, intolerant to statin was on a PCSK9 inhibitor at home, recheck lipid panel, A1c C and labs in AM. -Continue heparin GTT  - liberal hydration tonight, and also consider IVF 75cc prior to cath -Continue to trend troponin, echocardiogram in a.m. -Has AKI on CKD, l  -If he is having worsening chest pain then we might have to think of doing LHC/catheter over the weekend Aggressive risk factor modification, continue home medications for his prostate cancer.   Full code  Risk Assessment/Risk Scores:    TIMI Risk Score for Unstable Angina or Non-ST Elevation MI:   The patient's TIMI risk score is 6, which indicates a 41%  risk of all cause mortality, new or recurrent myocardial infarction or need for urgent revascularization in the next 14 days.      Code Status: Full Code  Severity of Illness: The appropriate patient status for this patient is INPATIENT. Inpatient status is judged to be reasonable and necessary in order to provide the required intensity of service to ensure the patient's safety. The patient's presenting symptoms, physical exam findings, and initial radiographic and laboratory data in the context of their chronic comorbidities is felt to place them at high risk for further clinical deterioration. Furthermore, it is not anticipated that the patient will be medically stable for discharge from the hospital within 2 midnights of admission.   * I certify that at the point of admission it is my clinical judgment that the patient will require inpatient hospital care spanning beyond 2 midnights from the point of admission due to high intensity of service, high risk for further deterioration and high frequency of surveillance required.*   For questions or updates, please contact [facility] Please consult [email or web address] for contact info under     Signed, Elmon Kirschner, MD  11/14/2022 9:33 PM

## 2022-11-14 NOTE — ED Provider Notes (Signed)
[place] EMERGENCY DEPARTMENT AT [facility] Provider Note   CSN: [id number] Arrival date & time: 11/14/22  1814     History  Chief Complaint  Patient presents with   Chest Pain    Ricky Rowland. is a 77 y.o. male.  77 year old male with a history of hypertension, hyperlipidemia, and prostate cancer currently on therapy who presents to the emergency department with chest discomfort.  Patient reports that for the past 3 weeks at night when he is gone to bed he has been awoken from his sleep with chest discomfort.  3 hours ago he started having severe chest pain.  Describes it as substernal sensation that feels like reflux and did radiate to his right shoulder.  Had an episode of emesis as well as diaphoresis with it.  Did not attempt to ambulate.  Was given 324 of aspirin by EMS and nitroglycerin which she says improved the pain but is still currently a 6/10 in severity.  No personal history of MI.  No history of smoking.  Father and mother both had an MI and reports that his father had heart attack at the age of 19.       Home Medications Prior to Admission medications   Medication Sig Start Date End Date Taking? Authorizing Provider  acetaminophen (TYLENOL) 650 MG CR tablet Take 650 mg by mouth every 8 (eight) hours as needed for pain.   Yes [provider]  allopurinol (ZYLOPRIM) 300 MG tablet Take 300 mg by mouth daily.    Yes [provider]  amLODipine-valsartan (EXFORGE) 5-320 MG tablet Take 1 tablet by mouth daily.   Yes [provider]  colchicine 0.6 MG tablet Take 0.6 mg by mouth 2 (two) times daily as needed (for gout).    Yes [provider]  Multiple Vitamins-Minerals (MULTIVITAMIN ADULT EXTRA C PO) Take 1 tablet by mouth daily. 07/24/22  Yes [provider]  Naphazoline HCl (CLEAR EYES OP) Place 1 drop into both eyes daily as needed (dry eyes).    Yes [provider]  omeprazole (PRILOSEC) 20 MG  capsule Take 20 mg by mouth daily.   Yes [provider]  ORGOVYX 120 MG tablet Take 120 mg by mouth daily. 05/19/22  Yes [provider]  REPATHA SURECLICK 140 MG/ML SOAJ Inject 140 mg into the skin every 14 (fourteen) days.  06/07/17  Yes [provider]  tolnaftate (TINACTIN) 1 % cream Apply 1 application topically daily as needed (jock itch).   Yes [provider]  XTANDI 40 MG capsule Take 160 mg by mouth daily.   Yes [provider]  traMADol (ULTRAM) 50 MG tablet Take 50 mg by mouth every 6 (six) hours as needed for moderate pain. Patient not taking: Reported on 11/14/2022 08/20/22   [provider]      Allergies    Ezetimibe and Statins    Review of Systems   Review of Systems  Physical Exam Updated Vital Signs BP 121/64   Pulse 81   Temp 97.8 F (36.6 C)   Resp 17   Ht 6\' 3"  (1.905 m)   Wt 95.3 kg   SpO2 98%   BMI 26.25 kg/m  Physical Exam Vitals and nursing note reviewed.  Constitutional:      General: He is not in acute distress.    Appearance: He is well-developed.  HENT:     Head: Normocephalic and atraumatic.     Right Ear: External ear normal.  Left Ear: External ear normal.     Nose: Nose normal.  Eyes:     Extraocular Movements: Extraocular movements intact.     Conjunctiva/sclera: Conjunctivae normal.     Pupils: Pupils are equal, round, and reactive to light.  Cardiovascular:     Rate and Rhythm: Normal rate and regular rhythm.     Heart sounds: Normal heart sounds.  Pulmonary:     Effort: Pulmonary effort is normal. No respiratory distress.     Breath sounds: Normal breath sounds.  Abdominal:     General: There is no distension.     Palpations: Abdomen is soft. There is no mass.     Tenderness: There is no abdominal tenderness. There is no guarding.  Musculoskeletal:     Cervical back: Normal range of motion and neck supple.     Right lower leg: Edema (1+) present.     Left lower leg: Edema  (1+) present.  Skin:    General: Skin is warm and dry.  Neurological:     Mental Status: He is alert. Mental status is at baseline.  Psychiatric:        Mood and Affect: Mood normal.        Behavior: Behavior normal.     ED Results / Procedures / Treatments   Labs (all labs ordered are listed, but only abnormal results are displayed) Labs Reviewed  CBC - Abnormal; Notable for the following components:      Result Value   RBC 3.03 (*)    Hemoglobin 10.1 (*)    HCT 30.0 (*)    All other components within normal limits  COMPREHENSIVE METABOLIC PANEL - Abnormal; Notable for the following components:   CO2 21 (*)    Glucose, Bld 142 (*)    BUN 39 (*)    Creatinine, Ser 2.05 (*)    GFR, Estimated 32 (*)    All other components within normal limits  HEPARIN LEVEL (UNFRACTIONATED) - Abnormal; Notable for the following components:   Heparin Unfractionated <0.10 (*)    All other components within normal limits  TROPONIN I (HIGH SENSITIVITY) - Abnormal; Notable for the following components:   Troponin I (High Sensitivity) 144 (*)    All other components within normal limits  APTT  PROTIME-INR  HEPARIN LEVEL (UNFRACTIONATED)  LIPOPROTEIN A (LPA)  BASIC METABOLIC PANEL  LIPID PANEL  CBC  TROPONIN I (HIGH SENSITIVITY)    EKG EKG Interpretation Date/Time:  Friday November 14 2022 18:17:46 EDT Ventricular Rate:  89 PR Interval:  155 QRS Duration:  100 QT Interval:  416 QTC Calculation: 507 R Axis:   -44  Text Interpretation: Sinus rhythm Atrial premature complexes Left anterior fascicular block Abnormal R-wave progression, early transition Left ventricular hypertrophy Prolonged QT interval Confirmed by Vonita Moss [phone]) on 11/14/2022 6:57:02 PM  Radiology DG Chest 2 View  Result Date: 11/14/2022 CLINICAL DATA:  Chest pain, shortness of breath EXAM: CHEST - 2 VIEW COMPARISON:  Chest radiograph 12/14/2020 FINDINGS: The heart is borderline enlarged. The upper mediastinal  contours are normal, allowing for rightward patient rotation There are patchy opacities in the lung bases on the lateral projection. There is no other focal airspace opacity. There is no pulmonary edema. There is no pleural effusion or pneumothorax. There is no acute osseous abnormality. IMPRESSION: Patchy opacities in the lung bases on the lateral projection could reflect atelectasis or infection in the correct clinical setting. Electronically Signed   By: Lesia Hausen M.D.   On:  11/14/2022 19:47    Procedures Procedures    Medications Ordered in ED Medications  heparin ADULT infusion 100 units/mL (25000 units/284mL) (1,250 Units/hr Intravenous New Bag/Given 11/14/22 2046)  relugolix (ORGOVYX) tablet 120 mg (has no administration in time range)  enzalutamide (XTANDI) capsule 160 mg (has no administration in time range)  aspirin EC tablet 81 mg (has no administration in time range)  nitroGLYCERIN (NITROSTAT) SL tablet 0.4 mg (has no administration in time range)  acetaminophen (TYLENOL) tablet 650 mg (has no administration in time range)  ondansetron (ZOFRAN) injection 4 mg (has no administration in time range)  metoprolol tartrate (LOPRESSOR) tablet 25 mg (has no administration in time range)  allopurinol (ZYLOPRIM) tablet 300 mg (has no administration in time range)  amLODipine (NORVASC) tablet 5 mg (has no administration in time range)  irbesartan (AVAPRO) tablet 300 mg (has no administration in time range)  pantoprazole (PROTONIX) EC tablet 40 mg (has no administration in time range)  nitroGLYCERIN (NITROSTAT) SL tablet 0.4 mg (0.4 mg Sublingual Given 11/14/22 1915)  fentaNYL (SUBLIMAZE) injection 50 mcg (50 mcg Intravenous Given 11/14/22 1915)  fentaNYL (SUBLIMAZE) injection 50 mcg (50 mcg Intravenous Given 11/14/22 1958)  heparin bolus via infusion 4,000 Units (4,000 Units Intravenous Bolus from Bag 11/14/22 2047)    ED Course/ Medical Decision Making/ A&P Clinical Course as of 11/14/22 2142   Fri Nov 14, 2022  2111 Dr Brayton Layman to admit the pt [RP]    Clinical Course User Index [RP] Rondel Baton, MD                                 Medical Decision Making Amount and/or Complexity of Data Reviewed Labs: ordered. Radiology: ordered.  Risk Prescription drug management. Decision regarding hospitalization.   Ricky GIANCARLO Sr. is a 77 y.o. male with comorbidities that complicate the patient evaluation including hypertension, hyperlipidemia, prostate cancer who presents emergency department with chest discomfort  Initial Ddx:  MI, PE, pericardial effusion, dissection  MDM/Course:  Patient presents to the emergency department with several hours of chest discomfort.  Does have some very concerning features including the fact that it does radiate and was accompanied by diaphoresis and vomiting.  His EKG from EMS did show some ST depressions in the anterolateral leads.  These appear to have improved here in the emergency department.  Was already given aspirin and nitroglycerin which did improve his pain.  Was given additional dose of nitroglycerin and fentanyl for persistent pain and upon re-evaluation his chest pain had resolved.  Troponin here is elevated 144.  Was discussed with cardiology after being started on heparin drip due to concerns for an NSTEMI.  They will admit the patient for further management.  This patient presents to the ED for concern of complaints listed in HPI, this involves an extensive number of treatment options, and is a complaint that carries with it a high risk of complications and morbidity. Disposition including potential need for admission considered.   Dispo: Admit to Floor  Additional history obtained from family Records reviewed Outpatient Clinic Notes The following labs were independently interpreted: Chemistry and show AKI and CKD I independently reviewed the following imaging with scope of interpretation limited to determining acute  life threatening conditions related to emergency care: Chest x-ray and agree with the radiologist interpretation with the following exceptions: none I personally reviewed and interpreted cardiac monitoring: normal sinus rhythm  I personally reviewed and interpreted  the pt's EKG: see above for interpretation  I have reviewed the patients home medications and made adjustments as needed Consults: Cardiology Social Determinants of health:  Elderly  CRITICAL CARE Performed by: Rondel Baton   Total critical care time: 45 minutes  Critical care time was exclusive of separately billable procedures and treating other patients.  Critical care was necessary to treat or prevent imminent or life-threatening deterioration.  Critical care was time spent personally by me on the following activities: development of treatment plan with patient and/or surrogate as well as nursing, discussions with consultants, evaluation of patient's response to treatment, examination of patient, obtaining history from patient or surrogate, ordering and performing treatments and interventions, ordering and review of laboratory studies, ordering and review of radiographic studies, pulse oximetry and re-evaluation of patient's condition.  Final Clinical Impression(s) / ED Diagnoses Final diagnoses:  NSTEMI (non-ST elevated myocardial infarction) [facility])    Rx / DC Orders ED Discharge Orders     None         Rondel Baton, MD 11/14/22 2142

## 2022-11-15 ENCOUNTER — Inpatient Hospital Stay (HOSPITAL_COMMUNITY): Payer: Medicare Other

## 2022-11-15 ENCOUNTER — Encounter (HOSPITAL_COMMUNITY): Payer: Self-pay | Admitting: Cardiovascular Disease

## 2022-11-15 DIAGNOSIS — I214 Non-ST elevation (NSTEMI) myocardial infarction: Principal | ICD-10-CM

## 2022-11-15 LAB — CBC
HCT: 28.1 % — ABNORMAL LOW (ref 39.0–52.0)
Hemoglobin: 9.5 g/dL — ABNORMAL LOW (ref 13.0–17.0)
MCH: 33.2 pg (ref 26.0–34.0)
MCHC: 33.8 g/dL (ref 30.0–36.0)
MCV: 98.3 fL (ref 80.0–100.0)
Platelets: 151 10*3/uL (ref 150–400)
RBC: 2.86 MIL/uL — ABNORMAL LOW (ref 4.22–5.81)
RDW: 13.3 % (ref 11.5–15.5)
WBC: 6 10*3/uL (ref 4.0–10.5)
nRBC: 0 % (ref 0.0–0.2)

## 2022-11-15 LAB — TROPONIN I (HIGH SENSITIVITY): Troponin I (High Sensitivity): 5707 ng/L (ref <18)

## 2022-11-15 LAB — BASIC METABOLIC PANEL
Anion gap: 11 (ref 5–15)
BUN: 39 mg/dL — ABNORMAL HIGH (ref 8–23)
CO2: 23 mmol/L (ref 22–32)
Calcium: 9.4 mg/dL (ref 8.9–10.3)
Chloride: 102 mmol/L (ref 98–111)
Creatinine, Ser: 1.95 mg/dL — ABNORMAL HIGH (ref 0.61–1.24)
GFR, Estimated: 34 mL/min — ABNORMAL LOW (ref >60)
Glucose, Bld: 155 mg/dL — ABNORMAL HIGH (ref 70–99)
Potassium: 3.8 mmol/L (ref 3.5–5.1)
Sodium: 136 mmol/L (ref 135–145)

## 2022-11-15 LAB — ECHOCARDIOGRAM COMPLETE
Area-P 1/2: 2.54 cm2
Height: 75 in
S' Lateral: 3.4 cm
Weight: 3360 [oz_av]

## 2022-11-15 LAB — LIPID PANEL
Cholesterol: 229 mg/dL — ABNORMAL HIGH (ref 0–200)
HDL: 51 mg/dL (ref >40)
LDL Cholesterol: 117 mg/dL — ABNORMAL HIGH (ref 0–99)
Total CHOL/HDL Ratio: 4.5 ratio
Triglycerides: 307 mg/dL — ABNORMAL HIGH (ref <150)
VLDL: 61 mg/dL — ABNORMAL HIGH (ref 0–40)

## 2022-11-15 LAB — HEPARIN LEVEL (UNFRACTIONATED)
Heparin Unfractionated: 0.48 [IU]/mL (ref 0.30–0.70)
Heparin Unfractionated: 0.41 [IU]/mL (ref 0.30–0.70)

## 2022-11-15 MED ORDER — SODIUM CHLORIDE 0.9 % IV SOLN: INTRAVENOUS | Status: DC

## 2022-11-15 MED ORDER — ASPIRIN 81 MG PO CHEW: 81.0000 mg | CHEWABLE_TABLET | ORAL | Status: DC

## 2022-11-15 MED ORDER — METOPROLOL TARTRATE 50 MG PO TABS
50.0000 mg | ORAL_TABLET | Freq: Two times a day (BID) | ORAL | Status: DC
  Administered 2022-11-15 – 2022-11-16 (×2): 50 mg via ORAL
  Filled 2022-11-15 (×2): qty 1

## 2022-11-15 NOTE — Progress Notes (Signed)
ANTICOAGULATION CONSULT NOTE  Pharmacy Consult for heparin Indication: chest pain/ACS  Allergies  Allergen Reactions   Ezetimibe Other (See Comments)    Muscle aches   Statins Other (See Comments)    Joint pain    Patient Measurements: Height: 6\' 3"  (190.5 cm) Weight: 95.3 kg (210 lb) IBW/kg (Calculated) : 84.5 Heparin Dosing Weight: 95.3 kg  Vital Signs: Temp: 97.8 F (36.6 C) (09/07 0300) Temp Source: Oral (09/07 0300) BP: 136/74 (09/07 0500) Pulse Rate: 63 (09/07 0500)  Labs: Recent Labs    11/14/22 1821 11/14/22 2032 11/14/22 2035 11/15/22 0039  HGB 10.1*  --   --  9.5*  HCT 30.0*  --   --  28.1*  PLT 158  --   --  151  APTT  --  30  --   --   LABPROT  --  13.6  --   --   INR  --  1.0  --   --   HEPARINUNFRC  --  <0.10*  --   --   CREATININE 2.05*  --   --  1.95*  TROPONINIHS 144*  --  2,132* 5,707*    Estimated Creatinine Clearance: 36.7 mL/min (A) (by C-G formula based on SCr of 1.95 mg/dL (H)).   Medical History: Past Medical History:  Diagnosis Date   Borderline diabetes    DIET CONTROLLED   Dyslipidemia    Elevated PSA    External carotid artery stenosis    MILD-- BILATERAL   Fall 11/05/2017   GERD (gastroesophageal reflux disease)    Gout, arthritis    Hearing loss in left ear    Heart murmur    MILD --  ASYMPTOMATIC   History of gastric ulcer    REMOTE HX   Hypertension    Pre-diabetes    Prostate cancer (HCC) 12/21/2012   Gleason 3+4=7   Seizure-like activity (HCC)    Syncope and collapse    Wears glasses     Assessment: Ricky Rowland. is a 77 y.o. male who presents with chest pain, diaphoretic and N/V found to have NSTEMI. No anticoagulation prior to admission. Pharmacy consulted for heparin dosing.   Heparin level 0.48, therapeutic Currently running appropriately. Per nurse, minor bleeding/leakage around the IV site, but controlled at this time.   Goal of Therapy:  Heparin level 0.3-0.7 units/ml Monitor platelets by  anticoagulation protocol: Yes   Plan:  Continue heparin infusion at 1250 units/hr 8 heparin level  Daily heparin level, CBC, and monitoring for bleeding F/u plans for anticoagulation and cath    Thank you for allowing pharmacy to participate in this patient's care.  Marja Kays, PharmD Emergency Medicine Clinical Pharmacist 11/15/2022,6:20 AM

## 2022-11-15 NOTE — Plan of Care (Signed)
  Problem: Education: Goal: Understanding of cardiac disease, CV risk reduction, and recovery process will improve Outcome: Progressing Goal: Individualized Educational Video(s) Outcome: Progressing   Problem: Activity: Goal: Ability to tolerate increased activity will improve Outcome: Progressing   

## 2022-11-15 NOTE — Plan of Care (Signed)

## 2022-11-15 NOTE — Progress Notes (Signed)
  Echocardiogram 2D Echocardiogram has been performed.  Delcie Roch 11/15/2022, 8:57 AM

## 2022-11-15 NOTE — ED Notes (Signed)
ED TO INPATIENT HANDOFF REPORT  ED Nurse Name and Phone #: Percival Spanish [phone]  S Name/Age/Gender Ricky Panther Sr. 77 y.o. male Room/Bed: 002C/002C  Code Status   Code Status: Full Code  Home/SNF/Other Home Patient oriented to: self, place, time, and situation Is this baseline? Yes   Triage Complete: Triage complete  Chief Complaint NSTEMI (non-ST elevated myocardial infarction) [facility]) [I21.4]  Triage Note Pt arrives via EMS from home. Pt reports intermittent chest pain for the past couple of weeks. The chest pain today, is more intense. Pt reports associated nausea. EMS administered 2 SL nitroglycerin with with minimal relief. Pt took 324mg  of aspirin at home. Pt AxOx4.     Allergies Allergies  Allergen Reactions   Ezetimibe Other (See Comments)    Muscle aches   Statins Other (See Comments)    Joint pain    Level of Care/Admitting Diagnosis ED Disposition     ED Disposition  Admit   Condition  --   Comment  Hospital Area: [facility] [[id number]]  Level of Care: Telemetry Cardiac [[id number]]  May admit patient to [facility] or [facility] if equivalent level of care is available:: Yes  Covid Evaluation: Asymptomatic - no recent exposure (last 10 days) testing not required  Diagnosis: NSTEMI (non-ST elevated myocardial infarction) [facility]) [[id number]]  Admitting Physician: Thurmon Fair [[id number]]  Attending Physician: Guy Begin  Certification:: I certify this patient will need inpatient services for at least 2 midnights  Expected Medical Readiness: 11/19/2022          B Medical/Surgery History Past Medical History:  Diagnosis Date   Borderline diabetes    DIET CONTROLLED   Dyslipidemia    Elevated PSA    External carotid artery stenosis    MILD-- BILATERAL   Fall 11/05/2017   GERD (gastroesophageal reflux disease)    Gout, arthritis    Hearing loss in left ear    Heart murmur    MILD --  ASYMPTOMATIC   History of gastric ulcer     REMOTE HX   Hypertension    Pre-diabetes    Prostate cancer (HCC) 12/21/2012   Gleason 3+4=7   Seizure-like activity (HCC)    Syncope and collapse    Wears glasses    Past Surgical History:  Procedure Laterality Date   KNEE ARTHROSCOPY W/ MENISCAL REPAIR Left 03/10/2012   LOOP RECORDER INSERTION N/A 01/05/2018   Procedure: LOOP RECORDER INSERTION;  Surgeon: Duke Salvia, MD;  Location: [facility] INVASIVE CV LAB;  Service: Cardiovascular;  Laterality: N/A;   LUMBAR SPINE SURGERY  03/10/1989   PROSTATE BIOPSY N/A 12/21/2012   Procedure: BIOPSY TRANSRECTAL ULTRASONIC PROSTATE (TUBP);  Surgeon: Lindaann Slough, MD;  Location: [facility];  Service: Urology;  Laterality: N/A;   PROSTATE BIOPSY  11/26/2011   benign   TONSILLECTOMY     WISDOM TOOTH EXTRACTION       A IV Location/Drains/Wounds Patient Lines/Drains/Airways Status     Active Line/Drains/Airways     Name Placement date Placement time Site Days   Peripheral IV 11/14/22 20 G Right Antecubital 11/14/22  1821  Antecubital  1            Intake/Output Last 24 hours No intake or output data in the 24 hours ending 11/15/22 1143  Labs/Imaging Results for orders placed or performed during the hospital encounter of 11/14/22 (from the past 48 hour(s))  CBC     Status: Abnormal   Collection Time: 11/14/22  6:21  PM  Result Value Ref Range   WBC 7.0 4.0 - 10.5 K/uL   RBC 3.03 (L) 4.22 - 5.81 MIL/uL   Hemoglobin 10.1 (L) 13.0 - 17.0 g/dL   HCT 86.5 (L) 78.4 - 69.6 %   MCV 99.0 80.0 - 100.0 fL   MCH 33.3 26.0 - 34.0 pg   MCHC 33.7 30.0 - 36.0 g/dL   RDW 29.5 28.4 - 13.2 %   Platelets 158 150 - 400 K/uL   nRBC 0.0 0.0 - 0.2 %    Comment: Performed at [facility], [address], [place], [place] [address]  Troponin I (High Sensitivity)     Status: Abnormal   Collection Time: 11/14/22  6:21 PM  Result Value Ref Range   Troponin I (High Sensitivity) 144 (HH) <18 ng/L    Comment: CRITICAL RESULT  CALLED TO, READ BACK BY AND VERIFIED WITH BOYD, RACHEL PARAMEDIC @1949  ON 11/14/22 BY MAB MT (NOTE) Elevated high sensitivity troponin I (hsTnI) values and significant  changes across serial measurements may suggest ACS but many other  chronic and acute conditions are known to elevate hsTnI results.  Refer to the "Links" section for chest pain algorithms and additional  guidance. Performed at [facility], [address], [place], [place] [address]   Comprehensive metabolic panel     Status: Abnormal   Collection Time: 11/14/22  6:21 PM  Result Value Ref Range   Sodium 137 135 - 145 mmol/L   Potassium 4.0 3.5 - 5.1 mmol/L   Chloride 102 98 - 111 mmol/L   CO2 21 (L) 22 - 32 mmol/L   Glucose, Bld 142 (H) 70 - 99 mg/dL    Comment: Glucose reference range applies only to samples taken after fasting for at least 8 hours.   BUN 39 (H) 8 - 23 mg/dL   Creatinine, Ser 6.64 (H) 0.61 - 1.24 mg/dL   Calcium 9.5 8.9 - 40.3 mg/dL   Total Protein 7.3 6.5 - 8.1 g/dL   Albumin 3.6 3.5 - 5.0 g/dL   AST 26 15 - 41 U/L   ALT 13 0 - 44 U/L   Alkaline Phosphatase 66 38 - 126 U/L   Total Bilirubin 1.0 0.3 - 1.2 mg/dL   GFR, Estimated 32 (L) >60 mL/min    Comment: (NOTE) Calculated using the CKD-EPI Creatinine Equation (2021)    Anion gap 14 5 - 15    Comment: Performed at [facility], [address] [place], [place] [address]  APTT     Status: None   Collection Time: 11/14/22  8:32 PM  Result Value Ref Range   aPTT 30 24 - 36 seconds    Comment: Performed at [facility], [address], [place], [place] [address]  Protime-INR     Status: None   Collection Time: 11/14/22  8:32 PM  Result Value Ref Range   Prothrombin Time 13.6 11.4 - 15.2 seconds   INR 1.0 0.8 - 1.2    Comment: (NOTE) INR goal varies based on device and disease states. Performed at [facility], [address] [place], [place] [address]   Heparin level (unfractionated)     Status: Abnormal    Collection Time: 11/14/22  8:32 PM  Result Value Ref Range   Heparin Unfractionated <0.10 (L) 0.30 - 0.70 IU/mL    Comment: (NOTE) The clinical reportable range upper limit is being lowered to >1.10 to align with the FDA approved  guidance for the current laboratory assay.  If heparin results are below expected values, and patient dosage has  been confirmed, suggest follow up testing of antithrombin III levels. Performed at [facility], [address] [place], [place] [address]   Troponin I (High Sensitivity)     Status: Abnormal   Collection Time: 11/14/22  8:35 PM  Result Value Ref Range   Troponin I (High Sensitivity) 2,132 (HH) <18 ng/L    Comment: CRITICAL VALUE NOTED. VALUE IS CONSISTENT WITH PREVIOUSLY REPORTED/CALLED VALUE (NOTE) Elevated high sensitivity troponin I (hsTnI) values and significant  changes across serial measurements may suggest ACS but many other  chronic and acute conditions are known to elevate hsTnI results.  Refer to the "Links" section for chest pain algorithms and additional  guidance. Performed at [facility], [address], [place], [place] [address]   Basic metabolic panel     Status: Abnormal   Collection Time: 11/15/22 12:39 AM  Result Value Ref Range   Sodium 136 135 - 145 mmol/L   Potassium 3.8 3.5 - 5.1 mmol/L   Chloride 102 98 - 111 mmol/L   CO2 23 22 - 32 mmol/L   Glucose, Bld 155 (H) 70 - 99 mg/dL    Comment: Glucose reference range applies only to samples taken after fasting for at least 8 hours.   BUN 39 (H) 8 - 23 mg/dL   Creatinine, Ser 8.29 (H) 0.61 - 1.24 mg/dL   Calcium 9.4 8.9 - 56.2 mg/dL   GFR, Estimated 34 (L) >60 mL/min    Comment: (NOTE) Calculated using the CKD-EPI Creatinine Equation (2021)    Anion gap 11 5 - 15    Comment: Performed at [facility], [address], [place], [place] [address]  CBC     Status: Abnormal   Collection Time: 11/15/22 12:39 AM  Result Value Ref Range   WBC 6.0 4.0  - 10.5 K/uL   RBC 2.86 (L) 4.22 - 5.81 MIL/uL   Hemoglobin 9.5 (L) 13.0 - 17.0 g/dL   HCT 57.8 (L) 46.9 - 62.9 %   MCV 98.3 80.0 - 100.0 fL   MCH 33.2 26.0 - 34.0 pg   MCHC 33.8 30.0 - 36.0 g/dL   RDW 52.8 41.3 - 24.4 %   Platelets 151 150 - 400 K/uL   nRBC 0.0 0.0 - 0.2 %    Comment: Performed at [facility], [address], [place], [place] [address]  Troponin I (High Sensitivity)     Status: Abnormal   Collection Time: 11/15/22 12:39 AM  Result Value Ref Range   Troponin I (High Sensitivity) 5,707 (HH) <18 ng/L    Comment: CRITICAL VALUE NOTED. VALUE IS CONSISTENT WITH PREVIOUSLY REPORTED/CALLED VALUE (NOTE) Elevated high sensitivity troponin I (hsTnI) values and significant  changes across serial measurements may suggest ACS but many other  chronic and acute conditions are known to elevate hsTnI results.  Refer to the "Links" section for chest pain algorithms and additional  guidance. Performed at [facility], [address] [place], [place] [address]   Lipid panel     Status: Abnormal   Collection Time: 11/15/22 12:39 AM  Result Value Ref Range   Cholesterol 229 (H) 0 - 200 mg/dL   Triglycerides 440 (H) <150 mg/dL   HDL 51 >34 mg/dL   Total CHOL/HDL Ratio 4.5 RATIO   VLDL 61 (H) 0 - 40 mg/dL   LDL Cholesterol 742 (H)  0 - 99 mg/dL    Comment:        Total Cholesterol/HDL:CHD Risk Coronary Heart Disease Risk Table                     Men   Women  1/2 Average Risk   3.4   3.3  Average Risk       5.0   4.4  2 X Average Risk   9.6   7.1  3 X Average Risk  23.4   11.0        Use the calculated Patient Ratio above and the CHD Risk Table to determine the patient's CHD Risk.        ATP III CLASSIFICATION (LDL):  <100     mg/dL   Optimal  638-756  mg/dL   Near or Above                    Optimal  130-159  mg/dL   Borderline  433-295  mg/dL   High  >188     mg/dL   Very High Performed at [facility], [address], [place], [place] [address]    Heparin level (unfractionated)     Status: None   Collection Time: 11/15/22  5:25 AM  Result Value Ref Range   Heparin Unfractionated 0.48 0.30 - 0.70 IU/mL    Comment: (NOTE) The clinical reportable range upper limit is being lowered to >1.10 to align with the FDA approved guidance for the current laboratory assay.  If heparin results are below expected values, and patient dosage has  been confirmed, suggest follow up testing of antithrombin III levels. Performed at [facility], [address] [place], [place] [address]    ECHOCARDIOGRAM COMPLETE  Result Date: 11/15/2022    ECHOCARDIOGRAM REPORT   Patient Name:   Ricky HEFFERN Sr. Date of Exam: 11/15/2022 Medical Rec #:  [id number]             Height:       75.0 in Accession #:    [id number]            Weight:       210.0 lb Date of Birth:  13-Jul-1945             BSA:          2.240 m Patient Age:    43 years              BP:           102/85 mmHg Patient Gender: M                     HR:           71 bpm. Exam Location:  Inpatient Procedure: 2D Echo, Cardiac Doppler, Color Doppler and Strain Analysis Indications:    NSTEMI  History:        Patient has prior history of Echocardiogram examinations, most                 recent 11/06/2017. Cancer; Risk Factors:Hypertension,                 Dyslipidemia and Diabetes.  Sonographer:    Delcie Roch RDCS Referring Phys: [id number] Elmon Kirschner  Sonographer Comments: Global longitudinal strain was attempted. IMPRESSIONS  1. Septal, apical distal anterior, mid/distal inferior wall hypokinesis . Left ventricular ejection fraction, by estimation, is 40 to 45%. The left  ventricle has mildly decreased function. The left ventricle demonstrates regional wall motion abnormalities (see scoring diagram/findings for description). The left ventricular internal cavity size was mildly dilated. There is mild left ventricular hypertrophy. Left ventricular diastolic parameters are consistent with Grade I  diastolic dysfunction (impaired relaxation).  2. Right ventricular systolic function is normal. The right ventricular size is normal. There is moderately elevated pulmonary artery systolic pressure.  3. Left atrial size was mildly dilated.  4. The mitral valve is normal in structure. Mild mitral valve regurgitation. No evidence of mitral stenosis.  5. The aortic valve is tricuspid. There is mild calcification of the aortic valve. Aortic valve regurgitation is mild. Aortic valve sclerosis is present, with no evidence of aortic valve stenosis.  6. The inferior vena cava is normal in size with greater than 50% respiratory variability, suggesting right atrial pressure of 3 mmHg. FINDINGS  Left Ventricle: Septal, apical distal anterior, mid/distal inferior wall hypokinesis. Left ventricular ejection fraction, by estimation, is 40 to 45%. The left ventricle has mildly decreased function. The left ventricle demonstrates regional wall motion  abnormalities. The left ventricular internal cavity size was mildly dilated. There is mild left ventricular hypertrophy. Left ventricular diastolic parameters are consistent with Grade I diastolic dysfunction (impaired relaxation). Right Ventricle: The right ventricular size is normal. No increase in right ventricular wall thickness. Right ventricular systolic function is normal. There is moderately elevated pulmonary artery systolic pressure. The tricuspid regurgitant velocity is 3.73 m/s, and with an assumed right atrial pressure of 3 mmHg, the estimated right ventricular systolic pressure is 58.7 mmHg. Left Atrium: Left atrial size was mildly dilated. Right Atrium: Right atrial size was normal in size. Pericardium: There is no evidence of pericardial effusion. Mitral Valve: The mitral valve is normal in structure. Mild mitral valve regurgitation. No evidence of mitral valve stenosis. Tricuspid Valve: The tricuspid valve is normal in structure. Tricuspid valve regurgitation is mild .  No evidence of tricuspid stenosis. Aortic Valve: The aortic valve is tricuspid. There is mild calcification of the aortic valve. Aortic valve regurgitation is mild. Aortic valve sclerosis is present, with no evidence of aortic valve stenosis. Pulmonic Valve: The pulmonic valve was normal in structure. Pulmonic valve regurgitation is trivial. No evidence of pulmonic stenosis. Aorta: The aortic root is normal in size and structure. Venous: The inferior vena cava is normal in size with greater than 50% respiratory variability, suggesting right atrial pressure of 3 mmHg. IAS/Shunts: No atrial level shunt detected by color flow Doppler.  LEFT VENTRICLE PLAX 2D LVIDd:         5.40 cm   Diastology LVIDs:         3.40 cm   LV e' medial:    5.22 cm/s LV PW:         1.10 cm   LV E/e' medial:  16.0 LV IVS:        1.30 cm   LV e' lateral:   5.22 cm/s LVOT diam:     2.00 cm   LV E/e' lateral: 16.0 LV SV:         76 LV SV Index:   34 LVOT Area:     3.14 cm  RIGHT VENTRICLE             IVC RV Basal diam:  2.80 cm     IVC diam: 1.90 cm RV S prime:     11.70 cm/s TAPSE (M-mode): 1.9 cm LEFT ATRIUM  Index        RIGHT ATRIUM           Index LA diam:        3.90 cm 1.74 cm/m   RA Area:     14.00 cm LA Vol (A2C):   74.2 ml 33.12 ml/m  RA Volume:   33.50 ml  14.95 ml/m LA Vol (A4C):   68.6 ml 30.62 ml/m LA Biplane Vol: 73.8 ml 32.94 ml/m  AORTIC VALVE LVOT Vmax:   104.00 cm/s LVOT Vmean:  70.100 cm/s LVOT VTI:    0.242 m  AORTA Ao Root diam: 3.40 cm Ao Asc diam:  3.50 cm MITRAL VALVE               TRICUSPID VALVE MV Area (PHT): 2.54 cm    TR Peak grad:   55.7 mmHg MV Decel Time: 299 msec    TR Vmax:        373.00 cm/s MV E velocity: 83.60 cm/s MV A velocity: 82.80 cm/s  SHUNTS MV E/A ratio:  1.01        Systemic VTI:  0.24 m                            Systemic Diam: 2.00 cm Charlton Haws MD Electronically signed by Charlton Haws MD Signature Date/Time: 11/15/2022/10:27:56 AM    Final    DG Chest 2 View  Result Date:  11/14/2022 CLINICAL DATA:  Chest pain, shortness of breath EXAM: CHEST - 2 VIEW COMPARISON:  Chest radiograph 12/14/2020 FINDINGS: The heart is borderline enlarged. The upper mediastinal contours are normal, allowing for rightward patient rotation There are patchy opacities in the lung bases on the lateral projection. There is no other focal airspace opacity. There is no pulmonary edema. There is no pleural effusion or pneumothorax. There is no acute osseous abnormality. IMPRESSION: Patchy opacities in the lung bases on the lateral projection could reflect atelectasis or infection in the correct clinical setting. Electronically Signed   By: Lesia Hausen M.D.   On: 11/14/2022 19:47    Pending Labs Unresulted Labs (From admission, onward)     Start     Ordered   11/16/22 0500  Heparin level (unfractionated)  Daily,   R      11/14/22 2052   11/15/22 1330  Heparin level (unfractionated)  Once-Timed,   TIMED        11/15/22 0642   11/15/22 0630  Basic metabolic panel  [facility] Morning draw,   R      11/14/22 2130   11/15/22 0630  CBC  [facility] Morning draw,   R      11/14/22 2130   11/15/22 0500  Lipoprotein A (LPA)  Tomorrow morning,   R        11/14/22 2130            Vitals/Pain Today's Vitals   11/15/22 0957 11/15/22 1000 11/15/22 1106 11/15/22 1110  BP:  132/81  122/79  Pulse:  69  66  Resp:  13  16  Temp:   98.1 F (36.7 C)   TempSrc:   Oral   SpO2:  100%  100%  Weight:      Height:      PainSc: 0-No pain       Isolation Precautions No active isolations  Medications Medications  heparin ADULT infusion 100 units/mL (25000 units/228mL) (1,250 Units/hr Intravenous New Bag/Given 11/14/22 2046)  relugolix (ORGOVYX) tablet  120 mg (0 mg Oral Hold 11/14/22 2247)  enzalutamide (XTANDI) capsule 160 mg (0 mg Oral Hold 11/14/22 2246)  aspirin EC tablet 81 mg (81 mg Oral Given 11/15/22 0956)  nitroGLYCERIN (NITROSTAT) SL tablet 0.4 mg (0.4 mg Sublingual Given 11/15/22 0045)  acetaminophen (TYLENOL)  tablet 650 mg (has no administration in time range)  ondansetron (ZOFRAN) injection 4 mg (has no administration in time range)  allopurinol (ZYLOPRIM) tablet 300 mg (300 mg Oral Given 11/15/22 0951)  irbesartan (AVAPRO) tablet 300 mg (300 mg Oral Given 11/15/22 0955)  pantoprazole (PROTONIX) EC tablet 40 mg (40 mg Oral Given 11/15/22 0956)  aspirin chewable tablet 81 mg (has no administration in time range)  0.9 %  sodium chloride infusion (has no administration in time range)  metoprolol tartrate (LOPRESSOR) tablet 50 mg (has no administration in time range)  nitroGLYCERIN (NITROSTAT) SL tablet 0.4 mg (0.4 mg Sublingual Given 11/14/22 1915)  fentaNYL (SUBLIMAZE) injection 50 mcg (50 mcg Intravenous Given 11/14/22 1915)  fentaNYL (SUBLIMAZE) injection 50 mcg (50 mcg Intravenous Given 11/14/22 1958)  heparin bolus via infusion 4,000 Units (4,000 Units Intravenous Bolus from Bag 11/14/22 2047)    Mobility walks     Focused Assessments Cardiac Assessment Handoff:  Cardiac Rhythm: Normal sinus rhythm No results found for: "CKTOTAL", "CKMB", "CKMBINDEX", "TROPONINI" No results found for: "DDIMER" Does the Patient currently have chest pain? No    R Recommendations: See Admitting Provider Note  Report given to:   Additional Notes:

## 2022-11-15 NOTE — ED Notes (Signed)
Pt states he has a family member bringing the meds that are not provided by the hosp

## 2022-11-15 NOTE — Progress Notes (Addendum)
Cardiologist:  Shatonia Hoots/Klein  Subjective:  Denies SSCP, palpitations or Dyspnea   Objective:  Vitals:   11/15/22 0711 11/15/22 0800 11/15/22 0905 11/15/22 1000  BP:  102/85 (!) 133/97 132/81  Pulse:  73 70 69  Resp:  (!) 24 19 13   Temp: 97.8 F (36.6 C)     TempSrc: Oral     SpO2:  100% 100% 100%  Weight:      Height:        Intake/Output from previous day: No intake or output data in the 24 hours ending 11/15/22 1007  Physical Exam: Affect appropriate Healthy:  appears stated age HEENT: normal Neck supple with no adenopathy JVP normal right  bruits no thyromegaly Lungs clear with no wheezing and good diaphragmatic motion Heart:  S1/S2 no murmur, no rub, gallop or click PMI normal Abdomen: benighn, BS positve, no tenderness, no AAA no bruit.  No HSM or HJR Distal pulses intact with no bruits No edema Neuro non-focal Skin warm and dry No muscular weakness   Lab Results: Basic Metabolic Panel: Recent Labs    11/14/22 1821 11/15/22 0039  NA 137 136  K 4.0 3.8  CL 102 102  CO2 21* 23  GLUCOSE 142* 155*  BUN 39* 39*  CREATININE 2.05* 1.95*  CALCIUM 9.5 9.4   Liver Function Tests: Recent Labs    11/14/22 1821  AST 26  ALT 13  ALKPHOS 66  BILITOT 1.0  PROT 7.3  ALBUMIN 3.6   No results for input(s): "LIPASE", "AMYLASE" in the last 72 hours. CBC: Recent Labs    11/14/22 1821 11/15/22 0039  WBC 7.0 6.0  HGB 10.1* 9.5*  HCT 30.0* 28.1*  MCV 99.0 98.3  PLT 158 151    Fasting Lipid Panel: Recent Labs    11/15/22 0039  CHOL 229*  HDL 51  LDLCALC 117*  TRIG 307*  CHOLHDL 4.5     Imaging: DG Chest 2 View  Result Date: 11/14/2022 CLINICAL DATA:  Chest pain, shortness of breath EXAM: CHEST - 2 VIEW COMPARISON:  Chest radiograph 12/14/2020 FINDINGS: The heart is borderline enlarged. The upper mediastinal contours are normal, allowing for rightward patient rotation There are patchy opacities in the lung bases on the lateral projection.  There is no other focal airspace opacity. There is no pulmonary edema. There is no pleural effusion or pneumothorax. There is no acute osseous abnormality. IMPRESSION: Patchy opacities in the lung bases on the lateral projection could reflect atelectasis or infection in the correct clinical setting. Electronically Signed   By: Lesia Hausen M.D.   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 and decreased EF TTE pending Plan left heart cath with no LV Monday Sooner if recurrent pain Troponin peak 5707 Discussed cath with son  and patient Good right radial pulse  CRF acute rise 2.05 this am 1.95 will see what EF is CXR atelectasis May need hydration Sunday night   Shared Decision Making/Informed Consent The risks [stroke (1 in 1000), death (1 in 1000), kidney failure [usually temporary] (1 in 500), bleeding (1 in 200), allergic reaction [possibly serious] (1 in 200)], benefits (diagnostic support and management of coronary artery disease) and alternatives of a cardiac catheterization were discussed in detail with [name] and she is willing to proceed.  2. Oncology:  MGUs and prostate cancer S/P radioRx. Increase in free kappa light chain Bone mets Rx with Orgovyx and Xtandi   3. HTN:  see changes above d/c norvasc increase beta blocker continue ARB  4. HLD:  LDL 117 intolerant to statin/zetia in past will need PSK9 Rx   Charlton Haws 11/15/2022, 10:07 AM

## 2022-11-15 NOTE — ED Notes (Signed)
Patient transported to vascular. 

## 2022-11-15 NOTE — Progress Notes (Signed)
ANTICOAGULATION CONSULT NOTE  Pharmacy Consult for heparin Indication: chest pain/ACS  Allergies  Allergen Reactions   Ezetimibe Other (See Comments)    Muscle aches   Statins Other (See Comments)    Joint pain    Patient Measurements: Height: 6\' 3"  (190.5 cm) Weight: 95.4 kg (210 lb 5.1 oz) IBW/kg (Calculated) : 84.5 Heparin Dosing Weight: 95.3 kg  Vital Signs: Temp: 98.8 F (37.1 C) (09/07 1345) Temp Source: Oral (09/07 1345) BP: 136/85 (09/07 1345) Pulse Rate: 62 (09/07 1345)  Labs: Recent Labs    11/14/22 1821 11/14/22 2032 11/14/22 2035 11/15/22 0039 11/15/22 0525 11/15/22 1442  HGB 10.1*  --   --  9.5*  --   --   HCT 30.0*  --   --  28.1*  --   --   PLT 158  --   --  151  --   --   APTT  --  30  --   --   --   --   LABPROT  --  13.6  --   --   --   --   INR  --  1.0  --   --   --   --   HEPARINUNFRC  --  <0.10*  --   --  0.48 0.41  CREATININE 2.05*  --   --  1.95*  --   --   TROPONINIHS 144*  --  2,132* 5,707*  --   --     Estimated Creatinine Clearance: 36.7 mL/min (A) (by C-G formula based on SCr of 1.95 mg/dL (H)).   Medical History: Past Medical History:  Diagnosis Date   Borderline diabetes    DIET CONTROLLED   Dyslipidemia    Elevated PSA    External carotid artery stenosis    MILD-- BILATERAL   Fall 11/05/2017   GERD (gastroesophageal reflux disease)    Gout, arthritis    Hearing loss in left ear    Heart murmur    MILD --  ASYMPTOMATIC   History of gastric ulcer    REMOTE HX   Hypertension    Pre-diabetes    Prostate cancer (HCC) 12/21/2012   Gleason 3+4=7   Seizure-like activity (HCC)    Syncope and collapse    Wears glasses     Assessment: Ricky Rowland. is a 77 y.o. male who presents with chest pain, diaphoretic and N/V found to have NSTEMI. No anticoagulation prior to admission. Pharmacy consulted for heparin dosing.   9/7 PM: Confirmatory heparin level 0.41, therapeutic on 1250 units/hr. No issues with infusion or  signs of bleeding noted by RN. Hgb slowly down trending to 9.5, Plt stable low at 151 today.   Goal of Therapy:  Heparin level 0.3-0.7 units/ml Monitor platelets by anticoagulation protocol: Yes   Plan:  Continue heparin infusion at 1250 units/hr Daily heparin level, CBC, and monitoring for bleeding F/u plans for anticoagulation and cath   Thank you for allowing pharmacy to participate in this patient's care.  Enos Fling, PharmD PGY-1 Acute Care Pharmacy Resident 11/15/2022 3:51 PM

## 2022-11-15 NOTE — ED Notes (Signed)
[phone] TJ Jr. Son if needed.

## 2022-11-15 NOTE — ED Notes (Signed)
Pt is NSR w/PACs on monitor

## 2022-11-16 ENCOUNTER — Inpatient Hospital Stay (HOSPITAL_COMMUNITY)
Admission: EM | Disposition: A | Discharge status: Home / Self Care | Payer: Self-pay | Source: Home / Self Care | Attending: Cardiovascular Disease

## 2022-11-16 DIAGNOSIS — R001 Bradycardia, unspecified: Secondary | ICD-10-CM | POA: Diagnosis not present

## 2022-11-16 DIAGNOSIS — I214 Non-ST elevation (NSTEMI) myocardial infarction: Secondary | ICD-10-CM | POA: Diagnosis not present

## 2022-11-16 HISTORY — PX: CORONARY ULTRASOUND/IVUS: CATH118244

## 2022-11-16 HISTORY — PX: TEMPORARY PACEMAKER: CATH118268

## 2022-11-16 HISTORY — PX: CORONARY STENT INTERVENTION: CATH118234

## 2022-11-16 HISTORY — PX: LEFT HEART CATH AND CORONARY ANGIOGRAPHY: CATH118249

## 2022-11-16 LAB — CBC
HCT: 27.7 % — ABNORMAL LOW (ref 39.0–52.0)
Hemoglobin: 9.1 g/dL — ABNORMAL LOW (ref 13.0–17.0)
MCH: 32.2 pg (ref 26.0–34.0)
MCHC: 32.9 g/dL (ref 30.0–36.0)
MCV: 97.9 fL (ref 80.0–100.0)
Platelets: 145 10*3/uL — ABNORMAL LOW (ref 150–400)
RBC: 2.83 MIL/uL — ABNORMAL LOW (ref 4.22–5.81)
RDW: 13.2 % (ref 11.5–15.5)
WBC: 6.3 10*3/uL (ref 4.0–10.5)
nRBC: 0 % (ref 0.0–0.2)

## 2022-11-16 LAB — POCT ACTIVATED CLOTTING TIME
Activated Clotting Time: 256 s
Activated Clotting Time: 354 s
Activated Clotting Time: >1000 s
Activated Clotting Time: 201 s

## 2022-11-16 LAB — BASIC METABOLIC PANEL
Anion gap: 11 (ref 5–15)
BUN: 39 mg/dL — ABNORMAL HIGH (ref 8–23)
CO2: 23 mmol/L (ref 22–32)
Calcium: 9.3 mg/dL (ref 8.9–10.3)
Chloride: 102 mmol/L (ref 98–111)
Creatinine, Ser: 1.72 mg/dL — ABNORMAL HIGH (ref 0.61–1.24)
GFR, Estimated: 40 mL/min — ABNORMAL LOW (ref >60)
Glucose, Bld: 131 mg/dL — ABNORMAL HIGH (ref 70–99)
Potassium: 4 mmol/L (ref 3.5–5.1)
Sodium: 136 mmol/L (ref 135–145)

## 2022-11-16 LAB — HEPARIN LEVEL (UNFRACTIONATED): Heparin Unfractionated: 0.47 [IU]/mL (ref 0.30–0.70)

## 2022-11-16 LAB — MRSA NEXT GEN BY PCR, NASAL: MRSA by PCR Next Gen: NOT DETECTED

## 2022-11-16 SURGERY — CORONARY STENT INTERVENTION
Anesthesia: LOCAL

## 2022-11-16 MED ORDER — TICAGRELOR 90 MG PO TABS
90.0000 mg | ORAL_TABLET | Freq: Two times a day (BID) | ORAL | Status: DC
  Administered 2022-11-16 – 2022-11-18 (×5): 90 mg via ORAL
  Filled 2022-11-16 (×5): qty 1

## 2022-11-16 MED ORDER — FENTANYL CITRATE (PF) 100 MCG/2ML IJ SOLN
INTRAMUSCULAR | Status: DC | PRN
  Administered 2022-11-16: 12.5 ug via INTRAVENOUS
  Administered 2022-11-16 (×2): 25 ug via INTRAVENOUS
  Administered 2022-11-16: 12.5 ug via INTRAVENOUS

## 2022-11-16 MED ORDER — MIDAZOLAM HCL 2 MG/2ML IJ SOLN
INTRAMUSCULAR | Status: AC
  Filled 2022-11-16: qty 2

## 2022-11-16 MED ORDER — VERAPAMIL HCL 2.5 MG/ML IV SOLN
INTRAVENOUS | Status: AC
  Filled 2022-11-16: qty 2

## 2022-11-16 MED ORDER — SODIUM CHLORIDE 0.9% FLUSH: 3.0000 mL | INTRAVENOUS | Status: DC | PRN

## 2022-11-16 MED ORDER — SODIUM CHLORIDE 0.9 % IV SOLN: INTRAVENOUS | Status: AC

## 2022-11-16 MED ORDER — LIDOCAINE HCL (PF) 1 % IJ SOLN
INTRAMUSCULAR | Status: DC | PRN
  Administered 2022-11-16: 5 mL via INTRADERMAL
  Administered 2022-11-16: 15 mL via INTRADERMAL

## 2022-11-16 MED ORDER — FENTANYL CITRATE (PF) 100 MCG/2ML IJ SOLN
INTRAMUSCULAR | Status: AC
  Filled 2022-11-16: qty 2

## 2022-11-16 MED ORDER — NITROGLYCERIN IN D5W 200-5 MCG/ML-% IV SOLN
2.0000 ug/min | INTRAVENOUS | Status: DC
  Administered 2022-11-16: 5 ug/min via INTRAVENOUS
  Administered 2022-11-17: 40 ug/min via INTRAVENOUS
  Filled 2022-11-16 (×2): qty 250

## 2022-11-16 MED ORDER — ACETAMINOPHEN 325 MG PO TABS: 650.0000 mg | ORAL_TABLET | ORAL | Status: DC | PRN

## 2022-11-16 MED ORDER — MIDAZOLAM HCL 2 MG/2ML IJ SOLN
INTRAMUSCULAR | Status: DC | PRN
  Administered 2022-11-16: 1 mg via INTRAVENOUS

## 2022-11-16 MED ORDER — NITROGLYCERIN 1 MG/10 ML FOR IR/CATH LAB
INTRA_ARTERIAL | Status: AC
  Filled 2022-11-16: qty 10

## 2022-11-16 MED ORDER — TICAGRELOR 90 MG PO TABS
ORAL_TABLET | ORAL | Status: DC | PRN
  Administered 2022-11-16: 180 mg via ORAL

## 2022-11-16 MED ORDER — HEPARIN SODIUM (PORCINE) 1000 UNIT/ML IJ SOLN
INTRAMUSCULAR | Status: DC | PRN
  Administered 2022-11-16: 3000 [IU] via INTRAVENOUS
  Administered 2022-11-16: 9000 [IU] via INTRAVENOUS
  Administered 2022-11-16: 2000 [IU] via INTRAVENOUS

## 2022-11-16 MED ORDER — HEPARIN SODIUM (PORCINE) 1000 UNIT/ML IJ SOLN
INTRAMUSCULAR | Status: AC
  Filled 2022-11-16: qty 10

## 2022-11-16 MED ORDER — SODIUM CHLORIDE 0.9 % IV SOLN: INTRAVENOUS | Status: DC

## 2022-11-16 MED ORDER — CHLORHEXIDINE GLUCONATE CLOTH 2 % EX PADS
6.0000 | MEDICATED_PAD | Freq: Every day | CUTANEOUS | Status: DC
  Administered 2022-11-16 – 2022-11-18 (×3): 6 via TOPICAL

## 2022-11-16 MED ORDER — MELATONIN 3 MG PO TABS
3.0000 mg | ORAL_TABLET | Freq: Every evening | ORAL | Status: DC | PRN
  Administered 2022-11-17 – 2022-11-18 (×2): 3 mg via ORAL
  Filled 2022-11-16 (×3): qty 1

## 2022-11-16 MED ORDER — IOHEXOL 350 MG/ML SOLN
INTRAVENOUS | Status: DC | PRN
  Administered 2022-11-16: 137 mL

## 2022-11-16 MED ORDER — HYDRALAZINE HCL 20 MG/ML IJ SOLN: 10.0000 mg | INTRAMUSCULAR | Status: AC | PRN

## 2022-11-16 MED ORDER — ONDANSETRON HCL 4 MG/2ML IJ SOLN: 4.0000 mg | Freq: Four times a day (QID) | INTRAMUSCULAR | Status: DC | PRN

## 2022-11-16 MED ORDER — LIDOCAINE HCL (PF) 1 % IJ SOLN
INTRAMUSCULAR | Status: AC
  Filled 2022-11-16: qty 30

## 2022-11-16 MED ORDER — HEPARIN (PORCINE) IN NACL 1000-0.9 UT/500ML-% IV SOLN
INTRAVENOUS | Status: DC | PRN
  Administered 2022-11-16 (×3): 500 mL

## 2022-11-16 MED ORDER — SODIUM CHLORIDE 0.9% FLUSH
3.0000 mL | Freq: Two times a day (BID) | INTRAVENOUS | Status: DC
  Administered 2022-11-16 – 2022-11-18 (×5): 3 mL via INTRAVENOUS

## 2022-11-16 MED ORDER — ORAL CARE MOUTH RINSE: 15.0000 mL | OROMUCOSAL | Status: DC | PRN

## 2022-11-16 MED ORDER — ASPIRIN 81 MG PO CHEW: 81.0000 mg | CHEWABLE_TABLET | ORAL | Status: DC

## 2022-11-16 MED ORDER — SODIUM CHLORIDE 0.9 % IV SOLN: 250.0000 mL | INTRAVENOUS | Status: DC | PRN

## 2022-11-16 MED ORDER — LABETALOL HCL 5 MG/ML IV SOLN: 10.0000 mg | INTRAVENOUS | Status: AC | PRN

## 2022-11-16 SURGICAL SUPPLY — 37 items
BALLN EMERGE MR 2.5X15 (BALLOONS) ×1
BALLN EMERGE MR 3.5X15 (BALLOONS) ×1
BALLN EMERGE MR 3.5X8 (BALLOONS) ×1
BALLN SCOREFLEX 3.50X10 (BALLOONS) ×1
BALLN ~~LOC~~ EMERGE MR 3.75X15 (BALLOONS) ×1
BALLN ~~LOC~~ EMERGE MR 4.0X12 (BALLOONS) ×1
BALLOON EMERGE MR 2.5X15 (BALLOONS) IMPLANT
BALLOON EMERGE MR 3.5X15 (BALLOONS) IMPLANT
BALLOON EMERGE MR 3.5X8 (BALLOONS) IMPLANT
BALLOON SCOREFLEX 3.50X10 (BALLOONS) IMPLANT
BALLOON ~~LOC~~ EMERGE MR 3.75X15 (BALLOONS) IMPLANT
BALLOON ~~LOC~~ EMERGE MR 4.0X12 (BALLOONS) IMPLANT
CABLE ADAPT PACING TEMP 12FT (ADAPTER) IMPLANT
CATH GUIDELINER COAST (CATHETERS) IMPLANT
CATH INFINITI AMBI 5FR TG (CATHETERS) IMPLANT
CATH LAUNCHER 6FR EBU3.5 (CATHETERS) IMPLANT
CATH OPTICROSS HD (CATHETERS) IMPLANT
CATH S G BIP PACING (CATHETERS) IMPLANT
CATH-GARD ARROW CATH SHIELD (MISCELLANEOUS) ×1
DEVICE RAD COMP TR BAND LRG (VASCULAR PRODUCTS) IMPLANT
ELECT DEFIB PAD ADLT CADENCE (PAD) IMPLANT
GLIDESHEATH SLEND A-KIT 6F 22G (SHEATH) IMPLANT
GUIDEWIRE INQWIRE 1.5J.035X260 (WIRE) IMPLANT
INQWIRE 1.5J .035X260CM (WIRE) ×1
KIT ENCORE 26 ADVANTAGE (KITS) IMPLANT
KIT HEMO VALVE WATCHDOG (MISCELLANEOUS) IMPLANT
KIT MICROPUNCTURE NIT STIFF (SHEATH) IMPLANT
KIT SYRINGE INJ CVI SPIKEX1 (MISCELLANEOUS) IMPLANT
PACK CARDIAC CATHETERIZATION (CUSTOM PROCEDURE TRAY) ×2 IMPLANT
SET ATX-X65L (MISCELLANEOUS) IMPLANT
SHEATH PINNACLE 6F 10CM (SHEATH) IMPLANT
SHEATH PROBE COVER 6X72 (BAG) IMPLANT
SHIELD CATHGARD ARROW (MISCELLANEOUS) IMPLANT
SLED PULL BACK IVUS (MISCELLANEOUS) IMPLANT
STENT SYNERGY XD 3.50X20 (Permanent Stent) IMPLANT
SYNERGY XD 3.50X20 (Permanent Stent) ×1 IMPLANT
WIRE ASAHI PROWATER 180CM (WIRE) IMPLANT

## 2022-11-16 NOTE — Progress Notes (Signed)
   11/16/22 1300  Spiritual Encounters  Type of Visit Initial  Care provided to: Pt and family  Conversation partners present during encounter Nurse  Referral source Code page  Reason for visit Surgical  OnCall Visit Yes  Spiritual Framework  Presenting Themes Meaning/purpose/sources of inspiration;Significant life change;Coping tools  Community/Connection Family;Friend(s)  Patient Stress Factors Health changes  Family Stress Factors Health changes  Interventions  Spiritual Care Interventions Made Established relationship of care and support;Compassionate presence;Reflective listening;Normalization of emotions;Prayer  Intervention Outcomes  Outcomes Connection to spiritual care;Awareness around self/spiritual resourses;Connection to values and goals of care;Awareness of support  Spiritual Care Plan  Spiritual Care Issues Still Outstanding Chaplain will continue to follow   The Chaplain responded to page. The patient was about to get catheter surgery. His two daughter, granddaughter and neighbor was in the room. He was very nervous for the procedure. He had been bone cancer survivor before, then he has prostate cancer. Now, he will have catheter surgery. Therefore, he feels overtired for all of the happenings. The chaplain was present, offered prayer, and normalized the emotions. The chaplain will follow up.  M.Kubra Dariela Stoker, MA Chaplain Intern [phone]

## 2022-11-16 NOTE — Progress Notes (Signed)
Received call at 1239 from CCMD that pt's HR was 38. Pt was sitting in chair when RN entered room. BP 93/54, asymptomatic. HR sustaining 36 and increased to 60s with activity. MD Nishan paged, EKG done. Nishan came bedside and pt prepped for cath lab. Pt taken down to cath lab at 1400.

## 2022-11-16 NOTE — Progress Notes (Signed)
Called to see patient for acute onset bradycardia Did receive 50 mg of lopressor at 8:38 am  He has not had more chest pain.  ECG with junctional rhythm and telemetry with SB/junctional no complete heart block Given SEMI with troponin over 5,000 New RWMA on TTE with EF 40-45%  Will arrange acute cath today  Shared Decision Making/Informed Consent The risks [stroke (1 in 1000), death (1 in 1000), kidney failure [usually temporary] (1 in 500), bleeding (1 in 200), allergic reaction [possibly serious] (1 in 200)], benefits (diagnostic support and management of coronary artery disease) and alternatives of a cardiac catheterization were discussed in detail with Ms. Wanita Chamberlain and she is willing to proceed.  Bradycardia may be ischemic related and ECG shows much more prolonged  QT with deep T wave inversions across precordium   See no reason to wait for cath. Dr Joaquim Nam called and will activate team He is on heparin. Ate breakfast but not lunch  Stable with systolic BP 105 mmHg   Will prep right radial and right groin.  Also discussed possible need for TMP wire Nurse will have him sign consent for both procedures   He will likely need to be transferred to CCU after procedure   Wife and niece in room with patient and all questions/risks discussed  They are anxious to proceed today  Time critical care 30 minutes  Charlton Haws MD [facility]

## 2022-11-16 NOTE — Progress Notes (Signed)
ANTICOAGULATION CONSULT NOTE  Pharmacy Consult for heparin Indication: chest pain/ACS  Allergies  Allergen Reactions   Ezetimibe Other (See Comments)    Muscle aches   Statins Other (See Comments)    Joint pain    Patient Measurements: Height: 6\' 3"  (190.5 cm) Weight: 95.4 kg (210 lb 5.1 oz) IBW/kg (Calculated) : 84.5 Heparin Dosing Weight: 95.3 kg  Vital Signs: Temp: 98.4 F (36.9 C) (09/08 0434) Temp Source: Oral (09/08 0434) BP: 128/69 (09/08 0434) Pulse Rate: 53 (09/08 0434)  Labs: Recent Labs    11/14/22 1821 11/14/22 1821 11/14/22 2032 11/14/22 2035 11/15/22 0039 11/15/22 0525 11/15/22 1442 11/16/22 0432  HGB 10.1*  --   --   --  9.5*  --   --  9.1*  HCT 30.0*  --   --   --  28.1*  --   --  27.7*  PLT 158  --   --   --  151  --   --  145*  APTT  --   --  30  --   --   --   --   --   LABPROT  --   --  13.6  --   --   --   --   --   INR  --   --  1.0  --   --   --   --   --   HEPARINUNFRC  --    < > <0.10*  --   --  0.48 0.41 0.47  CREATININE 2.05*  --   --   --  1.95*  --   --  1.72*  TROPONINIHS 144*  --   --  2,132* 5,707*  --   --   --    < > = values in this interval not displayed.    Estimated Creatinine Clearance: 41.6 mL/min (A) (by C-G formula based on SCr of 1.72 mg/dL (H)).   Medical History: Past Medical History:  Diagnosis Date   Borderline diabetes    DIET CONTROLLED   Dyslipidemia    Elevated PSA    External carotid artery stenosis    MILD-- BILATERAL   Fall 11/05/2017   GERD (gastroesophageal reflux disease)    Gout, arthritis    Hearing loss in left ear    Heart murmur    MILD --  ASYMPTOMATIC   History of gastric ulcer    REMOTE HX   Hypertension    Pre-diabetes    Prostate cancer (HCC) 12/21/2012   Gleason 3+4=7   Seizure-like activity (HCC)    Syncope and collapse    Wears glasses     Assessment: Ricky Rowland. is a 77 y.o. male who presents with chest pain, diaphoretic and N/V found to have NSTEMI. No  anticoagulation prior to admission. Pharmacy consulted for heparin dosing.   9/8 AM: Heparin level 0.47, therapeutic on 1250 units/hr. No issues with infusion or signs of bleeding noted by RN. Hgb slowly down trending to 9.1, Plt stable low at 145.   Goal of Therapy:  Heparin level 0.3-0.7 units/ml Monitor platelets by anticoagulation protocol: Yes   Plan:  Continue heparin infusion at 1250 units/hr Daily heparin level, CBC, and monitoring for bleeding F/u plans for anticoagulation and cath   Thank you for allowing pharmacy to participate in this patient's care.  Enos Fling, PharmD PGY-1 Acute Care Pharmacy Resident 11/16/2022 7:38 AM

## 2022-11-16 NOTE — Progress Notes (Signed)
Mobility Specialist Progress Note    11/16/22 1217  Mobility  Activity Ambulated independently in hallway  Level of Assistance Standby assist, set-up cues, supervision of patient - no hands on  Assistive Device None  Distance Ambulated (ft) 220 ft  Activity Response Tolerated fair  Mobility Referral Yes  $Mobility charge 1 Mobility  Mobility Specialist Start Time (ACUTE ONLY) 1211  Mobility Specialist Stop Time (ACUTE ONLY) 1217  Mobility Specialist Time Calculation (min) (ACUTE ONLY) 6 min   Pre-Mobility: 55 HR During Mobility: 48 HR Post-Mobility: 59 HR  Pt received sitting EOB and agreeable. Took x1 short standing rest breaks c/o fatigue. Pt breathing heavier with exertion. Returned to sitting EOB with call bell in reach. RN notified.   [place] Mobility Specialist  Please Neurosurgeon or Rehab Office at [phone]

## 2022-11-16 NOTE — Progress Notes (Signed)
Cardiologist:  Ricky Rowland  Subjective:  Denies SSCP, palpitations or Dyspnea   Objective:  Vitals:   11/15/22 2344 11/16/22 0434 11/16/22 0818 11/16/22 0838  BP: 121/69 128/69 131/67   Pulse: 61 (!) 53 (!) 53 (!) 58  Resp: 16 17 17    Temp: 97.7 F (36.5 C) 98.4 F (36.9 C) 98.1 F (36.7 C)   TempSrc: Oral Oral Oral   SpO2: 96% 99% 99%   Weight:      Height:        Intake/Output from previous day:  Intake/Output Summary (Last 24 hours) at 11/16/2022 1478 Last data filed at 11/15/2022 2345 Gross per 24 hour  Intake --  Output 300 ml  Net -300 ml    Physical Exam: Affect appropriate Healthy:  appears stated age HEENT: normal Neck supple with no adenopathy JVP normal right  bruits no thyromegaly Lungs clear with no wheezing and good diaphragmatic motion Heart:  S1/S2 no murmur, no rub, gallop or click PMI normal Abdomen: benighn, BS positve, no tenderness, no AAA no bruit.  No HSM or HJR Distal pulses intact with no bruits No edema Neuro non-focal Skin warm and dry No muscular weakness   Lab Results: Basic Metabolic Panel: Recent Labs    11/15/22 0039 11/16/22 0432  NA 136 136  K 3.8 4.0  CL 102 102  CO2 23 23  GLUCOSE 155* 131*  BUN 39* 39*  CREATININE 1.95* 1.72*  CALCIUM 9.4 9.3   Liver Function Tests: Recent Labs    11/14/22 1821  AST 26  ALT 13  ALKPHOS 66  BILITOT 1.0  PROT 7.3  ALBUMIN 3.6   No results for input(s): "LIPASE", "AMYLASE" in the last 72 hours. CBC: Recent Labs    11/15/22 0039 11/16/22 0432  WBC 6.0 6.3  HGB 9.5* 9.1*  HCT 28.1* 27.7*  MCV 98.3 97.9  PLT 151 145*    Fasting Lipid Panel: Recent Labs    11/15/22 0039  CHOL 229*  HDL 51  LDLCALC 117*  TRIG 307*  CHOLHDL 4.5     Imaging: ECHOCARDIOGRAM COMPLETE  Result Date: 11/15/2022    ECHOCARDIOGRAM REPORT   Patient Name:   Ricky OMANS Sr. Date of Exam: 11/15/2022 Medical Rec #:  [id number]             Height:       75.0 in Accession #:     [id number]            Weight:       210.0 lb Date of Birth:  October 11, 1945             BSA:          2.240 m Patient Age:    77 years              BP:           102/85 mmHg Patient Gender: M                     HR:           71 bpm. Exam Location:  Inpatient Procedure: 2D Echo, Cardiac Doppler, Color Doppler and Strain Analysis Indications:    NSTEMI  History:        Patient has prior history of Echocardiogram examinations, most                 recent 11/06/2017. Cancer; Risk Factors:Hypertension,  Dyslipidemia and Diabetes.  Sonographer:    Delcie Roch RDCS Referring Phys: [id number] Ricky Rowland  Sonographer Comments: Global longitudinal strain was attempted. IMPRESSIONS  1. Septal, apical distal anterior, mid/distal inferior wall hypokinesis . Left ventricular ejection fraction, by estimation, is 40 to 45%. The left ventricle has mildly decreased function. The left ventricle demonstrates regional wall motion abnormalities (see scoring diagram/findings for description). The left ventricular internal cavity size was mildly dilated. There is mild left ventricular hypertrophy. Left ventricular diastolic parameters are consistent with Grade I diastolic dysfunction (impaired relaxation).  2. Right ventricular systolic function is normal. The right ventricular size is normal. There is moderately elevated pulmonary artery systolic pressure.  3. Left atrial size was mildly dilated.  4. The mitral valve is normal in structure. Mild mitral valve regurgitation. No evidence of mitral stenosis.  5. The aortic valve is tricuspid. There is mild calcification of the aortic valve. Aortic valve regurgitation is mild. Aortic valve sclerosis is present, with no evidence of aortic valve stenosis.  6. The inferior vena cava is normal in size with greater than 50% respiratory variability, suggesting right atrial pressure of 3 mmHg. FINDINGS  Left Ventricle: Septal, apical distal anterior, mid/distal inferior wall  hypokinesis. Left ventricular ejection fraction, by estimation, is 40 to 45%. The left ventricle has mildly decreased function. The left ventricle demonstrates regional wall motion  abnormalities. The left ventricular internal cavity size was mildly dilated. There is mild left ventricular hypertrophy. Left ventricular diastolic parameters are consistent with Grade I diastolic dysfunction (impaired relaxation). Right Ventricle: The right ventricular size is normal. No increase in right ventricular wall thickness. Right ventricular systolic function is normal. There is moderately elevated pulmonary artery systolic pressure. The tricuspid regurgitant velocity is 3.73 m/s, and with an assumed right atrial pressure of 3 mmHg, the estimated right ventricular systolic pressure is 58.7 mmHg. Left Atrium: Left atrial size was mildly dilated. Right Atrium: Right atrial size was normal in size. Pericardium: There is no evidence of pericardial effusion. Mitral Valve: The mitral valve is normal in structure. Mild mitral valve regurgitation. No evidence of mitral valve stenosis. Tricuspid Valve: The tricuspid valve is normal in structure. Tricuspid valve regurgitation is mild . No evidence of tricuspid stenosis. Aortic Valve: The aortic valve is tricuspid. There is mild calcification of the aortic valve. Aortic valve regurgitation is mild. Aortic valve sclerosis is present, with no evidence of aortic valve stenosis. Pulmonic Valve: The pulmonic valve was normal in structure. Pulmonic valve regurgitation is trivial. No evidence of pulmonic stenosis. Aorta: The aortic root is normal in size and structure. Venous: The inferior vena cava is normal in size with greater than 50% respiratory variability, suggesting right atrial pressure of 3 mmHg. IAS/Shunts: No atrial level shunt detected by color flow Doppler.  LEFT VENTRICLE PLAX 2D LVIDd:         5.40 cm   Diastology LVIDs:         3.40 cm   LV e' medial:    5.22 cm/s LV PW:          1.10 cm   LV E/e' medial:  16.0 LV IVS:        1.30 cm   LV e' lateral:   5.22 cm/s LVOT diam:     2.00 cm   LV E/e' lateral: 16.0 LV SV:         76 LV SV Index:   34 LVOT Area:     3.14 cm  RIGHT VENTRICLE  IVC RV Basal diam:  2.80 cm     IVC diam: 1.90 cm RV S prime:     11.70 cm/s TAPSE (M-mode): 1.9 cm LEFT ATRIUM             Index        RIGHT ATRIUM           Index LA diam:        3.90 cm 1.74 cm/m   RA Area:     14.00 cm LA Vol (A2C):   74.2 ml 33.12 ml/m  RA Volume:   33.50 ml  14.95 ml/m LA Vol (A4C):   68.6 ml 30.62 ml/m LA Biplane Vol: 73.8 ml 32.94 ml/m  AORTIC VALVE LVOT Vmax:   104.00 cm/s LVOT Vmean:  70.100 cm/s LVOT VTI:    0.242 m  AORTA Ao Root diam: 3.40 cm Ao Asc diam:  3.50 cm MITRAL VALVE               TRICUSPID VALVE MV Area (PHT): 2.54 cm    TR Peak grad:   55.7 mmHg MV Decel Time: 299 msec    TR Vmax:        373.00 cm/s MV E velocity: 83.60 cm/s MV A velocity: 82.80 cm/s  SHUNTS MV E/A ratio:  1.01        Systemic VTI:  0.24 m                            Systemic Diam: 2.00 cm Charlton Haws MD Electronically signed by Charlton Haws MD Signature Date/Time: 11/15/2022/10:27:56 AM    Final    DG Chest 2 View  Result Date: 11/14/2022 CLINICAL DATA:  Chest pain, shortness of breath EXAM: CHEST - 2 VIEW COMPARISON:  Chest radiograph 12/14/2020 FINDINGS: The heart is borderline enlarged. The upper mediastinal contours are normal, allowing for rightward patient rotation There are patchy opacities in the lung bases on the lateral projection. There is no other focal airspace opacity. There is no pulmonary edema. There is no pleural effusion or pneumothorax. There is no acute osseous abnormality. IMPRESSION: Patchy opacities in the lung bases on the lateral projection could reflect atelectasis or infection in the correct clinical setting. Electronically Signed   By: Lesia Hausen M.D.   On: 11/14/2022 19:47    Cardiac Studies:  ECG: SR anterolateral T wave inversions no ST elevation     Telemetry: NSR  Echo:  EF 40-45% septal, distal anterior, mid/distal inferior wall hypokinesis  Medications:    allopurinol  300 mg Oral Daily   aspirin  81 mg Oral Pre-Cath   aspirin EC  81 mg Oral Daily   enzalutamide  160 mg Oral Daily   irbesartan  300 mg Oral Daily   metoprolol tartrate  50 mg Oral BID   pantoprazole  40 mg Oral Daily   relugolix  120 mg Oral Daily      sodium chloride     heparin 1,250 Units/hr (11/16/22 9562)    Assessment/Plan:   SEMI:  pain free continue heparin Increase lopressor to 50 mg bid D/c norvasc ASA and start ARB given likely LAD infarct and decreased EF TTE pending Plan left heart cath with no LV Monday Sooner if recurrent pain Troponin peak 5707 Discussed cath with son and patient Good right radial pulse  CRF acute rise 2.05 this am 1.72  will see what EF is CXR atelectasis May need hydration Sunday night TTE  with RWMA consistent with MI.   Shared Decision Making/Informed Consent The risks [stroke (1 in 1000), death (1 in 1000), kidney failure [usually temporary] (1 in 500), bleeding (1 in 200), allergic reaction [possibly serious] (1 in 200)], benefits (diagnostic support and management of coronary artery disease) and alternatives of a cardiac catheterization were discussed in detail with Ms. Wanita Chamberlain and she is willing to proceed.  2. Oncology:  MGUs and prostate cancer S/P radioRx. Increase in free kappa light chain Bone mets Rx with Orgovyx and Xtandi   3. HTN:  see changes above d/c norvasc increase beta blocker continue ARB  4. HLD:  LDL 117 intolerant to statin/zetia in past will need PSK9 Rx   Charlton Haws 11/16/2022, 9:22 AM

## 2022-11-17 ENCOUNTER — Other Ambulatory Visit (HOSPITAL_COMMUNITY): Payer: Self-pay

## 2022-11-17 ENCOUNTER — Encounter (HOSPITAL_COMMUNITY): Payer: Self-pay | Admitting: Cardiology

## 2022-11-17 DIAGNOSIS — I214 Non-ST elevation (NSTEMI) myocardial infarction: Secondary | ICD-10-CM | POA: Diagnosis not present

## 2022-11-17 DIAGNOSIS — R001 Bradycardia, unspecified: Secondary | ICD-10-CM

## 2022-11-17 DIAGNOSIS — R9431 Abnormal electrocardiogram [ECG] [EKG]: Secondary | ICD-10-CM | POA: Insufficient documentation

## 2022-11-17 HISTORY — DX: Bradycardia, unspecified: R00.1

## 2022-11-17 LAB — TSH: TSH: 2.182 u[IU]/mL (ref 0.350–4.500)

## 2022-11-17 LAB — CBC
HCT: 29 % — ABNORMAL LOW (ref 39.0–52.0)
Hemoglobin: 9.6 g/dL — ABNORMAL LOW (ref 13.0–17.0)
MCH: 32.8 pg (ref 26.0–34.0)
MCHC: 33.1 g/dL (ref 30.0–36.0)
MCV: 99 fL (ref 80.0–100.0)
Platelets: 147 10*3/uL — ABNORMAL LOW (ref 150–400)
RBC: 2.93 MIL/uL — ABNORMAL LOW (ref 4.22–5.81)
RDW: 13.2 % (ref 11.5–15.5)
WBC: 7.5 10*3/uL (ref 4.0–10.5)
nRBC: 0 % (ref 0.0–0.2)

## 2022-11-17 LAB — BASIC METABOLIC PANEL
Anion gap: 10 (ref 5–15)
BUN: 35 mg/dL — ABNORMAL HIGH (ref 8–23)
CO2: 20 mmol/L — ABNORMAL LOW (ref 22–32)
Calcium: 9.2 mg/dL (ref 8.9–10.3)
Chloride: 104 mmol/L (ref 98–111)
Creatinine, Ser: 1.79 mg/dL — ABNORMAL HIGH (ref 0.61–1.24)
GFR, Estimated: 38 mL/min — ABNORMAL LOW (ref >60)
Glucose, Bld: 134 mg/dL — ABNORMAL HIGH (ref 70–99)
Potassium: 3.9 mmol/L (ref 3.5–5.1)
Sodium: 134 mmol/L — ABNORMAL LOW (ref 135–145)

## 2022-11-17 LAB — POCT ACTIVATED CLOTTING TIME
Activated Clotting Time: 140 s
Activated Clotting Time: 201 s

## 2022-11-17 LAB — HEPARIN LEVEL (UNFRACTIONATED): Heparin Unfractionated: <0.1 [IU]/mL — ABNORMAL LOW (ref 0.30–0.70)

## 2022-11-17 LAB — BRAIN NATRIURETIC PEPTIDE: B Natriuretic Peptide: 678.4 pg/mL — ABNORMAL HIGH (ref 0.0–100.0)

## 2022-11-17 LAB — MAGNESIUM: Magnesium: 1.8 mg/dL (ref 1.7–2.4)

## 2022-11-17 MED ORDER — MAGNESIUM SULFATE 2 GM/50ML IV SOLN
2.0000 g | Freq: Once | INTRAVENOUS | Status: AC
  Administered 2022-11-17: 2 g via INTRAVENOUS
  Filled 2022-11-17: qty 50

## 2022-11-17 MED ORDER — CARMEX CLASSIC LIP BALM EX OINT
TOPICAL_OINTMENT | CUTANEOUS | Status: DC | PRN
  Filled 2022-11-17 (×2): qty 10

## 2022-11-17 MED ORDER — POTASSIUM CHLORIDE CRYS ER 10 MEQ PO TBCR
10.0000 meq | EXTENDED_RELEASE_TABLET | Freq: Once | ORAL | Status: AC
  Administered 2022-11-17: 10 meq via ORAL
  Filled 2022-11-17: qty 1

## 2022-11-17 MED ORDER — ATROPINE SULFATE 1 MG/10ML IJ SOSY
PREFILLED_SYRINGE | INTRAMUSCULAR | Status: AC
  Filled 2022-11-17: qty 10

## 2022-11-17 NOTE — TOC Benefit Eligibility Note (Signed)
Patient Product/process development scientist completed.    The patient is insured through [employer]. Patient has Medicare and is not eligible for a copay card, but may be able to apply for patient assistance, if available.    Ran test claim for Brilinta 90 mg and the current 30 day co-pay is $0.00.   This test claim was processed through [facility]- copay amounts may vary at other pharmacies due to pharmacy/plan contracts, or as the patient moves through the different stages of their insurance plan.     Roland Earl, CPHT Pharmacy Technician III Certified Patient Advocate [facility] Patient Advocate Team Direct Number: [phone]  Fax: [phone]

## 2022-11-17 NOTE — Progress Notes (Signed)
R femoral venous sheath removed by Nolon Stalls RN & Holland Falling RN at this time without complications.

## 2022-11-17 NOTE — Progress Notes (Signed)
CARDIAC REHAB PHASE I   PRE:  Rate/Rhythm: 60 SR with many PACs    BP: sitting 138/73    SpO2: 100 RA  MODE:  Ambulation: 370 ft   POST:  Rate/Rhythm: 78 SR occ PACs    BP: sitting 154/72     SpO2: 100 RA  Pt able to ambulate hall without c/o. Sts he feels much improved and was not able to walk this far in months. Less PACs while walking. To recliner.   Discussed with pt and daughter MI, stent, Brilinta importance, diet, exercise, NTG and CRPII. Pt receptive. Will refer to [place] CRPII.  Encouraged more ambulation.  [phone]  Ethelda Chick BS, ACSM-CEP 11/17/2022 12:05 PM

## 2022-11-17 NOTE — Progress Notes (Signed)
Mg 1.8 in setting of QT prolongation. D/w pharmD Leota Sauers. Will rx mag sulfate 2g now then recheck in AM.

## 2022-11-17 NOTE — Progress Notes (Addendum)
Progress Note  Patient Name: Ricky COTA Sr. Date of Encounter: 11/17/2022  Primary Cardiologist: Charlton Haws, MD  Subjective   "I feel 100% better than yesterday." No CP, dizziness. SBP 90s early this AM, last checked 114. On IV NTG @ 4mcg/min.  Inpatient Medications    Scheduled Meds:  allopurinol  300 mg Oral Daily   aspirin EC  81 mg Oral Daily   Chlorhexidine Gluconate Cloth  6 each Topical Daily   enzalutamide  160 mg Oral Daily   irbesartan  300 mg Oral Daily   pantoprazole  40 mg Oral Daily   relugolix  120 mg Oral Daily   sodium chloride flush  3 mL Intravenous Q12H   ticagrelor  90 mg Oral BID   Continuous Infusions:  sodium chloride     heparin Stopped (11/16/22 1355)   nitroGLYCERIN 40 mcg/min (11/17/22 0700)   PRN Meds: sodium chloride, acetaminophen, melatonin, nitroGLYCERIN, ondansetron (ZOFRAN) IV, mouth rinse, sodium chloride flush   Vital Signs    Vitals:   11/17/22 0700 11/17/22 0715 11/17/22 0730 11/17/22 0745  BP: 114/60 138/76 (!) 129/107 (!) 90/58  Pulse: (!) 57 (!) 52 60 (!) 58  Resp: 14 (!) 7 17 16   Temp:      TempSrc:      SpO2: 97% 97% 98% 100%  Weight:      Height:        Intake/Output Summary (Last 24 hours) at 11/17/2022 0751 Last data filed at 11/17/2022 0700 Gross per 24 hour  Intake 1181.07 ml  Output --  Net 1181.07 ml      11/15/2022   11:08 AM 11/14/2022    6:18 PM 08/12/2022    8:51 AM  Last 3 Weights  Weight (lbs) 210 lb 5.1 oz 210 lb 209 lb 1.6 oz  Weight (kg) 95.4 kg 95.255 kg 94.847 kg     Telemetry    NSR/SB 50s-60s presently, overnight with SB 40s with frequent PACs, rare PVC - Personally Reviewed  ECG    SB 51bpm with occasional PAC, LAD, evolution of marked STTW changes diffusely but most pronounced II, III, avF, V3-V6 with prolonged QTC - Personally Reviewed  Physical Exam   GEN: No acute distress.  HEENT: Normocephalic, atraumatic, sclera non-icteric. Neck: No JVD or bruits. Cardiac:  irregular due to ectopy, no murmurs, rubs, or gallops.  Respiratory: Clear to auscultation bilaterally. Breathing is unlabored. GI: Soft, nontender, non-distended, BS +x 4. MS: no deformity. Extremities: No clubbing or cyanosis. No edema. Distal pedal pulses are 2+ and equal bilaterally. Right radial cath site without hematoma or ecchymosis; good pulse. Right venous sheath access without hematoma or bleeding. Dressings removed. Nurse will place tegaderm over sites. Neuro:  AAOx3. Follows commands. Psych:  Responds to questions appropriately with a normal affect.  Labs    High Sensitivity Troponin:   Recent Labs  Lab 11/14/22 1821 11/14/22 2035 11/15/22 0039  TROPONINIHS 144* 2,132* 5,707*      Cardiac EnzymesNo results for input(s): "TROPONINI" in the last 168 hours. No results for input(s): "TROPIPOC" in the last 168 hours.   Chemistry Recent Labs  Lab 11/14/22 1821 11/15/22 0039 11/16/22 0432 11/17/22 0322  NA 137 136 136 134*  K 4.0 3.8 4.0 3.9  CL 102 102 102 104  CO2 21* 23 23 20*  GLUCOSE 142* 155* 131* 134*  BUN 39* 39* 39* 35*  CREATININE 2.05* 1.95* 1.72* 1.79*  CALCIUM 9.5 9.4 9.3 9.2  PROT 7.3  --   --   --  ALBUMIN 3.6  --   --   --   AST 26  --   --   --   ALT 13  --   --   --   ALKPHOS 66  --   --   --   BILITOT 1.0  --   --   --   GFRNONAA 32* 34* 40* 38*  ANIONGAP 14 11 11 10      Hematology Recent Labs  Lab 11/15/22 0039 11/16/22 0432 11/17/22 0322  WBC 6.0 6.3 7.5  RBC 2.86* 2.83* 2.93*  HGB 9.5* 9.1* 9.6*  HCT 28.1* 27.7* 29.0*  MCV 98.3 97.9 99.0  MCH 33.2 32.2 32.8  MCHC 33.8 32.9 33.1  RDW 13.3 13.2 13.2  PLT 151 145* 147*    BNP Recent Labs  Lab 11/17/22 0322  BNP 678.4*     DDimer No results for input(s): "DDIMER" in the last 168 hours.   Radiology    CARDIAC CATHETERIZATION  Result Date: 11/16/2022 Images from the original result were not included. Coronary intervention 11/16/2022: LM: 20% disease LAD: Prox-mid 95%  calcific stenosis (culprit lesion)          Mid 30% disease Lcx: Prox 50% stenosis        OM1 50% stenosis RCA: Prox CTO. Left-to-right collaterals Temporary transvenous pacemaker placement (removed at the end of the case) Intravascular ultrasound (IVUS) Successful percutaneous coronary intervention mid LAD        PTCA and stent placement 3.5 X 20 mm Synergy drug-eluting stent        Post dilatation using 3.75X12 and 4.0X12 mm [place] balloons up to 20 atm        Culprit lesion 95%--0% stenosis, TIMI flow I-->III, MSA 10.6 mm2        Loss of one septal branch with mild burning sensation. No conduction abnormality noted, in fact rate is improved from 40 bpm to >60 bpm, not requiring any temporary pacing. Recommend overnight IV NTG. Elder Negus, MD Pager: [phone] Office: [phone]   ECHOCARDIOGRAM COMPLETE  Result Date: 11/15/2022    ECHOCARDIOGRAM REPORT   Patient Name:   Ricky MASIN Sr. Date of Exam: 11/15/2022 Medical Rec #:  [id number]             Height:       75.0 in Accession #:    [id number]            Weight:       210.0 lb Date of Birth:  1945/07/25             BSA:          2.240 m Patient Age:    77 years              BP:           102/85 mmHg Patient Gender: M                     HR:           71 bpm. Exam Location:  Inpatient Procedure: 2D Echo, Cardiac Doppler, Color Doppler and Strain Analysis Indications:    NSTEMI  History:        Patient has prior history of Echocardiogram examinations, most                 recent 11/06/2017. Cancer; Risk Factors:Hypertension,                 Dyslipidemia and Diabetes.  Sonographer:    Delcie Roch RDCS Referring Phys: [id number] Elmon Kirschner  Sonographer Comments: Global longitudinal strain was attempted. IMPRESSIONS  1. Septal, apical distal anterior, mid/distal inferior wall hypokinesis . Left ventricular ejection fraction, by estimation, is 40 to 45%. The left ventricle has mildly decreased function. The left ventricle demonstrates regional  wall motion abnormalities (see scoring diagram/findings for description). The left ventricular internal cavity size was mildly dilated. There is mild left ventricular hypertrophy. Left ventricular diastolic parameters are consistent with Grade I diastolic dysfunction (impaired relaxation).  2. Right ventricular systolic function is normal. The right ventricular size is normal. There is moderately elevated pulmonary artery systolic pressure.  3. Left atrial size was mildly dilated.  4. The mitral valve is normal in structure. Mild mitral valve regurgitation. No evidence of mitral stenosis.  5. The aortic valve is tricuspid. There is mild calcification of the aortic valve. Aortic valve regurgitation is mild. Aortic valve sclerosis is present, with no evidence of aortic valve stenosis.  6. The inferior vena cava is normal in size with greater than 50% respiratory variability, suggesting right atrial pressure of 3 mmHg. FINDINGS  Left Ventricle: Septal, apical distal anterior, mid/distal inferior wall hypokinesis. Left ventricular ejection fraction, by estimation, is 40 to 45%. The left ventricle has mildly decreased function. The left ventricle demonstrates regional wall motion  abnormalities. The left ventricular internal cavity size was mildly dilated. There is mild left ventricular hypertrophy. Left ventricular diastolic parameters are consistent with Grade I diastolic dysfunction (impaired relaxation). Right Ventricle: The right ventricular size is normal. No increase in right ventricular wall thickness. Right ventricular systolic function is normal. There is moderately elevated pulmonary artery systolic pressure. The tricuspid regurgitant velocity is 3.73 m/s, and with an assumed right atrial pressure of 3 mmHg, the estimated right ventricular systolic pressure is 58.7 mmHg. Left Atrium: Left atrial size was mildly dilated. Right Atrium: Right atrial size was normal in size. Pericardium: There is no evidence of  pericardial effusion. Mitral Valve: The mitral valve is normal in structure. Mild mitral valve regurgitation. No evidence of mitral valve stenosis. Tricuspid Valve: The tricuspid valve is normal in structure. Tricuspid valve regurgitation is mild . No evidence of tricuspid stenosis. Aortic Valve: The aortic valve is tricuspid. There is mild calcification of the aortic valve. Aortic valve regurgitation is mild. Aortic valve sclerosis is present, with no evidence of aortic valve stenosis. Pulmonic Valve: The pulmonic valve was normal in structure. Pulmonic valve regurgitation is trivial. No evidence of pulmonic stenosis. Aorta: The aortic root is normal in size and structure. Venous: The inferior vena cava is normal in size with greater than 50% respiratory variability, suggesting right atrial pressure of 3 mmHg. IAS/Shunts: No atrial level shunt detected by color flow Doppler.  LEFT VENTRICLE PLAX 2D LVIDd:         5.40 cm   Diastology LVIDs:         3.40 cm   LV e' medial:    5.22 cm/s LV PW:         1.10 cm   LV E/e' medial:  16.0 LV IVS:        1.30 cm   LV e' lateral:   5.22 cm/s LVOT diam:     2.00 cm   LV E/e' lateral: 16.0 LV SV:         76 LV SV Index:   34 LVOT Area:     3.14 cm  RIGHT VENTRICLE  IVC RV Basal diam:  2.80 cm     IVC diam: 1.90 cm RV S prime:     11.70 cm/s TAPSE (M-mode): 1.9 cm LEFT ATRIUM             Index        RIGHT ATRIUM           Index LA diam:        3.90 cm 1.74 cm/m   RA Area:     14.00 cm LA Vol (A2C):   74.2 ml 33.12 ml/m  RA Volume:   33.50 ml  14.95 ml/m LA Vol (A4C):   68.6 ml 30.62 ml/m LA Biplane Vol: 73.8 ml 32.94 ml/m  AORTIC VALVE LVOT Vmax:   104.00 cm/s LVOT Vmean:  70.100 cm/s LVOT VTI:    0.242 m  AORTA Ao Root diam: 3.40 cm Ao Asc diam:  3.50 cm MITRAL VALVE               TRICUSPID VALVE MV Area (PHT): 2.54 cm    TR Peak grad:   55.7 mmHg MV Decel Time: 299 msec    TR Vmax:        373.00 cm/s MV E velocity: 83.60 cm/s MV A velocity: 82.80 cm/s   SHUNTS MV E/A ratio:  1.01        Systemic VTI:  0.24 m                            Systemic Diam: 2.00 cm Charlton Haws MD Electronically signed by Charlton Haws MD Signature Date/Time: 11/15/2022/10:27:56 AM    Final     Cardiac Studies   2D echo 11/15/22  1. Septal, apical distal anterior, mid/distal inferior wall hypokinesis .  Left ventricular ejection fraction, by estimation, is 40 to 45%. The left  ventricle has mildly decreased function. The left ventricle demonstrates  regional wall motion  abnormalities (see scoring diagram/findings for description). The left  ventricular internal cavity size was mildly dilated. There is mild left  ventricular hypertrophy. Left ventricular diastolic parameters are  consistent with Grade I diastolic  dysfunction (impaired relaxation).   2. Right ventricular systolic function is normal. The right ventricular  size is normal. There is moderately elevated pulmonary artery systolic  pressure.   3. Left atrial size was mildly dilated.   4. The mitral valve is normal in structure. Mild mitral valve  regurgitation. No evidence of mitral stenosis.   5. The aortic valve is tricuspid. There is mild calcification of the  aortic valve. Aortic valve regurgitation is mild. Aortic valve sclerosis  is present, with no evidence of aortic valve stenosis.   6. The inferior vena cava is normal in size with greater than 50%  respiratory variability, suggesting right atrial pressure of 3 mmHg.   Cath 11/16/22 Coronary intervention 11/16/2022: LM: 20% disease LAD: Prox-mid 95% calcific stenosis (culprit lesion)          Mid 30% disease Lcx: Prox 50% stenosis        OM1 50% stenosis RCA: Prox CTO. Left-to-right collaterals    Temporary transvenous pacemaker placement (removed at the end of the case) Intravascular ultrasound (IVUS) Successful percutaneous coronary intervention mid LAD        PTCA and stent placement 3.5 X 20 mm Synergy drug-eluting stent        Post  dilatation using 3.75X12 and 4.0X12 mm [place] balloons up to 20 atm  Culprit lesion 95%--0% stenosis, TIMI flow I-->III, MSA 10.6 mm2        Loss of one septal branch with mild burning sensation. No conduction abnormality noted, in fact rate is improved from 40 bpm to >60 bpm, not requiring any temporary pacing. Recommend overnight IV NTG.  Patient Profile     77 y.o. 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 Presented 9/6 with intermittent CP x 3 weeks, worse day of admission, found to have NSTEMI, AKI, new cardiomyopathy. EKG showed  significant anterolateral ST depressions, mild inferior ST depression and mild aVR elevation but not felt to meet STEMI criteria.  Assessment & Plan    1. NSTEMI/CAD - went for urgent Sunday cath 11/16/22 with 95% prox-mid LAD (culprit) s/p PTCA/DES with loss of one septal branch, otherwise residual disease above treated medically (20% LM, 30% mLAD, 50% pCx, 50% OM1, CTO prox RCA with L-R collaterals) - current rx ASA 81mg  daily, Brilinta 90mg  BID - no BB given bradycardia - h/o statin/Zetia intolerance, on Repatha PTA, would refer back to lipid clinic at OP f/u to discuss options, LDL 117, trig 307 this admission - phase 1 cardiac rehab ordered - patient desires TOC pharmacy at [place] for first month's fill - stop IV NTG today  2. Bradycardia, felt ischemic related, PACs - improved with PCI, did not require temp wire - HR still 40s overnight but 50s-60s this AM - avoid AVN blocking agents - add TSH to labs  3. Prolonged QTC - suspect ischemic related, K at 3.9 - will give x1 - add Mg to labs - continue to follow - avoid additional QT  prolonging meds  4. ICM/new acute HFmrEF, HTN with intermittent hypotension seen - echo 2019 with EF 50-55% - this admission, EF 40-45% with septal, apical distal anterior, mid/distal inferior wall hypokinesis, mild LVH, G1DD, moderate pulmonary HTN, mild LAE, mild MR, aortic sclerosis without stenosis, LVEDP by cath - home regimen included amlodipine/valsartan 5mg /320mg , on irbesartan 300mg  daily here - will hold given SBP 90s this AM, stop IV NTG - pending discussion with MD  5. Moderate pHTN by echo, previously mild in 2019 - follow clinically, LVEDP by cath, await MD input re: med rx - consider exploring OSA eval as OP  6. AKI on CKD 3b - baseline Cr 1.73 in 07/2022, admitted at 2.05, relatively stable in 1.7's today - continue to follow in context above  7. Carotid artery disease, disturbed R subclavian flow 2022 - 1-39% RICA, 40-59% LICA 09/2020 - consider updating as outpatient  8. MGUS/prostate CA - on Xtandi and Orgovyx - continue OP f/u   For questions or updates, please contact [facility] Please consult [email or web address] for contact info under Cardiology/STEMI.  Signed, Laurann Montana, PA-C 11/17/2022, 7:51 AM

## 2022-11-18 ENCOUNTER — Other Ambulatory Visit (HOSPITAL_COMMUNITY): Payer: Self-pay

## 2022-11-18 ENCOUNTER — Encounter (HOSPITAL_COMMUNITY): Payer: Self-pay | Admitting: Cardiology

## 2022-11-18 DIAGNOSIS — I214 Non-ST elevation (NSTEMI) myocardial infarction: Secondary | ICD-10-CM | POA: Diagnosis not present

## 2022-11-18 LAB — CBC
HCT: 24.5 % — ABNORMAL LOW (ref 39.0–52.0)
Hemoglobin: 8.4 g/dL — ABNORMAL LOW (ref 13.0–17.0)
MCH: 34 pg (ref 26.0–34.0)
MCHC: 34.3 g/dL (ref 30.0–36.0)
MCV: 99.2 fL (ref 80.0–100.0)
Platelets: 121 10*3/uL — ABNORMAL LOW (ref 150–400)
RBC: 2.47 MIL/uL — ABNORMAL LOW (ref 4.22–5.81)
RDW: 13.1 % (ref 11.5–15.5)
WBC: 7 10*3/uL (ref 4.0–10.5)
nRBC: 0 % (ref 0.0–0.2)

## 2022-11-18 LAB — BASIC METABOLIC PANEL
Anion gap: 7 (ref 5–15)
BUN: 34 mg/dL — ABNORMAL HIGH (ref 8–23)
CO2: 22 mmol/L (ref 22–32)
Calcium: 8.9 mg/dL (ref 8.9–10.3)
Chloride: 105 mmol/L (ref 98–111)
Creatinine, Ser: 1.73 mg/dL — ABNORMAL HIGH (ref 0.61–1.24)
GFR, Estimated: 40 mL/min — ABNORMAL LOW (ref >60)
Glucose, Bld: 140 mg/dL — ABNORMAL HIGH (ref 70–99)
Potassium: 3.7 mmol/L (ref 3.5–5.1)
Sodium: 134 mmol/L — ABNORMAL LOW (ref 135–145)

## 2022-11-18 LAB — LIPOPROTEIN A (LPA): Lipoprotein (a): 301.2 nmol/L — ABNORMAL HIGH (ref <75.0)

## 2022-11-18 LAB — MAGNESIUM: Magnesium: 2.1 mg/dL (ref 1.7–2.4)

## 2022-11-18 MED ORDER — POTASSIUM CHLORIDE CRYS ER 20 MEQ PO TBCR
20.0000 meq | EXTENDED_RELEASE_TABLET | Freq: Once | ORAL | Status: AC
  Administered 2022-11-18: 20 meq via ORAL
  Filled 2022-11-18: qty 1

## 2022-11-18 MED ORDER — LOSARTAN POTASSIUM 25 MG PO TABS
25.0000 mg | ORAL_TABLET | Freq: Every day | ORAL | Status: DC
  Administered 2022-11-18 – 2022-11-19 (×2): 25 mg via ORAL
  Filled 2022-11-18 (×2): qty 1

## 2022-11-18 MED FILL — Nitroglycerin IV Soln 100 MCG/ML in D5W: INTRA_ARTERIAL | Qty: 10 | Status: AC

## 2022-11-18 NOTE — TOC CM/SW Note (Signed)
Transition of Care [facility]) - Inpatient Brief Assessment   Patient Details  Name: Ricky FLOREZ Sr. MRN: [id number] Date of Birth: [birth year]  [facility]) CM/SW Contact:    Gala Lewandowsky, RN Phone Number: 11/18/2022, 11:16 AM   Clinical Narrative: Patient presented for chest pain- post cath 11-16-22. Brilinta benefits check completed. No further needs identified at this time. Case Manager will continue to follow for disposition needs.    Transition of Care Asessment: Insurance and Status: Insurance coverage has been reviewed Patient has primary care physician: Yes Prior/Current Home Services: No current home services Social Determinants of Health Reivew: SDOH reviewed no interventions necessary Readmission risk has been reviewed: Yes Transition of care needs: no transition of care needs at this time

## 2022-11-18 NOTE — Progress Notes (Signed)
Per MD, no need for DVT ppx at this time since patient has been up and ambulating. Transfer ordered placed.

## 2022-11-18 NOTE — Progress Notes (Signed)
Heart Failure Nurse Navigator Progress Note  PCP: [name], MD PCP-Cardiologist: Dr. Eden Emms Admission Diagnosis: NSTEMI Admitted from: Home  Presentation:   Ricky Panther Sr. presented with intermittent chest pain   ECHO/ LVEF: 40-45%  Clinical Course:  Past Medical History:  Diagnosis Date   Borderline diabetes    DIET CONTROLLED   Dyslipidemia    Elevated PSA    External carotid artery stenosis    MILD-- BILATERAL   Fall 11/05/2017   GERD (gastroesophageal reflux disease)    Gout, arthritis    Hearing loss in left ear    Heart murmur    MILD --  ASYMPTOMATIC   History of gastric ulcer    REMOTE HX   Hypertension    Pre-diabetes    Prostate cancer (HCC) 12/21/2012   Gleason 3+4=7   Seizure-like activity (HCC)    Syncope and collapse    Wears glasses      Social History   Socioeconomic History   Marital status: Married    Spouse name: geraldine   Number of children: 3   Years of education: some college   Highest education level: Not on file  Occupational History   Occupation: retired  Tobacco Use   Smoking status: Former    Current packs/day: 0.00    Average packs/day: 1 pack/day for 30.0 years (30.0 ttl pk-yrs)    Types: Cigarettes    Start date: 12/16/1967    Quit date: 12/15/1997    Years since quitting: 24.9   Smokeless tobacco: Never  Vaping Use   Vaping status: Never Used  Substance and Sexual Activity   Alcohol use: Yes    Alcohol/week: 20.0 standard drinks of alcohol    Types: 20 Shots of liquor per week    Comment: OCCASIONAL, 12/28/17 stopped 11/05/17   Drug use: No   Sexual activity: Not on file  Other Topics Concern   Not on file  Social History Narrative   Lives with wife at home    caffeine use- occas, mostly decaf coffee   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    Social Determinants of Health   Financial Resource Strain: Not on file  Food Insecurity: No Food Insecurity (11/15/2022)   Hunger Vital Sign    Worried About Running Out of Food in  the Last Year: Never true    Ran Out of Food in the Last Year: Never true  Transportation Needs: No Transportation Needs (11/15/2022)   PRAPARE - Administrator, Civil Service (Medical): No    Lack of Transportation (Non-Medical): No  Physical Activity: Not on file  Stress: Not on file  Social Connections: Not on file    Education Assessment and Provision:  Detailed education and instructions provided on heart failure disease management including the following:  Signs and symptoms of Heart Failure When to call the physician Importance of daily weights Low sodium diet Fluid restriction Medication management Anticipated future follow-up appointments  Patient education given on each of the above topics.  Patient acknowledges understanding via teach back method and acceptance of all instructions.  Education Materials:  "Living Better With Heart Failure" Booklet, HF zone tool, & Daily Weight Tracker Tool.  Patient has scale at home: yes Patient has pill box at home: yes    High Risk Criteria for Readmission and/or Poor Patient Outcomes: Heart failure hospital admissions (last 6 months): 0  No Show rate: 2% Difficult social situation: no Demonstrates medication adherence: yes Primary Language: English Literacy level: comprehension and writing  Barriers of Care:   Diet and fluid restriction  Considerations/Referrals:   Referral made to Heart Failure Pharmacist Stewardship: yes Referral made to Heart Failure CSW/NCM TOC: no Referral made to Heart & Vascular TOC clinic: yes  9/18  Items for Follow-up on DC/TOC: Diet and fluid restriction   Matteo Banke,RN, BSN,MSN Heart Failure Nurse Navigator. Contact by secure chat only.

## 2022-11-18 NOTE — Progress Notes (Addendum)
Progress Note  Patient Name: Ricky DHALIWAL Sr. Date of Encounter: 11/18/2022  Primary Cardiologist: Charlton Haws, MD  Subjective   Feeling great from cardiac standpoint. No CP or SOB. Hemoglobin slowly drifting down. He has noticed a little bit of bright red blood (tiny amount) when blotting tip of penis after urinating. No hematuria noted. He has no other complaints and was not catheterized this admission.  Inpatient Medications    Scheduled Meds:  allopurinol  300 mg Oral Daily   aspirin EC  81 mg Oral Daily   Chlorhexidine Gluconate Cloth  6 each Topical Daily   enzalutamide  160 mg Oral Daily   pantoprazole  40 mg Oral Daily   relugolix  120 mg Oral Daily   sodium chloride flush  3 mL Intravenous Q12H   ticagrelor  90 mg Oral BID   Continuous Infusions:  sodium chloride     PRN Meds: sodium chloride, acetaminophen, lip balm, melatonin, nitroGLYCERIN, ondansetron (ZOFRAN) IV, mouth rinse, sodium chloride flush   Vital Signs    Vitals:   11/18/22 0351 11/18/22 0400 11/18/22 0500 11/18/22 0700  BP:  131/78 (!) 119/47 (!) 147/71  Pulse:  71 60 (!) 59  Resp:  (!) 8 15 10   Temp: 98.1 F (36.7 C)     TempSrc: Oral     SpO2:  96% 98% 100%  Weight:      Height:        Intake/Output Summary (Last 24 hours) at 11/18/2022 0813 Last data filed at 11/18/2022 0700 Gross per 24 hour  Intake 1586.59 ml  Output 825 ml  Net 761.59 ml      11/15/2022   11:08 AM 11/14/2022    6:18 PM 08/12/2022    8:51 AM  Last 3 Weights  Weight (lbs) 210 lb 5.1 oz 210 lb 209 lb 1.6 oz  Weight (kg) 95.4 kg 95.255 kg 94.847 kg     Telemetry    NSR, occ PACs, occasional ectopic atrial beating, QTC ~510-542ms on telemetry - Personally Reviewed  Physical Exam   GEN: No acute distress.  HEENT: Normocephalic, atraumatic, sclera non-icteric. Neck: No JVD or bruits. Cardiac: RRR no murmurs, rubs, or gallops.  Respiratory: Clear to auscultation bilaterally. Breathing is unlabored. GI:  Soft, nontender, non-distended, BS +x 4. MS: no deformity. Extremities: No clubbing or cyanosis. No edema. Distal pedal pulses are 2+ and equal bilaterally. Right radial cath site without hematoma or ecchymosis; good pulse. Neuro:  AAOx3. Follows commands. Psych:  Responds to questions appropriately with a normal affect.  Labs    High Sensitivity Troponin:   Recent Labs  Lab 11/14/22 1821 11/14/22 2035 11/15/22 0039  TROPONINIHS 144* 2,132* 5,707*      Cardiac EnzymesNo results for input(s): "TROPONINI" in the last 168 hours. No results for input(s): "TROPIPOC" in the last 168 hours.   Chemistry Recent Labs  Lab 11/14/22 1821 11/15/22 0039 11/16/22 0432 11/17/22 0322 11/18/22 0233  NA 137   < > 136 134* 134*  K 4.0   < > 4.0 3.9 3.7  CL 102   < > 102 104 105  CO2 21*   < > 23 20* 22  GLUCOSE 142*   < > 131* 134* 140*  BUN 39*   < > 39* 35* 34*  CREATININE 2.05*   < > 1.72* 1.79* 1.73*  CALCIUM 9.5   < > 9.3 9.2 8.9  PROT 7.3  --   --   --   --  ALBUMIN 3.6  --   --   --   --   AST 26  --   --   --   --   ALT 13  --   --   --   --   ALKPHOS 66  --   --   --   --   BILITOT 1.0  --   --   --   --   GFRNONAA 32*   < > 40* 38* 40*  ANIONGAP 14   < > 11 10 7    < > = values in this interval not displayed.     Hematology Recent Labs  Lab 11/16/22 0432 11/17/22 0322 11/18/22 0233  WBC 6.3 7.5 7.0  RBC 2.83* 2.93* 2.47*  HGB 9.1* 9.6* 8.4*  HCT 27.7* 29.0* 24.5*  MCV 97.9 99.0 99.2  MCH 32.2 32.8 34.0  MCHC 32.9 33.1 34.3  RDW 13.2 13.2 13.1  PLT 145* 147* 121*    BNP Recent Labs  Lab 11/17/22 0322  BNP 678.4*     DDimer No results for input(s): "DDIMER" in the last 168 hours.   Radiology    CARDIAC CATHETERIZATION  Result Date: 11/16/2022 Images from the original result were not included. Coronary intervention 11/16/2022: LM: 20% disease LAD: Prox-mid 95% calcific stenosis (culprit lesion)          Mid 30% disease Lcx: Prox 50% stenosis        OM1 50%  stenosis RCA: Prox CTO. Left-to-right collaterals Temporary transvenous pacemaker placement (removed at the end of the case) Intravascular ultrasound (IVUS) Successful percutaneous coronary intervention mid LAD        PTCA and stent placement 3.5 X 20 mm Synergy drug-eluting stent        Post dilatation using 3.75X12 and 4.0X12 mm Angelina balloons up to 20 atm        Culprit lesion 95%--0% stenosis, TIMI flow I-->III, MSA 10.6 mm2        Loss of one septal branch with mild burning sensation. No conduction abnormality noted, in fact rate is improved from 40 bpm to >60 bpm, not requiring any temporary pacing. Recommend overnight IV NTG. Elder Negus, MD Pager: [phone] Office: [phone]    Cardiac Studies    2D echo 11/15/22  1. Septal, apical distal anterior, mid/distal inferior wall hypokinesis .  Left ventricular ejection fraction, by estimation, is 40 to 45%. The left  ventricle has mildly decreased function. The left ventricle demonstrates  regional wall motion  abnormalities (see scoring diagram/findings for description). The left  ventricular internal cavity size was mildly dilated. There is mild left  ventricular hypertrophy. Left ventricular diastolic parameters are  consistent with Grade I diastolic  dysfunction (impaired relaxation).   2. Right ventricular systolic function is normal. The right ventricular  size is normal. There is moderately elevated pulmonary artery systolic  pressure.   3. Left atrial size was mildly dilated.   4. The mitral valve is normal in structure. Mild mitral valve  regurgitation. No evidence of mitral stenosis.   5. The aortic valve is tricuspid. There is mild calcification of the  aortic valve. Aortic valve regurgitation is mild. Aortic valve sclerosis  is present, with no evidence of aortic valve stenosis.   6. The inferior vena cava is normal in size with greater than 50%  respiratory variability, suggesting right atrial pressure of 3 mmHg.     Cath 11/16/22 Coronary intervention 11/16/2022: LM: 20% disease LAD: Prox-mid 95% calcific stenosis (  culprit lesion)          Mid 30% disease Lcx: Prox 50% stenosis        OM1 50% stenosis RCA: Prox CTO. Left-to-right collaterals    Temporary transvenous pacemaker placement (removed at the end of the case) Intravascular ultrasound (IVUS) Successful percutaneous coronary intervention mid LAD        PTCA and stent placement 3.5 X 20 mm Synergy drug-eluting stent        Post dilatation using 3.75X12 and 4.0X12 mm Allen balloons up to 20 atm        Culprit lesion 95%--0% stenosis, TIMI flow I-->III, MSA 10.6 mm2        Loss of one septal branch with mild burning sensation. No conduction abnormality noted, in fact rate is improved from 40 bpm to >60 bpm, not requiring any temporary pacing. Recommend overnight IV NTG.  Patient Profile     77 y.o. 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 Presented 9/6 with intermittent CP x 3 weeks, worse day of admission, found to have NSTEMI, AKI, new cardiomyopathy. EKG showed  significant anterolateral ST depressions, mild inferior ST depression and mild aVR elevation but not felt to meet STEMI criteria.   Assessment & Plan    1. NSTEMI/CAD - went for urgent Sunday cath 11/16/22 with 95% prox-mid LAD (culprit) s/p PTCA/DES with loss of one septal branch, otherwise residual disease above treated medically (20% LM, 30% mLAD, 50% pCx, 50% OM1, CTO prox RCA with L-R collaterals) - current rx ASA 81mg  daily, Brilinta 90mg  BID - no BB given bradycardia - h/o statin/Zetia intolerance, on Repatha PTA, would refer back to lipid clinic at OP f/u to  discuss additional options since patient reports he had been taking - LDL 117, trig 307 this admission - phase 1 cardiac rehab ordered - patient desires TOC pharmacy at [place] for first month's fill   2. Bradycardia, felt ischemic related, PACs - improved with PCI, did not require temp wire - conduction has remained relatively stable, no sustained bradycardia seen the last 24 hr - avoid AVN blocking agents - TSH wnl   3. Prolonged QTC, suspect ischemically mediated - Mg improved s/p supplementation - K 3.7 -> will give KCl x 1 today - continue to follow, repeat EKG in AM - avoid additional QT prolonging meds   4. ICM/new acute HFmrEF, HTN with intermittent hypotension seen - echo 2019 with EF 50-55% - this admission, EF 40-45% with septal, apical distal anterior, mid/distal inferior wall hypokinesis, mild LVH, G1DD, moderate pulmonary HTN, mild LAE, mild MR, aortic sclerosis without stenosis, LVEDP by cath - home regimen included amlodipine/valsartan 5mg /320mg , ARB held yesterday due to soft BP - will discuss regimen with MD   5. Moderate pHTN by echo, previously mild in 2019 - follow clinically, LVEDP by cath, await MD input re: med rx - consider exploring OSA eval as OP   6. AKI on CKD 3b - baseline Cr 1.73 in 07/2022, admitted at 2.05, relatively stable in 1.7's today - continue to follow in context above   7. Carotid artery disease, disturbed R subclavian flow 2022 - 1-39% RICA, 40-59% LICA 09/2020 - consider updating as outpatient   8. MGUS/prostate CA -  slight downtrend in Hgb/Plt today - on Xtandi and Orgovyx - continue OP f/u - will review plan with MD given very mild spotting of penis, also added hemoccult   Anticipate txf to floor for possible DC in AM  For questions or updates, please contact [facility] Please consult [email or web address] for contact info under Cardiology/STEMI.  Signed, Laurann Montana, PA-C 11/18/2022, 8:13 AM

## 2022-11-18 NOTE — Progress Notes (Signed)
CARDIAC REHAB PHASE I   PRE:  Rate/Rhythm: 61 SR with PACs  BP:  Sitting: 106/58      SaO2: 100 RA  MODE:  Ambulation: 370 ft   AD:   no AD  POST:  Rate/Rhythm: 84 NSR  BP:  Sitting: 137/71      SaO2: 97 RA  Pt amb with supervision assistance, pt denies CP and SOB during amb and was returned to room w/o complaint.   Pt walked great, no c/p  Faustino Congress  ACSM-CEP 9:50 AM 11/18/2022    Service time is from 0935 to 0950.

## 2022-11-18 NOTE — Plan of Care (Signed)

## 2022-11-18 NOTE — Progress Notes (Addendum)
   Heart Failure Stewardship Pharmacist Progress Note   PCP: [name], MD PCP-Cardiologist: Charlton Haws, MD    HPI:  77 yo M with PMH of HTN, CAD, HLD, MGUS, GERD, and prostate cancer.   Presented to the ED on 9/6 with chest pain and nausea. EKG with EMS shows significant ST depressions and mild inferior ST depression. Repeat EKG in the ED showed improvement in ST depression. Troponin 144. BNP elevated. CXR with patchy opacities in the lung bases reflecting atelectasis or infection. ECHO 9/7 with LVEF 40-45%, RWMA, mild LVH, G1DD, RV normal, mild MR. Patient had acute onset bradycardia and troponin 5707 on 9/8. Taken for acute cath and found to have 95% stenosis in LAD s/p PTCA and DES. Also had complete occlusion of RCA with collaterals from the left.   Current HF Medications: ACE/ARB/ARNI: losartan 25 mg daily  Prior to admission HF Medications: ACE/ARB/ARNI: valsartan 320 mg daily  Pertinent Lab Values: Serum creatinine 1.73, BUN 34, Potassium 3.7, Sodium 134, BNP 678.4, Magnesium 2.1  Vital Signs: Weight: 215 lbs (admission weight: 210 lbs) Blood pressure: ~130/70s  Heart rate: 60-70s  I/O: incomplete  Medication Assistance / Insurance Benefits Check: Does the patient have prescription insurance?  Yes Type of insurance plan: [facility] Medicare  Outpatient Pharmacy:  Prior to admission outpatient pharmacy: [facility] Is the patient willing to use [facility] TOC pharmacy at discharge? Yes Is the patient willing to transition their outpatient pharmacy to utilize a [facility] outpatient pharmacy?   Pending    Assessment: 1. Acute ICM (LVEF 40-45%). NYHA class II symptoms. - Not volume overloaded on exam - No BB with bradycardia - Continue losartan 25 mg daily. Dose held yesterday with hypotension. BP improved.  - Consider adding spironolactone 25 mg daily today. Creatinine has been stable. eGFR 40. - Consider adding SGLT2i prior to discharge   Plan: 1) Medication changes  recommended at this time: - Add spironolactone 25 mg daily  2) Patient assistance: - Entresto copay $0 - Farxiga/Jardiance copay $0  3)  Education  - To be completed prior to discharge  Sharen Hones, PharmD, BCPS Heart Failure Stewardship Pharmacist Phone [phone]

## 2022-11-19 ENCOUNTER — Other Ambulatory Visit (HOSPITAL_COMMUNITY): Payer: Self-pay

## 2022-11-19 ENCOUNTER — Telehealth: Payer: Self-pay | Admitting: Physician Assistant

## 2022-11-19 DIAGNOSIS — I25118 Atherosclerotic heart disease of native coronary artery with other forms of angina pectoris: Secondary | ICD-10-CM | POA: Insufficient documentation

## 2022-11-19 DIAGNOSIS — N179 Acute kidney failure, unspecified: Secondary | ICD-10-CM | POA: Insufficient documentation

## 2022-11-19 DIAGNOSIS — I779 Disorder of arteries and arterioles, unspecified: Secondary | ICD-10-CM | POA: Insufficient documentation

## 2022-11-19 DIAGNOSIS — I2510 Atherosclerotic heart disease of native coronary artery without angina pectoris: Secondary | ICD-10-CM | POA: Insufficient documentation

## 2022-11-19 DIAGNOSIS — I491 Atrial premature depolarization: Secondary | ICD-10-CM | POA: Insufficient documentation

## 2022-11-19 DIAGNOSIS — D472 Monoclonal gammopathy: Secondary | ICD-10-CM | POA: Insufficient documentation

## 2022-11-19 DIAGNOSIS — I272 Pulmonary hypertension, unspecified: Secondary | ICD-10-CM | POA: Insufficient documentation

## 2022-11-19 DIAGNOSIS — I255 Ischemic cardiomyopathy: Secondary | ICD-10-CM | POA: Insufficient documentation

## 2022-11-19 DIAGNOSIS — I214 Non-ST elevation (NSTEMI) myocardial infarction: Secondary | ICD-10-CM | POA: Diagnosis not present

## 2022-11-19 DIAGNOSIS — N189 Chronic kidney disease, unspecified: Secondary | ICD-10-CM | POA: Insufficient documentation

## 2022-11-19 DIAGNOSIS — I5022 Chronic systolic (congestive) heart failure: Secondary | ICD-10-CM | POA: Insufficient documentation

## 2022-11-19 HISTORY — DX: Chronic kidney disease, unspecified: N17.9

## 2022-11-19 LAB — BASIC METABOLIC PANEL
Anion gap: 11 (ref 5–15)
BUN: 33 mg/dL — ABNORMAL HIGH (ref 8–23)
CO2: 21 mmol/L — ABNORMAL LOW (ref 22–32)
Calcium: 9.4 mg/dL (ref 8.9–10.3)
Chloride: 105 mmol/L (ref 98–111)
Creatinine, Ser: 1.85 mg/dL — ABNORMAL HIGH (ref 0.61–1.24)
GFR, Estimated: 37 mL/min — ABNORMAL LOW (ref >60)
Glucose, Bld: 140 mg/dL — ABNORMAL HIGH (ref 70–99)
Potassium: 3.8 mmol/L (ref 3.5–5.1)
Sodium: 137 mmol/L (ref 135–145)

## 2022-11-19 LAB — CBC
HCT: 25.5 % — ABNORMAL LOW (ref 39.0–52.0)
Hemoglobin: 8.5 g/dL — ABNORMAL LOW (ref 13.0–17.0)
MCH: 32.7 pg (ref 26.0–34.0)
MCHC: 33.3 g/dL (ref 30.0–36.0)
MCV: 98.1 fL (ref 80.0–100.0)
Platelets: 136 10*3/uL — ABNORMAL LOW (ref 150–400)
RBC: 2.6 MIL/uL — ABNORMAL LOW (ref 4.22–5.81)
RDW: 13.2 % (ref 11.5–15.5)
WBC: 6.7 10*3/uL (ref 4.0–10.5)
nRBC: 0 % (ref 0.0–0.2)

## 2022-11-19 LAB — MAGNESIUM: Magnesium: 1.9 mg/dL (ref 1.7–2.4)

## 2022-11-19 MED ORDER — PRASUGREL HCL 10 MG PO TABS: 10.0000 mg | ORAL_TABLET | Freq: Every day | ORAL | Status: DC

## 2022-11-19 MED ORDER — PRASUGREL HCL 10 MG PO TABS
10.0000 mg | ORAL_TABLET | Freq: Every day | ORAL | 11 refills | Status: DC
  Filled 2022-11-19: qty 30, 30d supply, fill #0

## 2022-11-19 MED ORDER — ASPIRIN 81 MG PO TBEC
81.0000 mg | DELAYED_RELEASE_TABLET | Freq: Every day | ORAL | 2 refills | Status: DC
  Filled 2022-11-19: qty 120, 120d supply, fill #0

## 2022-11-19 MED ORDER — PRASUGREL HCL 10 MG PO TABS
60.0000 mg | ORAL_TABLET | Freq: Once | ORAL | Status: AC
  Administered 2022-11-19: 60 mg via ORAL
  Filled 2022-11-19 (×2): qty 6

## 2022-11-19 MED ORDER — LOSARTAN POTASSIUM 25 MG PO TABS
25.0000 mg | ORAL_TABLET | Freq: Every day | ORAL | 5 refills | Status: DC
  Filled 2022-11-19: qty 30, 30d supply, fill #0

## 2022-11-19 MED ORDER — NITROGLYCERIN 0.4 MG SL SUBL
0.4000 mg | SUBLINGUAL_TABLET | SUBLINGUAL | 3 refills | Status: DC | PRN
  Filled 2022-11-19: qty 25, 7d supply, fill #0

## 2022-11-19 NOTE — Progress Notes (Signed)
   Heart Failure Stewardship Pharmacist Progress Note   PCP: [name], MD PCP-Cardiologist: Charlton Haws, MD    HPI:  77 yo M with PMH of HTN, CAD, HLD, MGUS, GERD, and prostate cancer.   Presented to the ED on 9/6 with chest pain and nausea. EKG with EMS shows significant ST depressions and mild inferior ST depression. Repeat EKG in the ED showed improvement in ST depression. Troponin 144. BNP elevated. CXR with patchy opacities in the lung bases reflecting atelectasis or infection. ECHO 9/7 with LVEF 40-45%, RWMA, mild LVH, G1DD, RV normal, mild MR. Patient had acute onset bradycardia and troponin 5707 on 9/8. Taken for acute cath and found to have 95% stenosis in LAD s/p PTCA and DES. Also had complete occlusion of RCA with collaterals from the left.   Discharge HF Medications: ACE/ARB/ARNI: losartan 25 mg daily  Prior to admission HF Medications: ACE/ARB/ARNI: valsartan 320 mg daily  Pertinent Lab Values: Serum creatinine 1.85, BUN 33, Potassium 3.8, Sodium 137, BNP 678.4, Magnesium 1.9  Vital Signs: Weight: 212 lbs (admission weight: 210 lbs) Blood pressure: 110-130/60s  Heart rate: 60-70s  I/O: incomplete  Medication Assistance / Insurance Benefits Check: Does the patient have prescription insurance?  Yes Type of insurance plan: [facility] Medicare  Outpatient Pharmacy:  Prior to admission outpatient pharmacy: [facility] mail order and [facility] Is the patient willing to use [facility] TOC pharmacy at discharge? Yes Is the patient willing to transition their outpatient pharmacy to utilize a [facility] outpatient pharmacy?   No, gets mail order    Assessment: 1. Acute ICM (LVEF 40-45%). NYHA class II symptoms. - Not volume overloaded on exam - No BB with history of bradycardia - Continue losartan 25 mg daily - Consider adding spironolactone 25 mg daily at follow up. Creatinine bump today. - Consider adding SGLT2i prior to discharge   Plan: 1) Medication changes recommended at  this time: - Add spironolactone 25 mg daily at follow up   2) Patient assistance: - Entresto copay $0 - Farxiga/Jardiance copay $0  3)  Education  - Patient has been educated on current HF medications and potential additions to HF medication regimen - Patient verbalizes understanding that over the next few months, these medication doses may change and more medications may be added to optimize HF regimen - Patient has been educated on basic disease state pathophysiology and goals of therapy   Sharen Hones, PharmD, BCPS Heart Failure Stewardship Pharmacist Phone [phone]

## 2022-11-19 NOTE — Discharge Summary (Signed)
Discharge Summary    Patient ID: Ricky ANEZ Sr. MRN: [id number]; DOB: 04-Oct-1945  Admit date: 11/14/2022 Discharge date: 11/19/2022  PCP:  [name], MD   [facility] Providers Cardiologist:  Charlton Haws, MD        Discharge Diagnoses    Principal Problem:   NSTEMI (non-ST elevated myocardial infarction) [facility]) Active Problems:   Malignant neoplasm of prostate (HCC)   QT prolongation   Bradycardia   Premature atrial contractions   CAD in native artery   Acute kidney injury superimposed on chronic kidney disease (HCC)   Pulmonary hypertension, unspecified (HCC)   Ischemic cardiomyopathy   Heart failure with mildly reduced ejection fraction (HFmrEF) (HCC)   Carotid artery disease (HCC)   MGUS (monoclonal gammopathy of unknown significance)    Diagnostic Studies/Procedures    2D echo 11/15/22  1. Septal, apical distal anterior, mid/distal inferior wall hypokinesis .  Left ventricular ejection fraction, by estimation, is 40 to 45%. The left  ventricle has mildly decreased function. The left ventricle demonstrates  regional wall motion  abnormalities (see scoring diagram/findings for description). The left  ventricular internal cavity size was mildly dilated. There is mild left  ventricular hypertrophy. Left ventricular diastolic parameters are  consistent with Grade I diastolic  dysfunction (impaired relaxation).   2. Right ventricular systolic function is normal. The right ventricular  size is normal. There is moderately elevated pulmonary artery systolic  pressure.   3. Left atrial size was mildly dilated.   4. The mitral valve is normal in structure. Mild mitral valve  regurgitation. No evidence of mitral stenosis.   5. The aortic valve is tricuspid. There is mild calcification of the  aortic valve. Aortic valve regurgitation is mild. Aortic valve sclerosis  is present, with no evidence of aortic valve stenosis.   6. The inferior vena cava is  normal in size with greater than 50%  respiratory variability, suggesting right atrial pressure of 3 mmHg.    Cath 11/16/22 Coronary intervention 11/16/2022: LM: 20% disease LAD: Prox-mid 95% calcific stenosis (culprit lesion)          Mid 30% disease Lcx: Prox 50% stenosis        OM1 50% stenosis RCA: Prox CTO. Left-to-right collaterals    Temporary transvenous pacemaker placement (removed at the end of the case) Intravascular ultrasound (IVUS) Successful percutaneous coronary intervention mid LAD        PTCA and stent placement 3.5 X 20 mm Synergy drug-eluting stent        Post dilatation using 3.75X12 and 4.0X12 mm [place] balloons up to 20 atm        Culprit lesion 95%--0% stenosis, TIMI flow I-->III, MSA 10.6 mm2        Loss of one septal branch with mild burning sensation. No conduction abnormality noted, in fact rate is improved from 40 bpm to >60 bpm, not requiring any temporary pacing. Recommend overnight IV NTG. _____________   History of Present Illness     Ricky Rowland. is a 77 y.o. male with monoclonal gammopathy of unknown significance (MGUS)-with progression with 7% plasma cells on recent biopsy June 2024, metastatic prostate cancer on treatment, hypertension, hyperlipidemia, carotid artery stenosis (1-39% RICA, 40-59% LICA 09/2020), gout, GERD, syncope s/p ILR 2019 (last transmission 09/2021), remote ETOH, CKD stage 3b (Cr 1.7 in 07/2022). Presented 9/6 with intermittent CP x 3 weeks, worse day of admission, found to have NSTEMI, AKI, and new cardiomyopathy. EKG showed significant anterolateral ST  depressions, mild inferior ST depression and mild aVR elevation but not felt to meet STEMI criteria. He was started on ASA, heparin, beta blocker, and admitted tot he cardiology service for further management.   Hospital Course     1. NSTEMI/CAD - went for urgent Sunday cath 11/16/22 with 95% prox-mid LAD (culprit) s/p PTCA/DES with loss of one septal branch, otherwise residual disease  above treated medically (20% LM, 30% mLAD, 50% pCx, 50% OM1, CTO prox RCA with L-R collaterals) - started on ASA 81mg  daily, Brilinta 90mg  BID -> EMR alerted for drug/drug interaction with Diana Eves and Brilinta which pharmacist confirmed is a class X DDI. Dr. Eden Emms discussed case with interventionalist. Given clot burden and disease on cath, they both preferred Effient over Plavix. Patient was loaded with Effient 60mg  today, to start 10mg  daily tomorrow. Bleeding precautions reviewed. - no BB given bradycardia - h/o statin/Zetia intolerance, on Repatha PTA, would refer back to lipid clinic at OP f/u to discuss additional options since patient reports he had been taking - LDL 117, trig 307 this admission - patient desires TOC pharmacy at [place] for first month's fill   2. Bradycardia, felt ischemic related, PACs - improved with PCI, did not require temp wire - conduction has remained relatively stable, no sustained bradycardia seen in last 48 hours - avoid AVN blocking agents - TSH wnl   3. Prolonged QTC, suspect ischemically mediated, improved - EKG today showing NSR 63bpm, occasional PACs, left axis deviation, continued diffuse STTW changes suspected evolution of recent MI - QTC now   4. ICM/new acute HFmrEF, HTN with intermittent hypotension seen - echo 2019 with EF 50-55% - this admission, EF 40-45% with septal, apical distal anterior, mid/distal inferior wall hypokinesis, mild LVH, G1DD, moderate pulmonary HTN, mild LAE, mild MR, aortic sclerosis without stenosis, LVEDP by cath - home regimen included amlodipine/valsartan 5mg /320mg , required holding due to soft BP - now back on losartan - per Dr. Eden Emms, continue present regimen at [place] and consider outpatient titration based on BP trends and follow-up creatinine - recommend BMET in follow-up   5. Moderate pHTN by echo, previously mild in 2019 - follow clinically, LVEDP by cath, appears euvolemic day of discharge - consider  exploring OSA eval as OP, denies prior hx of   6. AKI on CKD 3b - baseline Cr 1.73 in 07/2022, admitted at 2.05, has remained 1.7-1.8 range post cath - continue to follow in context above   7. Carotid artery disease, disturbed R subclavian flow 2022 - 1-39% RICA, 40-59% LICA 09/2020 - consider updating as outpatient   8. MGUS/prostate CA - slight downtrend in Hgb/Plt this admission - on Xtandi and Orgovyx - continue OP f/u - had miniscule spotty penile bleeding on 11/18/22 that resolved, minuscule spotting on tissue when blowing his nose forcefully, no prominent or concerning bleeding - advised to monitor for such - recommend CBC in follow-up  Dr. Eden Emms has seen and examined the patient today and feels he is stable for discharge. The CHF TOC team has arranged TOC f/u for him on 9/18. We will still launch his TOC call even though this appt will take place before our 2 week follow-up scheduled for 9/25. Regarding additional medication instructions: - Tramadol removed from medicine list since patient reported he no longer takes this - The medical record system alerted for a significant drug interaction between colchicine (patient was taking PRN gout PTA) and enzalutamide Diana Eves) - it can cause severe toxicity of the colchicine.  We have discontinued colchicine. He was advised to discuss alternatives with prescriber of his colchicine.        Did the patient have an acute coronary syndrome (MI, NSTEMI, STEMI, etc) this admission?:  Yes                               AHA/ACC Clinical Performance & Quality Measures: Aspirin prescribed? - Yes ADP Receptor Inhibitor (Plavix/Clopidogrel, Brilinta/Ticagrelor or Effient/Prasugrel) prescribed (includes medically managed patients)? - Yes Beta Blocker prescribed? - No - bradycardia High Intensity Statin (Lipitor 40-80mg  or Crestor 20-40mg ) prescribed? - No - intolerance EF assessed during THIS hospitalization? - Yes For EF <40%, was ACEI/ARB  prescribed? - Yes For EF <40%, Aldosterone Antagonist (Spironolactone or Eplerenone) prescribed? - No - Reason:  recent AKI on CKD, also hypotension earlier this admission - can consider titrating as outpatient Cardiac Rehab Phase II ordered (including medically managed patients)? - Yes    _____________  Discharge Vitals Blood pressure (!) 108/57, pulse 75, temperature 97.9 F (36.6 C), temperature source Oral, resp. rate 20, height 6\' 3"  (1.905 m), weight 96.5 kg, SpO2 96%.  Filed Weights   11/15/22 1108 11/18/22 1222 11/19/22 0602  Weight: 95.4 kg 97.9 kg 96.5 kg    General: Well developed, well nourished, in no acute distress. Head: Normocephalic, atraumatic, sclera non-icteric, no xanthomas, nares are without discharge. Neck: Negative for carotid bruits. JVP not elevated. Lungs: Clear bilaterally to auscultation without wheezes, rales, or rhonchi. Breathing is unlabored. Heart: RRR S1 S2 without murmurs, rubs, or gallops.  Abdomen: Soft, non-tender, non-distended with normoactive bowel sounds. No rebound/guarding. Extremities: No clubbing or cyanosis. No edema. Distal pedal pulses are 2+ and equal bilaterally. No acute cath site complications seen. Neuro: Alert and oriented X 3. Moves all extremities spontaneously. Psych:  Responds to questions appropriately with a normal affect.   Labs & Radiologic Studies    CBC Recent Labs    11/18/22 0233 11/19/22 0411  WBC 7.0 6.7  HGB 8.4* 8.5*  HCT 24.5* 25.5*  MCV 99.2 98.1  PLT 121* 136*   Basic Metabolic Panel Recent Labs    16/10/96 0233 11/19/22 0411  NA 134* 137  K 3.7 3.8  CL 105 105  CO2 22 21*  GLUCOSE 140* 140*  BUN 34* 33*  CREATININE 1.73* 1.85*  CALCIUM 8.9 9.4  MG 2.1 1.9   High Sensitivity Troponin:   Recent Labs  Lab 11/14/22 1821 11/14/22 2035 11/15/22 0039  TROPONINIHS 144* 2,132* 5,707*    Thyroid Function Tests Recent Labs    11/17/22 0830  TSH 2.182   _____________  CARDIAC  CATHETERIZATION  Result Date: 11/16/2022 Images from the original result were not included. Coronary intervention 11/16/2022: LM: 20% disease LAD: Prox-mid 95% calcific stenosis (culprit lesion)          Mid 30% disease Lcx: Prox 50% stenosis        OM1 50% stenosis RCA: Prox CTO. Left-to-right collaterals Temporary transvenous pacemaker placement (removed at the end of the case) Intravascular ultrasound (IVUS) Successful percutaneous coronary intervention mid LAD        PTCA and stent placement 3.5 X 20 mm Synergy drug-eluting stent        Post dilatation using 3.75X12 and 4.0X12 mm [place] balloons up to 20 atm        Culprit lesion 95%--0% stenosis, TIMI flow I-->III, MSA 10.6 mm2        Loss of  one septal branch with mild burning sensation. No conduction abnormality noted, in fact rate is improved from 40 bpm to >60 bpm, not requiring any temporary pacing. Recommend overnight IV NTG. Elder Negus, MD Pager: [phone] Office: [phone]   ECHOCARDIOGRAM COMPLETE  Result Date: 11/15/2022    ECHOCARDIOGRAM REPORT   Patient Name:   BERNY AGAR Sr. Date of Exam: 11/15/2022 Medical Rec #:  [id number]             Height:       75.0 in Accession #:    [id number]            Weight:       210.0 lb Date of Birth:  1946/02/27             BSA:          2.240 m Patient Age:    8 years              BP:           102/85 mmHg Patient Gender: M                     HR:           71 bpm. Exam Location:  Inpatient Procedure: 2D Echo, Cardiac Doppler, Color Doppler and Strain Analysis Indications:    NSTEMI  History:        Patient has prior history of Echocardiogram examinations, most                 recent 11/06/2017. Cancer; Risk Factors:Hypertension,                 Dyslipidemia and Diabetes.  Sonographer:    Delcie Roch RDCS Referring Phys: [id number] Elmon Kirschner  Sonographer Comments: Global longitudinal strain was attempted. IMPRESSIONS  1. Septal, apical distal anterior, mid/distal inferior wall hypokinesis  . Left ventricular ejection fraction, by estimation, is 40 to 45%. The left ventricle has mildly decreased function. The left ventricle demonstrates regional wall motion abnormalities (see scoring diagram/findings for description). The left ventricular internal cavity size was mildly dilated. There is mild left ventricular hypertrophy. Left ventricular diastolic parameters are consistent with Grade I diastolic dysfunction (impaired relaxation).  2. Right ventricular systolic function is normal. The right ventricular size is normal. There is moderately elevated pulmonary artery systolic pressure.  3. Left atrial size was mildly dilated.  4. The mitral valve is normal in structure. Mild mitral valve regurgitation. No evidence of mitral stenosis.  5. The aortic valve is tricuspid. There is mild calcification of the aortic valve. Aortic valve regurgitation is mild. Aortic valve sclerosis is present, with no evidence of aortic valve stenosis.  6. The inferior vena cava is normal in size with greater than 50% respiratory variability, suggesting right atrial pressure of 3 mmHg. FINDINGS  Left Ventricle: Septal, apical distal anterior, mid/distal inferior wall hypokinesis. Left ventricular ejection fraction, by estimation, is 40 to 45%. The left ventricle has mildly decreased function. The left ventricle demonstrates regional wall motion  abnormalities. The left ventricular internal cavity size was mildly dilated. There is mild left ventricular hypertrophy. Left ventricular diastolic parameters are consistent with Grade I diastolic dysfunction (impaired relaxation). Right Ventricle: The right ventricular size is normal. No increase in right ventricular wall thickness. Right ventricular systolic function is normal. There is moderately elevated pulmonary artery systolic pressure. The tricuspid regurgitant velocity is 3.73 m/s, and with an assumed right atrial pressure of 3 mmHg, the  estimated right ventricular systolic  pressure is 58.7 mmHg. Left Atrium: Left atrial size was mildly dilated. Right Atrium: Right atrial size was normal in size. Pericardium: There is no evidence of pericardial effusion. Mitral Valve: The mitral valve is normal in structure. Mild mitral valve regurgitation. No evidence of mitral valve stenosis. Tricuspid Valve: The tricuspid valve is normal in structure. Tricuspid valve regurgitation is mild . No evidence of tricuspid stenosis. Aortic Valve: The aortic valve is tricuspid. There is mild calcification of the aortic valve. Aortic valve regurgitation is mild. Aortic valve sclerosis is present, with no evidence of aortic valve stenosis. Pulmonic Valve: The pulmonic valve was normal in structure. Pulmonic valve regurgitation is trivial. No evidence of pulmonic stenosis. Aorta: The aortic root is normal in size and structure. Venous: The inferior vena cava is normal in size with greater than 50% respiratory variability, suggesting right atrial pressure of 3 mmHg. IAS/Shunts: No atrial level shunt detected by color flow Doppler.  LEFT VENTRICLE PLAX 2D LVIDd:         5.40 cm   Diastology LVIDs:         3.40 cm   LV e' medial:    5.22 cm/s LV PW:         1.10 cm   LV E/e' medial:  16.0 LV IVS:        1.30 cm   LV e' lateral:   5.22 cm/s LVOT diam:     2.00 cm   LV E/e' lateral: 16.0 LV SV:         76 LV SV Index:   34 LVOT Area:     3.14 cm  RIGHT VENTRICLE             IVC RV Basal diam:  2.80 cm     IVC diam: 1.90 cm RV S prime:     11.70 cm/s TAPSE (M-mode): 1.9 cm LEFT ATRIUM             Index        RIGHT ATRIUM           Index LA diam:        3.90 cm 1.74 cm/m   RA Area:     14.00 cm LA Vol (A2C):   74.2 ml 33.12 ml/m  RA Volume:   33.50 ml  14.95 ml/m LA Vol (A4C):   68.6 ml 30.62 ml/m LA Biplane Vol: 73.8 ml 32.94 ml/m  AORTIC VALVE LVOT Vmax:   104.00 cm/s LVOT Vmean:  70.100 cm/s LVOT VTI:    0.242 m  AORTA Ao Root diam: 3.40 cm Ao Asc diam:  3.50 cm MITRAL VALVE               TRICUSPID VALVE MV  Area (PHT): 2.54 cm    TR Peak grad:   55.7 mmHg MV Decel Time: 299 msec    TR Vmax:        373.00 cm/s MV E velocity: 83.60 cm/s MV A velocity: 82.80 cm/s  SHUNTS MV E/A ratio:  1.01        Systemic VTI:  0.24 m                            Systemic Diam: 2.00 cm Charlton Haws MD Electronically signed by Charlton Haws MD Signature Date/Time: 11/15/2022/10:27:56 AM    Final    DG Chest 2 View  Result Date: 11/14/2022 CLINICAL DATA:  Chest pain,  shortness of breath EXAM: CHEST - 2 VIEW COMPARISON:  Chest radiograph 12/14/2020 FINDINGS: The heart is borderline enlarged. The upper mediastinal contours are normal, allowing for rightward patient rotation There are patchy opacities in the lung bases on the lateral projection. There is no other focal airspace opacity. There is no pulmonary edema. There is no pleural effusion or pneumothorax. There is no acute osseous abnormality. IMPRESSION: Patchy opacities in the lung bases on the lateral projection could reflect atelectasis or infection in the correct clinical setting. Electronically Signed   By: Lesia Hausen M.D.   On: 11/14/2022 19:47   Disposition   Pt is being discharged home today in good condition.  Follow-up Plans & Appointments     Follow-up Information     [facility]. Go to.   Specialty: Cardiology Why: Followup at the Heart Failure Clinic at [facility] Wednesday Nov 26, 2022 at 12:00 PM (Arrive by 11:45 AM to check in) Please bring list of current medications Free valet, Entrance C off [address] [employer] information: [address] [place] [address] [phone]        Tereso Newcomer T, PA-C Follow up.   Specialties: Cardiology, Physician Assistant Why: [facility] - [address] location - cardiology follow-up on Wednesday Dec 03, 2022 at 10:05 AM (Arrive by 9:50 AM). Contact information: [address] [place]  [address] [phone]                Discharge Instructions     Amb Referral to Cardiac Rehabilitation   Complete by: As directed    Diagnosis:  Coronary Stents NSTEMI PTCA     After initial evaluation and assessments completed: Virtual Based Care may be provided alone or in conjunction with Phase 2 Cardiac Rehab based on patient barriers.: Yes   Intensive Cardiac Rehabilitation (ICR) [facility] location only OR Traditional Cardiac Rehabilitation (TCR) *If criteria for ICR are not met will enroll in TCR [facility] only): Yes   Diet - low sodium heart healthy   Complete by: As directed    Discharge instructions   Complete by: As directed    You were started on 2 new blood thinners, aspirin and Effient/prasugrel. If you notice any bleeding such as blood in stool, black tarry stools, blood in urine, nosebleeds or any other unusual bleeding, call your doctor immediately. It is not normal to have this kind of bleeding while on a blood thinner and usually indicates there is an underlying problem with one of your body systems that needs to be checked out.   Your Exforge (amlodipine/valsartan) medicine was changed to losartan only. We may gradually adjust this at your follow-up visit.  You were given a prescription for as-needed nitroglycerin.  Tramadol was removed from your medicine list since you indicated you no longer take this.  The medical record system alerted for a significant drug interaction between colchicine (which you were taking for gout) and enzalutamide Diana Eves) - it can cause severe toxicity of the colchicine levels. We have discontinued colchicine. You should talk to the prescriber of this medicine about alternatives.   Increase activity slowly   Complete by: As directed    No driving for 1 week. No lifting over 10 lbs for 2 weeks. No sexual activity for 2 weeks. Keep procedure site clean & dry. If you notice increased pain, swelling, bleeding or pus, call/return!  You may shower, but  no soaking baths/hot tubs/pools for 1  week.        Discharge Medications   Allergies as of 11/19/2022       Reactions   Ezetimibe Other (See Comments)   Muscle aches   Statins Other (See Comments)   Joint pain        Medication List     STOP taking these medications    amLODipine-valsartan 5-320 MG tablet Commonly known as: EXFORGE   colchicine 0.6 MG tablet   traMADol 50 MG tablet Commonly known as: ULTRAM       TAKE these medications    acetaminophen 650 MG CR tablet Commonly known as: TYLENOL Take 650 mg by mouth every 8 (eight) hours as needed for pain.   allopurinol 300 MG tablet Commonly known as: ZYLOPRIM Take 300 mg by mouth daily.   aspirin EC 81 MG tablet Take 1 tablet (81 mg total) by mouth daily. Swallow whole.   CLEAR EYES OP Place 1 drop into both eyes daily as needed (dry eyes).   losartan 25 MG tablet Commonly known as: COZAAR Take 1 tablet (25 mg total) by mouth daily.   MULTIVITAMIN ADULT EXTRA C PO Take 1 tablet by mouth daily.   nitroGLYCERIN 0.4 MG SL tablet Commonly known as: NITROSTAT Place 1 tablet (0.4 mg total) under the tongue every 5 (five) minutes as needed for chest pain (up to 3 doses).   omeprazole 20 MG capsule Commonly known as: PRILOSEC Take 20 mg by mouth daily.   Orgovyx 120 MG tablet Generic drug: relugolix Take 120 mg by mouth daily.   prasugrel 10 MG Tabs tablet Commonly known as: EFFIENT Take 1 tablet (10 mg total) by mouth daily. Start taking on: November 20, 2022   Repatha SureClick 140 MG/ML Soaj Generic drug: Evolocumab Inject 140 mg into the skin every 14 (fourteen) days.   tolnaftate 1 % cream Commonly known as: TINACTIN Apply 1 application topically daily as needed (jock itch).   Xtandi 40 MG capsule Generic drug: enzalutamide Take 160 mg by mouth daily.           Outstanding Labs/Studies   Recommend BMET in follow-up  Duration of Discharge Encounter   Greater than 30  minutes including physician time.  Signed, Laurann Montana, PA-C 11/19/2022, 9:06 AM

## 2022-11-19 NOTE — Progress Notes (Signed)
Pharmacy sent up Effient 60 mg. Given to patient. Wheelchair provided. Patient to be escorted to [facility] pharmacy. Wife at beside to transport patient home.

## 2022-11-19 NOTE — Care Management Important Message (Signed)
Important Message  Patient Details  Name: Ricky MASE Sr. MRN: [id number] Date of Birth: 14-Jan-1946   Medicare Important Message Given:  Yes Patient left prior to IM delivery will mail to the patient home address.     [name] 11/19/2022, 3:09 PM

## 2022-11-19 NOTE — TOC Transition Note (Signed)
Transition of Care [facility]) - CM/SW Discharge Note   Patient Details  Name: Ricky BITER Sr. MRN: [id number] Date of Birth: 1945/11/30  Transition of [facility]) CM/SW Contact:  Leone Haven, RN Phone Number: 11/19/2022, 10:12 AM   Clinical Narrative:    Patient is for dc today, will be on effient, he has no needs. He has transport home.         Patient Goals and CMS Choice      Discharge Placement                         Discharge Plan and Services Additional resources added to the After Visit Summary for                                       Social Determinants of Health (SDOH) Interventions SDOH Screenings   Food Insecurity: No Food Insecurity (11/15/2022)  Housing: Low Risk  (11/18/2022)  Transportation Needs: No Transportation Needs (11/18/2022)  Utilities: Not At Risk (11/15/2022)  Alcohol Screen: Low Risk  (11/18/2022)  Depression (PHQ2-9): Low Risk  (05/26/2022)  Financial Resource Strain: Low Risk  (11/18/2022)  Tobacco Use: Medium Risk (11/15/2022)     Readmission Risk Interventions     No data to display

## 2022-11-19 NOTE — Progress Notes (Signed)
Contacted TOC pharmacy. Was informed that medications can be picked up in 25 minutes.

## 2022-11-19 NOTE — Telephone Encounter (Signed)
   Transition of Care Follow-up Phone Call Request    Patient Name: RHYDER PEVEY Sr. Date of Birth: March 13, 1945 Date of Encounter: 11/19/2022  Primary Care Provider:  [name], MD Primary Cardiologist:  Charlton Haws, MD  [name] is going to be discharged today. He has been scheduled for a transition of care follow up appointment with a [facility] provider:  12/03/22 with Tereso Newcomer PA-C  Also has 11/26/22 HF TOC visit scheduled  Please reach out to [employer] Sr. within 48 hours to confirm appointment and review transition of care protocol questionnaire.  Laurann Montana, PA-C  11/19/2022, 7:54 AM

## 2022-11-20 ENCOUNTER — Telehealth (HOSPITAL_COMMUNITY): Payer: Self-pay

## 2022-11-20 NOTE — Telephone Encounter (Signed)
Called patient to see if he is interested in the Cardiac Rehab Program. Patient expressed interest. Explained scheduling process and went over insurance, patient verbalized understanding. Will contact patient for scheduling once f/u has been completed.  °

## 2022-11-20 NOTE — Telephone Encounter (Signed)
Pt insurance is active and benefits verified through [facility] Medicare Co-pay 0, DED $225/$225 met, out of pocket $3,600/$325 met, co-insurance 0%. no pre-authorization required. Passport, 11/20/2022@2 :25, REF# [id number]   How many CR sessions are covered? (for ICR)72 Is this a lifetime maximum or an annual maximum? lifetime Has the member used any of these services to date? no Is there a time limit (weeks/months) on start of program and/or program completion? no     Will contact patient to see if he is interested in the Cardiac Rehab Program. If interested, patient will need to complete follow up appt. Once completed, patient will be contacted for scheduling upon review by the RN Navigator.

## 2022-11-20 NOTE — Telephone Encounter (Signed)
Patient contacted regarding discharge from [facility] on 11/19/22  Patient understands to follow up with Tereso Newcomer, PA-C on 12/03/22 at 10:05 am at [facility] office..  Patient understands discharge instructions? Yes  Patient understands medications and regiment? Yes Patient understands to bring all medications to this visit? Yes   Cath site clean dry intact.  Up and showered this am.  Aware of restrictions and will increase activity slowly. - walked to stop sign this am.  Feels "great".

## 2022-11-26 ENCOUNTER — Encounter (HOSPITAL_COMMUNITY): Payer: Self-pay

## 2022-11-26 ENCOUNTER — Ambulatory Visit (HOSPITAL_COMMUNITY)
Admit: 2022-11-26 | Discharge: 2022-11-26 | Disposition: A | Discharge status: Home / Self Care | Payer: Medicare Other | Attending: Cardiology | Admitting: Cardiology

## 2022-11-26 VITALS — BP 146/80 | HR 72 | Wt 207.2 lb

## 2022-11-26 DIAGNOSIS — N1832 Chronic kidney disease, stage 3b: Secondary | ICD-10-CM | POA: Diagnosis not present

## 2022-11-26 DIAGNOSIS — I5022 Chronic systolic (congestive) heart failure: Secondary | ICD-10-CM | POA: Insufficient documentation

## 2022-11-26 DIAGNOSIS — Z79899 Other long term (current) drug therapy: Secondary | ICD-10-CM | POA: Diagnosis not present

## 2022-11-26 DIAGNOSIS — I351 Nonrheumatic aortic (valve) insufficiency: Secondary | ICD-10-CM | POA: Diagnosis not present

## 2022-11-26 DIAGNOSIS — I2510 Atherosclerotic heart disease of native coronary artery without angina pectoris: Secondary | ICD-10-CM | POA: Diagnosis not present

## 2022-11-26 DIAGNOSIS — R001 Bradycardia, unspecified: Secondary | ICD-10-CM | POA: Insufficient documentation

## 2022-11-26 DIAGNOSIS — I739 Peripheral vascular disease, unspecified: Secondary | ICD-10-CM | POA: Diagnosis not present

## 2022-11-26 DIAGNOSIS — D649 Anemia, unspecified: Secondary | ICD-10-CM | POA: Diagnosis not present

## 2022-11-26 DIAGNOSIS — I130 Hypertensive heart and chronic kidney disease with heart failure and stage 1 through stage 4 chronic kidney disease, or unspecified chronic kidney disease: Secondary | ICD-10-CM | POA: Insufficient documentation

## 2022-11-26 DIAGNOSIS — I255 Ischemic cardiomyopathy: Secondary | ICD-10-CM | POA: Diagnosis not present

## 2022-11-26 DIAGNOSIS — Z87891 Personal history of nicotine dependence: Secondary | ICD-10-CM | POA: Insufficient documentation

## 2022-11-26 DIAGNOSIS — D472 Monoclonal gammopathy: Secondary | ICD-10-CM

## 2022-11-26 DIAGNOSIS — Z9861 Coronary angioplasty status: Secondary | ICD-10-CM | POA: Diagnosis not present

## 2022-11-26 DIAGNOSIS — C9000 Multiple myeloma not having achieved remission: Secondary | ICD-10-CM | POA: Insufficient documentation

## 2022-11-26 DIAGNOSIS — I252 Old myocardial infarction: Secondary | ICD-10-CM | POA: Diagnosis not present

## 2022-11-26 DIAGNOSIS — C61 Malignant neoplasm of prostate: Secondary | ICD-10-CM | POA: Diagnosis not present

## 2022-11-26 LAB — COMPREHENSIVE METABOLIC PANEL WITH GFR
ALT: 18 U/L (ref 0–44)
AST: 22 U/L (ref 15–41)
Albumin: 3.7 g/dL (ref 3.5–5.0)
Alkaline Phosphatase: 79 U/L (ref 38–126)
Anion gap: 10 (ref 5–15)
BUN: 46 mg/dL — ABNORMAL HIGH (ref 8–23)
CO2: 21 mmol/L — ABNORMAL LOW (ref 22–32)
Calcium: 9.9 mg/dL (ref 8.9–10.3)
Chloride: 101 mmol/L (ref 98–111)
Creatinine, Ser: 2.1 mg/dL — ABNORMAL HIGH (ref 0.61–1.24)
GFR, Estimated: 31 mL/min — ABNORMAL LOW (ref >60)
Glucose, Bld: 126 mg/dL — ABNORMAL HIGH (ref 70–99)
Potassium: 5.3 mmol/L — ABNORMAL HIGH (ref 3.5–5.1)
Sodium: 132 mmol/L — ABNORMAL LOW (ref 135–145)
Total Bilirubin: 0.9 mg/dL (ref 0.3–1.2)
Total Protein: 7.9 g/dL (ref 6.5–8.1)

## 2022-11-26 LAB — CBC
HCT: 27.1 % — ABNORMAL LOW (ref 39.0–52.0)
Hemoglobin: 9 g/dL — ABNORMAL LOW (ref 13.0–17.0)
MCH: 32.4 pg (ref 26.0–34.0)
MCHC: 33.2 g/dL (ref 30.0–36.0)
MCV: 97.5 fL (ref 80.0–100.0)
Platelets: 284 10*3/uL (ref 150–400)
RBC: 2.78 MIL/uL — ABNORMAL LOW (ref 4.22–5.81)
RDW: 12.9 % (ref 11.5–15.5)
WBC: 8.3 10*3/uL (ref 4.0–10.5)
nRBC: 0 % (ref 0.0–0.2)

## 2022-11-26 LAB — IRON AND TIBC
Iron: 71 ug/dL (ref 45–182)
Saturation Ratios: 15 % — ABNORMAL LOW (ref 17.9–39.5)
TIBC: 487 ug/dL — ABNORMAL HIGH (ref 250–450)
UIBC: 416 ug/dL

## 2022-11-26 LAB — FERRITIN: Ferritin: 66 ng/mL (ref 24–336)

## 2022-11-26 LAB — BRAIN NATRIURETIC PEPTIDE: B Natriuretic Peptide: 205.5 pg/mL — ABNORMAL HIGH (ref 0.0–100.0)

## 2022-11-26 NOTE — Progress Notes (Signed)
ReDS Vest / Clip - 11/26/22 1200       ReDS Vest / Clip   Station Marker C    Ruler Value 29    ReDS Value Range Low volume    ReDS Actual Value 31

## 2022-11-26 NOTE — Progress Notes (Signed)
HEART & VASCULAR TRANSITION OF CARE CONSULT NOTE     Referring Physician: Dr. Eden Emms  Primary Care: [name], MD Primary Cardiologist: Charlton Haws, MD  HPI: Referred to clinic by Dr. Eden Emms, Cardiology, for heart failure consultation.   77 y/o AAM w/ h/o monoclonal gammopathy of undetermined significance and has been under observation for several years. Followed by Dr. Arbutus Ped. Had recent repeat myeloma panel that showed further increase in the free kappa light chain concerning for disease progression. Subsequently referred for repeat bone marrow biopsy 5/24 which showed only 7% plasma cells>>plan is to continue on observation with repeat myeloma panel in 6 months. Also w/ h/o prostate cancer, status post curative radiotherapy. Followed by urology.    He was evaluated for unexplained syncope in 2019. Echo then showed normal LVEF 50-55%, mild LVH. No significant valvular abnormalties. Carotid dopplers 1-39% rt ICA stenosis, 40-59% lt ICA stenosis. 30 day monitor unremarkable. Was referred to EP and underwent ILR for further surveillance. This had been followed by Dr. Graciela Husbands. From what can be gathered from EP notes, it appears that no high grade arrhythmias were observed during surveillance period. IRL was explanted 11/23.  Also w/ h/o spinal stenosis, s/p lumbar spine surgery, unilateral hearing loss and CKD IIIb (most recent b/l SCr 1.7).    Pt was recently admitted 9/24 w/ CP and ruled in for NSTEMI and found to have new systolic heart failure, EF down to 40-45%, RV normal. Hospital course also notable for profound bradycardia and PACs, requiring TVP at time of LHC. Cardiac cath demonstrated 95% prox-mid LAD (culprit lesion) + CTO of the prox RCA filled by left>>right collaterals as well as 20% LM, 30% mid LAD, 50% prox LCx and 50% OM1.  He underwent successful PCI of the LAD. It was noted that conduction/bradycardia improved post revascularization. TVP wire removed. No further  bradycardia was observed but he was discharged home w/ a 30 day monitor for further surveillance. Placed on GDMT for both CAD and HF and referred to [facility] clinic at d/c.   He presents today for assessment. Here w/ his wife. Denies any further CP but c/w exertional fatigue and exertional dyspnea, NYHA Class II-early III. Wt has been down-trending at home. Down ~5 lb since discharge. No LEE, orthopnea or PND. ReDs 31%. Reports full med compliance. Reports dark stools. No longer taking a daily PPI. Was previously on Prilosec.  Of note, he has anemia w/ gradual decline in Hgb over the last several months. Hgb was 11.8 3 months ago and was down to 8.5 day of recent d/c. He does not take a daily iron supplement. He has f/u w/ his PCP later this week on Friday.   EKG today shows NSR, 71 bpm. Denies syncope but occasionally feels dizzy w/ prolonged periods of standing/ walking. BP mildly elevated in clinic but reports h/o "white coat HTH". He has been monitoring BP daily at home and systolic's have been in the low 100s, no higher than 110.   He also reports extensive FH of HF, in both mother and father + 3 brothers and 2 sisters all w/ HF.     Cardiac Testing    2D Echo 11/15/22 1. Septal, apical distal anterior, mid/distal inferior wall hypokinesis .  Left ventricular ejection fraction, by estimation, is 40 to 45%. The left  ventricle has mildly decreased function. The left ventricle demonstrates  regional wall motion  abnormalities (see scoring diagram/findings for description). The left  ventricular internal cavity size was  mildly dilated. There is mild left  ventricular hypertrophy. Left ventricular diastolic parameters are  consistent with Grade I diastolic  dysfunction (impaired relaxation).   2. Right ventricular systolic function is normal. The right ventricular  size is normal. There is moderately elevated pulmonary artery systolic  pressure.   3. Left atrial size was mildly dilated.   4. The  mitral valve is normal in structure. Mild mitral valve  regurgitation. No evidence of mitral stenosis.   5. The aortic valve is tricuspid. There is mild calcification of the  aortic valve. Aortic valve regurgitation is mild. Aortic valve sclerosis  is present, with no evidence of aortic valve stenosis.   6. The inferior vena cava is normal in size with greater than 50%  respiratory variability, suggesting right atrial pressure of 3 mmHg.    Coronary intervention 11/16/2022: LM: 20% disease LAD: Prox-mid 95% calcific stenosis (culprit lesion)          Mid 30% disease Lcx: Prox 50% stenosis        OM1 50% stenosis RCA: Prox CTO. Left-to-right collaterals     Temporary transvenous pacemaker placement (removed at the end of the case) Intravascular ultrasound (IVUS) Successful percutaneous coronary intervention mid LAD        PTCA and stent placement 3.5 X 20 mm Synergy drug-eluting stent        Post dilatation using 3.75X12 and 4.0X12 mm Bracey balloons up to 20 atm        Culprit lesion 95%--0% stenosis, TIMI flow I-->III, MSA 10.6 mm2        Loss of one septal branch with mild burning sensation. No conduction abnormality noted, in fact rate is improved from 40 bpm to >60 bpm, not requiring any temporary pacing. Recommend overnight IV NTG.    Bone Marrow Aspiratory Pathology 07/2022 CELL COUNT DATA:   Bone Marrow count performed on 500 cells shows:  Blasts:   0%   Myeloid:  40%  Promyelocytes: 0%   Erythroid:     42%  Myelocytes:    7%   Lymphocytes:   10%  Metamyelocytes:     6%   Plasma cells:  7%  Bands:    5%  Neutrophils:   20%  M:E ratio:     0.95  Eosinophils:   2%  Basophils:     0%  Monocytes:     1%   Lab Data: CBC performed on 07/31/2022 shows:  WBC: 6.8 k/uL  Neutrophils:   73%  Hgb: 11.8 g/dL Lymphocytes:   72%  HCT: 36.1 %    Monocytes:     12%  MCV: 98.9 fL   Eosinophils:   0%  RDW: 13.9 %    Basophils:     1%  PLT: 168 k/uL    Review of Systems: [y] = yes, [ ]  =  no   General: Weight gain [ ] ; Weight loss [ ] ; Anorexia [ ] ; Fatigue [ ] ; Fever [ ] ; Chills [ ] ; Weakness [ ]   Cardiac: Chest pain/pressure [ ] ; Resting SOB [ ] ; Exertional SOB [ ] ; Orthopnea [ ] ; Pedal Edema [ ] ; Palpitations [ ] ; Syncope [ ] ; Presyncope [ ] ; Paroxysmal nocturnal dyspnea[ ]   Pulmonary: Cough [ ] ; Wheezing[ ] ; Hemoptysis[ ] ; Sputum [ ] ; Snoring [ ]   GI: Vomiting[ ] ; Dysphagia[ ] ; Melena[ ] ; Hematochezia [ ] ; Heartburn[ ] ; Abdominal pain [ ] ; Constipation [ ] ; Diarrhea [ ] ; BRBPR [ ]   GU: Hematuria[ ] ; Dysuria [ ] ; Nocturia[ ]   Vascular: Pain in legs with walking [ ] ; Pain in feet with lying flat [ ] ; Non-healing sores [ ] ; Stroke [ ] ; TIA [ ] ; Slurred speech [ ] ;  Neuro: Headaches[ ] ; Vertigo[ ] ; Seizures[ ] ; Paresthesias[ ] ;Blurred vision [ ] ; Diplopia [ ] ; Vision changes [ ]   Ortho/Skin: Arthritis [ ] ; Joint pain [ ] ; Muscle pain [ ] ; Joint swelling [ ] ; Back Pain [ ] ; Rash [ ]   Psych: Depression[ ] ; Anxiety[ ]   Heme: Bleeding problems [ ] ; Clotting disorders [ ] ; Anemia [ ]   Endocrine: Diabetes [ ] ; Thyroid dysfunction[ ]    Past Medical History:  Diagnosis Date   Borderline diabetes    DIET CONTROLLED   Dyslipidemia    Elevated PSA    External carotid artery stenosis    MILD-- BILATERAL   Fall 11/05/2017   GERD (gastroesophageal reflux disease)    Gout, arthritis    Hearing loss in left ear    Heart murmur    MILD --  ASYMPTOMATIC   History of gastric ulcer    REMOTE HX   Hypertension    Pre-diabetes    Prostate cancer (HCC) 12/21/2012   Gleason 3+4=7   Seizure-like activity (HCC)    Syncope and collapse    Wears glasses     Current Outpatient Medications  Medication Sig Dispense Refill   acetaminophen (TYLENOL) 650 MG CR tablet Take 650 mg by mouth every 8 (eight) hours as needed for pain.     allopurinol (ZYLOPRIM) 300 MG tablet Take 300 mg by mouth daily.      aspirin EC 81 MG tablet Take 1 tablet (81 mg total) by mouth daily. Swallow whole. 120  tablet 2   losartan (COZAAR) 25 MG tablet Take 1 tablet (25 mg total) by mouth daily. 30 tablet 5   Multiple Vitamins-Minerals (MULTIVITAMIN ADULT EXTRA C PO) Take 1 tablet by mouth daily.     Naphazoline HCl (CLEAR EYES OP) Place 1 drop into both eyes daily as needed (dry eyes).      nitroGLYCERIN (NITROSTAT) 0.4 MG SL tablet Place 1 tablet (0.4 mg total) under the tongue every 5 (five) minutes as needed for chest pain (up to 3 doses). 25 tablet 3   omeprazole (PRILOSEC) 20 MG capsule Take 20 mg by mouth daily.     ORGOVYX 120 MG tablet Take 120 mg by mouth daily.     prasugrel (EFFIENT) 10 MG TABS tablet Take 1 tablet (10 mg total) by mouth daily. 30 tablet 11   REPATHA SURECLICK 140 MG/ML SOAJ Inject 140 mg into the skin every 14 (fourteen) days.      tolnaftate (TINACTIN) 1 % cream Apply 1 application topically daily as needed (jock itch).     XTANDI 40 MG capsule Take 160 mg by mouth daily.     No current facility-administered medications for this visit.    Allergies  Allergen Reactions   Ezetimibe Other (See Comments)    Muscle aches   Statins Other (See Comments)    Joint pain      Social History   Socioeconomic History   Marital status: Married    Spouse name: geraldine   Number of children: 3   Years of education: some college   Highest education level: High school graduate  Occupational History   Occupation: retired  Tobacco Use   Smoking status: Former    Current packs/day: 0.00    Average packs/day: 1 pack/day for 30.0 years (30.0 ttl pk-yrs)  Types: Cigarettes    Start date: 12/16/1967    Quit date: 12/15/1997    Years since quitting: 24.9   Smokeless tobacco: Never  Vaping Use   Vaping status: Never Used  Substance and Sexual Activity   Alcohol use: Not Currently    Alcohol/week: 20.0 standard drinks of alcohol    Types: 20 Shots of liquor per week    Comment: OCCASIONAL, 12/28/17 stopped 11/05/17   Drug use: No   Sexual activity: Not on file  Other  Topics Concern   Not on file  Social History Narrative   Lives with wife at home    caffeine use- occas, mostly decaf coffee   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    Social Determinants of Health   Financial Resource Strain: Low Risk  (11/18/2022)   Overall Financial Resource Strain (CARDIA)    Difficulty of Paying Living Expenses: Not hard at all  Food Insecurity: No Food Insecurity (11/15/2022)   Hunger Vital Sign    Worried About Running Out of Food in the Last Year: Never true    Ran Out of Food in the Last Year: Never true  Transportation Needs: No Transportation Needs (11/18/2022)   PRAPARE - Administrator, Civil Service (Medical): No    Lack of Transportation (Non-Medical): No  Physical Activity: Not on file  Stress: Not on file  Social Connections: Not on file  Intimate Partner Violence: Not At Risk (11/15/2022)   Humiliation, Afraid, Rape, and Kick questionnaire    Fear of Current or Ex-Partner: No    Emotionally Abused: No    Physically Abused: No    Sexually Abused: No      Family History  Problem Relation Age of Onset   Heart disease Mother    Heart disease Father    Cancer Sister        lung   Cancer Brother        prostate    There were no vitals filed for this visit.  PHYSICAL EXAM: General:  Well appearing. No respiratory difficulty HEENT: normal Neck: supple. no JVD. Carotids 2+ bilat; no bruits. No lymphadenopathy or thryomegaly appreciated. Cor: PMI nondisplaced. Regular rate & rhythm. No rubs, gallops or murmurs. Lungs: clear Abdomen: soft, nontender, nondistended. No hepatosplenomegaly. No bruits or masses. Good bowel sounds. Extremities: no cyanosis, clubbing, rash, edema Neuro: alert & oriented x 3, cranial nerves grossly intact. moves all 4 extremities w/o difficulty. Affect pleasant.  ECG: NSR 74 bpm    ASSESSMENT & PLAN:  1. 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 RV ok  - Echo 9/24 EF 40-45%, RV normal - LHC 9/24 w/ MVCAD including high grade prox-mid LAD stenosis (95%) treated w/ PCI. Suspect coronary ischemia is major contributor to HF but given MUGS, + family history of HF in multiple 1st degree relatives (mother, father 3 brothers + 2 sisters), h/o syncope requiring surveillance w/ ILR, bradycardia, CKD IIIb and spinal stenosis, will need to also rule out cardiac amyloid. Plan cMRI (as long as renal fx is ok). If prohibitive, will start w/ PYP scan.  - Hem/Onc following multiple myeloma and SPEP labs (scheduled for repeat ~11/24) - recommend genetic testing given strong FH (forgot to include in today's labs, will send off next visit) - NYHA Class III. Euvolemic on exam and by ReDs 31%. Does not need daily loop diuretic - continue Losartan 25 mg daily. Will hold  off on transition to [facility] for now given soft BP at home and occasional positional dizziness  - hold off on spiro for now given b/l SCr - no ? blocker w/ recent profound bradycardia  - hold off on SGLT2i for now given risk for volume depletion. Can re-visit at next f/u   2. CAD  - s/p NSTEMI 9/24. LHC: 95% prox-mid LAD (culprit lesion) + CTO of the prox RCA filled by left>>right collaterals as well as 20% LM, 30% mid LAD, 50% prox LCx and 50% OM1.  He underwent successful PCI of the LAD. - stable w/o CP  - on DAPT w/ ASA + Effient (unable to use Brilinta given drug interaction w/ Xtandi) - statin intolerant. On Repatha - no ? blocker given h/o bradycardia   3. MGUS  - has been under observation for several years. Followed by Dr. Arbutus Ped. Had recent repeat myeloma panel that showed further increase in the free kappa light chain concerning for disease progression. Subsequently referred for repeat bone marrow biopsy 5/24 which showed only 7% plasma cells - plan is to continue on observation with repeat myeloma panel in 6 months - will plan cardiac imaging to assess for potential amyloid    4. Prostate Cancer - status post curative radiotherapy - on Orgovyx and Xtandi  - Followed by urology  5. Bradycardia - required TVP at time of LHC for NSTEMI. Felt to be ischemically mediated, as bradycardia resolved after revascularization  - EKG today shows NSR, HR 70s. Denies syncope/ near syncope - cont to avoid AV nodal blocking agents for now - Given this + prior h/o unexplained syncope requiring ILR surveillance, will plan w/u to exclude infiltrative process per above   5. Anemia - suspect contributing to fatigue. Progressive decline in Hgb over last 3 months, from 11.8>>8.4. Worry about potential GIB. Reports prior h/o this + recent dark stools concerning for possible melena. He is on DAPT w/ ASA + Effient, which he will need to continue given fresh LAD stent. He has been off of his PPI (self discontinued Prilosec) - will check CBC, Iron, TIBC and Ferritin levels today. May need blood transfusion  - have instructed him to restart his PPI for GI protection  - encouraged to keep f/u w/ his PCP in 2 days, will likely need to start PO iron supplementation  6. PVD - Carotid dopplers in 2019 demonstrated 1-39% rt ICA stenosis, 40-59% lt ICA stenosis - will need repeat assessment. Cardiology to follow up - on DAPT + Repatha     Referred to HFSW (PCP, Medications, Transportation, ETOH Abuse, Drug Abuse, Insurance, Financial ): No Refer to Pharmacy: No Refer to Home Health:  No Refer to Advanced Heart Failure Clinic: Yes Refer to General Cardiology: Yes (shared care w/ Dr. Eden Emms) Has f/u w/ APP next wk, 9/25  Follow up  in the [facility] for further management and w/u of HF. Will plan shared care w/ cardiology. They will continue to follow for CAD and PVD.   Robbie Lis, PA-C 11/26/2022

## 2022-11-26 NOTE — Patient Instructions (Addendum)
RedsClip done today.  EKG done today.   Labs done today. We will contact you only if your labs are abnormal.  No medication changes were made. Please continue all current medications as prescribed.  Your physician recommends that you schedule a follow-up appointment in: 3 weeks  If you have any questions or concerns before your next appointment please send [place] a message through [place]wen or call our office at [phone].    TO LEAVE A MESSAGE FOR THE NURSE SELECT OPTION 2, PLEASE LEAVE A MESSAGE INCLUDING: YOUR NAME DATE OF BIRTH CALL BACK NUMBER REASON FOR CALL**this is important as we prioritize the call backs  YOU WILL RECEIVE A CALL BACK THE SAME DAY AS LONG AS YOU CALL BEFORE 4:00 PM   Do the following things EVERYDAY: Weigh yourself in the morning before breakfast. Write it down and keep it in a log. Take your medicines as prescribed Eat low salt foods--Limit salt (sodium) to 2000 mg per day.  Stay as active as you can everyday Limit all fluids for the day to less than 2 liters   At the [facility], you and your health needs are our priority. As part of our continuing mission to provide you with exceptional heart care, we have created designated Provider Care Teams. These Care Teams include your primary Cardiologist (physician) and Advanced Practice Providers (APPs- Physician Assistants and Nurse Practitioners) who all work together to provide you with the care you need, when you need it.   You may see any of the following providers on your designated Care Team at your next follow up: Dr Arvilla Meres Dr Marca Ancona Dr. Marcos Eke, NP Robbie Lis, [place] [facility] [place], [place] Brynda Peon, NP Karle Plumber, PharmD   Please be sure to bring in all your medications bottles to every appointment.    Thank you for choosing [facility]

## 2022-11-28 ENCOUNTER — Telehealth: Payer: Self-pay | Admitting: Cardiovascular Disease

## 2022-11-28 NOTE — Telephone Encounter (Signed)
Called patient back about message. Patient stated that his PCP took him off of Losartan due to his creatinine. Reviewed lab work and saw that [employer], PA took patient off of his Losartan due to his elevated K and his kidney function. Patient will keep an eye on his BP over the weekend. Patient has follow-up with Tereso Newcomer PA on Wednesday. Will send message to Dr. Eden Emms so he is aware. Patient stated Dr. Eden Emms told him to tell him if anyone ever changes is medications.

## 2022-11-28 NOTE — Telephone Encounter (Signed)
Pt c/o medication issue:  1. Name of Medication: losartan (COZAAR) 25 MG tablet   2. How are you currently taking this medication (dosage and times per day)?    Take 1 tablet (25 mg total) by mouth daily.    3. Are you having a reaction (difficulty breathing--STAT)?   4. What is your medication issue? Patient called stating his PCP called him today to take him off of the medication because creatinine level is higher.  Patient is calling to find out if that is okay to stop taking it.  Please advise.

## 2022-12-01 ENCOUNTER — Other Ambulatory Visit: Payer: Self-pay

## 2022-12-01 ENCOUNTER — Telehealth: Payer: Self-pay | Admitting: Cardiovascular Disease

## 2022-12-01 ENCOUNTER — Encounter (HOSPITAL_COMMUNITY): Payer: Self-pay | Admitting: Emergency Medicine

## 2022-12-01 ENCOUNTER — Emergency Department (HOSPITAL_COMMUNITY): Payer: Medicare Other

## 2022-12-01 ENCOUNTER — Inpatient Hospital Stay (HOSPITAL_COMMUNITY)
Admission: EM | Admit: 2022-12-01 | Discharge: 2022-12-05 | DRG: 378 | Disposition: A | Discharge status: Home / Self Care | Payer: Medicare Other | Attending: Internal Medicine | Admitting: Internal Medicine

## 2022-12-01 DIAGNOSIS — H9192 Unspecified hearing loss, left ear: Secondary | ICD-10-CM | POA: Diagnosis present

## 2022-12-01 DIAGNOSIS — W19XXXA Unspecified fall, initial encounter: Secondary | ICD-10-CM | POA: Diagnosis present

## 2022-12-01 DIAGNOSIS — Z7902 Long term (current) use of antithrombotics/antiplatelets: Secondary | ICD-10-CM

## 2022-12-01 DIAGNOSIS — I25118 Atherosclerotic heart disease of native coronary artery with other forms of angina pectoris: Secondary | ICD-10-CM

## 2022-12-01 DIAGNOSIS — Z7982 Long term (current) use of aspirin: Secondary | ICD-10-CM

## 2022-12-01 DIAGNOSIS — Z8249 Family history of ischemic heart disease and other diseases of the circulatory system: Secondary | ICD-10-CM | POA: Diagnosis not present

## 2022-12-01 DIAGNOSIS — M109 Gout, unspecified: Secondary | ICD-10-CM | POA: Diagnosis present

## 2022-12-01 DIAGNOSIS — I5022 Chronic systolic (congestive) heart failure: Secondary | ICD-10-CM

## 2022-12-01 DIAGNOSIS — I130 Hypertensive heart and chronic kidney disease with heart failure and stage 1 through stage 4 chronic kidney disease, or unspecified chronic kidney disease: Secondary | ICD-10-CM | POA: Diagnosis present

## 2022-12-01 DIAGNOSIS — I2510 Atherosclerotic heart disease of native coronary artery without angina pectoris: Secondary | ICD-10-CM | POA: Diagnosis present

## 2022-12-01 DIAGNOSIS — D62 Acute posthemorrhagic anemia: Secondary | ICD-10-CM | POA: Diagnosis present

## 2022-12-01 DIAGNOSIS — K2970 Gastritis, unspecified, without bleeding: Secondary | ICD-10-CM | POA: Diagnosis present

## 2022-12-01 DIAGNOSIS — D649 Anemia, unspecified: Secondary | ICD-10-CM | POA: Diagnosis not present

## 2022-12-01 DIAGNOSIS — M1990 Unspecified osteoarthritis, unspecified site: Secondary | ICD-10-CM | POA: Diagnosis present

## 2022-12-01 DIAGNOSIS — N1832 Chronic kidney disease, stage 3b: Secondary | ICD-10-CM | POA: Diagnosis present

## 2022-12-01 DIAGNOSIS — K922 Gastrointestinal hemorrhage, unspecified: Secondary | ICD-10-CM | POA: Diagnosis present

## 2022-12-01 DIAGNOSIS — Z1152 Encounter for screening for COVID-19: Secondary | ICD-10-CM | POA: Diagnosis not present

## 2022-12-01 DIAGNOSIS — E785 Hyperlipidemia, unspecified: Secondary | ICD-10-CM | POA: Diagnosis present

## 2022-12-01 DIAGNOSIS — Z888 Allergy status to other drugs, medicaments and biological substances status: Secondary | ICD-10-CM | POA: Diagnosis not present

## 2022-12-01 DIAGNOSIS — Z23 Encounter for immunization: Secondary | ICD-10-CM | POA: Diagnosis present

## 2022-12-01 DIAGNOSIS — K219 Gastro-esophageal reflux disease without esophagitis: Secondary | ICD-10-CM | POA: Diagnosis present

## 2022-12-01 DIAGNOSIS — K921 Melena: Principal | ICD-10-CM

## 2022-12-01 DIAGNOSIS — I255 Ischemic cardiomyopathy: Secondary | ICD-10-CM | POA: Diagnosis present

## 2022-12-01 DIAGNOSIS — E860 Dehydration: Secondary | ICD-10-CM | POA: Diagnosis present

## 2022-12-01 DIAGNOSIS — S0101XA Laceration without foreign body of scalp, initial encounter: Secondary | ICD-10-CM | POA: Diagnosis present

## 2022-12-01 DIAGNOSIS — N179 Acute kidney failure, unspecified: Secondary | ICD-10-CM | POA: Diagnosis present

## 2022-12-01 DIAGNOSIS — R55 Syncope and collapse: Secondary | ICD-10-CM | POA: Diagnosis not present

## 2022-12-01 DIAGNOSIS — R0602 Shortness of breath: Secondary | ICD-10-CM

## 2022-12-01 DIAGNOSIS — Z87891 Personal history of nicotine dependence: Secondary | ICD-10-CM | POA: Diagnosis not present

## 2022-12-01 DIAGNOSIS — Z955 Presence of coronary angioplasty implant and graft: Secondary | ICD-10-CM

## 2022-12-01 DIAGNOSIS — I110 Hypertensive heart disease with heart failure: Secondary | ICD-10-CM | POA: Diagnosis not present

## 2022-12-01 DIAGNOSIS — D472 Monoclonal gammopathy: Secondary | ICD-10-CM | POA: Diagnosis present

## 2022-12-01 DIAGNOSIS — Z8546 Personal history of malignant neoplasm of prostate: Secondary | ICD-10-CM

## 2022-12-01 DIAGNOSIS — Z79899 Other long term (current) drug therapy: Secondary | ICD-10-CM | POA: Diagnosis not present

## 2022-12-01 DIAGNOSIS — S0990XA Unspecified injury of head, initial encounter: Secondary | ICD-10-CM

## 2022-12-01 LAB — CBC WITH DIFFERENTIAL/PLATELET
Abs Immature Granulocytes: 0.05 10*3/uL (ref 0.00–0.07)
Basophils Absolute: 0 10*3/uL (ref 0.0–0.1)
Basophils Relative: 0 %
Eosinophils Absolute: 0 10*3/uL (ref 0.0–0.5)
Eosinophils Relative: 0 %
HCT: 23.3 % — ABNORMAL LOW (ref 39.0–52.0)
Hemoglobin: 7.5 g/dL — ABNORMAL LOW (ref 13.0–17.0)
Immature Granulocytes: 1 %
Lymphocytes Relative: 16 %
Lymphs Abs: 1.4 10*3/uL (ref 0.7–4.0)
MCH: 31.5 pg (ref 26.0–34.0)
MCHC: 32.2 g/dL (ref 30.0–36.0)
MCV: 97.9 fL (ref 80.0–100.0)
Monocytes Absolute: 0.6 10*3/uL (ref 0.1–1.0)
Monocytes Relative: 7 %
Neutro Abs: 6.2 10*3/uL (ref 1.7–7.7)
Neutrophils Relative %: 76 %
Platelets: 292 10*3/uL (ref 150–400)
RBC: 2.38 MIL/uL — ABNORMAL LOW (ref 4.22–5.81)
RDW: 13.1 % (ref 11.5–15.5)
WBC: 8.2 10*3/uL (ref 4.0–10.5)
nRBC: 0 % (ref 0.0–0.2)

## 2022-12-01 LAB — BASIC METABOLIC PANEL
Anion gap: 13 (ref 5–15)
BUN: 43 mg/dL — ABNORMAL HIGH (ref 8–23)
CO2: 20 mmol/L — ABNORMAL LOW (ref 22–32)
Calcium: 10.2 mg/dL (ref 8.9–10.3)
Chloride: 103 mmol/L (ref 98–111)
Creatinine, Ser: 2.23 mg/dL — ABNORMAL HIGH (ref 0.61–1.24)
GFR, Estimated: 29 mL/min — ABNORMAL LOW (ref >60)
Glucose, Bld: 149 mg/dL — ABNORMAL HIGH (ref 70–99)
Potassium: 5 mmol/L (ref 3.5–5.1)
Sodium: 136 mmol/L (ref 135–145)

## 2022-12-01 LAB — D-DIMER, QUANTITATIVE: D-Dimer, Quant: 1.82 ug/mL-FEU — ABNORMAL HIGH (ref 0.00–0.50)

## 2022-12-01 LAB — TROPONIN I (HIGH SENSITIVITY)
Troponin I (High Sensitivity): 27 ng/L — ABNORMAL HIGH (ref <18)
Troponin I (High Sensitivity): 29 ng/L — ABNORMAL HIGH (ref <18)

## 2022-12-01 LAB — RESP PANEL BY RT-PCR (RSV, FLU A&B, COVID)  RVPGX2
Influenza A by PCR: NEGATIVE
Influenza B by PCR: NEGATIVE
Resp Syncytial Virus by PCR: NEGATIVE
SARS Coronavirus 2 by RT PCR: NEGATIVE

## 2022-12-01 LAB — PREPARE RBC (CROSSMATCH)

## 2022-12-01 LAB — ABO/RH: ABO/RH(D): A POS

## 2022-12-01 MED ORDER — SODIUM CHLORIDE 0.9 % IV SOLN: INTRAVENOUS | Status: DC

## 2022-12-01 MED ORDER — TRAZODONE HCL 50 MG PO TABS
50.0000 mg | ORAL_TABLET | Freq: Every evening | ORAL | Status: DC | PRN
  Administered 2022-12-01 – 2022-12-04 (×4): 50 mg via ORAL
  Filled 2022-12-01 (×4): qty 1

## 2022-12-01 MED ORDER — SENNOSIDES-DOCUSATE SODIUM 8.6-50 MG PO TABS
1.0000 | ORAL_TABLET | Freq: Every evening | ORAL | Status: DC | PRN
  Administered 2022-12-02: 1 via ORAL
  Filled 2022-12-01: qty 1

## 2022-12-01 MED ORDER — SODIUM CHLORIDE 0.9% IV SOLUTION: Freq: Once | INTRAVENOUS | Status: AC

## 2022-12-01 MED ORDER — ASPIRIN 81 MG PO TBEC
81.0000 mg | DELAYED_RELEASE_TABLET | Freq: Every day | ORAL | Status: DC
  Administered 2022-12-01 – 2022-12-05 (×5): 81 mg via ORAL
  Filled 2022-12-01 (×5): qty 1

## 2022-12-01 MED ORDER — METOPROLOL TARTRATE 5 MG/5ML IV SOLN: 5.0000 mg | INTRAVENOUS | Status: DC | PRN

## 2022-12-01 MED ORDER — LIDOCAINE 5 % EX PTCH
1.0000 | MEDICATED_PATCH | CUTANEOUS | Status: DC
  Administered 2022-12-01 – 2022-12-04 (×4): 1 via TRANSDERMAL
  Filled 2022-12-01 (×4): qty 1

## 2022-12-01 MED ORDER — IPRATROPIUM-ALBUTEROL 0.5-2.5 (3) MG/3ML IN SOLN: 3.0000 mL | RESPIRATORY_TRACT | Status: DC | PRN

## 2022-12-01 MED ORDER — HYDRALAZINE HCL 20 MG/ML IJ SOLN: 10.0000 mg | INTRAMUSCULAR | Status: DC | PRN

## 2022-12-01 MED ORDER — ACETAMINOPHEN 325 MG PO TABS
650.0000 mg | ORAL_TABLET | Freq: Four times a day (QID) | ORAL | Status: DC | PRN
  Administered 2022-12-04 – 2022-12-05 (×3): 650 mg via ORAL
  Filled 2022-12-01 (×3): qty 2

## 2022-12-01 MED ORDER — TETANUS-DIPHTH-ACELL PERTUSSIS 5-2.5-18.5 LF-MCG/0.5 IM SUSY
0.5000 mL | PREFILLED_SYRINGE | Freq: Once | INTRAMUSCULAR | Status: AC
  Administered 2022-12-01: 0.5 mL via INTRAMUSCULAR
  Filled 2022-12-01: qty 0.5

## 2022-12-01 MED ORDER — ONDANSETRON HCL 4 MG/2ML IJ SOLN: 4.0000 mg | Freq: Four times a day (QID) | INTRAMUSCULAR | Status: DC | PRN

## 2022-12-01 MED ORDER — LIDOCAINE-EPINEPHRINE-TETRACAINE (LET) TOPICAL GEL
3.0000 mL | Freq: Once | TOPICAL | Status: AC
  Administered 2022-12-01: 3 mL via TOPICAL
  Filled 2022-12-01: qty 3

## 2022-12-01 MED ORDER — ACETAMINOPHEN 325 MG PO TABS
650.0000 mg | ORAL_TABLET | Freq: Once | ORAL | Status: AC
  Administered 2022-12-01: 650 mg via ORAL
  Filled 2022-12-01: qty 2

## 2022-12-01 MED ORDER — PANTOPRAZOLE SODIUM 40 MG IV SOLR
40.0000 mg | Freq: Two times a day (BID) | INTRAVENOUS | Status: DC
  Administered 2022-12-01 – 2022-12-05 (×9): 40 mg via INTRAVENOUS
  Filled 2022-12-01 (×9): qty 10

## 2022-12-01 NOTE — ED Triage Notes (Signed)
Pt from home with reports of sitting on the side of his bed, getting dizzy and falling into the floor hitting the left side of his head Pt taking blood thinner. Pt also reports SHOB. Pt activated as level 2 PTA.

## 2022-12-01 NOTE — Progress Notes (Signed)
Chaplain responded to Level II trauma page. Pt was under the care of the medical team and unable to meet with chaplain at this time. No family present at the bedside.   Chaplain is available for follow up should family or patient desire further support. Please page as further needs arise.    Maryanna Shape. Carley Hammed, M.Div. [facility] Chaplain Pager [phone] Office [phone]  12/01/22 1441  Spiritual Encounters  Type of Visit Initial  Care provided to: Pt not available  Referral source Trauma page  Reason for visit Trauma  OnCall Visit Yes

## 2022-12-01 NOTE — Telephone Encounter (Signed)
noted 

## 2022-12-01 NOTE — Telephone Encounter (Signed)
Wife called to say that the patient fell and hit his head. There is some bleeding, wife is calling to see what she needs to do. Please advise

## 2022-12-01 NOTE — Telephone Encounter (Signed)
Pts wife called in to report that the pt had an unwitnessed syncopal episode this morning and hit his head on the concrete and had a bleeding wound controlled with pressure... he does not remember getting dizzy/ no headache, nausea, palps.... she will call EMS to come and assess him... I advised her that he is not to drive until he sees [place] this Wed 12/03/22.   They will call if anything worsens or changes but advised that he needs to be seen asap for his injury.

## 2022-12-01 NOTE — ED Notes (Signed)
ED TO INPATIENT HANDOFF REPORT  ED Nurse Name and Phone #: [phone]  S Name/Age/Gender Ricky Panther Sr. 77 y.o. male Room/Bed: 040C/040C  Code Status   Code Status: Full Code  Home/SNF/Other Home Patient oriented to: self, place, time, and situation Is this baseline? Yes   Triage Complete: Triage complete  Chief Complaint GI bleed [K92.2]  Triage Note Pt from home with reports of sitting on the side of his bed, getting dizzy and falling into the floor hitting the left side of his head Pt taking blood thinner. Pt also reports SHOB. Pt activated as level 2 PTA.   Allergies Allergies  Allergen Reactions   Ezetimibe Other (See Comments)    Muscle aches   Statins Other (See Comments)    Joint pain    Level of Care/Admitting Diagnosis ED Disposition     ED Disposition  Admit   Condition  --   Comment  Hospital Area: [facility] [[id number]]  Level of Care: Telemetry Cardiac [[id number]]  May admit patient to [facility] or [facility] if equivalent level of care is available:: Yes  Covid Evaluation: Asymptomatic - no recent exposure (last 10 days) testing not required  Diagnosis: GI bleed [454098]  Admitting Physician: Miguel Rota [[id number]]  Attending Physician: Miguel Rota [[id number]]  Certification:: I certify this patient will need inpatient services for at least 2 midnights  Expected Medical Readiness: 12/04/2022          B Medical/Surgery History Past Medical History:  Diagnosis Date   Borderline diabetes    DIET CONTROLLED   Dyslipidemia    Elevated PSA    External carotid artery stenosis    MILD-- BILATERAL   Fall 11/05/2017   GERD (gastroesophageal reflux disease)    Gout, arthritis    Hearing loss in left ear    Heart murmur    MILD --  ASYMPTOMATIC   History of gastric ulcer    REMOTE HX   Hypertension    Pre-diabetes    Prostate cancer (HCC) 12/21/2012   Gleason 3+4=7   Seizure-like activity (HCC)    Syncope and collapse     Wears glasses    Past Surgical History:  Procedure Laterality Date   CORONARY STENT INTERVENTION N/A 11/16/2022   Procedure: CORONARY STENT INTERVENTION;  Surgeon: Elder Negus, MD;  Location: [facility];  Service: Cardiovascular;  Laterality: N/A;   CORONARY ULTRASOUND/IVUS N/A 11/16/2022   Procedure: Coronary Ultrasound/IVUS;  Surgeon: Elder Negus, MD;  Location: [facility];  Service: Cardiovascular;  Laterality: N/A;   KNEE ARTHROSCOPY W/ MENISCAL REPAIR Left 03/10/2012   LEFT HEART CATH AND CORONARY ANGIOGRAPHY N/A 11/16/2022   Procedure: LEFT HEART CATH AND CORONARY ANGIOGRAPHY;  Surgeon: Elder Negus, MD;  Location: [facility];  Service: Cardiovascular;  Laterality: N/A;   LOOP RECORDER INSERTION N/A 01/05/2018   Procedure: LOOP RECORDER INSERTION;  Surgeon: Duke Salvia, MD;  Location: [facility] INVASIVE CV LAB;  Service: Cardiovascular;  Laterality: N/A;   LUMBAR SPINE SURGERY  03/10/1989   PROSTATE BIOPSY N/A 12/21/2012   Procedure: BIOPSY TRANSRECTAL ULTRASONIC PROSTATE (TUBP);  Surgeon: Lindaann Slough, MD;  Location: [facility];  Service: Urology;  Laterality: N/A;   PROSTATE BIOPSY  11/26/2011   benign   TEMPORARY PACEMAKER N/A 11/16/2022   Procedure: TEMPORARY PACEMAKER;  Surgeon: Elder Negus, MD;  Location: [facility];  Service: Cardiovascular;  Laterality: N/A;   TONSILLECTOMY  WISDOM TOOTH EXTRACTION       A IV Location/Drains/Wounds Patient Lines/Drains/Airways Status     Active Line/Drains/Airways     Name Placement date Placement time Site Days   Peripheral IV 12/01/22 20 G Right Antecubital 12/01/22  1133  Antecubital  less than 1   Peripheral IV 12/01/22 20 G 1" Anterior;Right Forearm 12/01/22  1422  Forearm  less than 1            Intake/Output Last 24 hours No intake or output data in the 24 hours ending 12/01/22 1911  Labs/Imaging Results for orders placed or performed  during the hospital encounter of 12/01/22 (from the past 48 hour(s))  Resp panel by RT-PCR (RSV, Flu A&B, Covid) Anterior Nasal Swab     Status: None   Collection Time: 12/01/22 11:30 AM   Specimen: Anterior Nasal Swab  Result Value Ref Range   SARS Coronavirus 2 by RT PCR NEGATIVE NEGATIVE   Influenza A by PCR NEGATIVE NEGATIVE   Influenza B by PCR NEGATIVE NEGATIVE    Comment: (NOTE) The Xpert Xpress SARS-CoV-2/FLU/RSV plus assay is intended as an aid in the diagnosis of influenza from Nasopharyngeal swab specimens and should not be used as a sole basis for treatment. Nasal washings and aspirates are unacceptable for Xpert Xpress SARS-CoV-2/FLU/RSV testing.  Fact Sheet for Patients: BloggerCourse.com  Fact Sheet for Healthcare Providers: SeriousBroker.it  This test is not yet approved or cleared by the [place] FDA and has been authorized for detection and/or diagnosis of SARS-CoV-2 by FDA under an Emergency Use Authorization (EUA). This EUA will remain in effect (meaning this test can be used) for the duration of the COVID-19 declaration under Section 564(b)(1) of the Act, 21 U.S.C. section 360bbb-3(b)(1), unless the authorization is terminated or revoked.     Resp Syncytial Virus by PCR NEGATIVE NEGATIVE    Comment: (NOTE) Fact Sheet for Patients: BloggerCourse.com  Fact Sheet for Healthcare Providers: SeriousBroker.it  This test is not yet approved or cleared by the [place] FDA and has been authorized for detection and/or diagnosis of SARS-CoV-2 by FDA under an Emergency Use Authorization (EUA). This EUA will remain in effect (meaning this test can be used) for the duration of the COVID-19 declaration under Section 564(b)(1) of the Act, 21 U.S.C. section 360bbb-3(b)(1), unless the authorization is terminated or revoked.  Performed at [facility],  [address], [place], [place] [address]   CBC with Differential     Status: Abnormal   Collection Time: 12/01/22 11:32 AM  Result Value Ref Range   WBC 8.2 4.0 - 10.5 K/uL   RBC 2.38 (L) 4.22 - 5.81 MIL/uL   Hemoglobin 7.5 (L) 13.0 - 17.0 g/dL   HCT 01.6 (L) 01.0 - 93.2 %   MCV 97.9 80.0 - 100.0 fL   MCH 31.5 26.0 - 34.0 pg   MCHC 32.2 30.0 - 36.0 g/dL   RDW 35.5 73.2 - 20.2 %   Platelets 292 150 - 400 K/uL   nRBC 0.0 0.0 - 0.2 %   Neutrophils Relative % 76 %   Neutro Abs 6.2 1.7 - 7.7 K/uL   Lymphocytes Relative 16 %   Lymphs Abs 1.4 0.7 - 4.0 K/uL   Monocytes Relative 7 %   Monocytes Absolute 0.6 0.1 - 1.0 K/uL   Eosinophils Relative 0 %   Eosinophils Absolute 0.0 0.0 - 0.5 K/uL   Basophils Relative 0 %   Basophils Absolute 0.0 0.0 - 0.1 K/uL  Immature Granulocytes 1 %   Abs Immature Granulocytes 0.05 0.00 - 0.07 K/uL    Comment: Performed at [facility], [address] [place], [place] [address]  Basic metabolic panel     Status: Abnormal   Collection Time: 12/01/22 11:32 AM  Result Value Ref Range   Sodium 136 135 - 145 mmol/L   Potassium 5.0 3.5 - 5.1 mmol/L   Chloride 103 98 - 111 mmol/L   CO2 20 (L) 22 - 32 mmol/L   Glucose, Bld 149 (H) 70 - 99 mg/dL    Comment: Glucose reference range applies only to samples taken after fasting for at least 8 hours.   BUN 43 (H) 8 - 23 mg/dL   Creatinine, Ser 6.64 (H) 0.61 - 1.24 mg/dL   Calcium 40.3 8.9 - 47.4 mg/dL   GFR, Estimated 29 (L) >60 mL/min    Comment: (NOTE) Calculated using the CKD-EPI Creatinine Equation (2021)    Anion gap 13 5 - 15    Comment: Performed at [facility], [address] [place], [place] [address]  Troponin I (High Sensitivity)     Status: Abnormal   Collection Time: 12/01/22 11:32 AM  Result Value Ref Range   Troponin I (High Sensitivity) 27 (H) <18 ng/L    Comment: (NOTE) Elevated high sensitivity troponin I (hsTnI) values and significant  changes across serial measurements may  suggest ACS but many other  chronic and acute conditions are known to elevate hsTnI results.  Refer to the "Links" section for chest pain algorithms and additional  guidance. Performed at [facility], [address], [place], [place] [address]   ABO/Rh     Status: None   Collection Time: 12/01/22 11:32 AM  Result Value Ref Range   ABO/RH(D)      A POS Performed at [facility], [address], [place], [place] [address]   D-dimer, quantitative     Status: Abnormal   Collection Time: 12/01/22 11:47 AM  Result Value Ref Range   D-Dimer, Quant 1.82 (H) 0.00 - 0.50 ug/mL-FEU    Comment: (NOTE) At the manufacturer cut-off value of 0.5 g/mL FEU, this assay has a negative predictive value of 95-100%.This assay is intended for use in conjunction with a clinical pretest probability (PTP) assessment model to exclude pulmonary embolism (PE) and deep venous thrombosis (DVT) in outpatients suspected of PE or DVT. Results should be correlated with clinical presentation. Performed at [facility], [address] [place], [place] [address]   Troponin I (High Sensitivity)     Status: Abnormal   Collection Time: 12/01/22  1:44 PM  Result Value Ref Range   Troponin I (High Sensitivity) 29 (H) <18 ng/L    Comment: (NOTE) Elevated high sensitivity troponin I (hsTnI) values and significant  changes across serial measurements may suggest ACS but many other  chronic and acute conditions are known to elevate hsTnI results.  Refer to the "Links" section for chest pain algorithms and additional  guidance. Performed at [facility], [address] [place], [place] [address]   Prepare RBC (crossmatch)     Status: None   Collection Time: 12/01/22  1:48 PM  Result Value Ref Range   Order Confirmation      ORDER PROCESSED BY BLOOD BANK Performed at [facility], [address] [place], [place] [address]   Type and screen [facility]     Status: None  (  Preliminary result)   Collection Time: 12/01/22  2:24 PM  Result Value Ref Range   ABO/RH(D) A POS    Antibody Screen NEG    Sample Expiration 12/04/2022,2359    Unit Number [id number]    Blood Component Type RBC LR PHER1    Unit division 00    Status of Unit ISSUED    Transfusion Status OK TO TRANSFUSE    Crossmatch Result      Compatible Performed at [facility], [address], [place], [place] [address]    CT Head Wo Contrast  Result Date: 12/01/2022 CLINICAL DATA:  Patient fell off the bed hitting left side of his head on the floor. EXAM: CT HEAD WITHOUT CONTRAST CT CERVICAL SPINE WITHOUT CONTRAST TECHNIQUE: Multidetector CT imaging of the head and cervical spine was performed following the standard protocol without intravenous contrast. Multiplanar CT image reconstructions of the cervical spine were also generated. RADIATION DOSE REDUCTION: This exam was performed according to the departmental dose-optimization program which includes automated exposure control, adjustment of the mA and/or kV according to patient size and/or use of iterative reconstruction technique. COMPARISON:  Brain MRI 02/03/2015 FINDINGS: CT HEAD FINDINGS Brain: There is no evidence for acute hemorrhage, hydrocephalus, mass lesion, or abnormal extra-axial fluid collection. No definite CT evidence for acute infarction. Chronic lacunar infarct noted right basal ganglia. Vascular: No hyperdense vessel or unexpected calcification. Skull: No evidence for fracture. No worrisome lytic or sclerotic lesion. Sinuses/Orbits: The visualized paranasal sinuses and mastoid air cells are clear. Visualized portions of the globes and intraorbital fat are unremarkable. Other: None. CT CERVICAL SPINE FINDINGS Alignment: Straightening of normal cervical lordosis. No subluxation. Skull base and vertebrae: No acute fracture. No primary bone lesion or focal pathologic process. Soft tissues and spinal canal: No prevertebral fluid or  swelling. No visible canal hematoma. Disc levels: Intervertebral disc spaces are preserved. The facets are well aligned bilaterally. Upper chest: Unremarkable. Other: None. IMPRESSION: 1. No acute intracranial abnormality. 2. Chronic lacunar infarct right basal ganglia. 3. Loss of cervical lordosis. This can be related to patient positioning, muscle spasm or soft tissue injury. Electronically Signed   By: Kennith Center M.D.   On: 12/01/2022 13:03   CT Cervical Spine Wo Contrast  Result Date: 12/01/2022 CLINICAL DATA:  Patient fell off the bed hitting left side of his head on the floor. EXAM: CT HEAD WITHOUT CONTRAST CT CERVICAL SPINE WITHOUT CONTRAST TECHNIQUE: Multidetector CT imaging of the head and cervical spine was performed following the standard protocol without intravenous contrast. Multiplanar CT image reconstructions of the cervical spine were also generated. RADIATION DOSE REDUCTION: This exam was performed according to the departmental dose-optimization program which includes automated exposure control, adjustment of the mA and/or kV according to patient size and/or use of iterative reconstruction technique. COMPARISON:  Brain MRI 02/03/2015 FINDINGS: CT HEAD FINDINGS Brain: There is no evidence for acute hemorrhage, hydrocephalus, mass lesion, or abnormal extra-axial fluid collection. No definite CT evidence for acute infarction. Chronic lacunar infarct noted right basal ganglia. Vascular: No hyperdense vessel or unexpected calcification. Skull: No evidence for fracture. No worrisome lytic or sclerotic lesion. Sinuses/Orbits: The visualized paranasal sinuses and mastoid air cells are clear. Visualized portions of the globes and intraorbital fat are unremarkable. Other: None. CT CERVICAL SPINE FINDINGS Alignment: Straightening of normal cervical lordosis. No subluxation. Skull base and vertebrae: No acute fracture. No primary bone lesion or focal pathologic process. Soft tissues and spinal canal: No  prevertebral fluid or swelling. No  visible canal hematoma. Disc levels: Intervertebral disc spaces are preserved. The facets are well aligned bilaterally. Upper chest: Unremarkable. Other: None. IMPRESSION: 1. No acute intracranial abnormality. 2. Chronic lacunar infarct right basal ganglia. 3. Loss of cervical lordosis. This can be related to patient positioning, muscle spasm or soft tissue injury. Electronically Signed   By: Kennith Center M.D.   On: 12/01/2022 13:03   DG Chest Portable 1 View  Result Date: 12/01/2022 CLINICAL DATA:  Shortness of breath. EXAM: PORTABLE CHEST 1 VIEW COMPARISON:  11/14/2022 FINDINGS: The lungs are clear without focal pneumonia, edema, pneumothorax or pleural effusion. Cardiopericardial silhouette is at upper limits of normal for size. No acute bony abnormality. Telemetry leads overlie the chest. IMPRESSION: No active disease. Electronically Signed   By: Kennith Center M.D.   On: 12/01/2022 12:58    Pending Labs Unresulted Labs (From admission, onward)     Start     Ordered   12/02/22 0500  Basic metabolic panel  Daily,   R      12/01/22 1359   12/02/22 0500  CBC  Daily,   R      12/01/22 1359   12/02/22 0500  Phosphorus  Tomorrow morning,   R        12/01/22 1400   12/02/22 0500  Magnesium  Tomorrow morning,   R        12/01/22 1400            Vitals/Pain Today's Vitals   12/01/22 1500 12/01/22 1709 12/01/22 1717 12/01/22 1732  BP: (!) 148/75  (!) 157/78 (!) 141/63  Pulse: 65  66 71  Resp: 17  18 18   Temp:  98.8 F (37.1 C) 97.8 F (36.6 C) 99.1 F (37.3 C)  TempSrc:  Oral Oral Oral  SpO2: 100%   100%  PainSc:        Isolation Precautions No active isolations  Medications Medications  lidocaine (LIDODERM) 5 % 1 patch (1 patch Transdermal Patch Applied 12/01/22 1435)  hydrALAZINE (APRESOLINE) injection 10 mg (has no administration in time range)  senna-docusate (Senokot-S) tablet 1 tablet (has no administration in time range)  traZODone  (DESYREL) tablet 50 mg (has no administration in time range)  acetaminophen (TYLENOL) tablet 650 mg (has no administration in time range)  ondansetron (ZOFRAN) injection 4 mg (has no administration in time range)  ipratropium-albuterol (DUONEB) 0.5-2.5 (3) MG/3ML nebulizer solution 3 mL (has no administration in time range)  0.9 %  sodium chloride infusion ( Intravenous New Bag/Given 12/01/22 1439)  pantoprazole (PROTONIX) injection 40 mg (40 mg Intravenous Given 12/01/22 1443)  lidocaine-EPINEPHrine-tetracaine (LET) topical gel (3 mLs Topical Given 12/01/22 1157)  acetaminophen (TYLENOL) tablet 650 mg (650 mg Oral Given 12/01/22 1434)  0.9 %  sodium chloride infusion (Manually program via Guardrails IV Fluids) ( Intravenous New Bag/Given 12/01/22 1441)  Tdap (BOOSTRIX) injection 0.5 mL (0.5 mLs Intramuscular Given 12/01/22 1442)    Mobility walks with person assist     Focused Assessments Cardiac Assessment Handoff:  Cardiac Rhythm: Normal sinus rhythm No results found for: "CKTOTAL", "CKMB", "CKMBINDEX", "TROPONINI" Lab Results  Component Value Date   DDIMER 1.82 (H) 12/01/2022   Does the Patient currently have chest pain? No    R Recommendations: See Admitting Provider Note  Report given to:   Additional Notes:  Patient c/o dark stools, since d/c'd on 9/8 Pt reports shortness of breath with exertion.

## 2022-12-01 NOTE — Progress Notes (Signed)
Orthopedic Tech Progress Note Patient Details:  Ricky Rowland 15-Feb-1946 [id number] Level 2 Trauma  Patient ID: Ricky Schimke., male   DOB: December 12, 1945, 77 y.o.   MRN: [id number]  Ricky Rowland 12/01/2022, 11:38 AM

## 2022-12-01 NOTE — H&P (Signed)
History and Physical    Ricky Rowland GNF:[id number] DOB: 10/27/1945 DOA: 12/01/2022  PCP: [name], MD Patient coming from: Home  Chief Complaint: Syncope and bleedig.   HPI: Ricky Rowland is a 77 y.o. male with medical history significant of CAD status post recent PCI September 2024 on aspirin and Effient, HLD, HTN, MGUS, CKD 3B comes to the ED for syncopal episode.  Patient states he was letting the dogs out earlier today when he got dizzy and lightheaded.  Soon he sat down on the edge of his car and later woke up on the ground with laceration on his scalp and bleeding.  Patient also reports that he has been having melanotic stools since his discharge on 9/8.  He has been compliant with his medications.  In the ER he was noted to have hemoglobin of 7.5, baseline 9.0, creatinine 2.23, baseline 1.7.  EDP consulted cardiology and GI.  Medical team requested to admit the patient.  Patient reports to me that he has had colonoscopy and endoscopy with [facility] about a year ago.  He was found to have cancerous polyp?  Which was removed.   Review of Systems: As per HPI otherwise 10 point review of systems negative.  Review of Systems Otherwise negative except as per HPI, including: General: Denies fever, chills, night sweats or unintended weight loss. Resp: Denies cough, wheezing, shortness of breath. Cardiac: Denies chest pain, palpitations, orthopnea, paroxysmal nocturnal dyspnea. GI: Denies abdominal pain, nausea, vomiting, diarrhea or constipation GU: Denies dysuria, frequency, hesitancy or incontinence MS: Denies muscle aches, joint pain or swelling Neuro: Denies headache, neurologic deficits (focal weakness, numbness, tingling), abnormal gait Psych: Denies anxiety, depression, SI/HI/AVH Skin: Denies new rashes or lesions ID: Denies sick contacts, exotic exposures, travel  Past Medical History:  Diagnosis Date   Borderline diabetes    DIET CONTROLLED    Dyslipidemia    Elevated PSA    External carotid artery stenosis    MILD-- BILATERAL   Fall 11/05/2017   GERD (gastroesophageal reflux disease)    Gout, arthritis    Hearing loss in left ear    Heart murmur    MILD --  ASYMPTOMATIC   History of gastric ulcer    REMOTE HX   Hypertension    Pre-diabetes    Prostate cancer (HCC) 12/21/2012   Gleason 3+4=7   Seizure-like activity (HCC)    Syncope and collapse    Wears glasses     Past Surgical History:  Procedure Laterality Date   CORONARY STENT INTERVENTION N/A 11/16/2022   Procedure: CORONARY STENT INTERVENTION;  Surgeon: Elder Negus, MD;  Location: [facility];  Service: Cardiovascular;  Laterality: N/A;   CORONARY ULTRASOUND/IVUS N/A 11/16/2022   Procedure: Coronary Ultrasound/IVUS;  Surgeon: Elder Negus, MD;  Location: [facility];  Service: Cardiovascular;  Laterality: N/A;   KNEE ARTHROSCOPY W/ MENISCAL REPAIR Left 03/10/2012   LEFT HEART CATH AND CORONARY ANGIOGRAPHY N/A 11/16/2022   Procedure: LEFT HEART CATH AND CORONARY ANGIOGRAPHY;  Surgeon: Elder Negus, MD;  Location: [facility];  Service: Cardiovascular;  Laterality: N/A;   LOOP RECORDER INSERTION N/A 01/05/2018   Procedure: LOOP RECORDER INSERTION;  Surgeon: Duke Salvia, MD;  Location: [facility] CV [facility];  Service: Cardiovascular;  Laterality: N/A;   LUMBAR SPINE SURGERY  03/10/1989   PROSTATE BIOPSY N/A 12/21/2012   Procedure: BIOPSY TRANSRECTAL ULTRASONIC PROSTATE (TUBP);  Surgeon: Lindaann Slough, MD;  Location: [facility];  Service: Urology;  Laterality: N/A;   PROSTATE BIOPSY  11/26/2011   benign   TEMPORARY PACEMAKER N/A 11/16/2022   Procedure: TEMPORARY PACEMAKER;  Surgeon: Elder Negus, MD;  Location: [facility];  Service: Cardiovascular;  Laterality: N/A;   TONSILLECTOMY     WISDOM TOOTH EXTRACTION      SOCIAL HISTORY:  reports that he quit smoking about 24 years ago. His  smoking use included cigarettes. He started smoking about 54 years ago. He has a 30 pack-year smoking history. He has never used smokeless tobacco. He reports that he does not currently use alcohol after a past usage of about 20.0 standard drinks of alcohol per week. He reports that he does not use drugs.  Allergies  Allergen Reactions   Ezetimibe Other (See Comments)    Muscle aches   Statins Other (See Comments)    Joint pain    FAMILY HISTORY: Family History  Problem Relation Age of Onset   Heart disease Mother    Heart disease Father    Cancer Sister        lung   Cancer Brother        prostate     Prior to Admission medications   Medication Sig Start Date End Date Taking? Authorizing Provider  acetaminophen (TYLENOL) 650 MG CR tablet Take 650 mg by mouth every 8 (eight) hours as needed for pain.    [provider]  allopurinol (ZYLOPRIM) 300 MG tablet Take 300 mg by mouth daily.  Patient not taking: Reported on 11/26/2022    [provider]  aspirin EC 81 MG tablet Take 1 tablet (81 mg total) by mouth daily. Swallow whole. 11/19/22   Dunn, Tacey Ruiz, PA-C  Multiple Vitamins-Minerals (MULTIVITAMIN ADULT EXTRA C PO) Take 1 tablet by mouth daily. 07/24/22   [provider]  Naphazoline HCl (CLEAR EYES OP) Place 1 drop into both eyes daily as needed (dry eyes).     [provider]  nitroGLYCERIN (NITROSTAT) 0.4 MG SL tablet Place 1 tablet (0.4 mg total) under the tongue every 5 (five) minutes as needed for chest pain (up to 3 doses). 11/19/22   Dunn, Tacey Ruiz, PA-C  omeprazole (PRILOSEC) 20 MG capsule Take 20 mg by mouth daily.    [provider]  ORGOVYX 120 MG tablet Take 120 mg by mouth daily. 05/19/22   [provider]  prasugrel (EFFIENT) 10 MG TABS tablet Take 1 tablet (10 mg total) by mouth daily. 11/20/22   Dunn, Dayna N, PA-C  REPATHA SURECLICK 140 MG/ML SOAJ Inject 140 mg into the skin every 14 (fourteen) days.  06/07/17    [provider]  tolnaftate (TINACTIN) 1 % cream Apply 1 application topically daily as needed (jock itch).    [provider]  XTANDI 40 MG capsule Take 160 mg by mouth daily.    [provider]    Physical Exam: Vitals:   12/01/22 1215 12/01/22 1230 12/01/22 1245 12/01/22 1300  BP: (!) 152/73 (!) 145/64 (!) 142/69 (!) 146/76  Pulse: 72 70 68 66  Resp: 17 18 18 16   Temp:      TempSrc:      SpO2: 100% 100% 100% 100%      Constitutional: NAD, calm, comfortable Eyes: PERRL, lids and conjunctivae normal ENMT: Mucous membranes are moist. Posterior pharynx clear of any exudate or lesions.Normal dentition.  Neck: normal, supple, no masses, no thyromegaly Respiratory: clear to auscultation bilaterally, no wheezing, no crackles.  Normal respiratory effort. No accessory muscle use.  Cardiovascular: Regular rate and rhythm, no murmurs / rubs / gallops. No extremity edema. 2+ pedal pulses. No carotid bruits.  Abdomen: no tenderness, no masses palpated. No hepatosplenomegaly. Bowel sounds positive.  Musculoskeletal: no clubbing / cyanosis. No joint deformity upper and lower extremities. Good ROM, no contractures. Normal muscle tone.  Skin: no rashes, lesions, ulcers. No induration Neurologic: CN 2-12 grossly intact. Sensation intact, DTR normal. Strength 5/5 in all 4.  Psychiatric: Normal judgment and insight. Alert and oriented x 3. Normal mood.     Labs on Admission: I have personally reviewed following labs and imaging studies  CBC: Recent Labs  Lab 11/26/22 1251 12/01/22 1132  WBC 8.3 8.2  NEUTROABS  --  6.2  HGB 9.0* 7.5*  HCT 27.1* 23.3*  MCV 97.5 97.9  PLT 284 292   Basic Metabolic Panel: Recent Labs  Lab 11/26/22 1251 12/01/22 1132  NA 132* 136  K 5.3* 5.0  CL 101 103  CO2 21* 20*  GLUCOSE 126* 149*  BUN 46* 43*  CREATININE 2.10* 2.23*  CALCIUM 9.9 10.2   GFR: Estimated Creatinine Clearance: 32.1 mL/min (A) (by C-G formula based on  SCr of 2.23 mg/dL (H)). Liver Function Tests: Recent Labs  Lab 11/26/22 1251  AST 22  ALT 18  ALKPHOS 79  BILITOT 0.9  PROT 7.9  ALBUMIN 3.7   No results for input(s): "LIPASE", "AMYLASE" in the last 168 hours. No results for input(s): "AMMONIA" in the last 168 hours. Coagulation Profile: No results for input(s): "INR", "PROTIME" in the last 168 hours. Cardiac Enzymes: No results for input(s): "CKTOTAL", "CKMB", "CKMBINDEX", "TROPONINI" in the last 168 hours. BNP (last 3 results) No results for input(s): "PROBNP" in the last 8760 hours. HbA1C: No results for input(s): "HGBA1C" in the last 72 hours. CBG: No results for input(s): "GLUCAP" in the last 168 hours. Lipid Profile: No results for input(s): "CHOL", "HDL", "LDLCALC", "TRIG", "CHOLHDL", "LDLDIRECT" in the last 72 hours. Thyroid Function Tests: No results for input(s): "TSH", "T4TOTAL", "FREET4", "T3FREE", "THYROIDAB" in the last 72 hours. Anemia Panel: No results for input(s): "VITAMINB12", "FOLATE", "FERRITIN", "TIBC", "IRON", "RETICCTPCT" in the last 72 hours. Urine analysis:    Component Value Date/Time   COLORURINE YELLOW 11/05/2017 1454   APPEARANCEUR CLEAR 11/05/2017 1454   LABSPEC 1.013 11/05/2017 1454   PHURINE 6.0 11/05/2017 1454   GLUCOSEU NEGATIVE 11/05/2017 1454   HGBUR NEGATIVE 11/05/2017 1454   BILIRUBINUR NEGATIVE 11/05/2017 1454   KETONESUR NEGATIVE 11/05/2017 1454   PROTEINUR 100 (A) 11/05/2017 1454   NITRITE NEGATIVE 11/05/2017 1454   LEUKOCYTESUR NEGATIVE 11/05/2017 1454   Sepsis Labs: !!!!!!!!!!!!!!!!!!!!!!!!!!!!!!!!!!!!!!!!!!!! @LABRCNTIP (procalcitonin:4,lacticidven:4) ) Recent Results (from the past 240 hour(s))  Resp panel by RT-PCR (RSV, Flu A&B, Covid) Anterior Nasal Swab     Status: None   Collection Time: 12/01/22 11:30 AM   Specimen: Anterior Nasal Swab  Result Value Ref Range Status   SARS Coronavirus 2 by RT PCR NEGATIVE NEGATIVE Final   Influenza A by PCR NEGATIVE NEGATIVE  Final   Influenza B by PCR NEGATIVE NEGATIVE Final    Comment: (NOTE) The Xpert Xpress SARS-CoV-2/FLU/RSV plus assay is intended as an aid in the diagnosis of influenza from Nasopharyngeal swab specimens and should not be used as a sole basis for treatment. Nasal washings and aspirates are unacceptable for Xpert Xpress SARS-CoV-2/FLU/RSV testing.  Fact Sheet for Patients: BloggerCourse.com  Fact Sheet for Healthcare Providers: SeriousBroker.it  This test is not yet approved  or cleared by the [place] and has been authorized for detection and/or diagnosis of SARS-CoV-2 by FDA under an Emergency Use Authorization (EUA). This EUA will remain in effect (meaning this test can be used) for the duration of the COVID-19 declaration under Section 564(b)(1) of the Act, 21 U.S.C. section 360bbb-3(b)(1), unless the authorization is terminated or revoked.     Resp Syncytial Virus by PCR NEGATIVE NEGATIVE Final    Comment: (NOTE) Fact Sheet for Patients: BloggerCourse.com  Fact Sheet for Healthcare Providers: SeriousBroker.it  This test is not yet approved or cleared by the [place] FDA and has been authorized for detection and/or diagnosis of SARS-CoV-2 by FDA under an Emergency Use Authorization (EUA). This EUA will remain in effect (meaning this test can be used) for the duration of the COVID-19 declaration under Section 564(b)(1) of the Act, 21 U.S.C. section 360bbb-3(b)(1), unless the authorization is terminated or revoked.  Performed at [facility], [address] [place], [place] [address]      Radiological Exams on Admission: CT Head Wo Contrast  Result Date: 12/01/2022 CLINICAL DATA:  Patient fell off the bed hitting left side of his head on the floor. EXAM: CT HEAD WITHOUT CONTRAST CT CERVICAL SPINE WITHOUT CONTRAST TECHNIQUE: Multidetector CT imaging of  the head and cervical spine was performed following the standard protocol without intravenous contrast. Multiplanar CT image reconstructions of the cervical spine were also generated. RADIATION DOSE REDUCTION: This exam was performed according to the departmental dose-optimization program which includes automated exposure control, adjustment of the mA and/or kV according to patient size and/or use of iterative reconstruction technique. COMPARISON:  Brain MRI 02/03/2015 FINDINGS: CT HEAD FINDINGS Brain: There is no evidence for acute hemorrhage, hydrocephalus, mass lesion, or abnormal extra-axial fluid collection. No definite CT evidence for acute infarction. Chronic lacunar infarct noted right basal ganglia. Vascular: No hyperdense vessel or unexpected calcification. Skull: No evidence for fracture. No worrisome lytic or sclerotic lesion. Sinuses/Orbits: The visualized paranasal sinuses and mastoid air cells are clear. Visualized portions of the globes and intraorbital fat are unremarkable. Other: None. CT CERVICAL SPINE FINDINGS Alignment: Straightening of normal cervical lordosis. No subluxation. Skull base and vertebrae: No acute fracture. No primary bone lesion or focal pathologic process. Soft tissues and spinal canal: No prevertebral fluid or swelling. No visible canal hematoma. Disc levels: Intervertebral disc spaces are preserved. The facets are well aligned bilaterally. Upper chest: Unremarkable. Other: None. IMPRESSION: 1. No acute intracranial abnormality. 2. Chronic lacunar infarct right basal ganglia. 3. Loss of cervical lordosis. This can be related to patient positioning, muscle spasm or soft tissue injury. Electronically Signed   By: Kennith Center M.D.   On: 12/01/2022 13:03   CT Cervical Spine Wo Contrast  Result Date: 12/01/2022 CLINICAL DATA:  Patient fell off the bed hitting left side of his head on the floor. EXAM: CT HEAD WITHOUT CONTRAST CT CERVICAL SPINE WITHOUT CONTRAST TECHNIQUE:  Multidetector CT imaging of the head and cervical spine was performed following the standard protocol without intravenous contrast. Multiplanar CT image reconstructions of the cervical spine were also generated. RADIATION DOSE REDUCTION: This exam was performed according to the departmental dose-optimization program which includes automated exposure control, adjustment of the mA and/or kV according to patient size and/or use of iterative reconstruction technique. COMPARISON:  Brain MRI 02/03/2015 FINDINGS: CT HEAD FINDINGS Brain: There is no evidence for acute hemorrhage, hydrocephalus, mass lesion, or abnormal extra-axial fluid collection. No definite CT evidence for acute infarction.  Chronic lacunar infarct noted right basal ganglia. Vascular: No hyperdense vessel or unexpected calcification. Skull: No evidence for fracture. No worrisome lytic or sclerotic lesion. Sinuses/Orbits: The visualized paranasal sinuses and mastoid air cells are clear. Visualized portions of the globes and intraorbital fat are unremarkable. Other: None. CT CERVICAL SPINE FINDINGS Alignment: Straightening of normal cervical lordosis. No subluxation. Skull base and vertebrae: No acute fracture. No primary bone lesion or focal pathologic process. Soft tissues and spinal canal: No prevertebral fluid or swelling. No visible canal hematoma. Disc levels: Intervertebral disc spaces are preserved. The facets are well aligned bilaterally. Upper chest: Unremarkable. Other: None. IMPRESSION: 1. No acute intracranial abnormality. 2. Chronic lacunar infarct right basal ganglia. 3. Loss of cervical lordosis. This can be related to patient positioning, muscle spasm or soft tissue injury. Electronically Signed   By: Kennith Center M.D.   On: 12/01/2022 13:03   DG Chest Portable 1 View  Result Date: 12/01/2022 CLINICAL DATA:  Shortness of breath. EXAM: PORTABLE CHEST 1 VIEW COMPARISON:  11/14/2022 FINDINGS: The lungs are clear without focal pneumonia,  edema, pneumothorax or pleural effusion. Cardiopericardial silhouette is at upper limits of normal for size. No acute bony abnormality. Telemetry leads overlie the chest. IMPRESSION: No active disease. Electronically Signed   By: Kennith Center M.D.   On: 12/01/2022 12:58     All images have been reviewed by me personally.  EKG: Independently reviewed. Non ischemic  Assessment/Plan Principal Problem:   GI bleed   Symptomatic anemia with concern for GI bleed/melena Syncope - Admit patient to the hospital, baseline hemoglobin 9.0, admission hemoglobin 7.5.  Supportive care, closely monitor hemoglobin levels.  PPI IV twice daily.  [facility] and [facility] cardiology to be consulted by EDP -CT head, cervical spine negative  EEG and colonoscopy performed by [facility] about a year ago, apparently he was found to have hiatal hernia and cancerous polyp which was removed.  AKI with history of CKD stage IIIb - Baseline creatinine 1.7, admission creatinine 2.23.  Gentle fluids.  Scalp laceration, left - Secondary to fall. CT H and C spine= neg.  - EDP placed sutures.  CAD status post PCI - Patient underwent PCI by cardiology on 9/8.  Eventually was discharged on aspirin and Effient which is currently on hold.  Cardiology team.  Bradycardia/PAC - Not on beta-blocker, w was supposed to be given 30-day monitor but patient has not received it yet  History of MGUS - Follows with Dr. Shirline Frees  History of ischemic cardiomyopathy with reduced EF, 40% - Not in any acute exacerbation.  Will hold home regimen while NPO   DVT prophylaxis: SCDs Code Status: Full code Family Communication: Daughter and spouse at bedside Consults called: Cardiology and [facility] gastroenterology to be called by EDP Admission status: Admit to cardiac telemetry  Status is: Inpatient Remains inpatient appropriate because: On going GI and Cardio eval.    Time Spent: 65 minutes.  >50% of the time was devoted to discussing the  patients care, assessment, plan and disposition with other care givers along with counseling the patient about the risks and benefits of treatment.    Ricky Rota MD [facility]  If 7PM-7AM, please contact night-coverage   12/01/2022, 2:07 PM

## 2022-12-01 NOTE — ED Provider Notes (Addendum)
[place] EMERGENCY DEPARTMENT AT [facility] Provider Note   CSN: [id number] Arrival date & time: 12/01/22  1127     History  Chief Complaint  Patient presents with   Ricky Rowland. is a 77 y.o. male.  Patient is a 77 year old male with past medical history of CAD, hyperlipidemia, hypertension, prediabetes, with a recent recent admission for myocardial infarction with left heart cath and coronary angiography on 11/16/2022 with 95% stenosis of the LAD presenting for syncopal episode with blunt head trauma on blood thinners.  Patient states he was letting his dogs out today when he felt his lightheaded and dizzy.  He states he sat down on the edge of his car when he woke up on the ground.  He admits to laceration to the left scalp and bleeding.  He denies any chest pain at this time.  He does endorse shortness of breath with exertion that has been progressing since his stent placement.  He denies any lower extremity pain or swelling.  Denies history of previous DVT or PE. Hx of prostate cancer with mets to L4.  Of note, patient admits to melena since discharge after cath on 9/8. Currently on prasugrel (EFFIENT) 10 MG TABS tablet and ASA 81mg  daily.  The history is provided by the patient. No language interpreter was used.  Fall Pertinent negatives include no chest pain, no abdominal pain and no shortness of breath.       Home Medications Prior to Admission medications   Medication Sig Start Date End Date Taking? Authorizing Provider  acetaminophen (TYLENOL) 650 MG CR tablet Take 650 mg by mouth every 8 (eight) hours as needed for pain.    [provider]  allopurinol (ZYLOPRIM) 300 MG tablet Take 300 mg by mouth daily.  Patient not taking: Reported on 11/26/2022    [provider]  aspirin EC 81 MG tablet Take 1 tablet (81 mg total) by mouth daily. Swallow whole. 11/19/22   Dunn, Tacey Ruiz, PA-C  Multiple Vitamins-Minerals (MULTIVITAMIN ADULT  EXTRA C PO) Take 1 tablet by mouth daily. 07/24/22   [provider]  Naphazoline HCl (CLEAR EYES OP) Place 1 drop into both eyes daily as needed (dry eyes).     [provider]  nitroGLYCERIN (NITROSTAT) 0.4 MG SL tablet Place 1 tablet (0.4 mg total) under the tongue every 5 (five) minutes as needed for chest pain (up to 3 doses). 11/19/22   Dunn, Tacey Ruiz, PA-C  omeprazole (PRILOSEC) 20 MG capsule Take 20 mg by mouth daily.    [provider]  ORGOVYX 120 MG tablet Take 120 mg by mouth daily. 05/19/22   [provider]  prasugrel (EFFIENT) 10 MG TABS tablet Take 1 tablet (10 mg total) by mouth daily. 11/20/22   Dunn, Dayna N, PA-C  REPATHA SURECLICK 140 MG/ML SOAJ Inject 140 mg into the skin every 14 (fourteen) days.  06/07/17   [provider]  tolnaftate (TINACTIN) 1 % cream Apply 1 application topically daily as needed (jock itch).    [provider]  XTANDI 40 MG capsule Take 160 mg by mouth daily.    [provider]      Allergies    Ezetimibe and Statins    Review of Systems   Review of Systems  Constitutional:  Negative for chills and fever.  HENT:  Negative for ear pain and sore throat.   Eyes:  Negative for pain and visual disturbance.  Respiratory:  Negative for cough and shortness of breath.   Cardiovascular:  Negative for chest pain and palpitations.  Gastrointestinal:  Negative for abdominal pain and vomiting.  Genitourinary:  Negative for dysuria and hematuria.  Musculoskeletal:  Negative for arthralgias and back pain.  Skin:  Negative for color change and rash.  Neurological:  Negative for seizures and syncope.  All other systems reviewed and are negative.   Physical Exam Updated Vital Signs BP (!) 146/76   Pulse 66   Temp 98 F (36.7 C) (Oral)   Resp 16   SpO2 100%  Physical Exam Vitals and nursing note reviewed.  Constitutional:      General: He is not in acute distress.    Appearance: He is  well-developed.  HENT:     Head:   Eyes:     Conjunctiva/sclera: Conjunctivae normal.  Cardiovascular:     Rate and Rhythm: Normal rate and regular rhythm.     Heart sounds: No murmur heard. Pulmonary:     Effort: Pulmonary effort is normal. No respiratory distress.     Breath sounds: Normal breath sounds.  Abdominal:     Palpations: Abdomen is soft.     Tenderness: There is no abdominal tenderness.  Musculoskeletal:        General: No swelling.     Cervical back: Neck supple.  Skin:    General: Skin is warm and dry.     Capillary Refill: Capillary refill takes less than 2 seconds.  Neurological:     General: No focal deficit present.     Mental Status: He is alert and oriented to person, place, and time.     GCS: GCS eye subscore is 4. GCS verbal subscore is 5. GCS motor subscore is 6.     Cranial Nerves: Cranial nerves 2-12 are intact.     Sensory: Sensation is intact.     Motor: Motor function is intact.     Coordination: Coordination is intact.  Psychiatric:        Mood and Affect: Mood normal.     ED Results / Procedures / Treatments   Labs (all labs ordered are listed, but only abnormal results are displayed) Labs Reviewed  CBC WITH DIFFERENTIAL/PLATELET - Abnormal; Notable for the following components:      Result Value   RBC 2.38 (*)    Hemoglobin 7.5 (*)    HCT 23.3 (*)    All other components within normal limits  BASIC METABOLIC PANEL - Abnormal; Notable for the following components:   CO2 20 (*)    Glucose, Bld 149 (*)    BUN 43 (*)    Creatinine, Ser 2.23 (*)    GFR, Estimated 29 (*)    All other components within normal limits  D-DIMER, QUANTITATIVE - Abnormal; Notable for the following components:   D-Dimer, Quant 1.82 (*)    All other components within normal limits  TROPONIN I (HIGH SENSITIVITY) - Abnormal; Notable for the following components:   Troponin I (High Sensitivity) 27 (*)    All other components within normal limits  RESP PANEL BY  RT-PCR (RSV, FLU A&B, COVID)  RVPGX2  PREPARE RBC (CROSSMATCH)  TYPE AND SCREEN  ABO/RH  TROPONIN I (HIGH SENSITIVITY)    EKG EKG Interpretation Date/Time:  Monday December 01 2022 11:31:33 EDT Ventricular Rate:  89 PR Interval:  154 QRS Duration:  98 QT Interval:  412 QTC Calculation: 502 R Axis:   -43  Text Interpretation: Sinus rhythm Sinus pause Left  axis deviation Borderline T abnormalities, lateral leads Prolonged QT interval Confirmed by Edwin Dada ([id number]) on 12/01/2022 12:14:47 PM  Radiology CT Head Wo Contrast  Result Date: 12/01/2022 CLINICAL DATA:  Patient fell off the bed hitting left side of his head on the floor. EXAM: CT HEAD WITHOUT CONTRAST CT CERVICAL SPINE WITHOUT CONTRAST TECHNIQUE: Multidetector CT imaging of the head and cervical spine was performed following the standard protocol without intravenous contrast. Multiplanar CT image reconstructions of the cervical spine were also generated. RADIATION DOSE REDUCTION: This exam was performed according to the departmental dose-optimization program which includes automated exposure control, adjustment of the mA and/or kV according to patient size and/or use of iterative reconstruction technique. COMPARISON:  Brain MRI 02/03/2015 FINDINGS: CT HEAD FINDINGS Brain: There is no evidence for acute hemorrhage, hydrocephalus, mass lesion, or abnormal extra-axial fluid collection. No definite CT evidence for acute infarction. Chronic lacunar infarct noted right basal ganglia. Vascular: No hyperdense vessel or unexpected calcification. Skull: No evidence for fracture. No worrisome lytic or sclerotic lesion. Sinuses/Orbits: The visualized paranasal sinuses and mastoid air cells are clear. Visualized portions of the globes and intraorbital fat are unremarkable. Other: None. CT CERVICAL SPINE FINDINGS Alignment: Straightening of normal cervical lordosis. No subluxation. Skull base and vertebrae: No acute fracture. No primary bone lesion or  focal pathologic process. Soft tissues and spinal canal: No prevertebral fluid or swelling. No visible canal hematoma. Disc levels: Intervertebral disc spaces are preserved. The facets are well aligned bilaterally. Upper chest: Unremarkable. Other: None. IMPRESSION: 1. No acute intracranial abnormality. 2. Chronic lacunar infarct right basal ganglia. 3. Loss of cervical lordosis. This can be related to patient positioning, muscle spasm or soft tissue injury. Electronically Signed   By: Kennith Center M.D.   On: 12/01/2022 13:03   CT Cervical Spine Wo Contrast  Result Date: 12/01/2022 CLINICAL DATA:  Patient fell off the bed hitting left side of his head on the floor. EXAM: CT HEAD WITHOUT CONTRAST CT CERVICAL SPINE WITHOUT CONTRAST TECHNIQUE: Multidetector CT imaging of the head and cervical spine was performed following the standard protocol without intravenous contrast. Multiplanar CT image reconstructions of the cervical spine were also generated. RADIATION DOSE REDUCTION: This exam was performed according to the departmental dose-optimization program which includes automated exposure control, adjustment of the mA and/or kV according to patient size and/or use of iterative reconstruction technique. COMPARISON:  Brain MRI 02/03/2015 FINDINGS: CT HEAD FINDINGS Brain: There is no evidence for acute hemorrhage, hydrocephalus, mass lesion, or abnormal extra-axial fluid collection. No definite CT evidence for acute infarction. Chronic lacunar infarct noted right basal ganglia. Vascular: No hyperdense vessel or unexpected calcification. Skull: No evidence for fracture. No worrisome lytic or sclerotic lesion. Sinuses/Orbits: The visualized paranasal sinuses and mastoid air cells are clear. Visualized portions of the globes and intraorbital fat are unremarkable. Other: None. CT CERVICAL SPINE FINDINGS Alignment: Straightening of normal cervical lordosis. No subluxation. Skull base and vertebrae: No acute fracture. No  primary bone lesion or focal pathologic process. Soft tissues and spinal canal: No prevertebral fluid or swelling. No visible canal hematoma. Disc levels: Intervertebral disc spaces are preserved. The facets are well aligned bilaterally. Upper chest: Unremarkable. Other: None. IMPRESSION: 1. No acute intracranial abnormality. 2. Chronic lacunar infarct right basal ganglia. 3. Loss of cervical lordosis. This can be related to patient positioning, muscle spasm or soft tissue injury. Electronically Signed   By: Kennith Center M.D.   On: 12/01/2022 13:03   DG Chest Portable 1 View  Result Date: 12/01/2022 CLINICAL DATA:  Shortness of breath. EXAM: PORTABLE CHEST 1 VIEW COMPARISON:  11/14/2022 FINDINGS: The lungs are clear without focal pneumonia, edema, pneumothorax or pleural effusion. Cardiopericardial silhouette is at upper limits of normal for size. No acute bony abnormality. Telemetry leads overlie the chest. IMPRESSION: No active disease. Electronically Signed   By: Kennith Center M.D.   On: 12/01/2022 12:58    Procedures .Critical Care  Performed by: Franne Forts, DO Authorized by: Franne Forts, DO   Critical care provider statement:    Critical care time (minutes):  30   Critical care was necessary to treat or prevent imminent or life-threatening deterioration of the following conditions: acute blood loss anemia with transfusion.   Critical care was time spent personally by me on the following activities:  Development of treatment plan with patient or surrogate, discussions with consultants, evaluation of patient's response to treatment, examination of patient, ordering and review of laboratory studies, ordering and review of radiographic studies, ordering and performing treatments and interventions, pulse oximetry, re-evaluation of patient's condition and review of old charts   Care discussed with: admitting provider     Care discussed with comment:  Gi .Marland KitchenLaceration Repair  Date/Time:  12/01/2022 2:17 PM  Performed by: Franne Forts, DO Authorized by: Franne Forts, DO   Consent:    Consent given by:  Patient   Risks, benefits, and alternatives were discussed: yes     Risks discussed:  Need for additional repair and nerve damage   Alternatives discussed:  No treatment Universal protocol:    Immediately prior to procedure, a time out was called: yes     Patient identity confirmed:  Verbally with patient, arm band and provided demographic data Anesthesia:    Anesthesia method:  Topical application Laceration details:    Location:  Scalp   Scalp location:  L parietal   Length (cm):  3 Skin repair:    Repair method:  Staples   Number of staples:  4 Repair type:    Repair type:  Simple Post-procedure details:    Procedure completion:  Tolerated well, no immediate complications     Medications Ordered in ED Medications  acetaminophen (TYLENOL) tablet 650 mg (has no administration in time range)  lidocaine (LIDODERM) 5 % 1 patch (has no administration in time range)  0.9 %  sodium chloride infusion (Manually program via Guardrails IV Fluids) (has no administration in time range)  metoprolol tartrate (LOPRESSOR) injection 5 mg (has no administration in time range)  hydrALAZINE (APRESOLINE) injection 10 mg (has no administration in time range)  senna-docusate (Senokot-S) tablet 1 tablet (has no administration in time range)  traZODone (DESYREL) tablet 50 mg (has no administration in time range)  acetaminophen (TYLENOL) tablet 650 mg (has no administration in time range)  ondansetron (ZOFRAN) injection 4 mg (has no administration in time range)  ipratropium-albuterol (DUONEB) 0.5-2.5 (3) MG/3ML nebulizer solution 3 mL (has no administration in time range)  0.9 %  sodium chloride infusion (has no administration in time range)  pantoprazole (PROTONIX) injection 40 mg (has no administration in time range)  Tdap (BOOSTRIX) injection 0.5 mL (has no administration in  time range)  lidocaine-EPINEPHrine-tetracaine (LET) topical gel (3 mLs Topical Given 12/01/22 1157)    ED Course/ Medical Decision Making/ A&P  Medical Decision Making Amount and/or Complexity of Data Reviewed Labs: ordered. Radiology: ordered.  Risk OTC drugs. Prescription drug management. Decision regarding hospitalization.   33:50 PM 77 year old male with past medical history of CAD, hyperlipidemia, hypertension, prediabetes, with a recent recent admission for myocardial infarction with left heart cath and coronary angiography on 10/2022 with 95% stenosis of the LAD presenting for syncopal episode with blunt head trauma on blood thinners.  Of note, patient admits to melena since discharge after cath on 9/8. Currently on prasugrel (EFFIENT) 10 MG TABS tablet and ASA 81mg  daily.  On arrival patient is alert and oriented x 3, no acute distress, afebrile, stable vital signs.  Physical exam demonstrates no neurovascular deficits.  Patient has a 3 cm laceration to the left scalp.  No active bleeding.  No foreign bodies visualized.  No depression in the skull.  No Battle sign.  No raccoon eyes.  CT head and neck pending. Able EKG demonstrating sinus rhythm with a rate of 89 bpm.  No ST segment elevation depression.  No T wave inversions.  Stable axis.  Prolonged QTc at 412. Troponin stable. Hemoglobin concerning-7.5 today down from 9 five days ago. Will consult GI for symptomatic anemia and melena. Consider holding prasugrel and asa. 1 unit pRBC ordered for acute blood loss anemia from likley upper gi bleed with syncope and blunt head trauma.  Wound irrigated with normal saline.  Let applied.  Patient's laceration closed with approximately 4 suture staples.  Tdap given.         Final Clinical Impression(s) / ED Diagnoses Final diagnoses:  Fall, initial encounter  Injury of head, initial encounter  Laceration of scalp, initial encounter  Exertional  shortness of breath  Symptomatic anemia  Melena    Rx / DC Orders ED Discharge Orders     None         Franne Forts, DO 12/01/22 1417    Franne Forts, DO 12/01/22 1418

## 2022-12-01 NOTE — Consult Note (Addendum)
Cardiology Consultation   Patient ID: Ricky CAYSON Sr. MRN: [id number]; DOB: [birth year]  Admit date: 12/01/2022 Date of Consult: 12/01/2022  PCP:  [name], MD   [facility] Providers Cardiologist:  Charlton Haws, MD   {   Patient Profile:   Ricky Slicer. is a 77 y.o. 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 Nelson Chimes.  History of Present Illness:   Ricky Rowland was above past medical history presented to the ER today for syncope.  Patient reports becoming lightheaded while letting his dog out today.  He passed out shortly after he sat down on the edge of his car, waking up on the ground in his garage noticing his scalp was scraped and bleeding. He states no one witnessed the syncope. He reports that he has been having dark stool since 11/19/22. He takes all his medication daily including ASA and Effient. He states he had no chest pain since recent PCI on 11/16/22. He was gradually resuming his activity at home, able to walk his dog every morning. Today he felt much more SOB than usual with exertional activity. He does not recall having any PND, leg edema, heart palpitation. He states his radial cath site had healed well.   Per chart review, patient was recently hospitalized here 11/14/2022 to 11/19/2022 for non-STEMI.  LHC on 11/16/2022 revealed 95% proximal mid LAD, treated with PTCA/DES with loss of 1 septal branch, residual disease include 20% left main, 30% mid LAD, 50% proximal circumflex, 50% OM1, CTO proximal RCA with left-to-right collaterals which are treated medically.  He was initiated on aspirin 81 mg daily, effient 10mg  daily  (drug-drug interaction between Brilinta and Xtandi that he takes for prostate cancer and intervention team choose Effient over Plavix) for antiplatelet therapy.  Due to bradycardia, beta-blocker was not given, he did required brief temporary transvenous pacemaker support but this was removed at the end of heart catheterization.  Due to history of statin/Zetia intolerance, he was continued on Repatha and was referred back to lipid clinic.  Echocardiogram revealed LVEF 40 to 45% with regional wall motion abnormality, mild LVH, grade 1 DD, moderate pulmonary hypertension, mild LAE, mild MR, aortic sclerosis.  His EF was 50 to 55% on echo from 2019.  LHC revealed LVEDP 21 mmHg. it was felt he had ischemic cardiomyopathy.  He had intermittent hypotension, losartan was resumed at the time of discharge for GDMT.    He was referred to advanced heart failure clinic and was seen in the office 11/26/2022 by Ricky Medici PA, denied any chest pain or heart failure symptoms, ReDs 31%, reported dark stools, noted hemoglobin had dropped from 11.8 to 8.5 on recent discharge on 11/19/22.  Etiology of cardiomyopathy was felt ischemic +/- infiltrative process, he was recommended cardiac MRI and genetic testing.  No loop diuretic was felt indicated. He was continued losartan 25mg  for GDMT, BP was soft with occasional positional dizziness, spironolactone and SGLT2I were not added and beta blocker was avoided due to bradycardia. Anemia workup was sent to further evaluate melena.   Diagnostic workup today revealed bicarb 20, BUN 43, creatinine 2.23, eGFR 29 (current was 2.1 on 11/26/2022). Hs trop 27>29. CBC with Hgb 7.5.  D dimer elevated 1.82. CXR showed no active disease. CTH without showed no acute abnormalities, chronic lacunar infarct right basal ganglia. CT C spine showed loss of cervical lordosis. EKG showed sinus rhythm 89bpm, non specific T wave abnormalities of lateral leads unchanged. He is hemodynamically stable at ED.  Cardiology is consulted for further input on anti-platelet therapy.     Past Medical History:  Diagnosis Date   Borderline diabetes    DIET CONTROLLED   Dyslipidemia    Elevated PSA    External carotid artery stenosis    MILD-- BILATERAL   Fall 11/05/2017   GERD (gastroesophageal reflux disease)    Gout, arthritis    Hearing loss in left ear    Heart murmur    MILD --  ASYMPTOMATIC   History of gastric ulcer    REMOTE HX   Hypertension    Pre-diabetes    Prostate cancer (HCC) 12/21/2012   Gleason 3+4=7   Seizure-like activity (HCC)    Syncope and collapse    Wears glasses     Past Surgical History:  Procedure Laterality Date   CORONARY STENT INTERVENTION N/A 11/16/2022   Procedure: CORONARY STENT INTERVENTION;  Surgeon: Elder Negus, MD;  Location: [facility] IN[facility]ASIVE CV [facility];  Service: Cardiovascular;  Laterality: N/A;   CORONARY ULTRASOUND/IVUS N/A 11/16/2022   Procedure: Coronary Ultrasound/IVUS;  Surgeon: Elder Negus, MD;  Location: [facility];  Service: Cardiovascular;  Laterality: N/A;   KNEE ARTHROSCOPY W/ MENISCAL REPAIR Left 03/10/2012   LEFT HEART CATH AND CORONARY ANGIOGRAPHY N/A 11/16/2022   Procedure: LEFT HEART CATH AND CORONARY ANGIOGRAPHY;  Surgeon: Elder Negus, MD;  Location: [facility];  Service: Cardiovascular;  Laterality: N/A;   LOOP RECORDER INSERTION N/A 01/05/2018   Procedure: LOOP RECORDER INSERTION;  Surgeon: Duke Salvia, MD;  Location[facility]VASIVE CV LAB;  Service: Cardiovascular;  Laterality: N/A;   LUMBAR SPINE SURGERY  03/10/1989   PROSTATE BIOPSY N/A 12/21/2012   Procedure: BIOPSY TRANSRECTAL ULTRASONIC PROSTATE (TUBP);  Surgeon: Lindaann Slough, MD;  Location: [facility];  Service: Urology;  Laterality: N/A;   PROSTATE BIOPSY  11/26/2011   benign   TEMPORARY PACEMAKER N/A 11/16/2022   Procedure: TEMPORARY PACEMAKER;  Surgeon: Elder Negus, MD;  Location: [facility]AS[facility];  Service:  Cardiovascular;  Laterality: N/A;   TONSILLECTOMY     WISDOM TOOTH EXTRACTION       Home Medications:  Prior to Admission medications   Medication Sig Start Date End Date Taking? Authorizing Provider  acetaminophen (TYLENOL) 650 MG CR tablet Take 650 mg by mouth every 8 (eight) hours as needed for pain.   Yes [provider]  allopurinol (ZYLOPRIM) 300 MG tablet Take 300 mg by mouth daily.   Yes [provider]  aspirin EC 81 MG tablet Take 1 tablet (81 mg total) by mouth daily. Swallow whole. 11/19/22  Yes Dunn, Dayna N, PA-C  Multiple Vitamins-Minerals (MULTIVITAMIN ADULT EXTRA C PO) Take 1 tablet by mouth daily. 07/24/22  Yes [provider]  Naphazoline HCl (CLEAR EYES OP) Place 1 drop into both eyes daily as needed (dry eyes).    Yes [provider]  nitroGLYCERIN (NITROSTAT) 0.4 MG SL tablet Place 1 tablet (0.4 mg total) under the tongue every 5 (five) minutes as needed for chest pain (up to 3 doses). 11/19/22  Yes Dunn, Dayna N, PA-C  omeprazole (PRILOSEC) 20 MG capsule Take 20 mg by mouth daily.   Yes  [provider]  ORGOVYX 120 MG tablet Take 120 mg by mouth daily. 05/19/22  Yes [provider]  prasugrel (EFFIENT) 10 MG TABS tablet Take 1 tablet (10 mg total) by mouth daily. 11/20/22  Yes Dunn, Dayna N, PA-C  REPATHA SURECLICK 140 MG/ML SOAJ Inject 140 mg into the skin every 14 (fourteen) days.  06/07/17  Yes [provider]  tolnaftate (TINACTIN) 1 % cream Apply 1 application topically daily as needed (jock itch).   Yes [provider]  XTANDI 40 MG capsule Take 160 mg by mouth daily.   Yes [provider]    Inpatient Medications: Scheduled Meds:  lidocaine  1 patch Transdermal Q24H   pantoprazole (PROTONIX) IV  40 mg Intravenous Q12H   Continuous Infusions:  sodium chloride 75 mL/hr at 12/01/22 1439   PRN Meds: acetaminophen, hydrALAZINE, ipratropium-albuterol, metoprolol tartrate, ondansetron  (ZOFRAN) IV, senna-docusate, traZODone  Allergies:    Allergies  Allergen Reactions   Ezetimibe Other (See Comments)    Muscle aches   Statins Other (See Comments)    Joint pain    Social History:   Social History   Socioeconomic History   Marital status: Married    Spouse name: geraldine   Number of children: 3   Years of education: some college   Highest education level: High school graduate  Occupational History   Occupation: retired  Tobacco Use   Smoking status: Former    Current packs/day: 0.00    Average packs/day: 1 pack/day for 30.0 years (30.0 ttl pk-yrs)    Types: Cigarettes    Start date: 12/16/1967    Quit date: 12/15/1997    Years since quitting: 24.9   Smokeless tobacco: Never  Vaping Use   Vaping status: Never Used  Substance and Sexual Activity   Alcohol use: Not Currently    Alcohol/week: 20.0 standard drinks of alcohol    Types: 20 Shots of liquor per week    Comment: OCCASIONAL, 12/28/17 stopped 11/05/17   Drug use: No   Sexual activity: Not on file  Other Topics Concern   Not on file  Social History Narrative   Lives with wife at home    caffeine use- occas, mostly decaf coffee   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    Social Determinants of Health   Financial Resource Strain: Low Risk  (11/18/2022)   Overall Financial Resource Strain (CARDIA)    Difficulty of Paying Living Expenses: Not hard at all  Food Insecurity: No Food Insecurity (12/01/2022)   Hunger Vital Sign    Worried About Running Out of Food in the Last Year: Never true    Ran Out of Food in the Last Year: Never true  Transportation Needs: No Transportation Needs (12/01/2022)   PRAPARE - Administrator, Civil Service (Medical): No    Lack of Transportation (Non-Medical): No  Physical Activity: Not on file  Stress: Not on file  Social Connections: Not on file  Intimate Partner Violence: Not At Risk (12/01/2022)   Humiliation, Afraid, Rape, and Kick questionnaire    Fear of Current or  Ex-Partner: No    Emotionally Abused: No    Physically Abused: No    Sexually Abused: No    Family History:    Family History  Problem Relation Age of Onset   Heart disease Mother    Heart disease Father    Cancer Sister        lung   Cancer Brother  prostate     ROS:  Constitutional: see HPI  Eyes: Denied vision change or loss Ears/Nose/Mouth/Throat: Denied ear ache, sore throat, coughing, sinus pain Cardiovascular: denied chest pain/pressure Respiratory: exertional shortness of breath Gastrointestinal: see HPI  Genital/Urinary: Denied dysuria, hematuria, urinary frequency/urgency Musculoskeletal: Denied muscle ache, joint pain, weakness Skin: Denied rash, wound Neuro: see HPI  Psych: Denied history of depression/anxiety  Endocrine: Denied history of diabetes   Physical Exam/Data:   Vitals:   12/01/22 1500 12/01/22 1709 12/01/22 1717 12/01/22 1732  BP: (!) 148/75  (!) 157/78 (!) 141/63  Pulse: 65  66 71  Resp: 17  18 18   Temp:  98.8 F (37.1 C) 97.8 F (36.6 C) 99.1 F (37.3 C)  TempSrc:  Oral Oral Oral  SpO2: 100%   100%   No intake or output data in the 24 hours ending 12/01/22 1814    11/26/2022   11:57 AM 11/19/2022    6:02 AM 11/18/2022   12:22 PM  Last 3 Weights  Weight (lbs) 207 lb 3.2 oz 212 lb 11.9 oz 215 lb 13.3 oz  Weight (kg) 93.985 kg 96.5 kg 97.9 kg     There is no height or weight on file to calculate BMI.   Vitals:  Vitals:   12/01/22 1717 12/01/22 1732  BP: (!) 157/78 (!) 141/63  Pulse: 66 71  Resp: 18 18  Temp: 97.8 F (36.6 C) 99.1 F (37.3 C)  SpO2:  100%   General Appearance: In no apparent distress, laying in bed HEENT: Left parietal staples for laceration noted, no active bleeding  Neck: Supple, trachea midline, no JVDs Cardiovascular: Regular rate, early heartbeat noted, normal S1-S2,  no murmur Respiratory: Resting breathing unlabored, lungs sounds clear to auscultation bilaterally, no use of accessory muscles. On  room air.  No wheezes, rales or rhonchi.   Gastrointestinal: Bowel sounds positive, abdomen soft, non-tender, non-distended.  Extremities: Able to move all extremities in bed without difficulty, no edema of BLE Musculoskeletal: Normal muscle bulk and tone Skin: Intact, warm, dry. No rashes. Right radial site with no bleeding/hematoma, 2+ pulse  Neurologic: Alert, oriented to person, place and time. No cognitive deficit, no gross focal neuro deficit Psychiatric: Normal affect. Mood is appropriate.    EKG:  The EKG was personally reviewed and demonstrates:    EKG today showed sinus rhythm 89 bpm, PVC, T wave abnormalities of lateral lead unchanged, no QT prolongation   Telemetry:  Telemetry was personally reviewed and demonstrates:    Sinus rhythm 60s, frequent PACs noted   Relevant CV Studies:   LHC on 11/16/22:  Diagnostic Dominance: Right  Intervention   Coronary intervention 11/16/2022: LM: 20% disease LAD: Prox-mid 95% calcific stenosis (culprit lesion)          Mid 30% disease Lcx: Prox 50% stenosis        OM1 50% stenosis RCA: Prox CTO. Left-to-right collaterals     Temporary transvenous pacemaker placement (removed at the end of the case) Intravascular ultrasound (IVUS) Successful percutaneous coronary intervention mid LAD        PTCA and stent placement 3.5 X 20 mm Synergy drug-eluting stent        Post dilatation using 3.75X12 and 4.0X12 mm [place] balloons up to 20 atm        Culprit lesion 95%--0% stenosis, TIMI flow I-->III, MSA 10.6 mm2        Loss of one septal branch with mild burning sensation. No conduction abnormality noted, in fact rate is  improved from 40 bpm to >60 bpm, not requiring any temporary pacing. Recommend overnight IV NTG.   Echo from 11/15/22:   1. Septal, apical distal anterior, mid/distal inferior wall hypokinesis .  Left ventricular ejection fraction, by estimation, is 40 to 45%. The left  ventricle has mildly decreased function. The left  ventricle demonstrates  regional wall motion  abnormalities (see scoring diagram/findings for description). The left  ventricular internal cavity size was mildly dilated. There is mild left  ventricular hypertrophy. Left ventricular diastolic parameters are  consistent with Grade I diastolic  dysfunction (impaired relaxation).   2. Right ventricular systolic function is normal. The right ventricular  size is normal. There is moderately elevated pulmonary artery systolic  pressure.   3. Left atrial size was mildly dilated.   4. The mitral valve is normal in structure. Mild mitral valve  regurgitation. No evidence of mitral stenosis.   5. The aortic valve is tricuspid. There is mild calcification of the  aortic valve. Aortic valve regurgitation is mild. Aortic valve sclerosis  is present, with no evidence of aortic valve stenosis.   6. The inferior vena cava is normal in size with greater than 50%  respiratory variability, suggesting right atrial pressure of 3 mmHg.     Laboratory Data:  High Sensitivity Troponin:   Recent Labs  Lab 11/14/22 1821 11/14/22 2035 11/15/22 0039 12/01/22 1132 12/01/22 1344  TROPONINIHS 144* 2,132* 5,707* 27* 29*     Chemistry Recent Labs  Lab 11/26/22 1251 12/01/22 1132  NA 132* 136  K 5.3* 5.0  CL 101 103  CO2 21* 20*  GLUCOSE 126* 149*  BUN 46* 43*  CREATININE 2.10* 2.23*  CALCIUM 9.9 10.2  GFRNONAA 31* 29*  ANIONGAP 10 13    Recent Labs  Lab 11/26/22 1251  PROT 7.9  ALBUMIN 3.7  AST 22  ALT 18  ALKPHOS 79  BILITOT 0.9   Lipids No results for input(s): "CHOL", "TRIG", "HDL", "LABVLDL", "LDLCALC", "CHOLHDL" in the last 168 hours.  Hematology Recent Labs  Lab 11/26/22 1251 12/01/22 1132  WBC 8.3 8.2  RBC 2.78* 2.38*  HGB 9.0* 7.5*  HCT 27.1* 23.3*  MCV 97.5 97.9  MCH 32.4 31.5  MCHC 33.2 32.2  RDW 12.9 13.1  PLT 284 292   Thyroid No results for input(s): "TSH", "FREET4" in the last 168 hours.  BNP Recent Labs   Lab 11/26/22 1253  BNP 205.5*    DDimer  Recent Labs  Lab 12/01/22 1147  DDIMER 1.82*     Radiology/Studies:  CT Head Wo Contrast  Result Date: 12/01/2022 CLINICAL DATA:  Patient fell off the bed hitting left side of his head on the floor. EXAM: CT HEAD WITHOUT CONTRAST CT CERVICAL SPINE WITHOUT CONTRAST TECHNIQUE: Multidetector CT imaging of the head and cervical spine was performed following the standard protocol without intravenous contrast. Multiplanar CT image reconstructions of the cervical spine were also generated. RADIATION DOSE REDUCTION: This exam was performed according to the departmental dose-optimization program which includes automated exposure control, adjustment of the mA and/or kV according to patient size and/or use of iterative reconstruction technique. COMPARISON:  Brain MRI 02/03/2015 FINDINGS: CT HEAD FINDINGS Brain: There is no evidence for acute hemorrhage, hydrocephalus, mass lesion, or abnormal extra-axial fluid collection. No definite CT evidence for acute infarction. Chronic lacunar infarct noted right basal ganglia. Vascular: No hyperdense vessel or unexpected calcification. Skull: No evidence for fracture. No worrisome lytic or sclerotic lesion. Sinuses/Orbits: The visualized paranasal sinuses and mastoid  air cells are clear. Visualized portions of the globes and intraorbital fat are unremarkable. Other: None. CT CERVICAL SPINE FINDINGS Alignment: Straightening of normal cervical lordosis. No subluxation. Skull base and vertebrae: No acute fracture. No primary bone lesion or focal pathologic process. Soft tissues and spinal canal: No prevertebral fluid or swelling. No visible canal hematoma. Disc levels: Intervertebral disc spaces are preserved. The facets are well aligned bilaterally. Upper chest: Unremarkable. Other: None. IMPRESSION: 1. No acute intracranial abnormality. 2. Chronic lacunar infarct right basal ganglia. 3. Loss of cervical lordosis. This can be  related to patient positioning, muscle spasm or soft tissue injury. Electronically Signed   By: Kennith Center M.D.   On: 12/01/2022 13:03   CT Cervical Spine Wo Contrast  Result Date: 12/01/2022 CLINICAL DATA:  Patient fell off the bed hitting left side of his head on the floor. EXAM: CT HEAD WITHOUT CONTRAST CT CERVICAL SPINE WITHOUT CONTRAST TECHNIQUE: Multidetector CT imaging of the head and cervical spine was performed following the standard protocol without intravenous contrast. Multiplanar CT image reconstructions of the cervical spine were also generated. RADIATION DOSE REDUCTION: This exam was performed according to the departmental dose-optimization program which includes automated exposure control, adjustment of the mA and/or kV according to patient size and/or use of iterative reconstruction technique. COMPARISON:  Brain MRI 02/03/2015 FINDINGS: CT HEAD FINDINGS Brain: There is no evidence for acute hemorrhage, hydrocephalus, mass lesion, or abnormal extra-axial fluid collection. No definite CT evidence for acute infarction. Chronic lacunar infarct noted right basal ganglia. Vascular: No hyperdense vessel or unexpected calcification. Skull: No evidence for fracture. No worrisome lytic or sclerotic lesion. Sinuses/Orbits: The visualized paranasal sinuses and mastoid air cells are clear. Visualized portions of the globes and intraorbital fat are unremarkable. Other: None. CT CERVICAL SPINE FINDINGS Alignment: Straightening of normal cervical lordosis. No subluxation. Skull base and vertebrae: No acute fracture. No primary bone lesion or focal pathologic process. Soft tissues and spinal canal: No prevertebral fluid or swelling. No visible canal hematoma. Disc levels: Intervertebral disc spaces are preserved. The facets are well aligned bilaterally. Upper chest: Unremarkable. Other: None. IMPRESSION: 1. No acute intracranial abnormality. 2. Chronic lacunar infarct right basal ganglia. 3. Loss of cervical  lordosis. This can be related to patient positioning, muscle spasm or soft tissue injury. Electronically Signed   By: Kennith Center M.D.   On: 12/01/2022 13:03   DG Chest Portable 1 View  Result Date: 12/01/2022 CLINICAL DATA:  Shortness of breath. EXAM: PORTABLE CHEST 1 VIEW COMPARISON:  11/14/2022 FINDINGS: The lungs are clear without focal pneumonia, edema, pneumothorax or pleural effusion. Cardiopericardial silhouette is at upper limits of normal for size. No acute bony abnormality. Telemetry leads overlie the chest. IMPRESSION: No active disease. Electronically Signed   By: Kennith Center M.D.   On: 12/01/2022 12:58     Assessment and Plan:   CAD with  prox LAD DES placed 11/16/22 Elevated troponin  - Hs trop 27 >29, down from 5707 on 11/15/22, not consistent with ACS  - EKG no acute ischemic change - he has no chest pain, had tolerated activity at home without angina until this morning with increased exertional SOB and lightheadedness leading to syncope  - syncope is likely due to symptomatic anemia, Hgb has dropped from 11.8 (on 07/31/22)  to 7.5, AKI likely contributing as well, would transfuse to keep Hgb >8 given CAD  - it's absolute important to continue DAPT given less than a month old PCI to prox LAD, however,  we are forced to stop Effient due to active GI bleed and anemia at this time (last dose effient was 11/30/22 morning), he needs GI scope as soon as possible for recommendation on when to resume antiplatelet safely,  note drug-drug interaction between Brilinta and Xtandi,  intervention team choose Effient over Plavix last admission due to clot burden and residual disease, pending on GI scope finding, we may have to choose Plavix over effient going forward.  D/w GI, OK with continuing aspirin, will continue - not on BB due to hx of bradycardia, losartan hold for AKI, on PTA Repatha   Ischemic cardiomyopathy Chronic systolic heart failure  - SOB likely due to symptomatic anemia - LVEF  was down 40-45% on Echo 11/15/22  - clinically he is dehydrated today, agree with IVF hydration  - GDMT: Hold losartan given AKI on CKD IIIb, if BP remains elevated, may add hydralazine for BP control; no beta blocker due to hx of bradycardia needing TVP support during cath; no ARNI/MRA/SGLT2I due to AKI now.  - AHF following, cardiac MRI and genetic testing were requested for infiltrative work up, follow up as scheduled   Acute blood loss symptomatic anemia 2/2 GI bleed Syncope  AKI on CKD IIIb  MGUS Prostate cancer  - per primary team     Risk Assessment/Risk Scores:     New York Heart Association (NYHA) Functional Class NYHA Class II        For questions or updates, please contact [facility] Please consult [email or web address] for contact info under    Signed, Ricky Bender, NP  12/01/2022 6:14 PM   Patient seen and examined.  Agree with above documentation.  Ricky Rowland is a 77 year old male with a history of CAD status post LAD stent 11/16/2022, prostate cancer on Xtandi, MGUS, CVA who are consulted for evaluation of management of antiplatelets in setting of GI bleed.  He was hospitalized 9/6 through 11/19/2022 with NSTEMI.  LHC on 11/16/2022 showed 95% proximal mid LAD stenosis status post DES; also noted to have 20% left main, 30% mid LAD, 50% proximal LCx, 50% OM1, CTO proximal RCA.  He was not started on Brilinta given interaction with Xtandi; he was instead started on Effient and aspirin.  Echocardiogram showed EF 40 to 45%.  He presented to the ED today after syncopal episode.  He reports has been having dark stools since 9/11.  In the ED, vital signs notable for BP 132/70, pulse 77, SpO2 100% on room air.  Labs notable for hemoglobin 7.5 (down from 9.0 on 11/26/2022), troponin 27 > 29, creatinine 2.2 (2.1 on 9/18).  EKG shows normal sinus rhythm, rate 89, left axis deviation, nonspecific T wave flattening.  On exam, patient is alert and oriented, regular rate and rhythm, no  murmurs, lungs CTAB, no LE edema or JVD.  Complicated situation given concern for GI bleed in setting of recent coronary stenting.  Recommend urgent GI evaluation and restarting antiplatelets as soon as okay from GI standpoint.  He was started on Effient instead of Brilinta due to interaction with Xtandi, would likely plan to switch to Plavix moving forward.  Will not hold aspirin, OK per GI.  Little Ishikawa, MD

## 2022-12-02 ENCOUNTER — Encounter (HOSPITAL_COMMUNITY): Payer: Self-pay | Admitting: Internal Medicine

## 2022-12-02 ENCOUNTER — Encounter (HOSPITAL_COMMUNITY)
Admission: EM | Disposition: A | Discharge status: Home / Self Care | Payer: Self-pay | Source: Home / Self Care | Attending: Internal Medicine

## 2022-12-02 ENCOUNTER — Inpatient Hospital Stay (HOSPITAL_COMMUNITY): Payer: Medicare Other | Admitting: Registered Nurse

## 2022-12-02 DIAGNOSIS — I5022 Chronic systolic (congestive) heart failure: Secondary | ICD-10-CM

## 2022-12-02 DIAGNOSIS — K922 Gastrointestinal hemorrhage, unspecified: Secondary | ICD-10-CM

## 2022-12-02 DIAGNOSIS — Z87891 Personal history of nicotine dependence: Secondary | ICD-10-CM | POA: Diagnosis not present

## 2022-12-02 DIAGNOSIS — I110 Hypertensive heart disease with heart failure: Secondary | ICD-10-CM | POA: Diagnosis not present

## 2022-12-02 DIAGNOSIS — K2970 Gastritis, unspecified, without bleeding: Secondary | ICD-10-CM

## 2022-12-02 DIAGNOSIS — I25118 Atherosclerotic heart disease of native coronary artery with other forms of angina pectoris: Secondary | ICD-10-CM | POA: Diagnosis not present

## 2022-12-02 HISTORY — PX: ESOPHAGOGASTRODUODENOSCOPY (EGD) WITH PROPOFOL: SHX5813

## 2022-12-02 LAB — BASIC METABOLIC PANEL
Anion gap: 10 (ref 5–15)
BUN: 36 mg/dL — ABNORMAL HIGH (ref 8–23)
CO2: 19 mmol/L — ABNORMAL LOW (ref 22–32)
Calcium: 9.3 mg/dL (ref 8.9–10.3)
Chloride: 106 mmol/L (ref 98–111)
Creatinine, Ser: 1.9 mg/dL — ABNORMAL HIGH (ref 0.61–1.24)
GFR, Estimated: 35 mL/min — ABNORMAL LOW (ref >60)
Glucose, Bld: 137 mg/dL — ABNORMAL HIGH (ref 70–99)
Potassium: 4 mmol/L (ref 3.5–5.1)
Sodium: 135 mmol/L (ref 135–145)

## 2022-12-02 LAB — GLUCOSE, CAPILLARY
Glucose-Capillary: 108 mg/dL — ABNORMAL HIGH (ref 70–99)
Glucose-Capillary: 129 mg/dL — ABNORMAL HIGH (ref 70–99)

## 2022-12-02 LAB — CBC
HCT: 24.7 % — ABNORMAL LOW (ref 39.0–52.0)
Hemoglobin: 8.2 g/dL — ABNORMAL LOW (ref 13.0–17.0)
MCH: 32.8 pg (ref 26.0–34.0)
MCHC: 33.2 g/dL (ref 30.0–36.0)
MCV: 98.8 fL (ref 80.0–100.0)
Platelets: 226 10*3/uL (ref 150–400)
RBC: 2.5 MIL/uL — ABNORMAL LOW (ref 4.22–5.81)
RDW: 14 % (ref 11.5–15.5)
WBC: 7.3 10*3/uL (ref 4.0–10.5)
nRBC: 0 % (ref 0.0–0.2)

## 2022-12-02 LAB — FOLATE: Folate: 20.5 ng/mL (ref >5.9)

## 2022-12-02 LAB — PROTIME-INR
INR: 1 (ref 0.8–1.2)
Prothrombin Time: 13.7 seconds (ref 11.4–15.2)

## 2022-12-02 LAB — IRON AND TIBC
Iron: 89 ug/dL (ref 45–182)
Saturation Ratios: 21 % (ref 17.9–39.5)
TIBC: 430 ug/dL (ref 250–450)
UIBC: 341 ug/dL

## 2022-12-02 LAB — RETICULOCYTES
Immature Retic Fract: 28.1 % — ABNORMAL HIGH (ref 2.3–15.9)
RBC.: 2.53 MIL/uL — ABNORMAL LOW (ref 4.22–5.81)
Retic Count, Absolute: 88 10*3/uL (ref 19.0–186.0)
Retic Ct Pct: 3.5 % — ABNORMAL HIGH (ref 0.4–3.1)

## 2022-12-02 LAB — TSH: TSH: 2.87 u[IU]/mL (ref 0.350–4.500)

## 2022-12-02 LAB — MAGNESIUM: Magnesium: 2 mg/dL (ref 1.7–2.4)

## 2022-12-02 LAB — FERRITIN: Ferritin: 37 ng/mL (ref 24–336)

## 2022-12-02 LAB — VITAMIN B12: Vitamin B-12: 231 pg/mL (ref 180–914)

## 2022-12-02 LAB — PHOSPHORUS: Phosphorus: 4.7 mg/dL — ABNORMAL HIGH (ref 2.5–4.6)

## 2022-12-02 SURGERY — ESOPHAGOGASTRODUODENOSCOPY (EGD) WITH PROPOFOL
Anesthesia: Monitor Anesthesia Care

## 2022-12-02 MED ORDER — LIDOCAINE 2% (20 MG/ML) 5 ML SYRINGE
INTRAMUSCULAR | Status: DC | PRN
  Administered 2022-12-02: 80 mg via INTRAVENOUS

## 2022-12-02 MED ORDER — CLOPIDOGREL BISULFATE 75 MG PO TABS
75.0000 mg | ORAL_TABLET | Freq: Every day | ORAL | Status: DC
  Administered 2022-12-03 – 2022-12-05 (×3): 75 mg via ORAL
  Filled 2022-12-02 (×3): qty 1

## 2022-12-02 MED ORDER — PHENYLEPHRINE HCL-NACL 20-0.9 MG/250ML-% IV SOLN
INTRAVENOUS | Status: DC | PRN
Start: 2022-12-02 — End: 2022-12-02
  Administered 2022-12-02: 80 ug via INTRAVENOUS
  Administered 2022-12-02 (×2): 160 ug via INTRAVENOUS

## 2022-12-02 MED ORDER — CLOPIDOGREL BISULFATE 75 MG PO TABS
300.0000 mg | ORAL_TABLET | Freq: Once | ORAL | Status: AC
  Administered 2022-12-02: 300 mg via ORAL
  Filled 2022-12-02: qty 4

## 2022-12-02 MED ORDER — PROPOFOL 500 MG/50ML IV EMUL
INTRAVENOUS | Status: DC | PRN
  Administered 2022-12-02: 120 ug/kg/min via INTRAVENOUS

## 2022-12-02 MED ORDER — PROPOFOL 10 MG/ML IV BOLUS
INTRAVENOUS | Status: DC | PRN
  Administered 2022-12-02: 50 mg via INTRAVENOUS
  Administered 2022-12-02: 30 mg via INTRAVENOUS

## 2022-12-02 MED ORDER — GLYCOPYRROLATE PF 0.2 MG/ML IJ SOSY
PREFILLED_SYRINGE | INTRAMUSCULAR | Status: DC | PRN
  Administered 2022-12-02: .1 mg via INTRAVENOUS

## 2022-12-02 SURGICAL SUPPLY — 15 items

## 2022-12-02 NOTE — Progress Notes (Signed)
OT Cancellation Note  Patient Details Name: Ricky MARATEA Sr. MRN: [id number] DOB: 23-Feb-1946   Cancelled Treatment:    Reason Eval/Treat Not Completed: OT screened, no needs identified, will sign off (Discussed with case with PT, pt does not have acute OT needs. Will sign off, thank you.)  Donia Pounds 12/02/2022, 12:08 PM

## 2022-12-02 NOTE — Progress Notes (Signed)
[facility] medication was brought by wife. MD was notified and will not be ordering the medication yet. MD preferred to keep the medication incase they decided to resume med. Medications were sent to Pharmacy for safe keeping, Xtandi and Orgovyx 1 bottle each.

## 2022-12-02 NOTE — Progress Notes (Signed)
PT Cancellation Note  Patient Details Name: Ricky POSTHUMA Sr. MRN: [id number] DOB: 04/28/1945   Cancelled Treatment:    Reason Eval/Treat Not Completed: Patient at procedure or test/unavailable  Patient off the floor for procedure. Will continue efforts as medically appropriate.    Jerolyn Center, PT Acute Rehabilitation Services  Office [phone]   Ricky Rowland 12/02/2022, 8:42 AM

## 2022-12-02 NOTE — Transfer of Care (Signed)
Immediate Anesthesia Transfer of Care Note  Patient: Ricky Rowland.  Procedure(s) Performed: ESOPHAGOGASTRODUODENOSCOPY (EGD) WITH PROPOFOL  Patient Location: PACU  Anesthesia Type:MAC  Level of Consciousness: awake  Airway & Oxygen Therapy: Patient Spontanous Breathing  Post-op Assessment: Report given to RN and Post -op Vital signs reviewed and stable  Post vital signs: Reviewed and stable  Last Vitals:  Vitals Value Taken Time  BP 92/56 12/02/22 0915  Temp    Pulse 71 12/02/22 0916  Resp 16 12/02/22 0916  SpO2 100 % 12/02/22 0916  Vitals shown include unfiled device data.  Last Pain:  Vitals:   12/02/22 0834  TempSrc: Temporal  PainSc: 0-No pain         Complications: No notable events documented.

## 2022-12-02 NOTE — Anesthesia Postprocedure Evaluation (Signed)
Anesthesia Post Note  Patient: ABIODUN BECKIUS Sr.  Procedure(s) Performed: ESOPHAGOGASTRODUODENOSCOPY (EGD) WITH PROPOFOL     Patient location during evaluation: PACU Anesthesia Type: MAC Level of consciousness: awake and alert Pain management: pain level controlled Vital Signs Assessment: post-procedure vital signs reviewed and stable Respiratory status: spontaneous breathing, nonlabored ventilation and respiratory function stable Cardiovascular status: blood pressure returned to baseline and stable Postop Assessment: no apparent nausea or vomiting Anesthetic complications: no   No notable events documented.  Last Vitals:  Vitals:   12/02/22 1000 12/02/22 1140  BP: (!) 150/74 (!) 165/71  Pulse:  (!) 56  Resp:  18  Temp:  36.8 C  SpO2:      Last Pain:  Vitals:   12/02/22 1140  TempSrc: Oral  PainSc:                  Lannie Fields

## 2022-12-02 NOTE — Consult Note (Signed)
Referring Provider: Dr. Thedore Mins Primary Care Physician:  [name], MD Primary Gastroenterologist:  Dr. Bosie Clos  Reason for Consultation:  Melena  HPI: Ricky Rowland. is a 77 y.o. male who was just discharged earlier this month following an NSTEMI and coronary stent placement and placed on Effient. Following discharge he developed constipation until 4 days ago when he started having black tarry stools daily. Felt lightheaded yesterday and passed out after walking his dogs and received a laceration to the head. Denies abdominal pain, N/V. EGD in 2019 showed a small hyperplastic gastric polyp on biopsy. Colonoscopy in 2022 for history of adenomatous polyps showed hemorrhoids and diverticulosis. Denies NSAIDs.  Past Medical History:  Diagnosis Date   Borderline diabetes    DIET CONTROLLED   Dyslipidemia    Elevated PSA    External carotid artery stenosis    MILD-- BILATERAL   Fall 11/05/2017   GERD (gastroesophageal reflux disease)    Gout, arthritis    Hearing loss in left ear    Heart murmur    MILD --  ASYMPTOMATIC   History of gastric ulcer    REMOTE HX   Hypertension    Pre-diabetes    Prostate cancer (HCC) 12/21/2012   Gleason 3+4=7   Seizure-like activity (HCC)    Syncope and collapse    Wears glasses     Past Surgical History:  Procedure Laterality Date   CORONARY STENT INTERVENTION N/A 11/16/2022   Procedure: CORONARY STENT INTERVENTION;  Surgeon: Elder Negus, MD;  Location: [facility];  Service: Cardiovascular;  Laterality: N/A;   CORONARY ULTRASOUND/IVUS N/A 11/16/2022   Procedure: Coronary Ultrasound/IVUS;  Surgeon: Elder Negus, MD;  Location: [facility];  Service: Cardiovascular;  Laterality: N/A;   KNEE ARTHROSCOPY W/ MENISCAL REPAIR Left 03/10/2012   LEFT HEART CATH AND CORONARY ANGIOGRAPHY N/A 11/16/2022   Procedure: LEFT HEART CATH AND CORONARY ANGIOGRAPHY;  Surgeon: Elder Negus, MD;  Location: [facility];   Service: Cardiovascular;  Laterality: N/A;   LOOP RECORDER INSERTION N/A 01/05/2018   Procedure: LOOP RECORDER INSERTION;  Surgeon: Duke Salvia, MD;  Location: [facility];  Service: Cardiovascular;  Laterality: N/A;   LUMBAR SPINE SURGERY  03/10/1989   PROSTATE BIOPSY N/A 12/21/2012   Procedure: BIOPSY TRANSRECTAL ULTRASONIC PROSTATE (TUBP);  Surgeon: Lindaann Slough, MD;  Location: [facility];  Service: Urology;  Laterality: N/A;   PROSTATE BIOPSY  11/26/2011   benign   TEMPORARY PACEMAKER N/A 11/16/2022   Procedure: TEMPORARY PACEMAKER;  Surgeon: Elder Negus, MD;  Location: [facility];  Service: Cardiovascular;  Laterality: N/A;   TONSILLECTOMY     WISDOM TOOTH EXTRACTION      Prior to Admission medications   Medication Sig Start Date End Date Taking? Authorizing Provider  acetaminophen (TYLENOL) 650 MG CR tablet Take 650 mg by mouth every 8 (eight) hours as needed for pain.   Yes [provider]  allopurinol (ZYLOPRIM) 300 MG tablet Take 300 mg by mouth daily.   Yes [provider]  aspirin EC 81 MG tablet Take 1 tablet (81 mg total) by mouth daily. Swallow whole. 11/19/22  Yes Dunn, Dayna N, PA-C  Multiple Vitamins-Minerals (MULTIVITAMIN ADULT EXTRA C PO) Take 1 tablet by mouth daily. 07/24/22  Yes [provider]  Naphazoline HCl (CLEAR EYES OP) Place 1 drop into both eyes daily as needed (dry eyes).    Yes [provider]  nitroGLYCERIN (NITROSTAT)  0.4 MG SL tablet Place 1 tablet (0.4 mg total) under the tongue every 5 (five) minutes as needed for chest pain (up to 3 doses). 11/19/22  Yes Dunn, Dayna N, PA-C  omeprazole (PRILOSEC) 20 MG capsule Take 20 mg by mouth daily.   Yes [provider]  ORGOVYX 120 MG tablet Take 120 mg by mouth daily. 05/19/22  Yes [provider]  prasugrel (EFFIENT) 10 MG TABS tablet Take 1 tablet (10 mg total) by mouth daily. 11/20/22  Yes Dunn, Dayna N, PA-C  REPATHA  SURECLICK 140 MG/ML SOAJ Inject 140 mg into the skin every 14 (fourteen) days.  06/07/17  Yes [provider]  tolnaftate (TINACTIN) 1 % cream Apply 1 application topically daily as needed (jock itch).   Yes [provider]  XTANDI 40 MG capsule Take 160 mg by mouth daily.   Yes [provider]    Scheduled Meds:  [MAR Hold] aspirin EC  81 mg Oral Daily   [MAR Hold] lidocaine  1 patch Transdermal Q24H   [MAR Hold] pantoprazole (PROTONIX) IV  40 mg Intravenous Q12H   Continuous Infusions:  sodium chloride 75 mL/hr at 12/02/22 0515   PRN Meds:.[MAR Hold] acetaminophen, [MAR Hold] hydrALAZINE, [MAR Hold] ipratropium-albuterol, [MAR Hold] ondansetron (ZOFRAN) IV, [MAR Hold] senna-docusate, [MAR Hold] traZODone  Allergies as of 12/01/2022 - Review Complete 12/01/2022  Allergen Reaction Noted   Ezetimibe Other (See Comments) 07/24/2020   Statins Other (See Comments) 09/07/2015    Family History  Problem Relation Age of Onset   Heart disease Mother    Heart disease Father    Cancer Sister        lung   Cancer Brother        prostate    Social History   Socioeconomic History   Marital status: Married    Spouse name: geraldine   Number of children: 3   Years of education: some college   Highest education level: High school graduate  Occupational History   Occupation: retired  Tobacco Use   Smoking status: Former    Current packs/day: 0.00    Average packs/day: 1 pack/day for 30.0 years (30.0 ttl pk-yrs)    Types: Cigarettes    Start date: 12/16/1967    Quit date: 12/15/1997    Years since quitting: 24.9   Smokeless tobacco: Never  Vaping Use   Vaping status: Never Used  Substance and Sexual Activity   Alcohol use: Not Currently    Alcohol/week: 20.0 standard drinks of alcohol    Types: 20 Shots of liquor per week    Comment: OCCASIONAL, 12/28/17 stopped 11/05/17   Drug use: No   Sexual activity: Not on file  Other Topics Concern   Not on file   Social History Narrative   Lives with wife at home    caffeine use- occas, mostly decaf coffee   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    Social Determinants of Health   Financial Resource Strain: Low Risk  (11/18/2022)   Overall Financial Resource Strain (CARDIA)    Difficulty of Paying Living Expenses: Not hard at all  Food Insecurity: No Food Insecurity (12/01/2022)   Hunger Vital Sign    Worried About Running Out of Food in the Last Year: Never true    Ran Out of Food in the Last Year: Never true  Transportation Needs: No Transportation Needs (12/01/2022)   PRAPARE - Administrator, Civil Service (Medical): No    Lack of Transportation (Non-Medical): No  Physical Activity: Not on file  Stress: Not on file  Social Connections: Not on file  Intimate Partner Violence: Not At Risk (12/01/2022)   Humiliation, Afraid, Rape, and Kick questionnaire    Fear of Current or Ex-Partner: No    Emotionally Abused: No    Physically Abused: No    Sexually Abused: No    Review of Systems: All negative except as stated above in HPI.  Physical Exam: Vital signs: Vitals:   12/02/22 0805 12/02/22 0834  BP: (!) 142/68 (!) 154/72  Pulse: 62   Resp: 14 14  Temp: 98.5 F (36.9 C) (!) 97.5 F (36.4 C)  SpO2:  100%     General:   Lethargic, Well-developed, well-nourished, pleasant and cooperative in NAD Head: normocephalic, atraumatic Eyes: anicteric sclera ENT: oropharynx clear Neck: supple, nontender Lungs:  Clear throughout to auscultation.   No wheezes, crackles, or rhonchi. No acute distress. Heart:  Regular rate and rhythm; no murmurs, clicks, rubs,  or gallops. Abdomen: soft, nontender, nondistended, +BS  Rectal:  Deferred Ext: no edema  GI:  Lab Results: Recent Labs    12/01/22 1132 12/02/22 0438  WBC 8.2 7.3  HGB 7.5* 8.2*  HCT 23.3* 24.7*  PLT 292 226   BMET Recent Labs    12/01/22 1132 12/02/22 0438  NA 136 135  K 5.0 4.0  CL 103 106  CO2 20* 19*  GLUCOSE 149* 137*   BUN 43* 36*  CREATININE 2.23* 1.90*  CALCIUM 10.2 9.3   LFT No results for input(s): "PROT", "ALBUMIN", "AST", "ALT", "ALKPHOS", "BILITOT", "BILIDIR", "IBILI" in the last 72 hours. PT/INR Recent Labs    12/02/22 0656  LABPROT 13.7  INR 1.0     Studies/Results: CT Head Wo Contrast  Result Date: 12/01/2022 CLINICAL DATA:  Patient fell off the bed hitting left side of his head on the floor. EXAM: CT HEAD WITHOUT CONTRAST CT CERVICAL SPINE WITHOUT CONTRAST TECHNIQUE: Multidetector CT imaging of the head and cervical spine was performed following the standard protocol without intravenous contrast. Multiplanar CT image reconstructions of the cervical spine were also generated. RADIATION DOSE REDUCTION: This exam was performed according to the departmental dose-optimization program which includes automated exposure control, adjustment of the mA and/or kV according to patient size and/or use of iterative reconstruction technique. COMPARISON:  Brain MRI 02/03/2015 FINDINGS: CT HEAD FINDINGS Brain: There is no evidence for acute hemorrhage, hydrocephalus, mass lesion, or abnormal extra-axial fluid collection. No definite CT evidence for acute infarction. Chronic lacunar infarct noted right basal ganglia. Vascular: No hyperdense vessel or unexpected calcification. Skull: No evidence for fracture. No worrisome lytic or sclerotic lesion. Sinuses/Orbits: The visualized paranasal sinuses and mastoid air cells are clear. Visualized portions of the globes and intraorbital fat are unremarkable. Other: None. CT CERVICAL SPINE FINDINGS Alignment: Straightening of normal cervical lordosis. No subluxation. Skull base and vertebrae: No acute fracture. No primary bone lesion or focal pathologic process. Soft tissues and spinal canal: No prevertebral fluid or swelling. No visible canal hematoma. Disc levels: Intervertebral disc spaces are preserved. The facets are well aligned bilaterally. Upper chest: Unremarkable.  Other: None. IMPRESSION: 1. No acute intracranial abnormality. 2. Chronic lacunar infarct right basal ganglia. 3. Loss of cervical lordosis. This can be related to patient positioning, muscle spasm or soft tissue injury. Electronically Signed   By: Kennith Center M.D.   On: 12/01/2022 13:03   CT Cervical Spine Wo Contrast  Result Date: 12/01/2022 CLINICAL DATA:  Patient fell off the bed hitting  left side of his head on the floor. EXAM: CT HEAD WITHOUT CONTRAST CT CERVICAL SPINE WITHOUT CONTRAST TECHNIQUE: Multidetector CT imaging of the head and cervical spine was performed following the standard protocol without intravenous contrast. Multiplanar CT image reconstructions of the cervical spine were also generated. RADIATION DOSE REDUCTION: This exam was performed according to the departmental dose-optimization program which includes automated exposure control, adjustment of the mA and/or kV according to patient size and/or use of iterative reconstruction technique. COMPARISON:  Brain MRI 02/03/2015 FINDINGS: CT HEAD FINDINGS Brain: There is no evidence for acute hemorrhage, hydrocephalus, mass lesion, or abnormal extra-axial fluid collection. No definite CT evidence for acute infarction. Chronic lacunar infarct noted right basal ganglia. Vascular: No hyperdense vessel or unexpected calcification. Skull: No evidence for fracture. No worrisome lytic or sclerotic lesion. Sinuses/Orbits: The visualized paranasal sinuses and mastoid air cells are clear. Visualized portions of the globes and intraorbital fat are unremarkable. Other: None. CT CERVICAL SPINE FINDINGS Alignment: Straightening of normal cervical lordosis. No subluxation. Skull base and vertebrae: No acute fracture. No primary bone lesion or focal pathologic process. Soft tissues and spinal canal: No prevertebral fluid or swelling. No visible canal hematoma. Disc levels: Intervertebral disc spaces are preserved. The facets are well aligned bilaterally. Upper  chest: Unremarkable. Other: None. IMPRESSION: 1. No acute intracranial abnormality. 2. Chronic lacunar infarct right basal ganglia. 3. Loss of cervical lordosis. This can be related to patient positioning, muscle spasm or soft tissue injury. Electronically Signed   By: Kennith Center M.D.   On: 12/01/2022 13:03   DG Chest Portable 1 View  Result Date: 12/01/2022 CLINICAL DATA:  Shortness of breath. EXAM: PORTABLE CHEST 1 VIEW COMPARISON:  11/14/2022 FINDINGS: The lungs are clear without focal pneumonia, edema, pneumothorax or pleural effusion. Cardiopericardial silhouette is at upper limits of normal for size. No acute bony abnormality. Telemetry leads overlie the chest. IMPRESSION: No active disease. Electronically Signed   By: Kennith Center M.D.   On: 12/01/2022 12:58    Impression/Plan: GI bleed with melena concerning for peptic ulcer bleed on Effient. Anticoagulation on hold. EGD this morning. Supportive care. PPI IV Q 12 hours.    LOS: 1 day   Shirley Friar  12/02/2022, 8:47 AM  Questions please call [phone]

## 2022-12-02 NOTE — H&P (View-Only) (Signed)
Referring Provider: Dr. Thedore Mins Primary Care Physician:  [name], MD Primary Gastroenterologist:  Dr. Bosie Clos  Reason for Consultation:  Melena  HPI: Ricky Rowland. is a 77 y.o. male who was just discharged earlier this month following an NSTEMI and coronary stent placement and placed on Effient. Following discharge he developed constipation until 4 days ago when he started having black tarry stools daily. Felt lightheaded yesterday and passed out after walking his dogs and received a laceration to the head. Denies abdominal pain, N/V. EGD in 2019 showed a small hyperplastic gastric polyp on biopsy. Colonoscopy in 2022 for history of adenomatous polyps showed hemorrhoids and diverticulosis. Denies NSAIDs.  Past Medical History:  Diagnosis Date   Borderline diabetes    DIET CONTROLLED   Dyslipidemia    Elevated PSA    External carotid artery stenosis    MILD-- BILATERAL   Fall 11/05/2017   GERD (gastroesophageal reflux disease)    Gout, arthritis    Hearing loss in left ear    Heart murmur    MILD --  ASYMPTOMATIC   History of gastric ulcer    REMOTE HX   Hypertension    Pre-diabetes    Prostate cancer (HCC) 12/21/2012   Gleason 3+4=7   Seizure-like activity (HCC)    Syncope and collapse    Wears glasses     Past Surgical History:  Procedure Laterality Date   CORONARY STENT INTERVENTION N/A 11/16/2022   Procedure: CORONARY STENT INTERVENTION;  Surgeon: Elder Negus, MD;  Location: [facility];  Service: Cardiovascular;  Laterality: N/A;   CORONARY ULTRASOUND/IVUS N/A 11/16/2022   Procedure: Coronary Ultrasound/IVUS;  Surgeon: Elder Negus, MD;  Location: [facility];  Service: Cardiovascular;  Laterality: N/A;   KNEE ARTHROSCOPY W/ MENISCAL REPAIR Left 03/10/2012   LEFT HEART CATH AND CORONARY ANGIOGRAPHY N/A 11/16/2022   Procedure: LEFT HEART CATH AND CORONARY ANGIOGRAPHY;  Surgeon: Elder Negus, MD;  Location: [facility];   Service: Cardiovascular;  Laterality: N/A;   LOOP RECORDER INSERTION N/A 01/05/2018   Procedure: LOOP RECORDER INSERTION;  Surgeon: Duke Salvia, MD;  Location: [facility];  Service: Cardiovascular;  Laterality: N/A;   LUMBAR SPINE SURGERY  03/10/1989   PROSTATE BIOPSY N/A 12/21/2012   Procedure: BIOPSY TRANSRECTAL ULTRASONIC PROSTATE (TUBP);  Surgeon: Lindaann Slough, MD;  Location: [facility];  Service: Urology;  Laterality: N/A;   PROSTATE BIOPSY  11/26/2011   benign   TEMPORARY PACEMAKER N/A 11/16/2022   Procedure: TEMPORARY PACEMAKER;  Surgeon: Elder Negus, MD;  Location: [facility];  Service: Cardiovascular;  Laterality: N/A;   TONSILLECTOMY     WISDOM TOOTH EXTRACTION      Prior to Admission medications   Medication Sig Start Date End Date Taking? Authorizing Provider  acetaminophen (TYLENOL) 650 MG CR tablet Take 650 mg by mouth every 8 (eight) hours as needed for pain.   Yes [provider]  allopurinol (ZYLOPRIM) 300 MG tablet Take 300 mg by mouth daily.   Yes [provider]  aspirin EC 81 MG tablet Take 1 tablet (81 mg total) by mouth daily. Swallow whole. 11/19/22  Yes Dunn, Dayna N, PA-C  Multiple Vitamins-Minerals (MULTIVITAMIN ADULT EXTRA C PO) Take 1 tablet by mouth daily. 07/24/22  Yes [provider]  Naphazoline HCl (CLEAR EYES OP) Place 1 drop into both eyes daily as needed (dry eyes).    Yes [provider]  nitroGLYCERIN (NITROSTAT)  0.4 MG SL tablet Place 1 tablet (0.4 mg total) under the tongue every 5 (five) minutes as needed for chest pain (up to 3 doses). 11/19/22  Yes Dunn, Dayna N, PA-C  omeprazole (PRILOSEC) 20 MG capsule Take 20 mg by mouth daily.   Yes [provider]  ORGOVYX 120 MG tablet Take 120 mg by mouth daily. 05/19/22  Yes [provider]  prasugrel (EFFIENT) 10 MG TABS tablet Take 1 tablet (10 mg total) by mouth daily. 11/20/22  Yes Dunn, Dayna N, PA-C  REPATHA  SURECLICK 140 MG/ML SOAJ Inject 140 mg into the skin every 14 (fourteen) days.  06/07/17  Yes [provider]  tolnaftate (TINACTIN) 1 % cream Apply 1 application topically daily as needed (jock itch).   Yes [provider]  XTANDI 40 MG capsule Take 160 mg by mouth daily.   Yes [provider]    Scheduled Meds:  [MAR Hold] aspirin EC  81 mg Oral Daily   [MAR Hold] lidocaine  1 patch Transdermal Q24H   [MAR Hold] pantoprazole (PROTONIX) IV  40 mg Intravenous Q12H   Continuous Infusions:  sodium chloride 75 mL/hr at 12/02/22 0515   PRN Meds:.[MAR Hold] acetaminophen, [MAR Hold] hydrALAZINE, [MAR Hold] ipratropium-albuterol, [MAR Hold] ondansetron (ZOFRAN) IV, [MAR Hold] senna-docusate, [MAR Hold] traZODone  Allergies as of 12/01/2022 - Review Complete 12/01/2022  Allergen Reaction Noted   Ezetimibe Other (See Comments) 07/24/2020   Statins Other (See Comments) 09/07/2015    Family History  Problem Relation Age of Onset   Heart disease Mother    Heart disease Father    Cancer Sister        lung   Cancer Brother        prostate    Social History   Socioeconomic History   Marital status: Married    Spouse name: geraldine   Number of children: 3   Years of education: some college   Highest education level: High school graduate  Occupational History   Occupation: retired  Tobacco Use   Smoking status: Former    Current packs/day: 0.00    Average packs/day: 1 pack/day for 30.0 years (30.0 ttl pk-yrs)    Types: Cigarettes    Start date: 12/16/1967    Quit date: 12/15/1997    Years since quitting: 24.9   Smokeless tobacco: Never  Vaping Use   Vaping status: Never Used  Substance and Sexual Activity   Alcohol use: Not Currently    Alcohol/week: 20.0 standard drinks of alcohol    Types: 20 Shots of liquor per week    Comment: OCCASIONAL, 12/28/17 stopped 11/05/17   Drug use: No   Sexual activity: Not on file  Other Topics Concern   Not on file   Social History Narrative   Lives with wife at home    caffeine use- occas, mostly decaf coffee   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    Social Determinants of Health   Financial Resource Strain: Low Risk  (11/18/2022)   Overall Financial Resource Strain (CARDIA)    Difficulty of Paying Living Expenses: Not hard at all  Food Insecurity: No Food Insecurity (12/01/2022)   Hunger Vital Sign    Worried About Running Out of Food in the Last Year: Never true    Ran Out of Food in the Last Year: Never true  Transportation Needs: No Transportation Needs (12/01/2022)   PRAPARE - Administrator, Civil Service (Medical): No    Lack of Transportation (Non-Medical): No  Physical Activity: Not on file  Stress: Not on file  Social Connections: Not on file  Intimate Partner Violence: Not At Risk (12/01/2022)   Humiliation, Afraid, Rape, and Kick questionnaire    Fear of Current or Ex-Partner: No    Emotionally Abused: No    Physically Abused: No    Sexually Abused: No    Review of Systems: All negative except as stated above in HPI.  Physical Exam: Vital signs: Vitals:   12/02/22 0805 12/02/22 0834  BP: (!) 142/68 (!) 154/72  Pulse: 62   Resp: 14 14  Temp: 98.5 F (36.9 C) (!) 97.5 F (36.4 C)  SpO2:  100%     General:   Lethargic, Well-developed, well-nourished, pleasant and cooperative in NAD Head: normocephalic, atraumatic Eyes: anicteric sclera ENT: oropharynx clear Neck: supple, nontender Lungs:  Clear throughout to auscultation.   No wheezes, crackles, or rhonchi. No acute distress. Heart:  Regular rate and rhythm; no murmurs, clicks, rubs,  or gallops. Abdomen: soft, nontender, nondistended, +BS  Rectal:  Deferred Ext: no edema  GI:  Lab Results: Recent Labs    12/01/22 1132 12/02/22 0438  WBC 8.2 7.3  HGB 7.5* 8.2*  HCT 23.3* 24.7*  PLT 292 226   BMET Recent Labs    12/01/22 1132 12/02/22 0438  NA 136 135  K 5.0 4.0  CL 103 106  CO2 20* 19*  GLUCOSE 149* 137*   BUN 43* 36*  CREATININE 2.23* 1.90*  CALCIUM 10.2 9.3   LFT No results for input(s): "PROT", "ALBUMIN", "AST", "ALT", "ALKPHOS", "BILITOT", "BILIDIR", "IBILI" in the last 72 hours. PT/INR Recent Labs    12/02/22 0656  LABPROT 13.7  INR 1.0     Studies/Results: CT Head Wo Contrast  Result Date: 12/01/2022 CLINICAL DATA:  Patient fell off the bed hitting left side of his head on the floor. EXAM: CT HEAD WITHOUT CONTRAST CT CERVICAL SPINE WITHOUT CONTRAST TECHNIQUE: Multidetector CT imaging of the head and cervical spine was performed following the standard protocol without intravenous contrast. Multiplanar CT image reconstructions of the cervical spine were also generated. RADIATION DOSE REDUCTION: This exam was performed according to the departmental dose-optimization program which includes automated exposure control, adjustment of the mA and/or kV according to patient size and/or use of iterative reconstruction technique. COMPARISON:  Brain MRI 02/03/2015 FINDINGS: CT HEAD FINDINGS Brain: There is no evidence for acute hemorrhage, hydrocephalus, mass lesion, or abnormal extra-axial fluid collection. No definite CT evidence for acute infarction. Chronic lacunar infarct noted right basal ganglia. Vascular: No hyperdense vessel or unexpected calcification. Skull: No evidence for fracture. No worrisome lytic or sclerotic lesion. Sinuses/Orbits: The visualized paranasal sinuses and mastoid air cells are clear. Visualized portions of the globes and intraorbital fat are unremarkable. Other: None. CT CERVICAL SPINE FINDINGS Alignment: Straightening of normal cervical lordosis. No subluxation. Skull base and vertebrae: No acute fracture. No primary bone lesion or focal pathologic process. Soft tissues and spinal canal: No prevertebral fluid or swelling. No visible canal hematoma. Disc levels: Intervertebral disc spaces are preserved. The facets are well aligned bilaterally. Upper chest: Unremarkable.  Other: None. IMPRESSION: 1. No acute intracranial abnormality. 2. Chronic lacunar infarct right basal ganglia. 3. Loss of cervical lordosis. This can be related to patient positioning, muscle spasm or soft tissue injury. Electronically Signed   By: Kennith Center M.D.   On: 12/01/2022 13:03   CT Cervical Spine Wo Contrast  Result Date: 12/01/2022 CLINICAL DATA:  Patient fell off the bed hitting  left side of his head on the floor. EXAM: CT HEAD WITHOUT CONTRAST CT CERVICAL SPINE WITHOUT CONTRAST TECHNIQUE: Multidetector CT imaging of the head and cervical spine was performed following the standard protocol without intravenous contrast. Multiplanar CT image reconstructions of the cervical spine were also generated. RADIATION DOSE REDUCTION: This exam was performed according to the departmental dose-optimization program which includes automated exposure control, adjustment of the mA and/or kV according to patient size and/or use of iterative reconstruction technique. COMPARISON:  Brain MRI 02/03/2015 FINDINGS: CT HEAD FINDINGS Brain: There is no evidence for acute hemorrhage, hydrocephalus, mass lesion, or abnormal extra-axial fluid collection. No definite CT evidence for acute infarction. Chronic lacunar infarct noted right basal ganglia. Vascular: No hyperdense vessel or unexpected calcification. Skull: No evidence for fracture. No worrisome lytic or sclerotic lesion. Sinuses/Orbits: The visualized paranasal sinuses and mastoid air cells are clear. Visualized portions of the globes and intraorbital fat are unremarkable. Other: None. CT CERVICAL SPINE FINDINGS Alignment: Straightening of normal cervical lordosis. No subluxation. Skull base and vertebrae: No acute fracture. No primary bone lesion or focal pathologic process. Soft tissues and spinal canal: No prevertebral fluid or swelling. No visible canal hematoma. Disc levels: Intervertebral disc spaces are preserved. The facets are well aligned bilaterally. Upper  chest: Unremarkable. Other: None. IMPRESSION: 1. No acute intracranial abnormality. 2. Chronic lacunar infarct right basal ganglia. 3. Loss of cervical lordosis. This can be related to patient positioning, muscle spasm or soft tissue injury. Electronically Signed   By: Kennith Center M.D.   On: 12/01/2022 13:03   DG Chest Portable 1 View  Result Date: 12/01/2022 CLINICAL DATA:  Shortness of breath. EXAM: PORTABLE CHEST 1 VIEW COMPARISON:  11/14/2022 FINDINGS: The lungs are clear without focal pneumonia, edema, pneumothorax or pleural effusion. Cardiopericardial silhouette is at upper limits of normal for size. No acute bony abnormality. Telemetry leads overlie the chest. IMPRESSION: No active disease. Electronically Signed   By: Kennith Center M.D.   On: 12/01/2022 12:58    Impression/Plan: GI bleed with melena concerning for peptic ulcer bleed on Effient. Anticoagulation on hold. EGD this morning. Supportive care. PPI IV Q 12 hours.    LOS: 1 day   Shirley Friar  12/02/2022, 8:47 AM  Questions please call [phone]

## 2022-12-02 NOTE — Op Note (Signed)
[facility] Patient Name: Ricky Rowland Procedure Date : 12/02/2022 MRN: [id number] Attending MD: Shirley Friar , MD, [id number] Date of Birth: 1945-09-28 CSN: [id number] Age: 77 Admit Type: Inpatient Procedure:                Upper GI endoscopy Indications:              Melena Providers:                Shirley Friar, MD, Weston Settle, RN,                            Kandice Robinsons, Technician Referring MD:             hospital team Medicines:                Propofol per Anesthesia, Monitored Anesthesia Care Complications:            No immediate complications. Estimated Blood Loss:     Estimated blood loss: none. Procedure:                Pre-Anesthesia Assessment:                           - Prior to the procedure, a History and Physical                            was performed, and patient medications and                            allergies were reviewed. The patient's tolerance of                            previous anesthesia was also reviewed. The risks                            and benefits of the procedure and the sedation                            options and risks were discussed with the patient.                            All questions were answered, and informed consent                            was obtained. Prior Anticoagulants: The patient has                            taken Effient (prasugrel), last dose was 2 days                            prior to procedure. ASA Grade Assessment: III - A                            patient with severe systemic disease. After  reviewing the risks and benefits, the patient was                            deemed in satisfactory condition to undergo the                            procedure.                           After obtaining informed consent, the endoscope was                            passed under direct vision. Throughout the                            procedure, the  patient's blood pressure, pulse, and                            oxygen saturations were monitored continuously. The                            [id number]-[id number] ([id number]) Olympus endoscope was introduced                            through the mouth, and advanced to the second part                            of duodenum. The upper GI endoscopy was                            accomplished without difficulty. The patient                            tolerated the procedure well. Scope In: Scope Out: Findings:      The examined esophagus was normal.      The Z-line was regular and was found 45 cm from the incisors.      Segmental mild inflammation characterized by congestion (edema) and       erythema was found in the gastric antrum.      A single small mucosal papule (nodule) with no bleeding and no stigmata       of recent bleeding was found in the gastric antrum.      The cardia and gastric fundus were normal on retroflexion.      Patchy mild mucosal changes characterized by congestion and erythema       were found in the duodenal bulb.      The second portion of the duodenum was normal.      There is no endoscopic evidence of ulceration in the entire examined       stomach. Impression:               - Normal esophagus.                           - Z-line regular, 45 cm from the incisors.                           -  Gastritis.                           - A single mucosal papule (nodule) found in the                            stomach.                           - Mucosal changes in the duodenum.                           - Normal second portion of the duodenum.                           - No specimens collected. Recommendation:           - Observe patient's clinical course.                           - Advance diet as tolerated and clear liquid diet.                           - Resume anticoagulation (dosing per cards/primary                            team). Procedure Code(s):        ---  Professional ---                           [id number], Esophagogastroduodenoscopy, flexible,                            transoral; diagnostic, including collection of                            specimen(s) by brushing or washing, when performed                            (separate procedure) Diagnosis Code(s):        --- Professional ---                           K92.1, Melena (includes Hematochezia)                           K29.70, Gastritis, unspecified, without bleeding                           K31.89, Other diseases of stomach and duodenum CPT copyright 2022 [employer]. All rights reserved. The codes documented in this report are preliminary and upon coder review may  be revised to meet current compliance requirements. Shirley Friar, MD 12/02/2022 9:24:38 AM This report has been signed electronically. Number of Addenda: 0

## 2022-12-02 NOTE — Evaluation (Signed)
Physical Therapy Evaluation and Discharge Patient Details Name: Ricky LEGGE Sr. MRN: [id number] DOB: March 29, 1945 Today's Date: 12/02/2022  History of Present Illness  77 y.o. male comes to the ED 12/01/22 for syncopal episode with fall and hit left side of head. CT head and c spine negative; Hgb 7.5; reports melena; PMH significant of CAD status post recent PCI September 2024 on aspirin and Effient, HLD, HTN, MGUS, CKD 3B, prostate Ca; spinal stenosis; carotid stenosis, CVA  Clinical Impression   Patient evaluated by Physical Therapy with no further acute PT needs identified. Patient walked 400 ft independently without imbalance, including with head turns and avoiding obstacles. BP pre 165/71 and post 155/74 with pt denying dizziness or lightheadedness.  PT is signing off. Thank you for this referral.         If plan is discharge home, recommend the following:     Can travel by private vehicle        Equipment Recommendations None recommended by PT  Recommendations for Other Services       Functional Status Assessment Patient has not had a recent decline in their functional status     Precautions / Restrictions Precautions Precautions: None Restrictions Weight Bearing Restrictions: No      Mobility  Bed Mobility Overal bed mobility: Independent                  Transfers Overall transfer level: Independent Equipment used: None                    Ambulation/Gait Ambulation/Gait assistance: Independent Gait Distance (Feet): 400 Feet Assistive device: None Gait Pattern/deviations: WFL(Within Functional Limits)   Gait velocity interpretation: >2.62 ft/sec, indicative of community ambulatory      Stairs            Wheelchair Mobility     Tilt Bed    Modified Rankin (Stroke Patients Only)       Balance Overall balance assessment: Independent (able to stand eyes closed 10 sec; rhomberg 30 sec)                                            Pertinent Vitals/Pain Pain Assessment Pain Assessment: Faces Faces Pain Scale: Hurts even more Pain Location: neck Pain Descriptors / Indicators: Aching Pain Intervention(s): Limited activity within patient's tolerance, Repositioned, Other (comment) (encouraged)    Home Living Family/patient expects to be discharged to:: Private residence Living Arrangements: Spouse/significant other;Children Available Help at Discharge: Family;Available 24 hours/day Type of Home: House Home Access: Stairs to enter       Home Layout: Two level;Bed/bath upstairs Home Equipment: Agricultural consultant (2 wheels);Shower seat      Prior Function Prior Level of Function : Independent/Modified Independent;Driving                     Extremity/Trunk Assessment   Upper Extremity Assessment Upper Extremity Assessment: Overall WFL for tasks assessed    Lower Extremity Assessment Lower Extremity Assessment: Overall WFL for tasks assessed    Cervical / Trunk Assessment Cervical / Trunk Assessment: Normal  Communication   Communication Communication: Hearing impairment (deaf L)  Cognition Arousal: Alert Behavior During Therapy: WFL for tasks assessed/performed Overall Cognitive Status: Within Functional Limits for tasks assessed  General Comments General comments (skin integrity, edema, etc.): BP supine 165/71; after ambulation 155/74; denied dizziness    Exercises     Assessment/Plan    PT Assessment Patient does not need any further PT services  PT Problem List         PT Treatment Interventions      PT Goals (Current goals can be found in the Care Plan section)  Acute Rehab PT Goals Patient Stated Goal: get moving to reduce soreness PT Goal Formulation: All assessment and education complete, DC therapy    Frequency       Co-evaluation               AM-PAC PT "6 Clicks" Mobility  Outcome  Measure Help needed turning from your back to your side while in a flat bed without using bedrails?: None Help needed moving from lying on your back to sitting on the side of a flat bed without using bedrails?: None Help needed moving to and from a bed to a chair (including a wheelchair)?: None Help needed standing up from a chair using your arms (e.g., wheelchair or bedside chair)?: None Help needed to walk in hospital room?: None Help needed climbing 3-5 steps with a railing? : None 6 Click Score: 24    End of Session Equipment Utilized During Treatment: Gait belt Activity Tolerance: Patient tolerated treatment well Patient left: in chair;with call bell/phone within reach;with family/visitor present Nurse Communication: Mobility status;Other (comment) (pt has meds wife brought in to go to pharmacy; wants to take a shower) PT Visit Diagnosis: Other abnormalities of gait and mobility (R26.89)    Time: 1610-9604 PT Time Calculation (min) (ACUTE ONLY): 24 min   Charges:   PT Evaluation $PT Eval Low Complexity: 1 Low   PT General Charges $$ ACUTE PT VISIT: 1 Visit          Jerolyn Center, PT Acute Rehabilitation Services  Office [phone]   Zena Amos 12/02/2022, 12:19 PM

## 2022-12-02 NOTE — Anesthesia Preprocedure Evaluation (Addendum)
Anesthesia Evaluation  Patient identified by MRN, date of birth, ID band Patient awake    Reviewed: Allergy & Precautions, H&P , NPO status , Patient's Chart, lab work & pertinent test results  Airway Mallampati: III  TM Distance: >3 FB Neck ROM: Full    Dental no notable dental hx.    Pulmonary former smoker 30 pack year history, quit smoking 1999   Pulmonary exam normal breath sounds clear to auscultation       Cardiovascular hypertension (154/72 preop, 110-120SBP at home per pt), + Past MI and + Cardiac Stents (11/16/22 prox LAD DES placed 11/16/22)  Normal cardiovascular exam Rhythm:Regular Rate:Normal  Hs trop 27 >29, down from 5707 on 11/15/22, not consistent with ACS   LVEF was down 40-45% on Echo 11/15/22    Neuro/Psych negative neurological ROS  negative psych ROS   GI/Hepatic Neg liver ROS,GERD  Controlled and Medicated,,GIB   Endo/Other  negative endocrine ROS    Renal/GU Renal InsufficiencyRenal diseaseCr 1.9  negative genitourinary   Musculoskeletal  (+) Arthritis , Osteoarthritis,    Abdominal   Peds negative pediatric ROS (+)  Hematology  (+) Blood dyscrasia, anemia Hb 8.2   Anesthesia Other Findings   Reproductive/Obstetrics negative OB ROS                             Anesthesia Physical Anesthesia Plan  ASA: 3  Anesthesia Plan: MAC   Post-op Pain Management:    Induction:   PONV Risk Score and Plan: 2 and Propofol infusion and TIVA  Airway Management Planned: Natural Airway and Simple Face Mask  Additional Equipment: None  Intra-op Plan:   Post-operative Plan:   Informed Consent: I have reviewed the patients History and Physical, chart, labs and discussed the procedure including the risks, benefits and alternatives for the proposed anesthesia with the patient or authorized representative who has indicated his/her understanding and acceptance.       Plan  Discussed with: CRNA  Anesthesia Plan Comments:        Anesthesia Quick Evaluation

## 2022-12-02 NOTE — Telephone Encounter (Signed)
Pt canceled appt w me tomorrow. He is currently in the hospital w GI bleed. Tereso Newcomer, PA-C    12/02/2022 3:54 PM

## 2022-12-02 NOTE — Progress Notes (Signed)
PROGRESS NOTE                                                                                                                                                                                                             Patient Demographics:    Ricky Rowland, is a 77 y.o. male, DOB - [birth year], GUY:[id number]  Outpatient Primary MD for the patient is [name], MD    LOS - 1  Admit date - 12/01/2022    Chief Complaint  Patient presents with   Fall       Brief Narrative (HPI from H&P)   77 y.o. male with medical history significant of CAD status post recent PCI September 2024 on aspirin and Effient, HLD, HTN, MGUS, CKD 3B comes to the ED for syncopal episode.  Patient states he was letting the dogs out earlier today when he got dizzy and lightheaded.  Came to the ER where he was found to have severe anemia, he also reports history of melena since he was discharged from the hospital on DAPT for his cardiac stent, he has had 3-4 episodes of melena since then.  He was admitted for symptomatic anemia, possible upper GI bleed and further workup.   Subjective:    [name] today has, No headache, No chest pain, No abdominal pain - No Nausea, No new weakness tingling or numbness, no SOB   Assessment  & Plan :   Symptomatic acute upper GI bleed related blood loss anemia.  In a patient with recent coronary stenting and DAPT, discharged on 11/20/2022, has been having melanotic stools intermittently since then.  Has received 1 unit of packed RBC, IV PPI, DAPT per Cards & GI, cardiology GI following.  EGD on 12/02/2022, continue to monitor CBC transfuse as needed goal hemoglobin around 8.   CAD s/p PCI 11/16/2022.  Currently on ASA, cardiology on board, chest pain-free, outpatient Repatha infusions.  History of bradycardia.  Avoiding beta-blocker and rate controlling agents.  Cardiology on board will continue to follow with  cardiology postdischarge. Check TSH.  AKI with history of CKD stage IIIb  - Baseline creatinine 1.7, admission creatinine 2.23.  Gentle fluids & transfuse, better.  Syncope fall with scalp laceration on his left side.  CT head and C-spine unremarkable, sutures placed by EDP.  History of ischemic cardiomyopathy with reduced EF,  40%  - Not in any acute exacerbation.  Appears compensated, defer management to cardiology.      Condition - Fair  Family Communication  :  None  Code Status :  Full  Consults  :  GI, Cards  PUD Prophylaxis : PPI   Procedures  :     CT head and C-spine.  1. No acute intracranial abnormality. 2. Chronic lacunar infarct right basal ganglia. 3. Loss of cervical lordosis. This can be related to patient positioning, muscle spasm or soft tissue injury  EGD.      Disposition Plan  :    Status is: Inpatient  DVT Prophylaxis  :    Place TED hose Start: 12/01/22 1404 SCDs Start: 12/01/22 1403    Lab Results  Component Value Date   PLT 226 12/02/2022    Diet :  Diet Order             Diet NPO time specified  Diet effective now                    Inpatient Medications  Scheduled Meds:  [MAR Hold] aspirin EC  81 mg Oral Daily   [MAR Hold] lidocaine  1 patch Transdermal Q24H   [MAR Hold] pantoprazole (PROTONIX) IV  40 mg Intravenous Q12H   Continuous Infusions:  sodium chloride 75 mL/hr at 12/02/22 0850   PRN Meds:.[MAR Hold] acetaminophen, [MAR Hold] hydrALAZINE, [MAR Hold] ipratropium-albuterol, [MAR Hold] ondansetron (ZOFRAN) IV, [MAR Hold] senna-docusate, [MAR Hold] traZODone  Antibiotics  :    Anti-infectives (From admission, onward)    None         Objective:   Vitals:   12/01/22 2003 12/02/22 0406 12/02/22 0805 12/02/22 0834  BP: (!) 156/100 118/67 (!) 142/68 (!) 154/72  Pulse: 83 65 62   Resp: 18 12 14 14   Temp: 98.8 F (37.1 C) 98.4 F (36.9 C) 98.5 F (36.9 C) (!) 97.5 F (36.4 C)  TempSrc: Oral Oral Oral  Temporal  SpO2: 97% 99%  100%    Wt Readings from Last 3 Encounters:  11/26/22 94 kg  11/19/22 96.5 kg  08/12/22 94.8 kg     Intake/Output Summary (Last 24 hours) at 12/02/2022 0913 Last data filed at 12/02/2022 0912 Gross per 24 hour  Intake 1691.68 ml  Output 300 ml  Net 1391.68 ml     Physical Exam  Awake Alert, No new F.N deficits, Normal affect Media.AT,PERRAL Supple Neck, No JVD,   Symmetrical Chest wall movement, Good air movement bilaterally, CTAB RRR,No Gallops,Rubs or new Murmurs,  +ve B.Sounds, Abd Soft, No tenderness,   No Cyanosis, Clubbing or edema       Data Review:    Recent Labs  Lab 11/26/22 1251 12/01/22 1132 12/02/22 0438  WBC 8.3 8.2 7.3  HGB 9.0* 7.5* 8.2*  HCT 27.1* 23.3* 24.7*  PLT 284 292 226  MCV 97.5 97.9 98.8  MCH 32.4 31.5 32.8  MCHC 33.2 32.2 33.2  RDW 12.9 13.1 14.0  LYMPHSABS  --  1.4  --   MONOABS  --  0.6  --   EOSABS  --  0.0  --   BASOSABS  --  0.0  --     Recent Labs  Lab 11/26/22 1251 11/26/22 1253 12/01/22 1132 12/01/22 1147 12/02/22 0438 12/02/22 0656  NA 132*  --  136  --  135  --   K 5.3*  --  5.0  --  4.0  --  CL 101  --  103  --  106  --   CO2 21*  --  20*  --  19*  --   ANIONGAP 10  --  13  --  10  --   GLUCOSE 126*  --  149*  --  137*  --   BUN 46*  --  43*  --  36*  --   CREATININE 2.10*  --  2.23*  --  1.90*  --   AST 22  --   --   --   --   --   ALT 18  --   --   --   --   --   ALKPHOS 79  --   --   --   --   --   BILITOT 0.9  --   --   --   --   --   ALBUMIN 3.7  --   --   --   --   --   DDIMER  --   --   --  1.82*  --   --   INR  --   --   --   --   --  1.0  BNP  --  205.5*  --   --   --   --   MG  --   --   --   --  2.0  --   CALCIUM 9.9  --  10.2  --  9.3  --       Recent Labs  Lab 11/26/22 1251 11/26/22 1253 12/01/22 1132 12/01/22 1147 12/02/22 0438 12/02/22 0656  DDIMER  --   --   --  1.82*  --   --   INR  --   --   --   --   --  1.0  BNP  --  205.5*  --   --   --   --   MG   --   --   --   --  2.0  --   CALCIUM 9.9  --  10.2  --  9.3  --     Recent Labs    12/02/22 0656  VITAMINB12 231  FOLATE 20.5  FERRITIN 37  TIBC 430  IRON 89  RETICCTPCT 3.5*     Micro Results Recent Results (from the past 240 hour(s))  Resp panel by RT-PCR (RSV, Flu A&B, Covid) Anterior Nasal Swab     Status: None   Collection Time: 12/01/22 11:30 AM   Specimen: Anterior Nasal Swab  Result Value Ref Range Status   SARS Coronavirus 2 by RT PCR NEGATIVE NEGATIVE Final   Influenza A by PCR NEGATIVE NEGATIVE Final   Influenza B by PCR NEGATIVE NEGATIVE Final    Comment: (NOTE) The Xpert Xpress SARS-CoV-2/FLU/RSV plus assay is intended as an aid in the diagnosis of influenza from Nasopharyngeal swab specimens and should not be used as a sole basis for treatment. Nasal washings and aspirates are unacceptable for Xpert Xpress SARS-CoV-2/FLU/RSV testing.  Fact Sheet for Patients: BloggerCourse.com  Fact Sheet for Healthcare Providers: SeriousBroker.it  This test is not yet approved or cleared by the [place] FDA and has been authorized for detection and/or diagnosis of SARS-CoV-2 by FDA under an Emergency Use Authorization (EUA). This EUA will remain in effect (meaning this test can be used) for the duration of the COVID-19 declaration under Section 564(b)(1) of the Act, 21 U.S.C. section 360bbb-3(b)(1), unless the authorization is terminated  or revoked.     Resp Syncytial Virus by PCR NEGATIVE NEGATIVE Final    Comment: (NOTE) Fact Sheet for Patients: BloggerCourse.com  Fact Sheet for Healthcare Providers: SeriousBroker.it  This test is not yet approved or cleared by the [place] FDA and has been authorized for detection and/or diagnosis of SARS-CoV-2 by FDA under an Emergency Use Authorization (EUA). This EUA will remain in effect (meaning this test can be  used) for the duration of the COVID-19 declaration under Section 564(b)(1) of the Act, 21 U.S.C. section 360bbb-3(b)(1), unless the authorization is terminated or revoked.  Performed at [facility], [address], [place], [place] [address]     Radiology Reports CT Head Wo Contrast  Result Date: 12/01/2022 CLINICAL DATA:  Patient fell off the bed hitting left side of his head on the floor. EXAM: CT HEAD WITHOUT CONTRAST CT CERVICAL SPINE WITHOUT CONTRAST TECHNIQUE: Multidetector CT imaging of the head and cervical spine was performed following the standard protocol without intravenous contrast. Multiplanar CT image reconstructions of the cervical spine were also generated. RADIATION DOSE REDUCTION: This exam was performed according to the departmental dose-optimization program which includes automated exposure control, adjustment of the mA and/or kV according to patient size and/or use of iterative reconstruction technique. COMPARISON:  Brain MRI 02/03/2015 FINDINGS: CT HEAD FINDINGS Brain: There is no evidence for acute hemorrhage, hydrocephalus, mass lesion, or abnormal extra-axial fluid collection. No definite CT evidence for acute infarction. Chronic lacunar infarct noted right basal ganglia. Vascular: No hyperdense vessel or unexpected calcification. Skull: No evidence for fracture. No worrisome lytic or sclerotic lesion. Sinuses/Orbits: The visualized paranasal sinuses and mastoid air cells are clear. Visualized portions of the globes and intraorbital fat are unremarkable. Other: None. CT CERVICAL SPINE FINDINGS Alignment: Straightening of normal cervical lordosis. No subluxation. Skull base and vertebrae: No acute fracture. No primary bone lesion or focal pathologic process. Soft tissues and spinal canal: No prevertebral fluid or swelling. No visible canal hematoma. Disc levels: Intervertebral disc spaces are preserved. The facets are well aligned bilaterally. Upper chest: Unremarkable.  Other: None. IMPRESSION: 1. No acute intracranial abnormality. 2. Chronic lacunar infarct right basal ganglia. 3. Loss of cervical lordosis. This can be related to patient positioning, muscle spasm or soft tissue injury. Electronically Signed   By: Kennith Center M.D.   On: 12/01/2022 13:03   CT Cervical Spine Wo Contrast  Result Date: 12/01/2022 CLINICAL DATA:  Patient fell off the bed hitting left side of his head on the floor. EXAM: CT HEAD WITHOUT CONTRAST CT CERVICAL SPINE WITHOUT CONTRAST TECHNIQUE: Multidetector CT imaging of the head and cervical spine was performed following the standard protocol without intravenous contrast. Multiplanar CT image reconstructions of the cervical spine were also generated. RADIATION DOSE REDUCTION: This exam was performed according to the departmental dose-optimization program which includes automated exposure control, adjustment of the mA and/or kV according to patient size and/or use of iterative reconstruction technique. COMPARISON:  Brain MRI 02/03/2015 FINDINGS: CT HEAD FINDINGS Brain: There is no evidence for acute hemorrhage, hydrocephalus, mass lesion, or abnormal extra-axial fluid collection. No definite CT evidence for acute infarction. Chronic lacunar infarct noted right basal ganglia. Vascular: No hyperdense vessel or unexpected calcification. Skull: No evidence for fracture. No worrisome lytic or sclerotic lesion. Sinuses/Orbits: The visualized paranasal sinuses and mastoid air cells are clear. Visualized portions of the globes and intraorbital fat are unremarkable. Other: None. CT CERVICAL SPINE FINDINGS Alignment: Straightening of normal cervical lordosis. No subluxation. Skull base  and vertebrae: No acute fracture. No primary bone lesion or focal pathologic process. Soft tissues and spinal canal: No prevertebral fluid or swelling. No visible canal hematoma. Disc levels: Intervertebral disc spaces are preserved. The facets are well aligned bilaterally. Upper  chest: Unremarkable. Other: None. IMPRESSION: 1. No acute intracranial abnormality. 2. Chronic lacunar infarct right basal ganglia. 3. Loss of cervical lordosis. This can be related to patient positioning, muscle spasm or soft tissue injury. Electronically Signed   By: Kennith Center M.D.   On: 12/01/2022 13:03   DG Chest Portable 1 View  Result Date: 12/01/2022 CLINICAL DATA:  Shortness of breath. EXAM: PORTABLE CHEST 1 VIEW COMPARISON:  11/14/2022 FINDINGS: The lungs are clear without focal pneumonia, edema, pneumothorax or pleural effusion. Cardiopericardial silhouette is at upper limits of normal for size. No acute bony abnormality. Telemetry leads overlie the chest. IMPRESSION: No active disease. Electronically Signed   By: Kennith Center M.D.   On: 12/01/2022 12:58      Signature  -   Susa Raring M.D on 12/02/2022 at 9:13 AM   -  To page go to [email or web address]  Okay sounds good

## 2022-12-02 NOTE — Plan of Care (Signed)
  Problem: Education: Goal: Understanding of cardiac disease, CV risk reduction, and recovery process will improve Outcome: Progressing Goal: Individualized Educational Video(s) Outcome: Progressing   Problem: Activity: Goal: Ability to tolerate increased activity will improve Outcome: Progressing   Problem: Cardiac: Goal: Ability to achieve and maintain adequate cardiovascular perfusion will improve Outcome: Progressing   Problem: Health Behavior/Discharge Planning: Goal: Ability to safely manage health-related needs after discharge will improve Outcome: Progressing   Problem: Education: Goal: Understanding of CV disease, CV risk reduction, and recovery process will improve Outcome: Progressing Goal: Individualized Educational Video(s) Outcome: Progressing   Problem: Activity: Goal: Ability to return to baseline activity level will improve Outcome: Progressing   Problem: Cardiovascular: Goal: Ability to achieve and maintain adequate cardiovascular perfusion will improve Outcome: Progressing   Problem: Education: Goal: Knowledge of General Education information will improve Description: Including pain rating scale, medication(s)/side effects and non-pharmacologic comfort measures Outcome: Progressing   Problem: Health Behavior/Discharge Planning: Goal: Ability to manage health-related needs will improve Outcome: Progressing

## 2022-12-02 NOTE — Progress Notes (Signed)
Rounding Note    Patient Name: Ricky GUNNELL Sr. Date of Encounter: 12/02/2022  [facility] Cardiologist: Charlton Haws, MD   Subjective   BP 150/74. Cr improved (2.23 >1.90).  Denies any chest pain or dyspnea  Inpatient Medications    Scheduled Meds:  aspirin EC  81 mg Oral Daily   clopidogrel  300 mg Oral Once   [START ON 12/03/2022] clopidogrel  75 mg Oral Daily   lidocaine  1 patch Transdermal Q24H   pantoprazole (PROTONIX) IV  40 mg Intravenous Q12H   Continuous Infusions:  sodium chloride 75 mL/hr at 12/02/22 1002   PRN Meds: acetaminophen, hydrALAZINE, ipratropium-albuterol, ondansetron (ZOFRAN) IV, senna-docusate, traZODone   Vital Signs    Vitals:   12/02/22 0930 12/02/22 0940 12/02/22 0959 12/02/22 1000  BP: (!) 148/77 (!) 154/76 (!) 159/100 (!) 150/74  Pulse: 64 67    Resp: 13 16 10    Temp:      TempSrc:      SpO2: 99% 98%      Intake/Output Summary (Last 24 hours) at 12/02/2022 1034 Last data filed at 12/02/2022 0912 Gross per 24 hour  Intake 1691.68 ml  Output 300 ml  Net 1391.68 ml      11/26/2022   11:57 AM 11/19/2022    6:02 AM 11/18/2022   12:22 PM  Last 3 Weights  Weight (lbs) 207 lb 3.2 oz 212 lb 11.9 oz 215 lb 13.3 oz  Weight (kg) 93.985 kg 96.5 kg 97.9 kg      Telemetry    NSR with PACs - Personally Reviewed  ECG    NO new ECG - Personally Reviewed  Physical Exam   GEN: No acute distress.   Neck: No JVD Cardiac: RRR, no murmurs, rubs, or gallops.  Respiratory: Clear to auscultation bilaterally. GI: Soft, nontender, non-distended  MS: No edema; No deformity. Neuro:  Nonfocal  Psych: Normal affect   Labs    High Sensitivity Troponin:   Recent Labs  Lab 11/14/22 1821 11/14/22 2035 11/15/22 0039 12/01/22 1132 12/01/22 1344  TROPONINIHS 144* 2,132* 5,707* 27* 29*     Chemistry Recent Labs  Lab 11/26/22 1251 12/01/22 1132 12/02/22 0438  NA 132* 136 135  K 5.3* 5.0 4.0  CL 101 103 106  CO2 21* 20*  19*  GLUCOSE 126* 149* 137*  BUN 46* 43* 36*  CREATININE 2.10* 2.23* 1.90*  CALCIUM 9.9 10.2 9.3  MG  --   --  2.0  PROT 7.9  --   --   ALBUMIN 3.7  --   --   AST 22  --   --   ALT 18  --   --   ALKPHOS 79  --   --   BILITOT 0.9  --   --   GFRNONAA 31* 29* 35*  ANIONGAP 10 13 10     Lipids No results for input(s): "CHOL", "TRIG", "HDL", "LABVLDL", "LDLCALC", "CHOLHDL" in the last 168 hours.  Hematology Recent Labs  Lab 11/26/22 1251 12/01/22 1132 12/02/22 0438 12/02/22 0656  WBC 8.3 8.2 7.3  --   RBC 2.78* 2.38* 2.50* 2.53*  HGB 9.0* 7.5* 8.2*  --   HCT 27.1* 23.3* 24.7*  --   MCV 97.5 97.9 98.8  --   MCH 32.4 31.5 32.8  --   MCHC 33.2 32.2 33.2  --   RDW 12.9 13.1 14.0  --   PLT 284 292 226  --    Thyroid No results for input(s): "  TSH", "FREET4" in the last 168 hours.  BNP Recent Labs  Lab 11/26/22 1253  BNP 205.5*    DDimer  Recent Labs  Lab 12/01/22 1147  DDIMER 1.82*     Radiology    CT Head Wo Contrast  Result Date: 12/01/2022 CLINICAL DATA:  Patient fell off the bed hitting left side of his head on the floor. EXAM: CT HEAD WITHOUT CONTRAST CT CERVICAL SPINE WITHOUT CONTRAST TECHNIQUE: Multidetector CT imaging of the head and cervical spine was performed following the standard protocol without intravenous contrast. Multiplanar CT image reconstructions of the cervical spine were also generated. RADIATION DOSE REDUCTION: This exam was performed according to the departmental dose-optimization program which includes automated exposure control, adjustment of the mA and/or kV according to patient size and/or use of iterative reconstruction technique. COMPARISON:  Brain MRI 02/03/2015 FINDINGS: CT HEAD FINDINGS Brain: There is no evidence for acute hemorrhage, hydrocephalus, mass lesion, or abnormal extra-axial fluid collection. No definite CT evidence for acute infarction. Chronic lacunar infarct noted right basal ganglia. Vascular: No hyperdense vessel or unexpected  calcification. Skull: No evidence for fracture. No worrisome lytic or sclerotic lesion. Sinuses/Orbits: The visualized paranasal sinuses and mastoid air cells are clear. Visualized portions of the globes and intraorbital fat are unremarkable. Other: None. CT CERVICAL SPINE FINDINGS Alignment: Straightening of normal cervical lordosis. No subluxation. Skull base and vertebrae: No acute fracture. No primary bone lesion or focal pathologic process. Soft tissues and spinal canal: No prevertebral fluid or swelling. No visible canal hematoma. Disc levels: Intervertebral disc spaces are preserved. The facets are well aligned bilaterally. Upper chest: Unremarkable. Other: None. IMPRESSION: 1. No acute intracranial abnormality. 2. Chronic lacunar infarct right basal ganglia. 3. Loss of cervical lordosis. This can be related to patient positioning, muscle spasm or soft tissue injury. Electronically Signed   By: Kennith Center M.D.   On: 12/01/2022 13:03   CT Cervical Spine Wo Contrast  Result Date: 12/01/2022 CLINICAL DATA:  Patient fell off the bed hitting left side of his head on the floor. EXAM: CT HEAD WITHOUT CONTRAST CT CERVICAL SPINE WITHOUT CONTRAST TECHNIQUE: Multidetector CT imaging of the head and cervical spine was performed following the standard protocol without intravenous contrast. Multiplanar CT image reconstructions of the cervical spine were also generated. RADIATION DOSE REDUCTION: This exam was performed according to the departmental dose-optimization program which includes automated exposure control, adjustment of the mA and/or kV according to patient size and/or use of iterative reconstruction technique. COMPARISON:  Brain MRI 02/03/2015 FINDINGS: CT HEAD FINDINGS Brain: There is no evidence for acute hemorrhage, hydrocephalus, mass lesion, or abnormal extra-axial fluid collection. No definite CT evidence for acute infarction. Chronic lacunar infarct noted right basal ganglia. Vascular: No hyperdense  vessel or unexpected calcification. Skull: No evidence for fracture. No worrisome lytic or sclerotic lesion. Sinuses/Orbits: The visualized paranasal sinuses and mastoid air cells are clear. Visualized portions of the globes and intraorbital fat are unremarkable. Other: None. CT CERVICAL SPINE FINDINGS Alignment: Straightening of normal cervical lordosis. No subluxation. Skull base and vertebrae: No acute fracture. No primary bone lesion or focal pathologic process. Soft tissues and spinal canal: No prevertebral fluid or swelling. No visible canal hematoma. Disc levels: Intervertebral disc spaces are preserved. The facets are well aligned bilaterally. Upper chest: Unremarkable. Other: None. IMPRESSION: 1. No acute intracranial abnormality. 2. Chronic lacunar infarct right basal ganglia. 3. Loss of cervical lordosis. This can be related to patient positioning, muscle spasm or soft tissue injury. Electronically Signed  By: Kennith Center M.D.   On: 12/01/2022 13:03   DG Chest Portable 1 View  Result Date: 12/01/2022 CLINICAL DATA:  Shortness of breath. EXAM: PORTABLE CHEST 1 VIEW COMPARISON:  11/14/2022 FINDINGS: The lungs are clear without focal pneumonia, edema, pneumothorax or pleural effusion. Cardiopericardial silhouette is at upper limits of normal for size. No acute bony abnormality. Telemetry leads overlie the chest. IMPRESSION: No active disease. Electronically Signed   By: Kennith Center M.D.   On: 12/01/2022 12:58    Cardiac Studies     Patient Profile     77 y.o. 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 Nelson Chimes.   Assessment & Plan    CAD: s/p prox LAD DES  placed 11/16/22.  Presented with GI bleed. -Continue aspirin 81 mg daily -Effient was held yesterday due to GI bleed.  EGD today does not show source of bleeding.  Suspected small bowel source per GI, can restart DAPT today and monitor for recurrent bleeding.  Patient was started on Effient instead of Brilinta after PCI due to interaction with Xtandi; Plavix was not chosen due to high CAD burden.  However in setting of GI bleed and increased risk of bleeding on Effient, would recommend switching to Plavix.  Discussed with pharmacy and recommend loading with 300 mg Plavix today and then resuming 75 mg Plavix daily tomorrow.  Will monitor closely for further bleeding  Chronic systolic heart failure: EF 40 to 45% on echo 11/15/2022.  Presented with AKI, losartan on hold.  Not on beta-blocker due to bradycardia -Renal function improving.  Would recommend stopping IV fluids to avoid volume overload given reduced EF  Acute blood loss anemia: Due to GI bleed.  EGD did not show source of bleeding.  Suspected small bowel source per GI.  Okay with restarting DAPT today monitor for further bleeding   For questions or updates, please contact [facility] Please consult [email or web address] for contact info under        Signed, Little Ishikawa, MD  12/02/2022, 10:34 AM

## 2022-12-02 NOTE — Interval H&P Note (Signed)
History and Physical Interval Note:  12/02/2022 8:58 AM  Ricky Panther Sr.  has presented today for surgery, with the diagnosis of gi bleed.  The various methods of treatment have been discussed with the patient and family. After consideration of risks, benefits and other options for treatment, the patient has consented to  Procedure(s): ESOPHAGOGASTRODUODENOSCOPY (EGD) WITH PROPOFOL (N/A) as a surgical intervention.  The patient's history has been reviewed, patient examined, no change in status, stable for surgery.  I have reviewed the patient's chart and labs.  Questions were answered to the patient's satisfaction.     Shirley Friar

## 2022-12-03 ENCOUNTER — Ambulatory Visit: Payer: Medicare Other | Admitting: Physician Assistant

## 2022-12-03 DIAGNOSIS — I25118 Atherosclerotic heart disease of native coronary artery with other forms of angina pectoris: Secondary | ICD-10-CM | POA: Diagnosis not present

## 2022-12-03 DIAGNOSIS — D649 Anemia, unspecified: Secondary | ICD-10-CM | POA: Diagnosis not present

## 2022-12-03 DIAGNOSIS — I5022 Chronic systolic (congestive) heart failure: Secondary | ICD-10-CM | POA: Diagnosis not present

## 2022-12-03 DIAGNOSIS — K922 Gastrointestinal hemorrhage, unspecified: Secondary | ICD-10-CM | POA: Diagnosis not present

## 2022-12-03 LAB — CBC WITH DIFFERENTIAL/PLATELET
Abs Immature Granulocytes: 0.03 10*3/uL (ref 0.00–0.07)
Abs Immature Granulocytes: 0.03 10*3/uL (ref 0.00–0.07)
Basophils Absolute: 0 10*3/uL (ref 0.0–0.1)
Basophils Absolute: 0 10*3/uL (ref 0.0–0.1)
Basophils Relative: 0 %
Basophils Relative: 0 %
Eosinophils Absolute: 0.1 10*3/uL (ref 0.0–0.5)
Eosinophils Absolute: 0.1 10*3/uL (ref 0.0–0.5)
Eosinophils Relative: 2 %
Eosinophils Relative: 2 %
HCT: 23.4 % — ABNORMAL LOW (ref 39.0–52.0)
HCT: 25.9 % — ABNORMAL LOW (ref 39.0–52.0)
Hemoglobin: 7.6 g/dL — ABNORMAL LOW (ref 13.0–17.0)
Hemoglobin: 8.9 g/dL — ABNORMAL LOW (ref 13.0–17.0)
Immature Granulocytes: 1 %
Immature Granulocytes: 0 %
Lymphocytes Relative: 25 %
Lymphocytes Relative: 18 %
Lymphs Abs: 1.6 10*3/uL (ref 0.7–4.0)
Lymphs Abs: 1.3 10*3/uL (ref 0.7–4.0)
MCH: 31.7 pg (ref 26.0–34.0)
MCH: 33.2 pg (ref 26.0–34.0)
MCHC: 32.5 g/dL (ref 30.0–36.0)
MCHC: 34.4 g/dL (ref 30.0–36.0)
MCV: 97.5 fL (ref 80.0–100.0)
MCV: 96.6 fL (ref 80.0–100.0)
Monocytes Absolute: 0.7 10*3/uL (ref 0.1–1.0)
Monocytes Absolute: 0.7 10*3/uL (ref 0.1–1.0)
Monocytes Relative: 10 %
Monocytes Relative: 9 %
Neutro Abs: 4.1 10*3/uL (ref 1.7–7.7)
Neutro Abs: 5.2 10*3/uL (ref 1.7–7.7)
Neutrophils Relative %: 62 %
Neutrophils Relative %: 71 %
Platelets: 219 10*3/uL (ref 150–400)
Platelets: 201 10*3/uL (ref 150–400)
RBC: 2.4 MIL/uL — ABNORMAL LOW (ref 4.22–5.81)
RBC: 2.68 MIL/uL — ABNORMAL LOW (ref 4.22–5.81)
RDW: 13.8 % (ref 11.5–15.5)
RDW: 14.6 % (ref 11.5–15.5)
WBC: 6.6 10*3/uL (ref 4.0–10.5)
WBC: 7.3 10*3/uL (ref 4.0–10.5)
nRBC: 0 % (ref 0.0–0.2)
nRBC: 0 % (ref 0.0–0.2)

## 2022-12-03 LAB — BASIC METABOLIC PANEL
Anion gap: 12 (ref 5–15)
BUN: 32 mg/dL — ABNORMAL HIGH (ref 8–23)
CO2: 19 mmol/L — ABNORMAL LOW (ref 22–32)
Calcium: 9.3 mg/dL (ref 8.9–10.3)
Chloride: 105 mmol/L (ref 98–111)
Creatinine, Ser: 1.78 mg/dL — ABNORMAL HIGH (ref 0.61–1.24)
GFR, Estimated: 38 mL/min — ABNORMAL LOW (ref >60)
Glucose, Bld: 140 mg/dL — ABNORMAL HIGH (ref 70–99)
Potassium: 3.8 mmol/L (ref 3.5–5.1)
Sodium: 136 mmol/L (ref 135–145)

## 2022-12-03 LAB — MAGNESIUM: Magnesium: 1.9 mg/dL (ref 1.7–2.4)

## 2022-12-03 LAB — BRAIN NATRIURETIC PEPTIDE: B Natriuretic Peptide: 281.2 pg/mL — ABNORMAL HIGH (ref 0.0–100.0)

## 2022-12-03 LAB — GLUCOSE, CAPILLARY
Glucose-Capillary: 134 mg/dL — ABNORMAL HIGH (ref 70–99)
Glucose-Capillary: 138 mg/dL — ABNORMAL HIGH (ref 70–99)
Glucose-Capillary: 110 mg/dL — ABNORMAL HIGH (ref 70–99)
Glucose-Capillary: 158 mg/dL — ABNORMAL HIGH (ref 70–99)

## 2022-12-03 LAB — PREPARE RBC (CROSSMATCH)

## 2022-12-03 MED ORDER — POLYETHYLENE GLYCOL 3350 17 G PO PACK
17.0000 g | PACK | Freq: Two times a day (BID) | ORAL | Status: DC
  Filled 2022-12-03: qty 1

## 2022-12-03 MED ORDER — POLYETHYLENE GLYCOL 3350 17 G PO PACK
17.0000 g | PACK | Freq: Every day | ORAL | Status: DC | PRN
  Administered 2022-12-04: 17 g via ORAL
  Filled 2022-12-03: qty 1

## 2022-12-03 MED ORDER — DOCUSATE SODIUM 100 MG PO CAPS
200.0000 mg | ORAL_CAPSULE | Freq: Two times a day (BID) | ORAL | Status: DC
  Administered 2022-12-04: 200 mg via ORAL
  Filled 2022-12-03 (×3): qty 2

## 2022-12-03 MED ORDER — POLYETHYLENE GLYCOL 3350 17 G PO PACK
17.0000 g | PACK | Freq: Every day | ORAL | Status: DC | PRN
Start: 2022-12-03 — End: 2022-12-03

## 2022-12-03 MED ORDER — MAGNESIUM HYDROXIDE 400 MG/5ML PO SUSP
30.0000 mL | Freq: Two times a day (BID) | ORAL | Status: AC
  Administered 2022-12-03: 30 mL via ORAL
  Filled 2022-12-03: qty 30

## 2022-12-03 MED ORDER — SODIUM CHLORIDE 0.9% IV SOLUTION: Freq: Once | INTRAVENOUS | Status: AC

## 2022-12-03 NOTE — Progress Notes (Signed)
PROGRESS NOTE                                                                                                                                                                                                             Patient Demographics:    Ricky Rowland, is a 77 y.o. male, DOB - April 16, 1945, UVO:[id number]  Outpatient Primary MD for the patient is [name], MD    LOS - 2  Admit date - 12/01/2022    Chief Complaint  Patient presents with   Fall       Brief Narrative (HPI from H&P)   77 y.o. male with medical history significant of CAD status post recent PCI September 2024 on aspirin and Effient, HLD, HTN, MGUS, CKD 3B comes to the ED for syncopal episode.  Patient states he was letting the dogs out earlier today when he got dizzy and lightheaded.  Came to the ER where he was found to have severe anemia, he also reports history of melena since he was discharged from the hospital on DAPT for his cardiac stent, he has had 3-4 episodes of melena since then.  He was admitted for symptomatic anemia, possible upper GI bleed and further workup.   Subjective:   Active   Assessment  & Plan :   Symptomatic acute upper GI bleed related blood loss anemia.  In a patient with recent coronary stenting and DAPT, discharged on 11/20/2022, has been having melanotic stools intermittently since then.  Has received 1 unit of packed RBC, IV PPI, DAPT per Cards & GI, both following.  EGD done was unremarkable on 12/02/2022 by [facility], DAPT resumed upon GI recommendation on 12/02/2022, 1 your unit of packed RBC on 12/03/2022, do not think he is actively bleeding but probably still old blood loss is being calibrated, monitor on DAPT and PPI, if any evidence of new bleeding then GI will do appropriate testing as needed.  CAD s/p PCI 11/16/2022.  Currently on DAPT again since 12/02/2022, cardiology on board, chest pain-free, outpatient Repatha  infusions.  History of bradycardia.  Avoiding beta-blocker and rate controlling agents.  Cardiology on board will continue to follow with cardiology postdischarge, stable TSH  AKI with history of CKD stage IIIb  - Baseline creatinine 1.7, admission creatinine 2.23.  Gentle fluids & transfuse, cloase to baseline now.  Syncope  fall with scalp laceration on his left side.  CT head and C-spine unremarkable, sutures placed by EDP.  History of ischemic cardiomyopathy with reduced EF, 40%  - Not in any acute exacerbation.  Appears compensated, defer management to cardiology.      Condition - Fair  Family Communication  :  None  Code Status :  Full  Consults  :  GI, Cards  PUD Prophylaxis : PPI   Procedures  :     CT head and C-spine.  1. No acute intracranial abnormality. 2. Chronic lacunar infarct right basal ganglia. 3. Loss of cervical lordosis. This can be related to patient positioning, muscle spasm or soft tissue injury  EGD.  Done by [name] 12/02/2022, unremarkable.      Disposition Plan  :    Status is: Inpatient  DVT Prophylaxis  :    Place TED hose Start: 12/01/22 1404 SCDs Start: 12/01/22 1403    Lab Results  Component Value Date   PLT 219 12/03/2022    Diet :  Diet Order             Diet Heart Room service appropriate? Yes; Fluid consistency: Thin  Diet effective now                    Inpatient Medications  Scheduled Meds:  aspirin EC  81 mg Oral Daily   clopidogrel  75 mg Oral Daily   lidocaine  1 patch Transdermal Q24H   pantoprazole (PROTONIX) IV  40 mg Intravenous Q12H   Continuous Infusions:   PRN Meds:.acetaminophen, hydrALAZINE, ipratropium-albuterol, ondansetron (ZOFRAN) IV, senna-docusate, traZODone  Antibiotics  :    Anti-infectives (From admission, onward)    None         Objective:   Vitals:   12/03/22 0011 12/03/22 0412 12/03/22 0624 12/03/22 0648  BP: 137/72 124/62 138/74 (!) 151/75  Pulse: 62 65 (!) 56 65   Resp: 15 13 13 14   Temp: 98.2 F (36.8 C) 98 F (36.7 C) 97.8 F (36.6 C) 97.9 F (36.6 C)  TempSrc: Oral Oral Oral Oral  SpO2: 96% 98% 97% 97%    Wt Readings from Last 3 Encounters:  11/26/22 94 kg  11/19/22 96.5 kg  08/12/22 94.8 kg     Intake/Output Summary (Last 24 hours) at 12/03/2022 1093 Last data filed at 12/03/2022 0500 Gross per 24 hour  Intake 910 ml  Output 700 ml  Net 210 ml     Physical Exam  Awake Alert, No new F.N deficits, Normal affect [place].AT,PERRAL Supple Neck, No JVD,   Symmetrical Chest wall movement, Good air movement bilaterally, CTAB RRR,No Gallops,Rubs or new Murmurs,  +ve B.Sounds, Abd Soft, No tenderness,   No Cyanosis, Clubbing or edema       Data Review:    Recent Labs  Lab 11/26/22 1251 12/01/22 1132 12/02/22 0438 12/03/22 0357  WBC 8.3 8.2 7.3 6.6  HGB 9.0* 7.5* 8.2* 7.6*  HCT 27.1* 23.3* 24.7* 23.4*  PLT 284 292 226 219  MCV 97.5 97.9 98.8 97.5  MCH 32.4 31.5 32.8 31.7  MCHC 33.2 32.2 33.2 32.5  RDW 12.9 13.1 14.0 13.8  LYMPHSABS  --  1.4  --  1.6  MONOABS  --  0.6  --  0.7  EOSABS  --  0.0  --  0.1  BASOSABS  --  0.0  --  0.0    Recent Labs  Lab 11/26/22 1251 11/26/22 1253 12/01/22 1132 12/01/22 1147 12/02/22  5638 12/02/22 0438 12/02/22 0656 12/03/22 0357  NA 132*  --  136  --   --  135  --  136  K 5.3*  --  5.0  --   --  4.0  --  3.8  CL 101  --  103  --   --  106  --  105  CO2 21*  --  20*  --   --  19*  --  19*  ANIONGAP 10  --  13  --   --  10  --  12  GLUCOSE 126*  --  149*  --   --  137*  --  140*  BUN 46*  --  43*  --   --  36*  --  32*  CREATININE 2.10*  --  2.23*  --   --  1.90*  --  1.78*  AST 22  --   --   --   --   --   --   --   ALT 18  --   --   --   --   --   --   --   ALKPHOS 79  --   --   --   --   --   --   --   BILITOT 0.9  --   --   --   --   --   --   --   ALBUMIN 3.7  --   --   --   --   --   --   --   DDIMER  --   --   --  1.82*  --   --   --   --   INR  --   --   --   --   --    --  1.0  --   TSH  --   --   --   --  2.870  --   --   --   BNP  --  205.5*  --   --   --   --   --  281.2*  MG  --   --   --   --   --  2.0  --  1.9  CALCIUM 9.9  --  10.2  --   --  9.3  --  9.3      Recent Labs  Lab 11/26/22 1251 11/26/22 1253 12/01/22 1132 12/01/22 1147 12/02/22 0435 12/02/22 0438 12/02/22 0656 12/03/22 0357  DDIMER  --   --   --  1.82*  --   --   --   --   INR  --   --   --   --   --   --  1.0  --   TSH  --   --   --   --  2.870  --   --   --   BNP  --  205.5*  --   --   --   --   --  281.2*  MG  --   --   --   --   --  2.0  --  1.9  CALCIUM 9.9  --  10.2  --   --  9.3  --  9.3    Recent Labs    12/02/22 0656  VITAMINB12 231  FOLATE 20.5  FERRITIN 37  TIBC 430  IRON 89  RETICCTPCT 3.5*     Micro Results Recent  Results (from the past 240 hour(s))  Resp panel by RT-PCR (RSV, Flu A&B, Covid) Anterior Nasal Swab     Status: None   Collection Time: 12/01/22 11:30 AM   Specimen: Anterior Nasal Swab  Result Value Ref Range Status   SARS Coronavirus 2 by RT PCR NEGATIVE NEGATIVE Final   Influenza A by PCR NEGATIVE NEGATIVE Final   Influenza B by PCR NEGATIVE NEGATIVE Final    Comment: (NOTE) The Xpert Xpress SARS-CoV-2/FLU/RSV plus assay is intended as an aid in the diagnosis of influenza from Nasopharyngeal swab specimens and should not be used as a sole basis for treatment. Nasal washings and aspirates are unacceptable for Xpert Xpress SARS-CoV-2/FLU/RSV testing.  Fact Sheet for Patients: BloggerCourse.com  Fact Sheet for Healthcare Providers: SeriousBroker.it  This test is not yet approved or cleared by the [place] FDA and has been authorized for detection and/or diagnosis of SARS-CoV-2 by FDA under an Emergency Use Authorization (EUA). This EUA will remain in effect (meaning this test can be used) for the duration of the COVID-19 declaration under Section 564(b)(1) of the Act, 21  U.S.C. section 360bbb-3(b)(1), unless the authorization is terminated or revoked.     Resp Syncytial Virus by PCR NEGATIVE NEGATIVE Final    Comment: (NOTE) Fact Sheet for Patients: BloggerCourse.com  Fact Sheet for Healthcare Providers: SeriousBroker.it  This test is not yet approved or cleared by the [place] FDA and has been authorized for detection and/or diagnosis of SARS-CoV-2 by FDA under an Emergency Use Authorization (EUA). This EUA will remain in effect (meaning this test can be used) for the duration of the COVID-19 declaration under Section 564(b)(1) of the Act, 21 U.S.C. section 360bbb-3(b)(1), unless the authorization is terminated or revoked.  Performed at [facility], [address], [place], [place] [address]     Radiology Reports CT Head Wo Contrast  Result Date: 12/01/2022 CLINICAL DATA:  Patient fell off the bed hitting left side of his head on the floor. EXAM: CT HEAD WITHOUT CONTRAST CT CERVICAL SPINE WITHOUT CONTRAST TECHNIQUE: Multidetector CT imaging of the head and cervical spine was performed following the standard protocol without intravenous contrast. Multiplanar CT image reconstructions of the cervical spine were also generated. RADIATION DOSE REDUCTION: This exam was performed according to the departmental dose-optimization program which includes automated exposure control, adjustment of the mA and/or kV according to patient size and/or use of iterative reconstruction technique. COMPARISON:  Brain MRI 02/03/2015 FINDINGS: CT HEAD FINDINGS Brain: There is no evidence for acute hemorrhage, hydrocephalus, mass lesion, or abnormal extra-axial fluid collection. No definite CT evidence for acute infarction. Chronic lacunar infarct noted right basal ganglia. Vascular: No hyperdense vessel or unexpected calcification. Skull: No evidence for fracture. No worrisome lytic or sclerotic lesion. Sinuses/Orbits:  The visualized paranasal sinuses and mastoid air cells are clear. Visualized portions of the globes and intraorbital fat are unremarkable. Other: None. CT CERVICAL SPINE FINDINGS Alignment: Straightening of normal cervical lordosis. No subluxation. Skull base and vertebrae: No acute fracture. No primary bone lesion or focal pathologic process. Soft tissues and spinal canal: No prevertebral fluid or swelling. No visible canal hematoma. Disc levels: Intervertebral disc spaces are preserved. The facets are well aligned bilaterally. Upper chest: Unremarkable. Other: None. IMPRESSION: 1. No acute intracranial abnormality. 2. Chronic lacunar infarct right basal ganglia. 3. Loss of cervical lordosis. This can be related to patient positioning, muscle spasm or soft tissue injury. Electronically Signed   By: Kennith Center M.D.   On:  12/01/2022 13:03   CT Cervical Spine Wo Contrast  Result Date: 12/01/2022 CLINICAL DATA:  Patient fell off the bed hitting left side of his head on the floor. EXAM: CT HEAD WITHOUT CONTRAST CT CERVICAL SPINE WITHOUT CONTRAST TECHNIQUE: Multidetector CT imaging of the head and cervical spine was performed following the standard protocol without intravenous contrast. Multiplanar CT image reconstructions of the cervical spine were also generated. RADIATION DOSE REDUCTION: This exam was performed according to the departmental dose-optimization program which includes automated exposure control, adjustment of the mA and/or kV according to patient size and/or use of iterative reconstruction technique. COMPARISON:  Brain MRI 02/03/2015 FINDINGS: CT HEAD FINDINGS Brain: There is no evidence for acute hemorrhage, hydrocephalus, mass lesion, or abnormal extra-axial fluid collection. No definite CT evidence for acute infarction. Chronic lacunar infarct noted right basal ganglia. Vascular: No hyperdense vessel or unexpected calcification. Skull: No evidence for fracture. No worrisome lytic or sclerotic  lesion. Sinuses/Orbits: The visualized paranasal sinuses and mastoid air cells are clear. Visualized portions of the globes and intraorbital fat are unremarkable. Other: None. CT CERVICAL SPINE FINDINGS Alignment: Straightening of normal cervical lordosis. No subluxation. Skull base and vertebrae: No acute fracture. No primary bone lesion or focal pathologic process. Soft tissues and spinal canal: No prevertebral fluid or swelling. No visible canal hematoma. Disc levels: Intervertebral disc spaces are preserved. The facets are well aligned bilaterally. Upper chest: Unremarkable. Other: None. IMPRESSION: 1. No acute intracranial abnormality. 2. Chronic lacunar infarct right basal ganglia. 3. Loss of cervical lordosis. This can be related to patient positioning, muscle spasm or soft tissue injury. Electronically Signed   By: Kennith Center M.D.   On: 12/01/2022 13:03   DG Chest Portable 1 View  Result Date: 12/01/2022 CLINICAL DATA:  Shortness of breath. EXAM: PORTABLE CHEST 1 VIEW COMPARISON:  11/14/2022 FINDINGS: The lungs are clear without focal pneumonia, edema, pneumothorax or pleural effusion. Cardiopericardial silhouette is at upper limits of normal for size. No acute bony abnormality. Telemetry leads overlie the chest. IMPRESSION: No active disease. Electronically Signed   By: Kennith Center M.D.   On: 12/01/2022 12:58      Signature  -   Susa Raring M.D on 12/03/2022 at 8:22 AM   -  To page go to [email or web address]  Okay sounds good

## 2022-12-03 NOTE — Progress Notes (Signed)
Rounding Note    Patient Name: Ricky LOPORTO Sr. Date of Encounter: 12/03/2022  [facility] Cardiologist: Charlton Haws, MD   Subjective   BP 118/84. Cr improving (2.23 >1.90>1.78).  Hgb 7.6. Denies any chest pain or dyspnea  Inpatient Medications    Scheduled Meds:  aspirin EC  81 mg Oral Daily   clopidogrel  75 mg Oral Daily   docusate sodium  200 mg Oral BID   lidocaine  1 patch Transdermal Q24H   magnesium hydroxide  30 mL Oral BID   pantoprazole (PROTONIX) IV  40 mg Intravenous Q12H   polyethylene glycol  17 g Oral BID   Continuous Infusions:   PRN Meds: acetaminophen, hydrALAZINE, ipratropium-albuterol, ondansetron (ZOFRAN) IV, senna-docusate, traZODone   Vital Signs    Vitals:   12/03/22 0412 12/03/22 0624 12/03/22 0648 12/03/22 0953  BP: 124/62 138/74 (!) 151/75 118/84  Pulse: 65 (!) 56 65   Resp: 13 13 14    Temp: 98 F (36.7 C) 97.8 F (36.6 C) 97.9 F (36.6 C) 98.5 F (36.9 C)  TempSrc: Oral Oral Oral Oral  SpO2: 98% 97% 97%     Intake/Output Summary (Last 24 hours) at 12/03/2022 1014 Last data filed at 12/03/2022 0953 Gross per 24 hour  Intake 1026.67 ml  Output 700 ml  Net 326.67 ml      11/26/2022   11:57 AM 11/19/2022    6:02 AM 11/18/2022   12:22 PM  Last 3 Weights  Weight (lbs) 207 lb 3.2 oz 212 lb 11.9 oz 215 lb 13.3 oz  Weight (kg) 93.985 kg 96.5 kg 97.9 kg      Telemetry    NSR with PACs - Personally Reviewed  ECG    NO new ECG - Personally Reviewed  Physical Exam   GEN: No acute distress.   Neck: No JVD Cardiac: RRR, no murmurs, rubs, or gallops.  Respiratory: Clear to auscultation bilaterally. GI: Soft, nontender, non-distended  MS: No edema; No deformity. Neuro:  Nonfocal  Psych: Normal affect   Labs    High Sensitivity Troponin:   Recent Labs  Lab 11/14/22 1821 11/14/22 2035 11/15/22 0039 12/01/22 1132 12/01/22 1344  TROPONINIHS 144* 2,132* 5,707* 27* 29*     Chemistry Recent Labs  Lab  11/26/22 1251 12/01/22 1132 12/02/22 0438 12/03/22 0357  NA 132* 136 135 136  K 5.3* 5.0 4.0 3.8  CL 101 103 106 105  CO2 21* 20* 19* 19*  GLUCOSE 126* 149* 137* 140*  BUN 46* 43* 36* 32*  CREATININE 2.10* 2.23* 1.90* 1.78*  CALCIUM 9.9 10.2 9.3 9.3  MG  --   --  2.0 1.9  PROT 7.9  --   --   --   ALBUMIN 3.7  --   --   --   AST 22  --   --   --   ALT 18  --   --   --   ALKPHOS 79  --   --   --   BILITOT 0.9  --   --   --   GFRNONAA 31* 29* 35* 38*  ANIONGAP 10 13 10 12     Lipids No results for input(s): "CHOL", "TRIG", "HDL", "LABVLDL", "LDLCALC", "CHOLHDL" in the last 168 hours.  Hematology Recent Labs  Lab 12/01/22 1132 12/02/22 0438 12/02/22 0656 12/03/22 0357  WBC 8.2 7.3  --  6.6  RBC 2.38* 2.50* 2.53* 2.40*  HGB 7.5* 8.2*  --  7.6*  HCT 23.3* 24.7*  --  23.4*  MCV 97.9 98.8  --  97.5  MCH 31.5 32.8  --  31.7  MCHC 32.2 33.2  --  32.5  RDW 13.1 14.0  --  13.8  PLT 292 226  --  219   Thyroid  Recent Labs  Lab 12/02/22 0435  TSH 2.870    BNP Recent Labs  Lab 11/26/22 1253 12/03/22 0357  BNP 205.5* 281.2*    DDimer  Recent Labs  Lab 12/01/22 1147  DDIMER 1.82*     Radiology    CT Head Wo Contrast  Result Date: 12/01/2022 CLINICAL DATA:  Patient fell off the bed hitting left side of his head on the floor. EXAM: CT HEAD WITHOUT CONTRAST CT CERVICAL SPINE WITHOUT CONTRAST TECHNIQUE: Multidetector CT imaging of the head and cervical spine was performed following the standard protocol without intravenous contrast. Multiplanar CT image reconstructions of the cervical spine were also generated. RADIATION DOSE REDUCTION: This exam was performed according to the departmental dose-optimization program which includes automated exposure control, adjustment of the mA and/or kV according to patient size and/or use of iterative reconstruction technique. COMPARISON:  Brain MRI 02/03/2015 FINDINGS: CT HEAD FINDINGS Brain: There is no evidence for acute hemorrhage,  hydrocephalus, mass lesion, or abnormal extra-axial fluid collection. No definite CT evidence for acute infarction. Chronic lacunar infarct noted right basal ganglia. Vascular: No hyperdense vessel or unexpected calcification. Skull: No evidence for fracture. No worrisome lytic or sclerotic lesion. Sinuses/Orbits: The visualized paranasal sinuses and mastoid air cells are clear. Visualized portions of the globes and intraorbital fat are unremarkable. Other: None. CT CERVICAL SPINE FINDINGS Alignment: Straightening of normal cervical lordosis. No subluxation. Skull base and vertebrae: No acute fracture. No primary bone lesion or focal pathologic process. Soft tissues and spinal canal: No prevertebral fluid or swelling. No visible canal hematoma. Disc levels: Intervertebral disc spaces are preserved. The facets are well aligned bilaterally. Upper chest: Unremarkable. Other: None. IMPRESSION: 1. No acute intracranial abnormality. 2. Chronic lacunar infarct right basal ganglia. 3. Loss of cervical lordosis. This can be related to patient positioning, muscle spasm or soft tissue injury. Electronically Signed   By: Kennith Center M.D.   On: 12/01/2022 13:03   CT Cervical Spine Wo Contrast  Result Date: 12/01/2022 CLINICAL DATA:  Patient fell off the bed hitting left side of his head on the floor. EXAM: CT HEAD WITHOUT CONTRAST CT CERVICAL SPINE WITHOUT CONTRAST TECHNIQUE: Multidetector CT imaging of the head and cervical spine was performed following the standard protocol without intravenous contrast. Multiplanar CT image reconstructions of the cervical spine were also generated. RADIATION DOSE REDUCTION: This exam was performed according to the departmental dose-optimization program which includes automated exposure control, adjustment of the mA and/or kV according to patient size and/or use of iterative reconstruction technique. COMPARISON:  Brain MRI 02/03/2015 FINDINGS: CT HEAD FINDINGS Brain: There is no evidence  for acute hemorrhage, hydrocephalus, mass lesion, or abnormal extra-axial fluid collection. No definite CT evidence for acute infarction. Chronic lacunar infarct noted right basal ganglia. Vascular: No hyperdense vessel or unexpected calcification. Skull: No evidence for fracture. No worrisome lytic or sclerotic lesion. Sinuses/Orbits: The visualized paranasal sinuses and mastoid air cells are clear. Visualized portions of the globes and intraorbital fat are unremarkable. Other: None. CT CERVICAL SPINE FINDINGS Alignment: Straightening of normal cervical lordosis. No subluxation. Skull base and vertebrae: No acute fracture. No primary bone lesion or focal pathologic process. Soft tissues and spinal canal: No prevertebral fluid or swelling. No visible canal hematoma.  Disc levels: Intervertebral disc spaces are preserved. The facets are well aligned bilaterally. Upper chest: Unremarkable. Other: None. IMPRESSION: 1. No acute intracranial abnormality. 2. Chronic lacunar infarct right basal ganglia. 3. Loss of cervical lordosis. This can be related to patient positioning, muscle spasm or soft tissue injury. Electronically Signed   By: Kennith Center M.D.   On: 12/01/2022 13:03   DG Chest Portable 1 View  Result Date: 12/01/2022 CLINICAL DATA:  Shortness of breath. EXAM: PORTABLE CHEST 1 VIEW COMPARISON:  11/14/2022 FINDINGS: The lungs are clear without focal pneumonia, edema, pneumothorax or pleural effusion. Cardiopericardial silhouette is at upper limits of normal for size. No acute bony abnormality. Telemetry leads overlie the chest. IMPRESSION: No active disease. Electronically Signed   By: Kennith Center M.D.   On: 12/01/2022 12:58    Cardiac Studies     Patient Profile     77 y.o. 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 Nelson Chimes.   Assessment & Plan    CAD: s/p prox LAD DES placed 11/16/22.  Presented with GI bleed. -Continue aspirin 81 mg daily -Effient was held due to GI bleed.  EGD 9/24 does not show source of bleeding.  Suspected small bowel source per GI, can restart DAPT today and monitor for recurrent bleeding.  Patient was started on Effient instead of Brilinta after PCI due to interaction with Xtandi; Plavix was not chosen due to high CAD burden.  However in setting of GI bleed and increased risk of bleeding on Effient, would recommend switching to Plavix.  Discussed with pharmacy and interventional cardiology. Loaded with 300 mg Plavix 9/24 and then resuming 75 mg Plavix daily today.  Will monitor closely for further bleeding  Chronic systolic heart failure: EF 40 to 45% on echo 11/15/2022.  Presented with AKI, losartan on hold.  Not on beta-blocker due to bradycardia -Renal function improving, will monitor, hold off on losartan for now  Acute blood loss anemia: Due to GI bleed.  EGD did not show source of bleeding.  Suspected small bowel source per GI.  Okay with restarting DAPT 9/24, monitor for further bleeding.  Goal Hgb >8 given CAD   For questions or updates, please contact [facility] Please consult [email or web address] for contact info under        Signed, Little Ishikawa, MD  12/03/2022, 10:14 AM

## 2022-12-03 NOTE — Progress Notes (Signed)
   12/03/22 1538  TOC Brief Assessment  Insurance and Status Reviewed [facility] Medicare)  Patient has primary care physician Yes (Busy - Default status  Polite, Ronald, MD)  Home environment has been reviewed From home with Spouse  Prior level of function: somewhat independent  Prior/Current Home Services No current home services  Social Determinants of Health Reivew SDOH reviewed no interventions necessary  Readmission risk has been reviewed Yes (24%)  Transition of care needs transition of care needs identified, TOC will continue to follow   TOC will continue to follow patient for any additional discharge needs

## 2022-12-03 NOTE — Plan of Care (Signed)
  Problem: Activity: Goal: Ability to tolerate increased activity will improve Outcome: Progressing   Problem: Cardiac: Goal: Ability to achieve and maintain adequate cardiovascular perfusion will improve Outcome: Progressing   Problem: Education: Goal: Understanding of CV disease, CV risk reduction, and recovery process will improve Outcome: Progressing   Problem: Activity: Goal: Ability to return to baseline activity level will improve Outcome: Progressing   Problem: Cardiovascular: Goal: Ability to achieve and maintain adequate cardiovascular perfusion will improve Outcome: Progressing Goal: Vascular access site(s) Level 0-1 will be maintained Outcome: Progressing   Problem: Cardiovascular: Goal: Vascular access site(s) Level 0-1 will be maintained Outcome: Progressing

## 2022-12-03 NOTE — Progress Notes (Signed)
[facility] Progress Note  KEYSTON Rowland Sr. 77 y.o. 1945/06/02   Subjective: Large brown formed stool this morning and seen in toilet and NT witnessed. Sitting in bedside chair. Denies abdominal pain. Wife and daughter in room.  Objective: Vital signs: Vitals:   12/03/22 0648 12/03/22 0953  BP: (!) 151/75 118/84  Pulse: 65   Resp: 14   Temp: 97.9 F (36.6 C) 98.5 F (36.9 C)  SpO2: 97%     Physical Exam: Gen: alert, no acute distress, well-nourished, pleasant HEENT: anicteric sclera CV: RRR Chest: CTA B Abd: soft, nontender, nondistended, +BS Ext: no edema  Lab Results: Recent Labs    12/02/22 0438 12/03/22 0357  NA 135 136  K 4.0 3.8  CL 106 105  CO2 19* 19*  GLUCOSE 137* 140*  BUN 36* 32*  CREATININE 1.90* 1.78*  CALCIUM 9.3 9.3  MG 2.0 1.9  PHOS 4.7*  --    No results for input(s): "AST", "ALT", "ALKPHOS", "BILITOT", "PROT", "ALBUMIN" in the last 72 hours. Recent Labs    12/01/22 1132 12/02/22 0438 12/03/22 0357  WBC 8.2 7.3 6.6  NEUTROABS 6.2  --  4.1  HGB 7.5* 8.2* 7.6*  HCT 23.3* 24.7* 23.4*  MCV 97.9 98.8 97.5  PLT 292 226 219      Assessment/Plan: GI bleed - resolved. Brown stool this morning. S/P 1 U PRBCs this morning following Hgb 7.6. Post-transfusion CBC scheduled for this afternoon. Started on Plavix yesterday instead of Effient. GI bleeding has resolved and stable to go home today from GI standpoint but with drop in Hgb reasonable to keep inpt another day and d/c tomorrow. D/C on PPI 40 mg PO daily. F/U with GI in 4-6 weeks. Will sign off. Call if questions.   Shirley Friar 12/03/2022, 11:09 AM  Questions please call [phone] ID: Ricky Rowland., male   DOB: 1945/10/23, 77 y.o.   MRN: [id number]

## 2022-12-03 NOTE — TOC CAGE-AID Note (Signed)
Transition of Care (TOC) - CAGE-AID Screening   Patient Details  Name: Ricky LAPA Sr. MRN: [id number] Date of Birth: 1945/12/02  Transition of Care [facility]) CM/SW Contact:    Janora Norlander, RN Phone Number: [phone] 12/03/2022, 10:29 AM   Clinical Narrative: Pt initially came to the hospital due to having a fall due to a syncopal episode.  Pt reports that he quit smoking about 24 years ago.  Pt also states that he does not do drugs and does not drink alcohol currently.  No resources needed.  Screening complete.   CAGE-AID Screening:    Have You Ever Felt You Ought to Cut Down on Your Drinking or Drug Use?: No Have People Annoyed You By Critizing Your Drinking Or Drug Use?: No Have You Felt Bad Or Guilty About Your Drinking Or Drug Use?: No Have You Ever Had a Drink or Used Drugs First Thing In The Morning to Steady Your Nerves or to Get Rid of a Hangover?: No CAGE-AID Score: 0  Substance Abuse Education Offered: No

## 2022-12-04 DIAGNOSIS — I25118 Atherosclerotic heart disease of native coronary artery with other forms of angina pectoris: Secondary | ICD-10-CM | POA: Diagnosis not present

## 2022-12-04 DIAGNOSIS — K922 Gastrointestinal hemorrhage, unspecified: Secondary | ICD-10-CM | POA: Diagnosis not present

## 2022-12-04 DIAGNOSIS — I5022 Chronic systolic (congestive) heart failure: Secondary | ICD-10-CM | POA: Diagnosis not present

## 2022-12-04 LAB — TYPE AND SCREEN
ABO/RH(D): A POS
Antibody Screen: NEGATIVE
Unit division: 0
Unit division: 0

## 2022-12-04 LAB — BPAM RBC
Blood Product Expiration Date: 202410022359
Blood Product Expiration Date: 202410012359
ISSUE DATE / TIME: 202409250629
ISSUE DATE / TIME: 202409231712
Unit Type and Rh: 6200
Unit Type and Rh: 6200

## 2022-12-04 LAB — CBC WITH DIFFERENTIAL/PLATELET
Abs Immature Granulocytes: 0.03 10*3/uL (ref 0.00–0.07)
Basophils Absolute: 0 10*3/uL (ref 0.0–0.1)
Basophils Relative: 0 %
Eosinophils Absolute: 0.1 10*3/uL (ref 0.0–0.5)
Eosinophils Relative: 2 %
HCT: 26.6 % — ABNORMAL LOW (ref 39.0–52.0)
Hemoglobin: 8.7 g/dL — ABNORMAL LOW (ref 13.0–17.0)
Immature Granulocytes: 0 %
Lymphocytes Relative: 18 %
Lymphs Abs: 1.2 10*3/uL (ref 0.7–4.0)
MCH: 31.1 pg (ref 26.0–34.0)
MCHC: 32.7 g/dL (ref 30.0–36.0)
MCV: 95 fL (ref 80.0–100.0)
Monocytes Absolute: 0.6 10*3/uL (ref 0.1–1.0)
Monocytes Relative: 9 %
Neutro Abs: 4.8 10*3/uL (ref 1.7–7.7)
Neutrophils Relative %: 71 %
Platelets: 208 10*3/uL (ref 150–400)
RBC: 2.8 MIL/uL — ABNORMAL LOW (ref 4.22–5.81)
RDW: 14.3 % (ref 11.5–15.5)
WBC: 6.9 10*3/uL (ref 4.0–10.5)
nRBC: 0 % (ref 0.0–0.2)

## 2022-12-04 LAB — BASIC METABOLIC PANEL
Anion gap: 11 (ref 5–15)
BUN: 25 mg/dL — ABNORMAL HIGH (ref 8–23)
CO2: 21 mmol/L — ABNORMAL LOW (ref 22–32)
Calcium: 9.3 mg/dL (ref 8.9–10.3)
Chloride: 104 mmol/L (ref 98–111)
Creatinine, Ser: 1.57 mg/dL — ABNORMAL HIGH (ref 0.61–1.24)
GFR, Estimated: 45 mL/min — ABNORMAL LOW (ref >60)
Glucose, Bld: 136 mg/dL — ABNORMAL HIGH (ref 70–99)
Potassium: 3.9 mmol/L (ref 3.5–5.1)
Sodium: 136 mmol/L (ref 135–145)

## 2022-12-04 LAB — BRAIN NATRIURETIC PEPTIDE: B Natriuretic Peptide: 303.5 pg/mL — ABNORMAL HIGH (ref 0.0–100.0)

## 2022-12-04 LAB — MAGNESIUM: Magnesium: 1.9 mg/dL (ref 1.7–2.4)

## 2022-12-04 LAB — GLUCOSE, CAPILLARY: Glucose-Capillary: 131 mg/dL — ABNORMAL HIGH (ref 70–99)

## 2022-12-04 MED ORDER — AMLODIPINE BESYLATE 10 MG PO TABS
10.0000 mg | ORAL_TABLET | Freq: Every day | ORAL | Status: DC
  Administered 2022-12-04 – 2022-12-05 (×2): 10 mg via ORAL
  Filled 2022-12-04 (×2): qty 1

## 2022-12-04 MED ORDER — NITROGLYCERIN 0.4 MG SL SUBL: 0.4000 mg | SUBLINGUAL_TABLET | SUBLINGUAL | Status: DC | PRN

## 2022-12-04 MED ORDER — ALLOPURINOL 300 MG PO TABS
300.0000 mg | ORAL_TABLET | Freq: Every day | ORAL | Status: DC
  Administered 2022-12-04 – 2022-12-05 (×2): 300 mg via ORAL
  Filled 2022-12-04 (×2): qty 1

## 2022-12-04 NOTE — Plan of Care (Signed)
Problem: Education: Goal: Knowledge of General Education information will improve Description: Including pain rating scale, medication(s)/side effects and non-pharmacologic comfort measures Outcome: Progressing   Problem: Clinical Measurements: Goal: Diagnostic test results will improve Outcome: Progressing   Problem: Elimination: Goal: Will not experience complications related to bowel motility Outcome: Progressing

## 2022-12-04 NOTE — Progress Notes (Signed)
Rounding Note    Patient Name: Ricky HARIRI Sr. Date of Encounter: 12/04/2022  [facility] Cardiologist: Charlton Haws, MD   Subjective   BP 129/63. Cr improving (2.23 >1.90>1.78>1.57).  Hgb 8.7. Denies any chest pain or dyspnea  Inpatient Medications    Scheduled Meds:  allopurinol  300 mg Oral Daily   amLODipine  10 mg Oral Daily   aspirin EC  81 mg Oral Daily   clopidogrel  75 mg Oral Daily   docusate sodium  200 mg Oral BID   lidocaine  1 patch Transdermal Q24H   pantoprazole (PROTONIX) IV  40 mg Intravenous Q12H   Continuous Infusions:   PRN Meds: acetaminophen, hydrALAZINE, ipratropium-albuterol, nitroGLYCERIN, ondansetron (ZOFRAN) IV, polyethylene glycol, senna-docusate, traZODone   Vital Signs    Vitals:   12/03/22 2020 12/03/22 2326 12/04/22 0305 12/04/22 0847  BP: 112/77 (!) 160/82 (!) 165/71 129/63  Pulse: 62 66 (!) 56 79  Resp: 16 19 16 18   Temp: 98.6 F (37 C) 98.3 F (36.8 C) 98.2 F (36.8 C) 97.9 F (36.6 C)  TempSrc: Oral Oral Oral Oral  SpO2: 96% 97% 97% 99%    Intake/Output Summary (Last 24 hours) at 12/04/2022 1129 Last data filed at 12/03/2022 2200 Gross per 24 hour  Intake 480 ml  Output --  Net 480 ml      11/26/2022   11:57 AM 11/19/2022    6:02 AM 11/18/2022   12:22 PM  Last 3 Weights  Weight (lbs) 207 lb 3.2 oz 212 lb 11.9 oz 215 lb 13.3 oz  Weight (kg) 93.985 kg 96.5 kg 97.9 kg      Telemetry    NSR with PACs - Personally Reviewed  ECG    NO new ECG - Personally Reviewed  Physical Exam   GEN: No acute distress.   Neck: No JVD Cardiac: RRR, no murmurs, rubs, or gallops.  Respiratory: Clear to auscultation bilaterally. GI: Soft, nontender, non-distended  MS: No edema; No deformity. Neuro:  Nonfocal  Psych: Normal affect   Labs    High Sensitivity Troponin:   Recent Labs  Lab 11/14/22 1821 11/14/22 2035 11/15/22 0039 12/01/22 1132 12/01/22 1344  TROPONINIHS 144* 2,132* 5,707* 27* 29*      Chemistry Recent Labs  Lab 12/02/22 0438 12/03/22 0357 12/04/22 0612  NA 135 136 136  K 4.0 3.8 3.9  CL 106 105 104  CO2 19* 19* 21*  GLUCOSE 137* 140* 136*  BUN 36* 32* 25*  CREATININE 1.90* 1.78* 1.57*  CALCIUM 9.3 9.3 9.3  MG 2.0 1.9 1.9  GFRNONAA 35* 38* 45*  ANIONGAP 10 12 11     Lipids No results for input(s): "CHOL", "TRIG", "HDL", "LABVLDL", "LDLCALC", "CHOLHDL" in the last 168 hours.  Hematology Recent Labs  Lab 12/03/22 0357 12/03/22 1353 12/04/22 0612  WBC 6.6 7.3 6.9  RBC 2.40* 2.68* 2.80*  HGB 7.6* 8.9* 8.7*  HCT 23.4* 25.9* 26.6*  MCV 97.5 96.6 95.0  MCH 31.7 33.2 31.1  MCHC 32.5 34.4 32.7  RDW 13.8 14.6 14.3  PLT 219 201 208   Thyroid  Recent Labs  Lab 12/02/22 0435  TSH 2.870    BNP Recent Labs  Lab 12/03/22 0357 12/04/22 0612  BNP 281.2* 303.5*    DDimer  Recent Labs  Lab 12/01/22 1147  DDIMER 1.82*     Radiology    No results found.  Cardiac Studies     Patient Profile     77 y.o. 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 Nelson Chimes.   Assessment & Plan    CAD: s/p prox LAD DES placed 11/16/22.  Presented with GI bleed. -Continue aspirin 81 mg daily -Effient was held due to GI bleed.  EGD 9/24 does not show source of bleeding.  GI OK with restarting DAPT on 9/24.  Patient was started on Effient instead of Brilinta after PCI due to interaction with Xtandi; Plavix was not chosen due to high CAD burden.  However in setting of GI bleed and increased risk of bleeding on Effient, would recommend switching to Plavix.  Discussed with pharmacy and interventional cardiology. Loaded with 300 mg Plavix 9/24 and  then resuming 75 mg Plavix daily 9/25.  Will monitor closely for further bleeding  Chronic systolic heart failure: EF 40 to 45% on echo 11/15/2022.  Presented with AKI, losartan on hold.  Not on beta-blocker due to bradycardia -Renal function improving, will monitor, hold off on losartan for now  Acute blood loss anemia: Due to GI bleed.  EGD did not show source of bleeding.  Suspected small bowel source per GI.  Okay with restarting DAPT 9/24, monitor for further bleeding.  Goal Hgb >8 given CAD   For questions or updates, please contact [facility] Please consult [email or web address] for contact info under        Signed, Little Ishikawa, MD  12/04/2022, 11:29 AM

## 2022-12-04 NOTE — Care Management Important Message (Signed)
Important Message  Patient Details  Name: Ricky ALLRED Sr. MRN: [id number] Date of Birth: 07-10-1945   Important Message Given:  Yes - Medicare IM     Dorena Bodo 12/04/2022, 3:03 PM

## 2022-12-04 NOTE — Progress Notes (Signed)
PROGRESS NOTE                                                                                                                                                                                                             Patient Demographics:    Ricky Rowland, is a 77 y.o. male, DOB - 11-Jun-1945, SAY:[id number]  Outpatient Primary MD for the patient is [name], MD    LOS - 3  Admit date - 12/01/2022    Chief Complaint  Patient presents with   Fall       Brief Narrative (HPI from H&P)   77 y.o. male with medical history significant of CAD status post recent PCI September 2024 on aspirin and Effient, HLD, HTN, MGUS, CKD 3B comes to the ED for syncopal episode.  Patient states he was letting the dogs out earlier today when he got dizzy and lightheaded.  Came to the ER where he was found to have severe anemia, he also reports history of melena since he was discharged from the hospital on DAPT for his cardiac stent, he has had 3-4 episodes of melena since then.  He was admitted for symptomatic anemia, possible upper GI bleed and further workup.   Subjective:   Patient in bed, appears comfortable, denies any headache, no fever, no chest pain or pressure, no shortness of breath , no abdominal pain. No focal weakness.   Assessment  & Plan :   Symptomatic acute upper GI bleed related blood loss anemia.  In a patient with recent coronary stenting and DAPT, discharged on 11/20/2022, has been having melanotic stools intermittently since then.  Has received 1 unit of packed RBC, IV PPI, DAPT per Cards & GI, both following.  EGD done was unremarkable on 12/02/2022 by [facility], DAPT resumed upon GI recommendation on 12/02/2022, 1 your unit of packed RBC on 12/03/2022, transfusion CBC stable continue to monitor, do not think he is actively bleeding but probably still old blood loss is being calibrated, monitor on DAPT and PPI, if any evidence  of new bleeding then GI will do appropriate testing as needed.  CAD s/p PCI 11/16/2022.  Currently on DAPT again since 12/02/2022, cardiology on board, chest pain-free, outpatient Repatha infusions.  History of bradycardia.  Avoiding beta-blocker and rate controlling agents.  Cardiology on board will continue to follow with  cardiology postdischarge, stable TSH  Hypertension.  Add Norvasc and monitor.    AKI with history of CKD stage IIIb  - Baseline creatinine 1.7, admission creatinine 2.23.  Gentle fluids & transfuse, cloase to baseline now.  Syncope fall with scalp laceration on his left side.  CT head and C-spine unremarkable, sutures placed by EDP.  History of ischemic cardiomyopathy with reduced EF, 40%  - Not in any acute exacerbation.  Appears compensated, defer management to cardiology.      Condition - Fair  Family Communication  : Wife 12/03/2022  Code Status :  Full  Consults  :  GI, Cards  PUD Prophylaxis : PPI   Procedures  :     CT head and C-spine.  1. No acute intracranial abnormality. 2. Chronic lacunar infarct right basal ganglia. 3. Loss of cervical lordosis. This can be related to patient positioning, muscle spasm or soft tissue injury  EGD.  Done by [name] 12/02/2022, unremarkable.      Disposition Plan  :    Status is: Inpatient  DVT Prophylaxis  :    Place TED hose Start: 12/01/22 1404 SCDs Start: 12/01/22 1403    Lab Results  Component Value Date   PLT 208 12/04/2022    Diet :  Diet Order             Diet Heart Room service appropriate? Yes; Fluid consistency: Thin  Diet effective now                    Inpatient Medications  Scheduled Meds:  allopurinol  300 mg Oral Daily   amLODipine  10 mg Oral Daily   aspirin EC  81 mg Oral Daily   clopidogrel  75 mg Oral Daily   docusate sodium  200 mg Oral BID   lidocaine  1 patch Transdermal Q24H   magnesium hydroxide  30 mL Oral BID   pantoprazole (PROTONIX) IV  40 mg Intravenous  Q12H   Continuous Infusions:   PRN Meds:.acetaminophen, hydrALAZINE, ipratropium-albuterol, nitroGLYCERIN, ondansetron (ZOFRAN) IV, polyethylene glycol, senna-docusate, traZODone  Antibiotics  :    Anti-infectives (From admission, onward)    None         Objective:   Vitals:   12/03/22 1600 12/03/22 2020 12/03/22 2326 12/04/22 0305  BP: (!) 141/93 112/77 (!) 160/82 (!) 165/71  Pulse:  62 66 (!) 56  Resp:  16 19 16   Temp:  98.6 F (37 C) 98.3 F (36.8 C) 98.2 F (36.8 C)  TempSrc:  Oral Oral Oral  SpO2:  96% 97% 97%    Wt Readings from Last 3 Encounters:  11/26/22 94 kg  11/19/22 96.5 kg  08/12/22 94.8 kg     Intake/Output Summary (Last 24 hours) at 12/04/2022 0744 Last data filed at 12/03/2022 2200 Gross per 24 hour  Intake 1136.67 ml  Output --  Net 1136.67 ml     Physical Exam  Awake Alert, No new F.N deficits, Normal affect [place].AT,PERRAL Supple Neck, No JVD,   Symmetrical Chest wall movement, Good air movement bilaterally, CTAB RRR,No Gallops, Rubs or new Murmurs,  +ve B.Sounds, Abd Soft, No tenderness,   No Cyanosis, Clubbing or edema        Data Review:    Recent Labs  Lab 12/01/22 1132 12/02/22 0438 12/03/22 0357 12/03/22 1353 12/04/22 0612  WBC 8.2 7.3 6.6 7.3 6.9  HGB 7.5* 8.2* 7.6* 8.9* 8.7*  HCT 23.3* 24.7* 23.4* 25.9* 26.6*  PLT 292 226  219 201 208  MCV 97.9 98.8 97.5 96.6 95.0  MCH 31.5 32.8 31.7 33.2 31.1  MCHC 32.2 33.2 32.5 34.4 32.7  RDW 13.1 14.0 13.8 14.6 14.3  LYMPHSABS 1.4  --  1.6 1.3 1.2  MONOABS 0.6  --  0.7 0.7 0.6  EOSABS 0.0  --  0.1 0.1 0.1  BASOSABS 0.0  --  0.0 0.0 0.0    Recent Labs  Lab 12/01/22 1132 12/01/22 1147 12/02/22 0435 12/02/22 0438 12/02/22 0656 12/03/22 0357 12/04/22 0612  NA 136  --   --  135  --  136 136  K 5.0  --   --  4.0  --  3.8 3.9  CL 103  --   --  106  --  105 104  CO2 20*  --   --  19*  --  19* 21*  ANIONGAP 13  --   --  10  --  12 11  GLUCOSE 149*  --   --  137*  --   140* 136*  BUN 43*  --   --  36*  --  32* 25*  CREATININE 2.23*  --   --  1.90*  --  1.78* 1.57*  DDIMER  --  1.82*  --   --   --   --   --   INR  --   --   --   --  1.0  --   --   TSH  --   --  2.870  --   --   --   --   BNP  --   --   --   --   --  281.2* 303.5*  MG  --   --   --  2.0  --  1.9 1.9  CALCIUM 10.2  --   --  9.3  --  9.3 9.3      Recent Labs  Lab 12/01/22 1132 12/01/22 1147 12/02/22 0435 12/02/22 0438 12/02/22 0656 12/03/22 0357 12/04/22 0612  DDIMER  --  1.82*  --   --   --   --   --   INR  --   --   --   --  1.0  --   --   TSH  --   --  2.870  --   --   --   --   BNP  --   --   --   --   --  281.2* 303.5*  MG  --   --   --  2.0  --  1.9 1.9  CALCIUM 10.2  --   --  9.3  --  9.3 9.3    Recent Labs    12/02/22 0656  VITAMINB12 231  FOLATE 20.5  FERRITIN 37  TIBC 430  IRON 89  RETICCTPCT 3.5*   Radiology Reports CT Head Wo Contrast  Result Date: 12/01/2022 CLINICAL DATA:  Patient fell off the bed hitting left side of his head on the floor. EXAM: CT HEAD WITHOUT CONTRAST CT CERVICAL SPINE WITHOUT CONTRAST TECHNIQUE: Multidetector CT imaging of the head and cervical spine was performed following the standard protocol without intravenous contrast. Multiplanar CT image reconstructions of the cervical spine were also generated. RADIATION DOSE REDUCTION: This exam was performed according to the departmental dose-optimization program which includes automated exposure control, adjustment of the mA and/or kV according to patient size and/or use of iterative reconstruction technique. COMPARISON:  Brain MRI 02/03/2015 FINDINGS: CT HEAD FINDINGS  Brain: There is no evidence for acute hemorrhage, hydrocephalus, mass lesion, or abnormal extra-axial fluid collection. No definite CT evidence for acute infarction. Chronic lacunar infarct noted right basal ganglia. Vascular: No hyperdense vessel or unexpected calcification. Skull: No evidence for fracture. No worrisome lytic or  sclerotic lesion. Sinuses/Orbits: The visualized paranasal sinuses and mastoid air cells are clear. Visualized portions of the globes and intraorbital fat are unremarkable. Other: None. CT CERVICAL SPINE FINDINGS Alignment: Straightening of normal cervical lordosis. No subluxation. Skull base and vertebrae: No acute fracture. No primary bone lesion or focal pathologic process. Soft tissues and spinal canal: No prevertebral fluid or swelling. No visible canal hematoma. Disc levels: Intervertebral disc spaces are preserved. The facets are well aligned bilaterally. Upper chest: Unremarkable. Other: None. IMPRESSION: 1. No acute intracranial abnormality. 2. Chronic lacunar infarct right basal ganglia. 3. Loss of cervical lordosis. This can be related to patient positioning, muscle spasm or soft tissue injury. Electronically Signed   By: Kennith Center M.D.   On: 12/01/2022 13:03   CT Cervical Spine Wo Contrast  Result Date: 12/01/2022 CLINICAL DATA:  Patient fell off the bed hitting left side of his head on the floor. EXAM: CT HEAD WITHOUT CONTRAST CT CERVICAL SPINE WITHOUT CONTRAST TECHNIQUE: Multidetector CT imaging of the head and cervical spine was performed following the standard protocol without intravenous contrast. Multiplanar CT image reconstructions of the cervical spine were also generated. RADIATION DOSE REDUCTION: This exam was performed according to the departmental dose-optimization program which includes automated exposure control, adjustment of the mA and/or kV according to patient size and/or use of iterative reconstruction technique. COMPARISON:  Brain MRI 02/03/2015 FINDINGS: CT HEAD FINDINGS Brain: There is no evidence for acute hemorrhage, hydrocephalus, mass lesion, or abnormal extra-axial fluid collection. No definite CT evidence for acute infarction. Chronic lacunar infarct noted right basal ganglia. Vascular: No hyperdense vessel or unexpected calcification. Skull: No evidence for fracture.  No worrisome lytic or sclerotic lesion. Sinuses/Orbits: The visualized paranasal sinuses and mastoid air cells are clear. Visualized portions of the globes and intraorbital fat are unremarkable. Other: None. CT CERVICAL SPINE FINDINGS Alignment: Straightening of normal cervical lordosis. No subluxation. Skull base and vertebrae: No acute fracture. No primary bone lesion or focal pathologic process. Soft tissues and spinal canal: No prevertebral fluid or swelling. No visible canal hematoma. Disc levels: Intervertebral disc spaces are preserved. The facets are well aligned bilaterally. Upper chest: Unremarkable. Other: None. IMPRESSION: 1. No acute intracranial abnormality. 2. Chronic lacunar infarct right basal ganglia. 3. Loss of cervical lordosis. This can be related to patient positioning, muscle spasm or soft tissue injury. Electronically Signed   By: Kennith Center M.D.   On: 12/01/2022 13:03   DG Chest Portable 1 View  Result Date: 12/01/2022 CLINICAL DATA:  Shortness of breath. EXAM: PORTABLE CHEST 1 VIEW COMPARISON:  11/14/2022 FINDINGS: The lungs are clear without focal pneumonia, edema, pneumothorax or pleural effusion. Cardiopericardial silhouette is at upper limits of normal for size. No acute bony abnormality. Telemetry leads overlie the chest. IMPRESSION: No active disease. Electronically Signed   By: Kennith Center M.D.   On: 12/01/2022 12:58      Signature  -   Susa Raring M.D on 12/04/2022 at 7:44 AM   -  To page go to [email or web address]  Okay sounds good

## 2022-12-04 NOTE — Progress Notes (Signed)
Mobility Specialist Progress Note;   12/04/22 1200  Mobility  Activity Ambulated independently in hallway  Level of Assistance Independent  Assistive Device None  Distance Ambulated (ft) 500 ft  Activity Response Tolerated well  Mobility Referral Yes  $Mobility charge 1 Mobility  Mobility Specialist Start Time (ACUTE ONLY) 1200  Mobility Specialist Stop Time (ACUTE ONLY) 1210  Mobility Specialist Time Calculation (min) (ACUTE ONLY) 10 min   Pt agreeable to mobility. When asked about pain in neck, pt rated it 4/10 after pain meds and hot packs. No physical assistance required for ambulation. Pt left in bed with all needs met and new hot pack.  Caesar Bookman Mobility Specialist Please contact via SecureChat or Rehab Office [phone]

## 2022-12-04 NOTE — Plan of Care (Signed)
Problem: Education: Goal: Understanding of cardiac disease, CV risk reduction, and recovery process will improve Outcome: Progressing Goal: Individualized Educational Video(s) Outcome: Progressing   Problem: Activity: Goal: Ability to tolerate increased activity will improve Outcome: Progressing   Problem: Cardiac: Goal: Ability to achieve and maintain adequate cardiovascular perfusion will improve Outcome: Progressing   Problem: Health Behavior/Discharge Planning: Goal: Ability to safely manage health-related needs after discharge will improve Outcome: Progressing   Problem: Education: Goal: Understanding of CV disease, CV risk reduction, and recovery process will improve Outcome: Progressing Goal: Individualized Educational Video(s) Outcome: Progressing   Problem: Activity: Goal: Ability to return to baseline activity level will improve Outcome: Progressing   Problem: Cardiovascular: Goal: Ability to achieve and maintain adequate cardiovascular perfusion will improve Outcome: Progressing Goal: Vascular access site(s) Level 0-1 will be maintained Outcome: Progressing   Problem: Health Behavior/Discharge Planning: Goal: Ability to safely manage health-related needs after discharge will improve Outcome: Progressing   Problem: Education: Goal: Knowledge of General Education information will improve Description: Including pain rating scale, medication(s)/side effects and non-pharmacologic comfort measures Outcome: Progressing   Problem: Health Behavior/Discharge Planning: Goal: Ability to manage health-related needs will improve Outcome: Progressing   Problem: Clinical Measurements: Goal: Ability to maintain clinical measurements within normal limits will improve Outcome: Progressing Goal: Will remain free from infection Outcome: Progressing Goal: Diagnostic test results will improve Outcome: Progressing Goal: Respiratory complications will improve Outcome:  Progressing Goal: Cardiovascular complication will be avoided Outcome: Progressing   Problem: Activity: Goal: Risk for activity intolerance will decrease Outcome: Progressing   Problem: Nutrition: Goal: Adequate nutrition will be maintained Outcome: Progressing   Problem: Coping: Goal: Level of anxiety will decrease Outcome: Progressing   Problem: Elimination: Goal: Will not experience complications related to bowel motility Outcome: Progressing Goal: Will not experience complications related to urinary retention Outcome: Progressing   Problem: Pain Managment: Goal: General experience of comfort will improve Outcome: Progressing   Problem: Safety: Goal: Ability to remain free from injury will improve Outcome: Progressing   Problem: Skin Integrity: Goal: Risk for impaired skin integrity will decrease Outcome: Progressing   Problem: Education: Goal: Understanding of cardiac disease, CV risk reduction, and recovery process will improve Outcome: Progressing   Problem: Education: Goal: Individualized Educational Video(s) Outcome: Progressing   Problem: Education: Goal: Individualized Educational Video(s) Outcome: Progressing   Problem: Activity: Goal: Ability to tolerate increased activity will improve Outcome: Progressing   Problem: Activity: Goal: Ability to tolerate increased activity will improve Outcome: Progressing   Problem: Cardiac: Goal: Ability to achieve and maintain adequate cardiovascular perfusion will improve Outcome: Progressing   Problem: Cardiac: Goal: Ability to achieve and maintain adequate cardiovascular perfusion will improve Outcome: Progressing   Problem: Health Behavior/Discharge Planning: Goal: Ability to safely manage health-related needs after discharge will improve Outcome: Progressing   Problem: Health Behavior/Discharge Planning: Goal: Ability to safely manage health-related needs after discharge will improve Outcome:  Progressing   Problem: Education: Goal: Understanding of CV disease, CV risk reduction, and recovery process will improve Outcome: Progressing   Problem: Education: Goal: Individualized Educational Video(s) Outcome: Progressing   Problem: Education: Goal: Individualized Educational Video(s) Outcome: Progressing   Problem: Cardiovascular: Goal: Vascular access site(s) Level 0-1 will be maintained Outcome: Progressing   Problem: Health Behavior/Discharge Planning: Goal: Ability to safely manage health-related needs after discharge will improve Outcome: Progressing   Problem: Clinical Measurements: Goal: Respiratory complications will improve Outcome: Progressing   Problem: Clinical Measurements: Goal: Diagnostic test results will improve Outcome: Progressing   Problem:  Clinical Measurements: Goal: Will remain free from infection Outcome: Progressing   Problem: Clinical Measurements: Goal: Respiratory complications will improve Outcome: Progressing   Problem: Clinical Measurements: Goal: Cardiovascular complication will be avoided Outcome: Progressing   Problem: Elimination: Goal: Will not experience complications related to urinary retention Outcome: Progressing   Problem: Elimination: Goal: Will not experience complications related to bowel motility Outcome: Progressing   Problem: Coping: Goal: Level of anxiety will decrease Outcome: Progressing   Problem: Nutrition: Goal: Adequate nutrition will be maintained Outcome: Progressing   Problem: Nutrition: Goal: Adequate nutrition will be maintained Outcome: Progressing   Problem: Skin Integrity: Goal: Risk for impaired skin integrity will decrease Outcome: Progressing   Problem: Skin Integrity: Goal: Risk for impaired skin integrity will decrease Outcome: Progressing   Problem: Safety: Goal: Ability to remain free from injury will improve Outcome: Progressing

## 2022-12-05 ENCOUNTER — Inpatient Hospital Stay (HOSPITAL_COMMUNITY)
Admit: 2022-12-05 | Discharge: 2022-12-05 | Disposition: A | Discharge status: Home / Self Care | Payer: Medicare Other | Attending: Student

## 2022-12-05 ENCOUNTER — Encounter (HOSPITAL_COMMUNITY): Payer: Self-pay | Admitting: Gastroenterology

## 2022-12-05 ENCOUNTER — Telehealth (HOSPITAL_COMMUNITY): Payer: Self-pay

## 2022-12-05 ENCOUNTER — Other Ambulatory Visit (HOSPITAL_COMMUNITY): Payer: Self-pay

## 2022-12-05 DIAGNOSIS — R55 Syncope and collapse: Secondary | ICD-10-CM | POA: Diagnosis not present

## 2022-12-05 DIAGNOSIS — I25118 Atherosclerotic heart disease of native coronary artery with other forms of angina pectoris: Secondary | ICD-10-CM | POA: Diagnosis not present

## 2022-12-05 DIAGNOSIS — K922 Gastrointestinal hemorrhage, unspecified: Secondary | ICD-10-CM | POA: Diagnosis not present

## 2022-12-05 DIAGNOSIS — I5022 Chronic systolic (congestive) heart failure: Secondary | ICD-10-CM | POA: Diagnosis not present

## 2022-12-05 LAB — CBC WITH DIFFERENTIAL/PLATELET
Abs Immature Granulocytes: 0.03 10*3/uL (ref 0.00–0.07)
Basophils Absolute: 0 10*3/uL (ref 0.0–0.1)
Basophils Relative: 0 %
Eosinophils Absolute: 0.1 10*3/uL (ref 0.0–0.5)
Eosinophils Relative: 2 %
HCT: 29.9 % — ABNORMAL LOW (ref 39.0–52.0)
Hemoglobin: 9.9 g/dL — ABNORMAL LOW (ref 13.0–17.0)
Immature Granulocytes: 0 %
Lymphocytes Relative: 23 %
Lymphs Abs: 1.6 10*3/uL (ref 0.7–4.0)
MCH: 32.1 pg (ref 26.0–34.0)
MCHC: 33.1 g/dL (ref 30.0–36.0)
MCV: 97.1 fL (ref 80.0–100.0)
Monocytes Absolute: 0.5 10*3/uL (ref 0.1–1.0)
Monocytes Relative: 7 %
Neutro Abs: 4.7 10*3/uL (ref 1.7–7.7)
Neutrophils Relative %: 68 %
Platelets: 214 10*3/uL (ref 150–400)
RBC: 3.08 MIL/uL — ABNORMAL LOW (ref 4.22–5.81)
RDW: 14 % (ref 11.5–15.5)
WBC: 7.1 10*3/uL (ref 4.0–10.5)
nRBC: 0 % (ref 0.0–0.2)

## 2022-12-05 LAB — BASIC METABOLIC PANEL
Anion gap: 11 (ref 5–15)
BUN: 26 mg/dL — ABNORMAL HIGH (ref 8–23)
CO2: 22 mmol/L (ref 22–32)
Calcium: 9.7 mg/dL (ref 8.9–10.3)
Chloride: 102 mmol/L (ref 98–111)
Creatinine, Ser: 1.65 mg/dL — ABNORMAL HIGH (ref 0.61–1.24)
GFR, Estimated: 42 mL/min — ABNORMAL LOW (ref >60)
Glucose, Bld: 139 mg/dL — ABNORMAL HIGH (ref 70–99)
Potassium: 4 mmol/L (ref 3.5–5.1)
Sodium: 135 mmol/L (ref 135–145)

## 2022-12-05 LAB — GLUCOSE, CAPILLARY: Glucose-Capillary: 129 mg/dL — ABNORMAL HIGH (ref 70–99)

## 2022-12-05 MED ORDER — CLOPIDOGREL BISULFATE 75 MG PO TABS
75.0000 mg | ORAL_TABLET | Freq: Every day | ORAL | 0 refills | Status: DC
  Filled 2022-12-05: qty 30, 30d supply, fill #0

## 2022-12-05 MED ORDER — POLYETHYLENE GLYCOL 3350 17 GM/SCOOP PO POWD
17.0000 g | Freq: Every day | ORAL | 0 refills | Status: AC | PRN
  Filled 2022-12-05: qty 238, 14d supply, fill #0

## 2022-12-05 MED ORDER — AMLODIPINE BESYLATE 10 MG PO TABS
10.0000 mg | ORAL_TABLET | Freq: Every day | ORAL | 0 refills | Status: DC
  Filled 2022-12-05: qty 30, 30d supply, fill #0

## 2022-12-05 MED ORDER — DOCUSATE SODIUM 100 MG PO CAPS
200.0000 mg | ORAL_CAPSULE | Freq: Every day | ORAL | 0 refills | Status: AC | PRN
  Filled 2022-12-05: qty 100, 50d supply, fill #0

## 2022-12-05 MED ORDER — PANTOPRAZOLE SODIUM 40 MG PO TBEC
40.0000 mg | DELAYED_RELEASE_TABLET | Freq: Two times a day (BID) | ORAL | 1 refills | Status: DC
  Filled 2022-12-05: qty 60, 30d supply, fill #0

## 2022-12-05 NOTE — Telephone Encounter (Addendum)
Pt aware, agreeable, and verbalized understanding  Has been in hospital, Losartan was stopped while in hospital, instructed him not to take it   ----- Message from [facility] sent at 11/26/2022  4:57 PM EDT ----- Labs show slight increase in SCr (measure of kidney function) and K level is also elevated. Instruct Patient to STOP Losartan and to monitor BP at home and to keep a log. Instruct him to notify us if his Systolic BP is persistently > 161. Will need repeat BMP on Friday (can get at PCP office).  Please order PYP Scan to assess for amyloid.   Hgb still low but stable. Iron levels low. Instruct him to discuss starting iron supplement when he f/us w/ PCP on Friday.

## 2022-12-05 NOTE — TOC Transition Note (Signed)
Transition of Care [facility]) - CM/SW Discharge Note   Patient Details  Name: Ricky DAILY Sr. MRN: [id number] Date of Birth: 11-05-1945  [facility] CM/SW Contact:  Gordy Clement, RN Phone Number: 12/05/2022, 9:23 AM   Clinical Narrative:    Patient will DC to home today with Spouse.  No recommendations for follow up PT or OT/DME.  Patient will follow up as instructed on AVS.   No additional TOC needs          Patient Goals and CMS Choice      Discharge Placement                         Discharge Plan and Services Additional resources added to the After Visit Summary for                                       Social Determinants of Health (SDOH) Interventions SDOH Screenings   Food Insecurity: No Food Insecurity (12/01/2022)  Housing: Low Risk  (12/01/2022)  Transportation Needs: No Transportation Needs (12/01/2022)  Utilities: Not At Risk (12/01/2022)  Alcohol Screen: Low Risk  (11/18/2022)  Depression (PHQ2-9): Low Risk  (05/26/2022)  Financial Resource Strain: Low Risk  (11/18/2022)  Tobacco Use: Medium Risk (12/02/2022)     Readmission Risk Interventions     No data to display

## 2022-12-05 NOTE — Discharge Summary (Signed)
Ricky [place]:[id number] DOB: 06-05-1945 DOA: 12/01/2022  PCP: [name], [place]  Admit date: 12/01/2022  Discharge date: 12/05/2022  Admitted From: Home   Disposition:  Home   Recommendations for Outpatient Follow-up:   Follow up with PCP in 1-2 weeks  PCP Please obtain BMP/CBC, 2 view CXR in 1week,  (see Discharge instructions)   PCP Please follow up on the following pending results:    Home Health: None   Equipment/Devices: None  Consultations: Cards, GI Discharge Condition: Stable    CODE STATUS: Full    Diet Recommendation: Heart Healthy     Chief Complaint  Patient presents with   Fall     Brief history of present illness from the day of admission and additional interim summary    77 y.o. male with medical history significant of CAD status post recent PCI September 2024 on aspirin and Effient, HLD, HTN, MGUS, CKD 3B comes to the ED for syncopal episode.  Patient states he was letting the dogs out earlier today when he got dizzy and lightheaded.  Came to the ER where he was found to have severe anemia, he also reports history of melena since he was discharged from the hospital on DAPT for his cardiac stent, he has had 3-4 episodes of melena since then.  He was admitted for symptomatic anemia, possible upper GI bleed and further workup.                                                                   Hospital Course   Symptomatic acute upper GI bleed related blood loss anemia.  In a patient with recent coronary stenting and DAPT, discharged on 11/20/2022, has been having melanotic stools intermittently since then.  Has received 1 unit of packed RBC, IV PPI, DAPT per Cards & GI, both following.  EGD done was unremarkable on 12/02/2022 by [facility], DAPT resumed upon GI recommendation on 12/02/2022,  1 your unit of packed RBC on 12/03/2022, transfusion CBC stable continue to monitor, main stable on DAPT and PPI combination for 48 hours, H&H gradually rising, no further melena will be discharged home on DAPT, note pain being continued and Effient has been switched to Plavix by cardiology, post discharge will follow-up with both GI and cardiology.  Bleeding most likely small bowel related.  Defer further management to GI outpatient.  Case was discussed with Dr. Bosie Clos GI in detail on 12/03/2022.   CAD s/p PCI 11/16/2022.  Currently on DAPT again since 12/02/2022, cardiology on board, chest pain-free, outpatient Repatha infusions.   History of bradycardia.  Avoiding beta-blocker and rate controlling agents.  Cardiology on board will continue to follow with cardiology postdischarge, stable TSH, he will also get a 30-day event monitor postdischarge by his cardiologist.   Hypertension.  Ricky [place]:[id number] DOB: 06-05-1945 DOA: 12/01/2022  PCP: [name], [place]  Admit date: 12/01/2022  Discharge date: 12/05/2022  Admitted From: Home   Disposition:  Home   Recommendations for Outpatient Follow-up:   Follow up with PCP in 1-2 weeks  PCP Please obtain BMP/CBC, 2 view CXR in 1week,  (see Discharge instructions)   PCP Please follow up on the following pending results:    Home Health: None   Equipment/Devices: None  Consultations: Cards, GI Discharge Condition: Stable    CODE STATUS: Full    Diet Recommendation: Heart Healthy     Chief Complaint  Patient presents with   Fall     Brief history of present illness from the day of admission and additional interim summary    77 y.o. male with medical history significant of CAD status post recent PCI September 2024 on aspirin and Effient, HLD, HTN, MGUS, CKD 3B comes to the ED for syncopal episode.  Patient states he was letting the dogs out earlier today when he got dizzy and lightheaded.  Came to the ER where he was found to have severe anemia, he also reports history of melena since he was discharged from the hospital on DAPT for his cardiac stent, he has had 3-4 episodes of melena since then.  He was admitted for symptomatic anemia, possible upper GI bleed and further workup.                                                                   Hospital Course   Symptomatic acute upper GI bleed related blood loss anemia.  In a patient with recent coronary stenting and DAPT, discharged on 11/20/2022, has been having melanotic stools intermittently since then.  Has received 1 unit of packed RBC, IV PPI, DAPT per Cards & GI, both following.  EGD done was unremarkable on 12/02/2022 by [facility], DAPT resumed upon GI recommendation on 12/02/2022,  1 your unit of packed RBC on 12/03/2022, transfusion CBC stable continue to monitor, main stable on DAPT and PPI combination for 48 hours, H&H gradually rising, no further melena will be discharged home on DAPT, note pain being continued and Effient has been switched to Plavix by cardiology, post discharge will follow-up with both GI and cardiology.  Bleeding most likely small bowel related.  Defer further management to GI outpatient.  Case was discussed with Dr. Bosie Clos GI in detail on 12/03/2022.   CAD s/p PCI 11/16/2022.  Currently on DAPT again since 12/02/2022, cardiology on board, chest pain-free, outpatient Repatha infusions.   History of bradycardia.  Avoiding beta-blocker and rate controlling agents.  Cardiology on board will continue to follow with cardiology postdischarge, stable TSH, he will also get a 30-day event monitor postdischarge by his cardiologist.   Hypertension.  I-->III, MSA 10.6 mm2        Loss of one septal branch with mild burning sensation. No conduction abnormality noted, in fact rate is improved from 40 bpm to >60 bpm, not requiring any temporary pacing. Recommend overnight IV NTG. Ricky Negus,  [place] Pager: [phone] Office: [phone]   ECHOCARDIOGRAM COMPLETE  Result Date: 11/15/2022    ECHOCARDIOGRAM REPORT   Patient Name:   Ricky [place]. Date of Exam: 11/15/2022 Medical Rec #:  [id number]             Height:       75.0 in Accession #:    [id number]            Weight:       210.0 lb Date of Birth:  01-11-1946             BSA:          2.240 m Patient Age:    77 years              BP:           102/85 mmHg Patient Gender: M                     HR:           71 bpm. Exam Location:  Inpatient Procedure: 2D Echo, Cardiac Doppler, Color Doppler and Strain Analysis Indications:    NSTEMI  History:        Patient has prior history of Echocardiogram examinations, most                 recent 11/06/2017. Cancer; Risk Factors:Hypertension,                 Dyslipidemia and Diabetes.  Sonographer:    Delcie Roch RDCS Referring Phys: [id number] Elmon Kirschner  Sonographer Comments: Global longitudinal strain was attempted. IMPRESSIONS  1. Septal, apical distal anterior, mid/distal inferior wall hypokinesis . Left ventricular ejection fraction, by estimation, is 40 to 45%. The left ventricle has mildly decreased function. The left ventricle demonstrates regional wall motion abnormalities (see scoring diagram/findings for description). The left ventricular internal cavity size was mildly dilated. There is mild left ventricular hypertrophy. Left ventricular diastolic parameters are consistent with Grade I diastolic dysfunction (impaired relaxation).  2. Right ventricular systolic function is normal. The right ventricular size is normal. There is moderately elevated pulmonary artery systolic pressure.  3. Left atrial size was mildly dilated.  4. The mitral valve is normal in structure. Mild mitral valve regurgitation. No evidence of mitral stenosis.  5. The aortic valve is tricuspid. There is mild calcification of the aortic valve. Aortic valve regurgitation is mild. Aortic valve sclerosis is present, with  no evidence of aortic valve stenosis.  6. The inferior vena cava is normal in size with greater than 50% respiratory variability, suggesting right atrial pressure of 3 mmHg. FINDINGS  Left Ventricle: Septal, apical distal anterior, mid/distal inferior wall hypokinesis. Left ventricular ejection fraction, by estimation, is 40 to 45%. The left ventricle has mildly decreased function. The left ventricle demonstrates regional wall motion  abnormalities. The left ventricular internal cavity size was mildly dilated. There is mild left ventricular hypertrophy. Left ventricular diastolic parameters are consistent with Grade I diastolic dysfunction (impaired relaxation). Right Ventricle: The right ventricular size is normal. No increase in right ventricular wall thickness. Right ventricular systolic function is normal. There is moderately elevated pulmonary artery systolic pressure. The tricuspid regurgitant velocity  Ricky [place]:[id number] DOB: 06-05-1945 DOA: 12/01/2022  PCP: [name], [place]  Admit date: 12/01/2022  Discharge date: 12/05/2022  Admitted From: Home   Disposition:  Home   Recommendations for Outpatient Follow-up:   Follow up with PCP in 1-2 weeks  PCP Please obtain BMP/CBC, 2 view CXR in 1week,  (see Discharge instructions)   PCP Please follow up on the following pending results:    Home Health: None   Equipment/Devices: None  Consultations: Cards, GI Discharge Condition: Stable    CODE STATUS: Full    Diet Recommendation: Heart Healthy     Chief Complaint  Patient presents with   Fall     Brief history of present illness from the day of admission and additional interim summary    77 y.o. male with medical history significant of CAD status post recent PCI September 2024 on aspirin and Effient, HLD, HTN, MGUS, CKD 3B comes to the ED for syncopal episode.  Patient states he was letting the dogs out earlier today when he got dizzy and lightheaded.  Came to the ER where he was found to have severe anemia, he also reports history of melena since he was discharged from the hospital on DAPT for his cardiac stent, he has had 3-4 episodes of melena since then.  He was admitted for symptomatic anemia, possible upper GI bleed and further workup.                                                                   Hospital Course   Symptomatic acute upper GI bleed related blood loss anemia.  In a patient with recent coronary stenting and DAPT, discharged on 11/20/2022, has been having melanotic stools intermittently since then.  Has received 1 unit of packed RBC, IV PPI, DAPT per Cards & GI, both following.  EGD done was unremarkable on 12/02/2022 by [facility], DAPT resumed upon GI recommendation on 12/02/2022,  1 your unit of packed RBC on 12/03/2022, transfusion CBC stable continue to monitor, main stable on DAPT and PPI combination for 48 hours, H&H gradually rising, no further melena will be discharged home on DAPT, note pain being continued and Effient has been switched to Plavix by cardiology, post discharge will follow-up with both GI and cardiology.  Bleeding most likely small bowel related.  Defer further management to GI outpatient.  Case was discussed with Dr. Bosie Clos GI in detail on 12/03/2022.   CAD s/p PCI 11/16/2022.  Currently on DAPT again since 12/02/2022, cardiology on board, chest pain-free, outpatient Repatha infusions.   History of bradycardia.  Avoiding beta-blocker and rate controlling agents.  Cardiology on board will continue to follow with cardiology postdischarge, stable TSH, he will also get a 30-day event monitor postdischarge by his cardiologist.   Hypertension.  I-->III, MSA 10.6 mm2        Loss of one septal branch with mild burning sensation. No conduction abnormality noted, in fact rate is improved from 40 bpm to >60 bpm, not requiring any temporary pacing. Recommend overnight IV NTG. Ricky Negus,  [place] Pager: [phone] Office: [phone]   ECHOCARDIOGRAM COMPLETE  Result Date: 11/15/2022    ECHOCARDIOGRAM REPORT   Patient Name:   Ricky [place]. Date of Exam: 11/15/2022 Medical Rec #:  [id number]             Height:       75.0 in Accession #:    [id number]            Weight:       210.0 lb Date of Birth:  01-11-1946             BSA:          2.240 m Patient Age:    77 years              BP:           102/85 mmHg Patient Gender: M                     HR:           71 bpm. Exam Location:  Inpatient Procedure: 2D Echo, Cardiac Doppler, Color Doppler and Strain Analysis Indications:    NSTEMI  History:        Patient has prior history of Echocardiogram examinations, most                 recent 11/06/2017. Cancer; Risk Factors:Hypertension,                 Dyslipidemia and Diabetes.  Sonographer:    Delcie Roch RDCS Referring Phys: [id number] Elmon Kirschner  Sonographer Comments: Global longitudinal strain was attempted. IMPRESSIONS  1. Septal, apical distal anterior, mid/distal inferior wall hypokinesis . Left ventricular ejection fraction, by estimation, is 40 to 45%. The left ventricle has mildly decreased function. The left ventricle demonstrates regional wall motion abnormalities (see scoring diagram/findings for description). The left ventricular internal cavity size was mildly dilated. There is mild left ventricular hypertrophy. Left ventricular diastolic parameters are consistent with Grade I diastolic dysfunction (impaired relaxation).  2. Right ventricular systolic function is normal. The right ventricular size is normal. There is moderately elevated pulmonary artery systolic pressure.  3. Left atrial size was mildly dilated.  4. The mitral valve is normal in structure. Mild mitral valve regurgitation. No evidence of mitral stenosis.  5. The aortic valve is tricuspid. There is mild calcification of the aortic valve. Aortic valve regurgitation is mild. Aortic valve sclerosis is present, with  no evidence of aortic valve stenosis.  6. The inferior vena cava is normal in size with greater than 50% respiratory variability, suggesting right atrial pressure of 3 mmHg. FINDINGS  Left Ventricle: Septal, apical distal anterior, mid/distal inferior wall hypokinesis. Left ventricular ejection fraction, by estimation, is 40 to 45%. The left ventricle has mildly decreased function. The left ventricle demonstrates regional wall motion  abnormalities. The left ventricular internal cavity size was mildly dilated. There is mild left ventricular hypertrophy. Left ventricular diastolic parameters are consistent with Grade I diastolic dysfunction (impaired relaxation). Right Ventricle: The right ventricular size is normal. No increase in right ventricular wall thickness. Right ventricular systolic function is normal. There is moderately elevated pulmonary artery systolic pressure. The tricuspid regurgitant velocity  Ricky [place]:[id number] DOB: 06-05-1945 DOA: 12/01/2022  PCP: [name], [place]  Admit date: 12/01/2022  Discharge date: 12/05/2022  Admitted From: Home   Disposition:  Home   Recommendations for Outpatient Follow-up:   Follow up with PCP in 1-2 weeks  PCP Please obtain BMP/CBC, 2 view CXR in 1week,  (see Discharge instructions)   PCP Please follow up on the following pending results:    Home Health: None   Equipment/Devices: None  Consultations: Cards, GI Discharge Condition: Stable    CODE STATUS: Full    Diet Recommendation: Heart Healthy     Chief Complaint  Patient presents with   Fall     Brief history of present illness from the day of admission and additional interim summary    77 y.o. male with medical history significant of CAD status post recent PCI September 2024 on aspirin and Effient, HLD, HTN, MGUS, CKD 3B comes to the ED for syncopal episode.  Patient states he was letting the dogs out earlier today when he got dizzy and lightheaded.  Came to the ER where he was found to have severe anemia, he also reports history of melena since he was discharged from the hospital on DAPT for his cardiac stent, he has had 3-4 episodes of melena since then.  He was admitted for symptomatic anemia, possible upper GI bleed and further workup.                                                                   Hospital Course   Symptomatic acute upper GI bleed related blood loss anemia.  In a patient with recent coronary stenting and DAPT, discharged on 11/20/2022, has been having melanotic stools intermittently since then.  Has received 1 unit of packed RBC, IV PPI, DAPT per Cards & GI, both following.  EGD done was unremarkable on 12/02/2022 by [facility], DAPT resumed upon GI recommendation on 12/02/2022,  1 your unit of packed RBC on 12/03/2022, transfusion CBC stable continue to monitor, main stable on DAPT and PPI combination for 48 hours, H&H gradually rising, no further melena will be discharged home on DAPT, note pain being continued and Effient has been switched to Plavix by cardiology, post discharge will follow-up with both GI and cardiology.  Bleeding most likely small bowel related.  Defer further management to GI outpatient.  Case was discussed with Dr. Bosie Clos GI in detail on 12/03/2022.   CAD s/p PCI 11/16/2022.  Currently on DAPT again since 12/02/2022, cardiology on board, chest pain-free, outpatient Repatha infusions.   History of bradycardia.  Avoiding beta-blocker and rate controlling agents.  Cardiology on board will continue to follow with cardiology postdischarge, stable TSH, he will also get a 30-day event monitor postdischarge by his cardiologist.   Hypertension.  I-->III, MSA 10.6 mm2        Loss of one septal branch with mild burning sensation. No conduction abnormality noted, in fact rate is improved from 40 bpm to >60 bpm, not requiring any temporary pacing. Recommend overnight IV NTG. Ricky Negus,  [place] Pager: [phone] Office: [phone]   ECHOCARDIOGRAM COMPLETE  Result Date: 11/15/2022    ECHOCARDIOGRAM REPORT   Patient Name:   Ricky [place]. Date of Exam: 11/15/2022 Medical Rec #:  [id number]             Height:       75.0 in Accession #:    [id number]            Weight:       210.0 lb Date of Birth:  01-11-1946             BSA:          2.240 m Patient Age:    77 years              BP:           102/85 mmHg Patient Gender: M                     HR:           71 bpm. Exam Location:  Inpatient Procedure: 2D Echo, Cardiac Doppler, Color Doppler and Strain Analysis Indications:    NSTEMI  History:        Patient has prior history of Echocardiogram examinations, most                 recent 11/06/2017. Cancer; Risk Factors:Hypertension,                 Dyslipidemia and Diabetes.  Sonographer:    Delcie Roch RDCS Referring Phys: [id number] Elmon Kirschner  Sonographer Comments: Global longitudinal strain was attempted. IMPRESSIONS  1. Septal, apical distal anterior, mid/distal inferior wall hypokinesis . Left ventricular ejection fraction, by estimation, is 40 to 45%. The left ventricle has mildly decreased function. The left ventricle demonstrates regional wall motion abnormalities (see scoring diagram/findings for description). The left ventricular internal cavity size was mildly dilated. There is mild left ventricular hypertrophy. Left ventricular diastolic parameters are consistent with Grade I diastolic dysfunction (impaired relaxation).  2. Right ventricular systolic function is normal. The right ventricular size is normal. There is moderately elevated pulmonary artery systolic pressure.  3. Left atrial size was mildly dilated.  4. The mitral valve is normal in structure. Mild mitral valve regurgitation. No evidence of mitral stenosis.  5. The aortic valve is tricuspid. There is mild calcification of the aortic valve. Aortic valve regurgitation is mild. Aortic valve sclerosis is present, with  no evidence of aortic valve stenosis.  6. The inferior vena cava is normal in size with greater than 50% respiratory variability, suggesting right atrial pressure of 3 mmHg. FINDINGS  Left Ventricle: Septal, apical distal anterior, mid/distal inferior wall hypokinesis. Left ventricular ejection fraction, by estimation, is 40 to 45%. The left ventricle has mildly decreased function. The left ventricle demonstrates regional wall motion  abnormalities. The left ventricular internal cavity size was mildly dilated. There is mild left ventricular hypertrophy. Left ventricular diastolic parameters are consistent with Grade I diastolic dysfunction (impaired relaxation). Right Ventricle: The right ventricular size is normal. No increase in right ventricular wall thickness. Right ventricular systolic function is normal. There is moderately elevated pulmonary artery systolic pressure. The tricuspid regurgitant velocity  I-->III, MSA 10.6 mm2        Loss of one septal branch with mild burning sensation. No conduction abnormality noted, in fact rate is improved from 40 bpm to >60 bpm, not requiring any temporary pacing. Recommend overnight IV NTG. Ricky Negus,  [place] Pager: [phone] Office: [phone]   ECHOCARDIOGRAM COMPLETE  Result Date: 11/15/2022    ECHOCARDIOGRAM REPORT   Patient Name:   Ricky [place]. Date of Exam: 11/15/2022 Medical Rec #:  [id number]             Height:       75.0 in Accession #:    [id number]            Weight:       210.0 lb Date of Birth:  01-11-1946             BSA:          2.240 m Patient Age:    77 years              BP:           102/85 mmHg Patient Gender: M                     HR:           71 bpm. Exam Location:  Inpatient Procedure: 2D Echo, Cardiac Doppler, Color Doppler and Strain Analysis Indications:    NSTEMI  History:        Patient has prior history of Echocardiogram examinations, most                 recent 11/06/2017. Cancer; Risk Factors:Hypertension,                 Dyslipidemia and Diabetes.  Sonographer:    Delcie Roch RDCS Referring Phys: [id number] Elmon Kirschner  Sonographer Comments: Global longitudinal strain was attempted. IMPRESSIONS  1. Septal, apical distal anterior, mid/distal inferior wall hypokinesis . Left ventricular ejection fraction, by estimation, is 40 to 45%. The left ventricle has mildly decreased function. The left ventricle demonstrates regional wall motion abnormalities (see scoring diagram/findings for description). The left ventricular internal cavity size was mildly dilated. There is mild left ventricular hypertrophy. Left ventricular diastolic parameters are consistent with Grade I diastolic dysfunction (impaired relaxation).  2. Right ventricular systolic function is normal. The right ventricular size is normal. There is moderately elevated pulmonary artery systolic pressure.  3. Left atrial size was mildly dilated.  4. The mitral valve is normal in structure. Mild mitral valve regurgitation. No evidence of mitral stenosis.  5. The aortic valve is tricuspid. There is mild calcification of the aortic valve. Aortic valve regurgitation is mild. Aortic valve sclerosis is present, with  no evidence of aortic valve stenosis.  6. The inferior vena cava is normal in size with greater than 50% respiratory variability, suggesting right atrial pressure of 3 mmHg. FINDINGS  Left Ventricle: Septal, apical distal anterior, mid/distal inferior wall hypokinesis. Left ventricular ejection fraction, by estimation, is 40 to 45%. The left ventricle has mildly decreased function. The left ventricle demonstrates regional wall motion  abnormalities. The left ventricular internal cavity size was mildly dilated. There is mild left ventricular hypertrophy. Left ventricular diastolic parameters are consistent with Grade I diastolic dysfunction (impaired relaxation). Right Ventricle: The right ventricular size is normal. No increase in right ventricular wall thickness. Right ventricular systolic function is normal. There is moderately elevated pulmonary artery systolic pressure. The tricuspid regurgitant velocity

## 2022-12-05 NOTE — Progress Notes (Signed)
Rounding Note    Patient Name: Ricky Rowland. Date of Encounter: 12/05/2022  [facility] Cardiologist: Charlton Haws, MD   Subjective   BP 117/70. Cr stable (2.23 >1.90>1.78>1.57>1.65).  Hgb 9.9. Denies any chest pain or dyspnea  Inpatient Medications    Scheduled Meds:  allopurinol  300 mg Oral Daily   amLODipine  10 mg Oral Daily   aspirin EC  81 mg Oral Daily   clopidogrel  75 mg Oral Daily   docusate sodium  200 mg Oral BID   lidocaine  1 patch Transdermal Q24H   pantoprazole (PROTONIX) IV  40 mg Intravenous Q12H   Continuous Infusions:   PRN Meds: acetaminophen, hydrALAZINE, ipratropium-albuterol, nitroGLYCERIN, ondansetron (ZOFRAN) IV, polyethylene glycol, senna-docusate, traZODone   Vital Signs    Vitals:   12/04/22 1600 12/04/22 2000 12/05/22 0049 12/05/22 0400  BP: 139/71 136/70 (!) 140/77 117/70  Pulse:  64 72 (!) 58  Resp: 18 18 18 18   Temp: 98.1 F (36.7 C) 98.9 F (37.2 C) 98.7 F (37.1 C) 98.6 F (37 C)  TempSrc: Oral Oral Oral Oral  SpO2:   97% 98%   No intake or output data in the 24 hours ending 12/05/22 0822     11/26/2022   11:57 AM 11/19/2022    6:02 AM 11/18/2022   12:22 PM  Last 3 Weights  Weight (lbs) 207 lb 3.2 oz 212 lb 11.9 oz 215 lb 13.3 oz  Weight (kg) 93.985 kg 96.5 kg 97.9 kg      Telemetry    NSR with PACs - Personally Reviewed  ECG    NO new ECG - Personally Reviewed  Physical Exam   GEN: No acute distress.   Neck: No JVD Cardiac: RRR, no murmurs, rubs, or gallops.  Respiratory: Clear to auscultation bilaterally. GI: Soft, nontender, non-distended  MS: No edema; No deformity. Neuro:  Nonfocal  Psych: Normal affect   Labs    High Sensitivity Troponin:   Recent Labs  Lab 11/14/22 1821 11/14/22 2035 11/15/22 0039 12/01/22 1132 12/01/22 1344  TROPONINIHS 144* 2,132* 5,707* 27* 29*     Chemistry Recent Labs  Lab 12/02/22 0438 12/03/22 0357 12/04/22 0612 12/05/22 0638  NA 135 136  136 135  K 4.0 3.8 3.9 4.0  CL 106 105 104 102  CO2 19* 19* 21* 22  GLUCOSE 137* 140* 136* 139*  BUN 36* 32* 25* 26*  CREATININE 1.90* 1.78* 1.57* 1.65*  CALCIUM 9.3 9.3 9.3 9.7  MG 2.0 1.9 1.9  --   GFRNONAA 35* 38* 45* 42*  ANIONGAP 10 12 11 11     Lipids No results for input(s): "CHOL", "TRIG", "HDL", "LABVLDL", "LDLCALC", "CHOLHDL" in the last 168 hours.  Hematology Recent Labs  Lab 12/03/22 1353 12/04/22 0612 12/05/22 0638  WBC 7.3 6.9 7.1  RBC 2.68* 2.80* 3.08*  HGB 8.9* 8.7* 9.9*  HCT 25.9* 26.6* 29.9*  MCV 96.6 95.0 97.1  MCH 33.2 31.1 32.1  MCHC 34.4 32.7 33.1  RDW 14.6 14.3 14.0  PLT 201 208 214   Thyroid  Recent Labs  Lab 12/02/22 0435  TSH 2.870    BNP Recent Labs  Lab 12/03/22 0357 12/04/22 0612  BNP 281.2* 303.5*    DDimer  Recent Labs  Lab 12/01/22 1147  DDIMER 1.82*     Radiology    No results found.  Cardiac Studies     Patient Profile     77 y.o. male with a hx of monoclonal gammopathy  of undetermined significance (under current observation), prostate cancer s/p radiotherapy on Xtandi, unexplained syncope, loop recorder explant 01/27/2022, spinal stenosis, CAD s/p LAD stent 11/16/22, carotid stenosis, CVA, who is being seen 12/01/2022 for the evaluation of antithrombotic therapy in the setting of GI bleed at the request of Dr. Nelson Chimes.   Assessment & Plan    CAD: s/p prox LAD DES placed 11/16/22.  Presented with GI bleed. -Continue aspirin 81 mg daily -Effient was held on admission due to GI bleed.  EGD 9/24 does not show source of bleeding.  GI OK with restarting DAPT on 9/24.  Patient had been started on Effient instead of Brilinta after PCI on 9/8 due to interaction with Xtandi; Plavix was not chosen due to high CAD burden.  However in setting of GI bleed and increased risk of bleeding on Effient, would recommend switching to Plavix.  Discussed with pharmacy and interventional cardiology. Loaded with 300 mg Plavix 9/24 and then resuming 75  mg Plavix daily 9/25.  Since restarting plavix Hgb has been stable and no further bleeding seen  Chronic systolic heart failure: EF 40 to 45% on echo 11/15/2022.  Presented with AKI, losartan on hold.  Not on beta-blocker due to bradycardia -Renal function improving, will hold off on losartan for now, can restart as outpatient if stable renal function  Acute blood loss anemia: Due to GI bleed.  EGD did not show source of bleeding.  Suspected small bowel source per GI.  Okay with restarting DAPT 9/24, monitor for further bleeding.  Goal Hgb >8 given CAD  Syncope: p/w syncope, suspect due to anemia as above.  However did have significant bradycardia last admission requiring TVP at time of PCI; has had stable sinus bradycardia on monitor this admission.  Will discharge with Zio x 2 weeks.  [facility] will sign off.   Medication Recommendations:  ASA 81 mg daily, plavix 75 mg daily, Repatha Other recommendations (labs, testing, etc):  Zio x 2 weeks Follow up as an outpatient:  Will schedule    For questions or updates, please contact [facility] Please consult [email or web address] for contact info under        Signed, Little Ishikawa, MD  12/05/2022, 8:22 AM

## 2022-12-05 NOTE — Discharge Instructions (Addendum)
Follow with Primary MD [name], MD in 7 days, get your scalp sutures removed by PCP in the next 5 to 7 days.  Get CBC, CMP, 2 view Chest X ray -  checked next visit with your primary MD   Activity: As tolerated with Full fall precautions use walker/cane & assistance as needed  Disposition Home    Diet: Heart Healthy    Special Instructions: If you have smoked or chewed Tobacco  in the last 2 yrs please stop smoking, stop any regular Alcohol  and or any Recreational drug use.  On your next visit with your primary care physician please Get Medicines reviewed and adjusted.  Please request your Prim.MD to go over all Hospital Tests and Procedure/Radiological results at the follow up, please get all Hospital records sent to your Prim MD by signing hospital release before you go home.  If you experience worsening of your admission symptoms, develop shortness of breath, life threatening emergency, suicidal or homicidal thoughts you must seek medical attention immediately by calling 911 or calling your MD immediately  if symptoms less severe.  You Must read complete instructions/literature along with all the possible adverse reactions/side effects for all the Medicines you take and that have been prescribed to you. Take any new Medicines after you have completely understood and accpet all the possible adverse reactions/side effects.   Do not drive when taking Pain medications.  Do not take more than prescribed Pain, Sleep and Anxiety Medications

## 2022-12-05 NOTE — Progress Notes (Signed)
Discharge instructions given with stated understanding.  Patient waiting for transportation home at this time 

## 2022-12-05 NOTE — Progress Notes (Signed)
  Ordered 2 week live Zio monitor to be placed prior to discharge. Please see Dr. Campbell Lerner rounding note from today for more information.  Corrin Parker, PA-C 12/05/2022 1:42 PM

## 2022-12-09 NOTE — Progress Notes (Signed)
Patient had ZIO AT serial # [id number] applied at the hospital when discharged 12/05/22.   Monitor fell off Saturday and was taped back on.  Monitor fell off again Tuesday. Patient came to [facility] st office.  We applied ZIO AT serial # [id number] from our office inventory and mailed back previous monitor for processing.  Murriel Hopper from [place] notified.

## 2022-12-11 ENCOUNTER — Telehealth: Payer: Self-pay | Admitting: Cardiovascular Disease

## 2022-12-11 NOTE — Telephone Encounter (Signed)
Patient's wife called and said that patient's stool used to be black but since they changed his blood thinner, his stool has gotten browner but their is still a little blood in stool. Want to know if that is normal or not.

## 2022-12-11 NOTE — Telephone Encounter (Signed)
Called patient back about message. Patient stated his stool has gone from black to brown with small amount of blood, but not bright blood. Patient had a fall in the shower this morning and hit is back, but not his head. Encouraged patient to call his PCP and GI doctor with his symptoms. Encouraged patient to go to ED if he ever falls and hits his head since he is on ASA and plavix. Will forward to Dr. Eden Emms for advisement.

## 2022-12-12 NOTE — Telephone Encounter (Signed)
Called patient to let him know Dr. Eden Emms advisement on seeing PCP or GI. Patient verbalized understanding.

## 2022-12-16 ENCOUNTER — Other Ambulatory Visit: Payer: Self-pay

## 2022-12-16 ENCOUNTER — Encounter (HOSPITAL_COMMUNITY): Payer: Self-pay | Admitting: Emergency Medicine

## 2022-12-16 ENCOUNTER — Emergency Department (HOSPITAL_COMMUNITY): Payer: Medicare Other

## 2022-12-16 ENCOUNTER — Inpatient Hospital Stay (HOSPITAL_COMMUNITY)
Admission: EM | Admit: 2022-12-16 | Discharge: 2022-12-22 | DRG: 378 | Disposition: A | Discharge status: Home / Self Care | Payer: Medicare Other | Attending: Internal Medicine | Admitting: Internal Medicine

## 2022-12-16 DIAGNOSIS — Z955 Presence of coronary angioplasty implant and graft: Secondary | ICD-10-CM

## 2022-12-16 DIAGNOSIS — Z7902 Long term (current) use of antithrombotics/antiplatelets: Secondary | ICD-10-CM

## 2022-12-16 DIAGNOSIS — I2510 Atherosclerotic heart disease of native coronary artery without angina pectoris: Secondary | ICD-10-CM | POA: Diagnosis present

## 2022-12-16 DIAGNOSIS — D509 Iron deficiency anemia, unspecified: Secondary | ICD-10-CM | POA: Diagnosis not present

## 2022-12-16 DIAGNOSIS — N1832 Chronic kidney disease, stage 3b: Secondary | ICD-10-CM | POA: Diagnosis present

## 2022-12-16 DIAGNOSIS — I5022 Chronic systolic (congestive) heart failure: Secondary | ICD-10-CM | POA: Diagnosis present

## 2022-12-16 DIAGNOSIS — K222 Esophageal obstruction: Secondary | ICD-10-CM | POA: Diagnosis present

## 2022-12-16 DIAGNOSIS — K219 Gastro-esophageal reflux disease without esophagitis: Secondary | ICD-10-CM | POA: Diagnosis present

## 2022-12-16 DIAGNOSIS — E785 Hyperlipidemia, unspecified: Secondary | ICD-10-CM | POA: Diagnosis present

## 2022-12-16 DIAGNOSIS — Z8249 Family history of ischemic heart disease and other diseases of the circulatory system: Secondary | ICD-10-CM | POA: Diagnosis not present

## 2022-12-16 DIAGNOSIS — K31811 Angiodysplasia of stomach and duodenum with bleeding: Secondary | ICD-10-CM | POA: Diagnosis present

## 2022-12-16 DIAGNOSIS — Z87891 Personal history of nicotine dependence: Secondary | ICD-10-CM | POA: Diagnosis not present

## 2022-12-16 DIAGNOSIS — K449 Diaphragmatic hernia without obstruction or gangrene: Secondary | ICD-10-CM | POA: Diagnosis present

## 2022-12-16 DIAGNOSIS — N1831 Chronic kidney disease, stage 3a: Secondary | ICD-10-CM | POA: Insufficient documentation

## 2022-12-16 DIAGNOSIS — I255 Ischemic cardiomyopathy: Secondary | ICD-10-CM | POA: Diagnosis present

## 2022-12-16 DIAGNOSIS — C7951 Secondary malignant neoplasm of bone: Secondary | ICD-10-CM | POA: Diagnosis present

## 2022-12-16 DIAGNOSIS — H9192 Unspecified hearing loss, left ear: Secondary | ICD-10-CM | POA: Diagnosis present

## 2022-12-16 DIAGNOSIS — K5521 Angiodysplasia of colon with hemorrhage: Secondary | ICD-10-CM | POA: Diagnosis present

## 2022-12-16 DIAGNOSIS — I252 Old myocardial infarction: Secondary | ICD-10-CM

## 2022-12-16 DIAGNOSIS — D649 Anemia, unspecified: Secondary | ICD-10-CM | POA: Diagnosis present

## 2022-12-16 DIAGNOSIS — D472 Monoclonal gammopathy: Secondary | ICD-10-CM | POA: Diagnosis present

## 2022-12-16 DIAGNOSIS — Z79899 Other long term (current) drug therapy: Secondary | ICD-10-CM

## 2022-12-16 DIAGNOSIS — Z888 Allergy status to other drugs, medicaments and biological substances status: Secondary | ICD-10-CM

## 2022-12-16 DIAGNOSIS — I951 Orthostatic hypotension: Secondary | ICD-10-CM | POA: Diagnosis not present

## 2022-12-16 DIAGNOSIS — Z8546 Personal history of malignant neoplasm of prostate: Secondary | ICD-10-CM | POA: Diagnosis not present

## 2022-12-16 DIAGNOSIS — Z8042 Family history of malignant neoplasm of prostate: Secondary | ICD-10-CM

## 2022-12-16 DIAGNOSIS — I130 Hypertensive heart and chronic kidney disease with heart failure and stage 1 through stage 4 chronic kidney disease, or unspecified chronic kidney disease: Secondary | ICD-10-CM | POA: Diagnosis present

## 2022-12-16 DIAGNOSIS — Z923 Personal history of irradiation: Secondary | ICD-10-CM | POA: Diagnosis not present

## 2022-12-16 DIAGNOSIS — K5900 Constipation, unspecified: Secondary | ICD-10-CM | POA: Diagnosis present

## 2022-12-16 DIAGNOSIS — K2941 Chronic atrophic gastritis with bleeding: Secondary | ICD-10-CM | POA: Diagnosis present

## 2022-12-16 DIAGNOSIS — Z8673 Personal history of transient ischemic attack (TIA), and cerebral infarction without residual deficits: Secondary | ICD-10-CM

## 2022-12-16 DIAGNOSIS — I10 Essential (primary) hypertension: Secondary | ICD-10-CM | POA: Diagnosis not present

## 2022-12-16 DIAGNOSIS — K5520 Angiodysplasia of colon without hemorrhage: Secondary | ICD-10-CM

## 2022-12-16 DIAGNOSIS — K922 Gastrointestinal hemorrhage, unspecified: Principal | ICD-10-CM | POA: Diagnosis present

## 2022-12-16 DIAGNOSIS — Z7982 Long term (current) use of aspirin: Secondary | ICD-10-CM

## 2022-12-16 DIAGNOSIS — C61 Malignant neoplasm of prostate: Secondary | ICD-10-CM | POA: Diagnosis present

## 2022-12-16 LAB — CBC
HCT: 20.6 % — ABNORMAL LOW (ref 39.0–52.0)
Hemoglobin: 6.7 g/dL — CL (ref 13.0–17.0)
MCH: 31.8 pg (ref 26.0–34.0)
MCHC: 32.5 g/dL (ref 30.0–36.0)
MCV: 97.6 fL (ref 80.0–100.0)
Platelets: 239 10*3/uL (ref 150–400)
RBC: 2.11 MIL/uL — ABNORMAL LOW (ref 4.22–5.81)
RDW: 13.8 % (ref 11.5–15.5)
WBC: 6.3 10*3/uL (ref 4.0–10.5)
nRBC: 0 % (ref 0.0–0.2)

## 2022-12-16 LAB — COMPREHENSIVE METABOLIC PANEL
ALT: 14 U/L (ref 0–44)
AST: 18 U/L (ref 15–41)
Albumin: 3.3 g/dL — ABNORMAL LOW (ref 3.5–5.0)
Alkaline Phosphatase: 70 U/L (ref 38–126)
Anion gap: 12 (ref 5–15)
BUN: 41 mg/dL — ABNORMAL HIGH (ref 8–23)
CO2: 20 mmol/L — ABNORMAL LOW (ref 22–32)
Calcium: 9.5 mg/dL (ref 8.9–10.3)
Chloride: 103 mmol/L (ref 98–111)
Creatinine, Ser: 1.74 mg/dL — ABNORMAL HIGH (ref 0.61–1.24)
GFR, Estimated: 39 mL/min — ABNORMAL LOW (ref >60)
Glucose, Bld: 107 mg/dL — ABNORMAL HIGH (ref 70–99)
Potassium: 4 mmol/L (ref 3.5–5.1)
Sodium: 135 mmol/L (ref 135–145)
Total Bilirubin: 0.4 mg/dL (ref 0.3–1.2)
Total Protein: 6.8 g/dL (ref 6.5–8.1)

## 2022-12-16 LAB — POC OCCULT BLOOD, ED: Fecal Occult Bld: POSITIVE — AB

## 2022-12-16 LAB — PREPARE RBC (CROSSMATCH)

## 2022-12-16 LAB — TROPONIN I (HIGH SENSITIVITY): Troponin I (High Sensitivity): 18 ng/L — ABNORMAL HIGH (ref <18)

## 2022-12-16 MED ORDER — IOHEXOL 350 MG/ML SOLN
60.0000 mL | Freq: Once | INTRAVENOUS | Status: AC | PRN
  Administered 2022-12-16: 60 mL via INTRAVENOUS

## 2022-12-16 MED ORDER — NITROGLYCERIN 0.4 MG SL SUBL
0.4000 mg | SUBLINGUAL_TABLET | SUBLINGUAL | Status: DC | PRN
  Administered 2022-12-16: 0.4 mg via SUBLINGUAL

## 2022-12-16 MED ORDER — NITROGLYCERIN 0.4 MG SL SUBL
SUBLINGUAL_TABLET | SUBLINGUAL | Status: AC
  Filled 2022-12-16: qty 3

## 2022-12-16 MED ORDER — SODIUM CHLORIDE 0.9% IV SOLUTION: Freq: Once | INTRAVENOUS | Status: AC

## 2022-12-16 NOTE — H&P (Signed)
History and Physical    Patient: Ricky Rowland UEA:[id number] DOB: 1946/03/04 DOA: 12/16/2022 DOS: the patient was seen and examined on 12/16/2022 PCP: [name], MD  Patient coming from: Home  Chief Complaint:  Chief Complaint  Patient presents with   Rectal Bleeding   HPI: Ricky Rowland. is a 77 y.o. male with medical history significant of recent GI bleed with workup performed on 924 with EGD who is on dual antiplatelet therapy at home, history of coronary artery disease status post recent PCI again in September of this year.  Patient has been on aspirin and Effient afterwards, hypertension, hyperlipidemia, MGUS, chronic kidney disease stage IIIb and hyperlipidemia who presented to the ER today with recurrent GI bleed.  Patient was evaluated previously for the GI bleed after he was on dual antiplatelet therapy as a result of his coronary artery disease.  The EGD that he had done did not show any source of bleeding.  His H&H was stable at that time and was placed back on his dual antiplatelet therapy.  Patient however is back here again with another episode of melena with subsequent drop in his hemoglobin.  At time of discharge hemoglobin was around 9.9 g.  It is now dropped to 6.7.  Patient reported seeing the melena again.  At this time therefore is being admitted for management of GI bleed.  Denied any nausea or vomiting.  Denied any bright red blood per rectum but mainly the melena.  Patient however is having some weakness and lightheadedness.  He is being admitted for further evaluation and treatment.  Review of Systems: As mentioned in the history of present illness. All other systems reviewed and are negative. Past Medical History:  Diagnosis Date   Borderline diabetes    DIET CONTROLLED   Dyslipidemia    Elevated PSA    External carotid artery stenosis    MILD-- BILATERAL   Fall 11/05/2017   GERD (gastroesophageal reflux disease)    Gout, arthritis    Hearing  loss in left ear    Heart murmur    MILD --  ASYMPTOMATIC   History of gastric ulcer    REMOTE HX   Hypertension    Pre-diabetes    Prostate cancer (HCC) 12/21/2012   Gleason 3+4=7   Seizure-like activity (HCC)    Syncope and collapse    Wears glasses    Past Surgical History:  Procedure Laterality Date   CORONARY STENT INTERVENTION N/A 11/16/2022   Procedure: CORONARY STENT INTERVENTION;  Surgeon: Elder Negus, MD;  Location: [facility] INVASIVE CV LAB;  Service: Cardiovascular;  Laterality: N/A;   CORONARY ULTRASOUND/IVUS N/A 11/16/2022   Procedure: Coronary Ultrasound/IVUS;  Surgeon: Elder Negus, MD;  Location: [facility] INVASIVE CV LAB;  Service: Cardiovascular;  Laterality: N/A;   ESOPHAGOGASTRODUODENOSCOPY (EGD) WITH PROPOFOL N/A 12/02/2022   Procedure: ESOPHAGOGASTRODUODENOSCOPY (EGD) WITH PROPOFOL;  Surgeon: Charlott Rakes, MD;  Location: [facility] ENDOSCOPY;  Service: Gastroenterology;  Laterality: N/A;   KNEE ARTHROSCOPY W/ MENISCAL REPAIR Left 03/10/2012   LEFT HEART CATH AND CORONARY ANGIOGRAPHY N/A 11/16/2022   Procedure: LEFT HEART CATH AND CORONARY ANGIOGRAPHY;  Surgeon: Elder Negus, MD;  Location: [facility] INVASIVE CV LAB;  Service: Cardiovascular;  Laterality: N/A;   LOOP RECORDER INSERTION N/A 01/05/2018   Procedure: LOOP RECORDER INSERTION;  Surgeon: Duke Salvia, MD;  Location: [facility] INVASIVE CV LAB;  Service: Cardiovascular;  Laterality: N/A;   LUMBAR SPINE SURGERY  03/10/1989   PROSTATE BIOPSY N/A 12/21/2012  Procedure: BIOPSY TRANSRECTAL ULTRASONIC PROSTATE (TUBP);  Surgeon: Lindaann Slough, MD;  Location: [facility];  Service: Urology;  Laterality: N/A;   PROSTATE BIOPSY  11/26/2011   benign   TEMPORARY PACEMAKER N/A 11/16/2022   Procedure: TEMPORARY PACEMAKER;  Surgeon: Elder Negus, MD;  Location: [facility] INVASIVE CV LAB;  Service: Cardiovascular;  Laterality: N/A;   TONSILLECTOMY     WISDOM TOOTH EXTRACTION     Social History:  reports that  he quit smoking about 25 years ago. His smoking use included cigarettes. He started smoking about 55 years ago. He has a 30 pack-year smoking history. He has never used smokeless tobacco. He reports that he does not currently use alcohol after a past usage of about 20.0 standard drinks of alcohol per week. He reports that he does not use drugs.  Allergies  Allergen Reactions   Ezetimibe Other (See Comments)    Muscle aches   Statins Other (See Comments)    Joint pain    Family History  Problem Relation Age of Onset   Heart disease Mother    Heart disease Father    Cancer Sister        lung   Cancer Brother        prostate    Prior to Admission medications   Medication Sig Start Date End Date Taking? Authorizing Provider  acetaminophen (TYLENOL) 650 MG CR tablet Take 650 mg by mouth every 8 (eight) hours as needed for pain.    [provider]  allopurinol (ZYLOPRIM) 300 MG tablet Take 300 mg by mouth daily.    [provider]  amLODipine (NORVASC) 10 MG tablet Take 1 tablet (10 mg total) by mouth daily. 12/05/22   Leroy Sea, MD  aspirin EC 81 MG tablet Take 1 tablet (81 mg total) by mouth daily. Swallow whole. 11/19/22   Dunn, Tacey Ruiz, PA-C  clopidogrel (PLAVIX) 75 MG tablet Take 1 tablet (75 mg total) by mouth daily. 12/05/22   Leroy Sea, MD  docusate sodium (COLACE) 100 MG capsule Take 2 capsules (200 mg total) by mouth daily as needed for mild constipation. 12/05/22   Leroy Sea, MD  Multiple Vitamins-Minerals (MULTIVITAMIN ADULT EXTRA C PO) Take 1 tablet by mouth daily. 07/24/22   [provider]  Naphazoline HCl (CLEAR EYES OP) Place 1 drop into both eyes daily as needed (dry eyes).     [provider]  nitroGLYCERIN (NITROSTAT) 0.4 MG SL tablet Place 1 tablet (0.4 mg total) under the tongue every 5 (five) minutes as needed for chest pain (up to 3 doses). 11/19/22   Dunn, Dayna N, PA-C  ORGOVYX 120 MG tablet Take 120 mg by mouth  daily. 05/19/22   [provider]  pantoprazole (PROTONIX) 40 MG tablet Take 1 tablet (40 mg total) by mouth 2 (two) times daily before a meal. 12/05/22   Leroy Sea, MD  polyethylene glycol powder (GLYCOLAX/MIRALAX) 17 GM/SCOOP powder Take 1 capful (17 g) with water by mouth daily as needed for mild constipation. 12/05/22   Leroy Sea, MD  REPATHA SURECLICK 140 MG/ML SOAJ Inject 140 mg into the skin every 14 (fourteen) days.  06/07/17   [provider]  tolnaftate (TINACTIN) 1 % cream Apply 1 application topically daily as needed (jock itch).    [provider]  XTANDI 40 MG capsule Take 160 mg by mouth daily.    [provider]    Physical Exam: Vitals:  12/16/22 1806 12/16/22 1809  BP: (!) 145/69   Pulse: 73   Resp: 17   Temp: 97.6 F (36.4 C)   SpO2: 100%   Weight:  93.9 kg  Height:  6\' 3"  (1.905 m)   Constitutional: NAD, calm, comfortable Eyes: PERRL, lids and conjunctivae pale ENMT: Mucous membranes are moist. Posterior pharynx clear of any exudate or lesions.Normal dentition.  Neck: normal, supple, no masses, no thyromegaly Respiratory: clear to auscultation bilaterally, no wheezing, no crackles. Normal respiratory effort. No accessory muscle use.  Cardiovascular: Regular rate and rhythm, no murmurs / rubs / gallops. No extremity edema. 2+ pedal pulses. No carotid bruits.  Abdomen: no tenderness, no masses palpated. No hepatosplenomegaly. Bowel sounds positive.  Musculoskeletal: Good range of motion, no joint swelling or tenderness, Skin: no rashes, lesions, ulcers. No induration Neurologic: CN 2-12 grossly intact. Sensation intact, DTR normal. Strength 5/5 in all 4.  Psychiatric: Normal judgment and insight. Alert and oriented x 3. Normal mood  Data Reviewed:  Co2 20, BUN 41, Creatinine 1.74, Hb 6.7, Fecal Occult blood positive x 2  Assessment and Plan:  #1 recurrent lower GI bleed: Patient was just discharged after having  melena.  Suspected to be due to his dual antiplatelet therapy.  Hemoglobin has dropped precipitously.  We will admit the patient.  Serial H&H, IV Protonix, GI consulted and transfuse 1 unit of packed red blood cells.  We will hold his aspirin and Brilinta temporarily.  CTA bleed ordered.  #2 coronary artery disease: Patient will be placed on telemetry monitoring.  Again will hold aspirin and statin.  Continue with other medications.  Patient has had chest pain now while in CT.  Troponin will be checked.  EKG showed sinus rhythm with some ST depression laterally which appears to unchanged from previous.  #3 essential hypertension: Hold medications in the setting of active GI bleed.  Will resume when stable.  #4 symptomatic anemia: Including patient's chest pain.  More than likely demand ischemia.  Transfuse patient as above  #5 hyperlipidemia: Continue with statin.  #6 history of prostate cancer: Follow-up with oncology.  #7 history of MGUS: Outpatient follow-up    Advance Care Planning:   Code Status: Full Code   Consults: [facility]  Family Communication: Wife at bedside  Severity of Illness: The appropriate patient status for this patient is INPATIENT. Inpatient status is judged to be reasonable and necessary in order to provide the required intensity of service to ensure the patient's safety. The patient's presenting symptoms, physical exam findings, and initial radiographic and laboratory data in the context of their chronic comorbidities is felt to place them at high risk for further clinical deterioration. Furthermore, it is not anticipated that the patient will be medically stable for discharge from the hospital within 2 midnights of admission.   * I certify that at the point of admission it is my clinical judgment that the patient will require inpatient hospital care spanning beyond 2 midnights from the point of admission due to high intensity of service, high risk for further  deterioration and high frequency of surveillance required.*  AuthorLonia Blood, MD 12/16/2022 9:26 PM  For on call review [email or web address].

## 2022-12-16 NOTE — ED Triage Notes (Signed)
Per [facility] pt coming from home c/o dark stools since sept 23rd. Patient had labs drawn today with hemoglobin 7.

## 2022-12-16 NOTE — ED Notes (Signed)
Report receive from Latanya Maudlin. RN. Assumed care of pt at this time.

## 2022-12-16 NOTE — ED Notes (Signed)
Patient returned from CT with new onset of shortness of breath and chest pain after standing in CT. EDP and admitting MD notified. EKG completed and troponin sent off. Waiting on further orders.

## 2022-12-16 NOTE — ED Notes (Signed)
EDP notified of critical lab values.  Hemoglobin 6.7

## 2022-12-16 NOTE — ED Provider Notes (Signed)
[place] EMERGENCY DEPARTMENT AT [facility] Provider Note   CSN: [id number] Arrival date & time: 12/16/22  1802     History  Chief Complaint  Patient presents with   Rectal Bleeding    Ricky Penna. is a 77 y.o. male.  HPI   77 year old male with past medical history of CAD status post STEMI on dual antiplatelet therapy presents emergency department with concern for dark stools.  Patient has had an episode of this prior, admitted a couple weeks ago.  He required transfusion.  His dual therapy was held, endoscopy was unrevealing and he was restarted on the medicine.  However he comes in with continued dark stools.  He is also complaining of constipation.  Denies any acute abdominal pain or distention.  No fever.  He admits to fatigue, lightheadedness, exertional shortness of breath.  Home Medications Prior to Admission medications   Medication Sig Start Date End Date Taking? Authorizing Provider  acetaminophen (TYLENOL) 650 MG CR tablet Take 650 mg by mouth every 8 (eight) hours as needed for pain.    [provider]  allopurinol (ZYLOPRIM) 300 MG tablet Take 300 mg by mouth daily.    [provider]  amLODipine (NORVASC) 10 MG tablet Take 1 tablet (10 mg total) by mouth daily. 12/05/22   Leroy Sea, MD  aspirin EC 81 MG tablet Take 1 tablet (81 mg total) by mouth daily. Swallow whole. 11/19/22   Dunn, Tacey Ruiz, PA-C  clopidogrel (PLAVIX) 75 MG tablet Take 1 tablet (75 mg total) by mouth daily. 12/05/22   Leroy Sea, MD  docusate sodium (COLACE) 100 MG capsule Take 2 capsules (200 mg total) by mouth daily as needed for mild constipation. 12/05/22   Leroy Sea, MD  Multiple Vitamins-Minerals (MULTIVITAMIN ADULT EXTRA C PO) Take 1 tablet by mouth daily. 07/24/22   [provider]  Naphazoline HCl (CLEAR EYES OP) Place 1 drop into both eyes daily as needed (dry eyes).     [provider]  nitroGLYCERIN (NITROSTAT) 0.4  MG SL tablet Place 1 tablet (0.4 mg total) under the tongue every 5 (five) minutes as needed for chest pain (up to 3 doses). 11/19/22   Dunn, Dayna N, PA-C  ORGOVYX 120 MG tablet Take 120 mg by mouth daily. 05/19/22   [provider]  pantoprazole (PROTONIX) 40 MG tablet Take 1 tablet (40 mg total) by mouth 2 (two) times daily before a meal. 12/05/22   Leroy Sea, MD  polyethylene glycol powder (GLYCOLAX/MIRALAX) 17 GM/SCOOP powder Take 1 capful (17 g) with water by mouth daily as needed for mild constipation. 12/05/22   Leroy Sea, MD  REPATHA SURECLICK 140 MG/ML SOAJ Inject 140 mg into the skin every 14 (fourteen) days.  06/07/17   [provider]  tolnaftate (TINACTIN) 1 % cream Apply 1 application topically daily as needed (jock itch).    [provider]  XTANDI 40 MG capsule Take 160 mg by mouth daily.    [provider]      Allergies    Ezetimibe and Statins    Review of Systems   Review of Systems  Constitutional:  Positive for fatigue. Negative for fever.  Respiratory:  Positive for shortness of breath.   Cardiovascular:  Negative for chest pain.  Gastrointestinal:  Positive for blood in stool and constipation. Negative for abdominal pain, diarrhea and vomiting.  Skin:  Negative for rash.  Neurological:  Negative for headaches.  Physical Exam Updated Vital Signs BP (!) 145/69   Pulse 73   Temp 97.6 F (36.4 C)   Resp 17   Ht 6\' 3"  (1.905 m)   Wt 93.9 kg   SpO2 100% Comment: Simultaneous filing. User may not have seen previous data.  BMI 25.87 kg/m  Physical Exam Vitals and nursing note reviewed.  Constitutional:      General: He is not in acute distress.    Appearance: Normal appearance.  HENT:     Head: Normocephalic.     Mouth/Throat:     Mouth: Mucous membranes are moist.  Cardiovascular:     Rate and Rhythm: Normal rate.  Pulmonary:     Effort: Pulmonary effort is normal. No respiratory distress.  Abdominal:      General: Bowel sounds are normal.     Palpations: Abdomen is soft.     Tenderness: There is no abdominal tenderness. There is no guarding.  Skin:    General: Skin is warm.  Neurological:     Mental Status: He is alert and oriented to person, place, and time. Mental status is at baseline.  Psychiatric:        Mood and Affect: Mood normal.     ED Results / Procedures / Treatments   Labs (all labs ordered are listed, but only abnormal results are displayed) Labs Reviewed  COMPREHENSIVE METABOLIC PANEL - Abnormal; Notable for the following components:      Result Value   CO2 20 (*)    Glucose, Bld 107 (*)    BUN 41 (*)    Creatinine, Ser 1.74 (*)    Albumin 3.3 (*)    GFR, Estimated 39 (*)    All other components within normal limits  CBC - Abnormal; Notable for the following components:   RBC 2.11 (*)    Hemoglobin 6.7 (*)    HCT 20.6 (*)    All other components within normal limits  POC OCCULT BLOOD, ED  TYPE AND SCREEN  PREPARE RBC (CROSSMATCH)    EKG None  Radiology No results found.  Procedures .Critical Care  Performed by: Rozelle Logan, DO Authorized by: Rozelle Logan, DO   Critical care provider statement:    Critical care time (minutes):  50   Critical care time was exclusive of:  Separately billable procedures and treating other patients   Critical care was necessary to treat or prevent imminent or life-threatening deterioration of the following conditions:  Metabolic crisis   Critical care was time spent personally by me on the following activities:  Development of treatment plan with patient or surrogate, discussions with consultants, evaluation of patient's response to treatment, examination of patient, ordering and review of laboratory studies, ordering and review of radiographic studies, ordering and performing treatments and interventions, pulse oximetry, re-evaluation of patient's condition and review of old charts   I assumed direction of  critical care for this patient from another provider in my specialty: no     Care discussed with: admitting provider       Medications Ordered in ED Medications  0.9 %  sodium chloride infusion (Manually program via Guardrails IV Fluids) (has no administration in time range)    ED Course/ Medical Decision Making/ A&P                                 Medical Decision Making Amount and/or Complexity of Data Reviewed Labs: ordered.  Radiology: ordered.  Risk Prescription drug management. Decision regarding hospitalization.   77 year old male presents emergency department with ongoing dark stools.  Currently on dual antiplatelet therapy secondary to cardiovascular disease.  Also endorsing symptoms of anemia, lightheadedness, shortness of breath.  Vitals are stable on arrival.  Abdomen is soft and benign.  Fecal occult is positive.  Baseline hemoglobin appears to be around 9, he has downtrended to 6.7 today.  Will plan for transfusion.  CT of the abdomen pelvis ordered.  With ongoing bleeding and anemia requiring transfusion will admit to hospitalist.  GI doctor aware with secure chat.  Of note when patient returned from CT he mentions some mild chest heaviness.  EKG was done.  This is normal sinus rhythm.  There are lateral ST depressions that looks similar to his EKG in September.  No STEMI criteria.  Troponin ordered and is pending.  Admitting hospitalist notified.  Patients evaluation and results requires admission for further treatment and care.  Spoke with hospitalist, reviewed patient's ED course and they accept admission.  Patient agrees with admission plan, offers no new complaints and is stable/unchanged at time of admit.        Final Clinical Impression(s) / ED Diagnoses Final diagnoses:  None    Rx / DC Orders ED Discharge Orders     None         Rozelle Logan, DO 12/16/22 2204

## 2022-12-17 ENCOUNTER — Telehealth: Payer: Self-pay | Admitting: Cardiovascular Disease

## 2022-12-17 ENCOUNTER — Encounter (HOSPITAL_COMMUNITY): Payer: Self-pay | Admitting: Internal Medicine

## 2022-12-17 DIAGNOSIS — I255 Ischemic cardiomyopathy: Secondary | ICD-10-CM | POA: Diagnosis not present

## 2022-12-17 DIAGNOSIS — I10 Essential (primary) hypertension: Secondary | ICD-10-CM | POA: Diagnosis not present

## 2022-12-17 DIAGNOSIS — N1831 Chronic kidney disease, stage 3a: Secondary | ICD-10-CM | POA: Insufficient documentation

## 2022-12-17 DIAGNOSIS — K922 Gastrointestinal hemorrhage, unspecified: Secondary | ICD-10-CM | POA: Diagnosis not present

## 2022-12-17 DIAGNOSIS — D649 Anemia, unspecified: Secondary | ICD-10-CM | POA: Diagnosis not present

## 2022-12-17 LAB — CBC
HCT: 22.3 % — ABNORMAL LOW (ref 39.0–52.0)
HCT: 23.9 % — ABNORMAL LOW (ref 39.0–52.0)
HCT: 23.8 % — ABNORMAL LOW (ref 39.0–52.0)
HCT: 23.8 % — ABNORMAL LOW (ref 39.0–52.0)
Hemoglobin: 7.4 g/dL — ABNORMAL LOW (ref 13.0–17.0)
Hemoglobin: 7.8 g/dL — ABNORMAL LOW (ref 13.0–17.0)
Hemoglobin: 7.8 g/dL — ABNORMAL LOW (ref 13.0–17.0)
Hemoglobin: 7.9 g/dL — ABNORMAL LOW (ref 13.0–17.0)
MCH: 31 pg (ref 26.0–34.0)
MCH: 31 pg (ref 26.0–34.0)
MCH: 31.3 pg (ref 26.0–34.0)
MCH: 30.9 pg (ref 26.0–34.0)
MCHC: 33.2 g/dL (ref 30.0–36.0)
MCHC: 32.6 g/dL (ref 30.0–36.0)
MCHC: 32.8 g/dL (ref 30.0–36.0)
MCHC: 33.2 g/dL (ref 30.0–36.0)
MCV: 93.3 fL (ref 80.0–100.0)
MCV: 94.8 fL (ref 80.0–100.0)
MCV: 95.6 fL (ref 80.0–100.0)
MCV: 93 fL (ref 80.0–100.0)
Platelets: 211 10*3/uL (ref 150–400)
Platelets: 223 10*3/uL (ref 150–400)
Platelets: 227 10*3/uL (ref 150–400)
Platelets: 229 10*3/uL (ref 150–400)
RBC: 2.39 MIL/uL — ABNORMAL LOW (ref 4.22–5.81)
RBC: 2.52 MIL/uL — ABNORMAL LOW (ref 4.22–5.81)
RBC: 2.49 MIL/uL — ABNORMAL LOW (ref 4.22–5.81)
RBC: 2.56 MIL/uL — ABNORMAL LOW (ref 4.22–5.81)
RDW: 14.1 % (ref 11.5–15.5)
RDW: 14.5 % (ref 11.5–15.5)
RDW: 14.3 % (ref 11.5–15.5)
RDW: 14.4 % (ref 11.5–15.5)
WBC: 6.4 10*3/uL (ref 4.0–10.5)
WBC: 7.5 10*3/uL (ref 4.0–10.5)
WBC: 6.6 10*3/uL (ref 4.0–10.5)
WBC: 7 10*3/uL (ref 4.0–10.5)
nRBC: 0 % (ref 0.0–0.2)
nRBC: 0 % (ref 0.0–0.2)
nRBC: 0 % (ref 0.0–0.2)
nRBC: 0 % (ref 0.0–0.2)

## 2022-12-17 LAB — COMPREHENSIVE METABOLIC PANEL
ALT: 13 U/L (ref 0–44)
AST: 16 U/L (ref 15–41)
Albumin: 2.9 g/dL — ABNORMAL LOW (ref 3.5–5.0)
Alkaline Phosphatase: 61 U/L (ref 38–126)
Anion gap: 9 (ref 5–15)
BUN: 34 mg/dL — ABNORMAL HIGH (ref 8–23)
CO2: 21 mmol/L — ABNORMAL LOW (ref 22–32)
Calcium: 9.6 mg/dL (ref 8.9–10.3)
Chloride: 107 mmol/L (ref 98–111)
Creatinine, Ser: 1.62 mg/dL — ABNORMAL HIGH (ref 0.61–1.24)
GFR, Estimated: 43 mL/min — ABNORMAL LOW (ref >60)
Glucose, Bld: 134 mg/dL — ABNORMAL HIGH (ref 70–99)
Potassium: 3.7 mmol/L (ref 3.5–5.1)
Sodium: 137 mmol/L (ref 135–145)
Total Bilirubin: 0.6 mg/dL (ref 0.3–1.2)
Total Protein: 6.2 g/dL — ABNORMAL LOW (ref 6.5–8.1)

## 2022-12-17 LAB — TYPE AND SCREEN
ABO/RH(D): A POS
Antibody Screen: NEGATIVE
Unit division: 0

## 2022-12-17 LAB — BPAM RBC
Blood Product Expiration Date: 202411062359
ISSUE DATE / TIME: 202410082123
Unit Type and Rh: 6200

## 2022-12-17 MED ORDER — ENZALUTAMIDE 40 MG PO CAPS: 160.0000 mg | ORAL_CAPSULE | Freq: Every day | ORAL | Status: DC

## 2022-12-17 MED ORDER — ENZALUTAMIDE 40 MG PO CAPS
160.0000 mg | ORAL_CAPSULE | Freq: Every day | ORAL | Status: DC
  Administered 2022-12-17 – 2022-12-21 (×5): 160 mg via ORAL
  Filled 2022-12-17 (×7): qty 4

## 2022-12-17 MED ORDER — LACTATED RINGERS IV SOLN: INTRAVENOUS | Status: DC

## 2022-12-17 MED ORDER — ALLOPURINOL 300 MG PO TABS
300.0000 mg | ORAL_TABLET | Freq: Every day | ORAL | Status: DC
  Administered 2022-12-17 – 2022-12-22 (×6): 300 mg via ORAL
  Filled 2022-12-17 (×2): qty 1
  Filled 2022-12-17: qty 3
  Filled 2022-12-17 (×3): qty 1

## 2022-12-17 MED ORDER — POTASSIUM CHLORIDE CRYS ER 20 MEQ PO TBCR
40.0000 meq | EXTENDED_RELEASE_TABLET | Freq: Once | ORAL | Status: AC
  Administered 2022-12-17: 40 meq via ORAL
  Filled 2022-12-17: qty 2

## 2022-12-17 MED ORDER — AMLODIPINE BESYLATE 5 MG PO TABS
5.0000 mg | ORAL_TABLET | Freq: Once | ORAL | Status: AC
  Administered 2022-12-17: 5 mg via ORAL
  Filled 2022-12-17: qty 1

## 2022-12-17 MED ORDER — LABETALOL HCL 5 MG/ML IV SOLN: 10.0000 mg | INTRAVENOUS | Status: DC | PRN

## 2022-12-17 MED ORDER — AMLODIPINE BESYLATE 5 MG PO TABS
5.0000 mg | ORAL_TABLET | Freq: Every day | ORAL | Status: DC
  Administered 2022-12-17: 5 mg via ORAL
  Filled 2022-12-17: qty 1

## 2022-12-17 MED ORDER — RELUGOLIX 120 MG PO TABS: 120.0000 mg | ORAL_TABLET | Freq: Every day | ORAL | Status: DC

## 2022-12-17 MED ORDER — PANTOPRAZOLE SODIUM 40 MG IV SOLR
40.0000 mg | Freq: Two times a day (BID) | INTRAVENOUS | Status: DC
  Administered 2022-12-17 (×3): 40 mg via INTRAVENOUS
  Filled 2022-12-17 (×3): qty 10

## 2022-12-17 MED ORDER — RELUGOLIX 120 MG PO TABS
120.0000 mg | ORAL_TABLET | Freq: Every day | ORAL | Status: DC
  Administered 2022-12-17 – 2022-12-21 (×5): 120 mg via ORAL
  Filled 2022-12-17 (×6): qty 1

## 2022-12-17 MED ORDER — AMLODIPINE BESYLATE 10 MG PO TABS
10.0000 mg | ORAL_TABLET | Freq: Every day | ORAL | Status: DC
  Administered 2022-12-18 – 2022-12-21 (×3): 10 mg via ORAL
  Filled 2022-12-17 (×5): qty 1

## 2022-12-17 NOTE — Assessment & Plan Note (Addendum)
Last EF of 40 to 45% in September 2024.  Not volume overloaded.  Currently not on DAPT due to recurrent GI bleeding. Will ask cardiology to weigh in. 12-18-2022 pt seen by cardiology. Due to pt being >1 month out from his PCI, cardiology has recommended single agent therapy with Plavix only. No ASA.

## 2022-12-17 NOTE — Assessment & Plan Note (Addendum)
Stable on 10 mg norvasc daily. Continue to monitor. Hold for SBP <120

## 2022-12-17 NOTE — Hospital Course (Addendum)
HPI: Ricky Rowland. is a 77 y.o. male with medical history significant of recent GI bleed with workup performed on 924 with EGD who is on dual antiplatelet therapy at home, history of coronary artery disease status post recent PCI again in September of this year.  Patient has been on aspirin and Effient afterwards, hypertension, hyperlipidemia, MGUS, chronic kidney disease stage IIIb and hyperlipidemia who presented to the ER today with recurrent GI bleed.  Patient was evaluated previously for the GI bleed after he was on dual antiplatelet therapy as a result of his coronary artery disease.  The EGD that he had done did not show any source of bleeding.  His H&H was stable at that time and was placed back on his dual antiplatelet therapy.  Patient however is back here again with another episode of melena with subsequent drop in his hemoglobin.  At time of discharge hemoglobin was around 9.9 g.  It is now dropped to 6.7.  Patient reported seeing the melena again.  At this time therefore is being admitted for management of GI bleed.  Denied any nausea or vomiting.  Denied any bright red blood per rectum but mainly the melena.  Patient however is having some weakness and lightheadedness.  He is being admitted for further evaluation and treatment.   Has had recurrent melanotic stools.  Just had a heart attack at the beginning of September 2028.  Had a left heart cath.  Required stenting.  Has a drug-eluting stent in the mid LAD.  Was placed on dual antiplatelet.  He was discharged from the hospital on December 05, 2022.  At that time he was discharged to home on aspirin and Plavix.  His Brilinta had been stopped.  Presents now back with GI bleeding.  Received 1 unit of blood.  GI has been consulted.  Has not had a colonoscopy for at least 2 years.  Already had an EGD done last month that showed some mild gastritis in the antrum.  Significant Events: Admitted 12/16/2022 Repeat EGD 12-18-2022. No source of  upper GI bleeding found 12-20-2022. Orthostatic this AM. Sitting BP 105/56. Standing BP 86/53. Pt transfused 1 unit PRBC  Significant Labs: Admitting HgB 6.7 g/dl HgB 7.4 after 1 unit PRBC transfusion  Significant Imaging Studies: Admitting CT abd shows 1. No CT evidence for acute intra-abdominal or pelvic abnormality. 2. Diverticular disease of the left colon without acute inflammatory process. 3. Cholelithiasis. 4. Sclerotic lesion at L4 consistent with metastatic disease. 5. Aortic atherosclerosis.  Antibiotic Therapy: Anti-infectives (From admission, onward)    Start     Dose/Rate Route Frequency Ordered Stop   12/18/22 1130  fluconazole (DIFLUCAN) tablet 100 mg        100 mg Oral Daily 12/18/22 1036 12/25/22 0959       Procedures: 1 unit PRBC transfused 12-17-2022 EGD 12-18-2022 Findings: Patchy, white plaques were found in the entire esophagus. Biopsy is contraindicated because was on plavix 2 days ago. A widely patent Schatzki ring was found at the gastroesophageal junction. A 2 cm hiatal hernia was present. The entire examined stomach was normal. The cardia and gastric fundus were normal on retroflexion. The examined duodenum was normal. Using the endoscope, the video capsule enteroscope was advanced into the duodenal bulb. Impression:  - Esophageal plaques were found, suspicious for candidiasis. Biopsy is contraindicated. - Widely patent Schatzki ring. 2 cm hiatal hernia.  - Normal stomach.- Normal examined duodenum.- Successful completion of the Video Capsule  Enteroscope placement.- No specimens collected. Enteroscopy 12-21-2022 A small hiatal hernia was present.  Diffuse mild inflammation characterized by congestion (edema) and granularity was found in the entire examined stomach.  Two angioectasias with no bleeding were found in the second portion of the duodenum and in the fourth portion of the duodenum. Fulguration to ablate the lesion to prevent bleeding by argon plasma at  2 liters/minute and 20 watts was successful. Two angioectasias with no bleeding were found in the proximal jejunum and in the mid-jejunum. Fulguration to ablate the lesion to prevent bleeding by argon plasma   Consultants: GI [name]

## 2022-12-17 NOTE — Assessment & Plan Note (Addendum)
Due to GI bleeding.  Status post 1 unit of packed cell transfusion.  Hemoglobin stable.  Was 6.7 g/dl prior to transfusion.  12-20-2022. Pt noted to have orthostatic hypotension the AM. Sitting BP 105/56. Standing BP 86/53. Will give 1 unit PRBC. Discussed with dtr repeatedly that more frequent checking of HgB will only make pt more anemic. Had to review this several times.  12/22/22 patient has been transfused a total of 2 units of packed red blood cells during this hospitalization.Marland Kitchen  Discharge hemoglobin is 9.1 g/dL.

## 2022-12-17 NOTE — ED Notes (Signed)
ED TO INPATIENT HANDOFF REPORT  ED Nurse Name and Phone #: Cat [phone]  S Name/Age/Gender Ricky Panther Sr. 77 y.o. male Room/Bed: 007C/007C  Code Status   Code Status: Full Code  Home/SNF/Other Home Patient oriented to: self, place, time, and situation Is this baseline? Yes   Triage Complete: Triage complete  Chief Complaint GI bleed [K92.2]  Triage Note Per [facility] pt coming from home c/o dark stools since sept 23rd. Patient had labs drawn today with hemoglobin 7.    Allergies Allergies  Allergen Reactions   Ezetimibe Other (See Comments)    Muscle aches   Statins Other (See Comments)    Joint pain    Level of Care/Admitting Diagnosis ED Disposition     ED Disposition  Admit   Condition  --   Comment  Hospital Area: [facility] [[id number]]  Level of Care: Telemetry Medical [104]  May admit patient to [facility] or [facility] if equivalent level of care is available:: Yes  Covid Evaluation: Asymptomatic - no recent exposure (last 10 days) testing not required  Diagnosis: GI bleed [454098]  Admitting Physician: Rometta Emery [[id number]]  Attending Physician: Rometta Emery [[id number]]  Certification:: I certify this patient will need inpatient services for at least 2 midnights  Expected Medical Readiness: 12/19/2022          B Medical/Surgery History Past Medical History:  Diagnosis Date   Acute kidney injury superimposed on chronic kidney disease (HCC) 11/19/2022   Borderline diabetes    DIET CONTROLLED   Bradycardia 11/17/2022   Dyslipidemia    Elevated PSA    External carotid artery stenosis    MILD-- BILATERAL   Fall 11/05/2017   GERD (gastroesophageal reflux disease)    Gout, arthritis    Hearing loss in left ear    Heart murmur    MILD --  ASYMPTOMATIC   History of gastric ulcer    REMOTE HX   Hypertension    Pre-diabetes    Prostate cancer (HCC) 12/21/2012   Gleason 3+4=7   Seizure-like activity (HCC)    Syncope  and collapse    Wears glasses    Past Surgical History:  Procedure Laterality Date   CORONARY STENT INTERVENTION N/A 11/16/2022   Procedure: CORONARY STENT INTERVENTION;  Surgeon: Elder Negus, MD;  Location: [facility] INVASIVE CV LAB;  Service: Cardiovascular;  Laterality: N/A;   CORONARY ULTRASOUND/IVUS N/A 11/16/2022   Procedure: Coronary Ultrasound/IVUS;  Surgeon: Elder Negus, MD;  Location: [facility] INVASIVE CV LAB;  Service: Cardiovascular;  Laterality: N/A;   ESOPHAGOGASTRODUODENOSCOPY (EGD) WITH PROPOFOL N/A 12/02/2022   Procedure: ESOPHAGOGASTRODUODENOSCOPY (EGD) WITH PROPOFOL;  Surgeon: Charlott Rakes, MD;  Location: [facility] ENDOSCOPY;  Service: Gastroenterology;  Laterality: N/A;   KNEE ARTHROSCOPY W/ MENISCAL REPAIR Left 03/10/2012   LEFT HEART CATH AND CORONARY ANGIOGRAPHY N/A 11/16/2022   Procedure: LEFT HEART CATH AND CORONARY ANGIOGRAPHY;  Surgeon: Elder Negus, MD;  Location: [facility]VASIVE CV LAB;  Service: Cardiovascular;  Laterality: N/A;   LOOP RECORDER INSERTION N/A 01/05/2018   Procedure: LOOP RECORDER INSERTION;  Surgeon: Duke Salvia, MD;  Location: [facility] INVASIVE CV LAB;  Service: Cardiovascular;  Laterality: N/A;   LUMBAR SPINE SURGERY  03/10/1989   PROSTATE BIOPSY N/A 12/21/2012   Procedure: BIOPSY TRANSRECTAL ULTRASONIC PROSTATE (TUBP);  Surgeon: Lindaann Slough, MD;  Location: [facility];  Service: Urology;  Laterality: N/A;   PROSTATE BIOPSY  11/26/2011   benign   TEMPORARY PACEMAKER N/A 11/16/2022  Procedure: TEMPORARY PACEMAKER;  Surgeon: Elder Negus, MD;  Location: [facility]VAS[facility] CV LAB;  Service: Cardiovascular;  Laterality: N/A;   TONSILLECTOMY     WISDOM TOOTH EXTRACTION       A IV Location/Drains/Wounds Patient Lines/Drains/Airways Status     Active Line/Drains/Airways     Name Placement date Placement time Site Days   Peripheral IV 12/16/22 Posterior;Right Hand 12/16/22  1829  Hand  1   Wound / Incision (Open or Dehisced)  12/02/22 Laceration Head Left;Upper 12/02/22  1423  Head  15            Intake/Output Last 24 hours  Intake/Output Summary (Last 24 hours) at 12/17/2022 1445 Last data filed at 12/17/2022 0031 Gross per 24 hour  Intake 334 ml  Output --  Net 334 ml    Labs/Imaging Results for orders placed or performed during the hospital encounter of 12/16/22 (from the past 48 hour(s))  Comprehensive metabolic panel     Status: Abnormal   Collection Time: 12/16/22  6:25 PM  Result Value Ref Range   Sodium 135 135 - 145 mmol/L   Potassium 4.0 3.5 - 5.1 mmol/L   Chloride 103 98 - 111 mmol/L   CO2 20 (L) 22 - 32 mmol/L   Glucose, Bld 107 (H) 70 - 99 mg/dL    Comment: Glucose reference range applies only to samples taken after fasting for at least 8 hours.   BUN 41 (H) 8 - 23 mg/dL   Creatinine, Ser 0.86 (H) 0.61 - 1.24 mg/dL   Calcium 9.5 8.9 - 57.8 mg/dL   Total Protein 6.8 6.5 - 8.1 g/dL   Albumin 3.3 (L) 3.5 - 5.0 g/dL   AST 18 15 - 41 U/L   ALT 14 0 - 44 U/L   Alkaline Phosphatase 70 38 - 126 U/L   Total Bilirubin 0.4 0.3 - 1.2 mg/dL   GFR, Estimated 39 (L) >60 mL/min    Comment: (NOTE) Calculated using the CKD-EPI Creatinine Equation (2021)    Anion gap 12 5 - 15    Comment: Performed at [facility], [address] [place], [place] [address]  CBC     Status: Abnormal   Collection Time: 12/16/22  6:25 PM  Result Value Ref Range   WBC 6.3 4.0 - 10.5 K/uL   RBC 2.11 (L) 4.22 - 5.81 MIL/uL   Hemoglobin 6.7 (LL) 13.0 - 17.0 g/dL    Comment: REPEATED TO VERIFY THIS CRITICAL RESULT HAS VERIFIED AND BEEN CALLED TO Kirtland Bouchard ROSS, RN BY SHAY EKDAHL ON 10 08 2024 AT 1912, AND HAS BEEN READ BACK.     HCT 20.6 (L) 39.0 - 52.0 %   MCV 97.6 80.0 - 100.0 fL   MCH 31.8 26.0 - 34.0 pg   MCHC 32.5 30.0 - 36.0 g/dL   RDW 95.2 84.1 - 32.4 %   Platelets 239 150 - 400 K/uL    Comment: REPEATED TO VERIFY   nRBC 0.0 0.0 - 0.2 %    Comment: Performed at [facility], [address],  [place], [place] [address]  Type and screen [facility]     Status: None   Collection Time: 12/16/22  6:25 PM  Result Value Ref Range   ABO/RH(D) A POS    Antibody Screen NEG    Sample Expiration 12/19/2022,2359    Unit Number [id number]    Blood Component Type RED CELLS,LR    Unit division 00  Status of Unit ISSUED,FINAL    Transfusion Status OK TO TRANSFUSE    Crossmatch Result      Compatible Performed at [facility], [address], [place], [place] [address]   Prepare RBC (crossmatch)     Status: None   Collection Time: 12/16/22  9:03 PM  Result Value Ref Range   Order Confirmation      ORDER PROCESSED BY BLOOD BANK Performed at [facility], [address] [place], [place] [address]   POC occult blood, ED     Status: Abnormal   Collection Time: 12/16/22  9:24 PM  Result Value Ref Range   Fecal Occult Bld POSITIVE (A) NEGATIVE  Troponin I (High Sensitivity)     Status: Abnormal   Collection Time: 12/16/22  9:45 PM  Result Value Ref Range   Troponin I (High Sensitivity) 18 (H) <18 ng/L    Comment: (NOTE) Elevated high sensitivity troponin I (hsTnI) values and significant  changes across serial measurements may suggest ACS but many other  chronic and acute conditions are known to elevate hsTnI results.  Refer to the "Links" section for chest pain algorithms and additional  guidance. Performed at [facility], [address] [place], [place] [address]   Troponin I (High Sensitivity)     Status: Abnormal   Collection Time: 12/17/22 12:31 AM  Result Value Ref Range   Troponin I (High Sensitivity) 40 (H) <18 ng/L    Comment: (NOTE) Elevated high sensitivity troponin I (hsTnI) values and significant  changes across serial measurements may suggest ACS but many other  chronic and acute conditions are known to elevate hsTnI results.  Refer to the "Links" section for chest pain algorithms and additional  guidance. Performed at [facility], [address], [place], [place] [address]   Comprehensive metabolic panel     Status: Abnormal   Collection Time: 12/17/22  4:19 AM  Result Value Ref Range   Sodium 137 135 - 145 mmol/L   Potassium 3.7 3.5 - 5.1 mmol/L   Chloride 107 98 - 111 mmol/L   CO2 21 (L) 22 - 32 mmol/L   Glucose, Bld 134 (H) 70 - 99 mg/dL    Comment: Glucose reference range applies only to samples taken after fasting for at least 8 hours.   BUN 34 (H) 8 - 23 mg/dL   Creatinine, Ser 4.40 (H) 0.61 - 1.24 mg/dL   Calcium 9.6 8.9 - 34.7 mg/dL   Total Protein 6.2 (L) 6.5 - 8.1 g/dL   Albumin 2.9 (L) 3.5 - 5.0 g/dL   AST 16 15 - 41 U/L   ALT 13 0 - 44 U/L   Alkaline Phosphatase 61 38 - 126 U/L   Total Bilirubin 0.6 0.3 - 1.2 mg/dL   GFR, Estimated 43 (L) >60 mL/min    Comment: (NOTE) Calculated using the CKD-EPI Creatinine Equation (2021)    Anion gap 9 5 - 15    Comment: Performed at [facility], [address] [place], [place] [address]  CBC     Status: Abnormal   Collection Time: 12/17/22  4:19 AM  Result Value Ref Range   WBC 6.4 4.0 - 10.5 K/uL   RBC 2.39 (L) 4.22 - 5.81 MIL/uL   Hemoglobin 7.4 (L) 13.0 - 17.0 g/dL   HCT 63.8 (L) 75.6 - 43.3 %   MCV 93.3 80.0 - 100.0 fL   MCH 31.0 26.0 - 34.0 pg  MCHC 33.2 30.0 - 36.0 g/dL   RDW 16.1 09.6 - 04.5 %   Platelets 211 150 - 400 K/uL   nRBC 0.0 0.0 - 0.2 %    Comment: Performed at [facility], [address], [place], [place] [address]  CBC     Status: Abnormal   Collection Time: 12/17/22  9:59 AM  Result Value Ref Range   WBC 7.5 4.0 - 10.5 K/uL   RBC 2.52 (L) 4.22 - 5.81 MIL/uL   Hemoglobin 7.8 (L) 13.0 - 17.0 g/dL   HCT 19.1 (L) 47.8 - 29.5 %   MCV 94.8 80.0 - 100.0 fL   MCH 31.0 26.0 - 34.0 pg   MCHC 32.6 30.0 - 36.0 g/dL   RDW 62.1 30.8 - 65.7 %   Platelets 223 150 - 400 K/uL   nRBC 0.0 0.0 - 0.2 %    Comment: Performed at [facility], [address] [place], [place] [address]   CT ABDOMEN PELVIS W  CONTRAST  Result Date: 12/16/2022 CLINICAL DATA:  Abdomen pain anemia rectal bleeding, history of prostate cancer EXAM: CT ABDOMEN AND PELVIS WITH CONTRAST TECHNIQUE: Multidetector CT imaging of the abdomen and pelvis was performed using the standard protocol following bolus administration of intravenous contrast. RADIATION DOSE REDUCTION: This exam was performed according to the departmental dose-optimization program which includes automated exposure control, adjustment of the mA and/or kV according to patient size and/or use of iterative reconstruction technique. CONTRAST:  60mL OMNIPAQUE IOHEXOL 350 MG/ML SOLN COMPARISON:  PET CT 05/09/2022 FINDINGS: Lower chest: Lung bases demonstrate no acute airspace disease. Mild coronary vascular calcification Hepatobiliary: No focal hepatic abnormality or biliary dilatation. Small gallstones. Pancreas: Unremarkable. No pancreatic ductal dilatation or surrounding inflammatory changes. Spleen: Normal in size without focal abnormality. Adrenals/Urinary Tract: Adrenal glands are unremarkable. Kidneys are normal, without renal calculi, focal lesion, or hydronephrosis. Bladder is unremarkable. Stomach/Bowel: Stomach is within normal limits. Appendix appears normal. No evidence of bowel wall thickening, distention, or inflammatory changes. Diverticular disease of the left colon Vascular/Lymphatic: Moderate aortic atherosclerosis. No aneurysm. No suspicious lymph nodes Reproductive: Fiducial markers in the prostate. Penile prosthesis with reservoir in the right anterior pelvis indenting the bladder Other: Negative for pelvic effusion or free air Musculoskeletal: No acute osseous abnormality. Sclerotic lesion involving the left pedicle and posterior arch at L4 consistent with metastatic disease. IMPRESSION: 1. No CT evidence for acute intra-abdominal or pelvic abnormality. 2. Diverticular disease of the left colon without acute inflammatory process. 3. Cholelithiasis. 4. Sclerotic  lesion at L4 consistent with metastatic disease. 5. Aortic atherosclerosis. Aortic Atherosclerosis (ICD10-I70.0). Electronically Signed   By: Jasmine Pang M.D.   On: 12/16/2022 21:47    Pending Labs Unresulted Labs (From admission, onward)     Start     Ordered   12/17/22 0313  CBC  Now then every 6 hours,   R (with TIMED occurrences)      12/17/22 0313            Vitals/Pain Today's Vitals   12/17/22 0930 12/17/22 1000 12/17/22 1200 12/17/22 1226  BP: (!) 153/71 (!) 149/76 (!) 164/94   Pulse: 63 63 64   Resp: 14 15 16    Temp:    98.4 F (36.9 C)  TempSrc:    Oral  SpO2: 100% 100% 100%   Weight:      Height:      PainSc:        Isolation Precautions No active isolations  Medications Medications  pantoprazole (PROTONIX) injection 40 mg (40 mg Intravenous Given 12/17/22 1003)  nitroGLYCERIN (NITROSTAT) SL tablet 0.4 mg (0 mg Sublingual Hold 12/16/22 2249)  allopurinol (ZYLOPRIM) tablet 300 mg (has no administration in time range)  relugolix (ORGOVYX) tablet 120 mg (has no administration in time range)  enzalutamide (XTANDI) capsule 160 mg (has no administration in time range)  amLODipine (NORVASC) tablet 5 mg (has no administration in time range)  0.9 %  sodium chloride infusion (Manually program via Guardrails IV Fluids) (0 mLs Intravenous Stopped 12/16/22 2147)  iohexol (OMNIPAQUE) 350 MG/ML injection 60 mL (60 mLs Intravenous Contrast Given 12/16/22 2139)    Mobility Walks with assist     Focused Assessments     R Recommendations: See Admitting Provider Note  Report given to:   Additional Notes:

## 2022-12-17 NOTE — H&P (View-Only) (Signed)
[facility] Consult  Referring Provider: ER Primary Care Physician:  [name], MD Primary Gastroenterologist: Dr. Bosie Clos  Reason for Consultation: Anemia and dark stools  HPI: Ricky Rowland. is a 77 y.o. male states that he has been having dark-colored stools and denies use of iron. He denies noticing bright red blood in stool. Denies unintentional weight loss or loss of appetite. Denies acid reflux, heartburn, difficulty swallowing, pain on swallowing, bloating or change in bowel habits.  He recently had an endoscopy on 12/02/2022 for suspected melena:gastritis and nodule in antrum.  Recently had stent placement on 11/16/2022 to LAD for CAD, dual antiplatelet therapy with Plavix and aspirin started on 12/02/2022(was previously on Effient/Prasugrel)  He has history of carotid stenosis and CVA,monoclonal gammopathy of undetermined significance, prostate cancer status post radiation and on Xtandi.  Patient states he has not noted bright red blood in stool.  He states stools are unusually dark sometimes even black.  Previous GI workup: EGD 12/02/2022 Colonoscopy 10/22, Dr. Bosie Clos: Diverticulosis of entire colon, tattoo):, Scar noted ectomy: Internal hemorrhoids, repeat recommended in 5 years Colonoscopy 07/2017, Dr. Bosie Clos: 4 tubular adenomas removed, diverticulosis EGD 07/2017, Dr. Bosie Clos: Barrett's esophagus, repeat recommended in 3 years  Past Medical History:  Diagnosis Date   Acute kidney injury superimposed on chronic kidney disease (HCC) 11/19/2022   Borderline diabetes    DIET CONTROLLED   Bradycardia 11/17/2022   Dyslipidemia    Elevated PSA    External carotid artery stenosis    MILD-- BILATERAL   Fall 11/05/2017   GERD (gastroesophageal reflux disease)    Gout, arthritis    Hearing loss in left ear    Heart murmur    MILD --  ASYMPTOMATIC   History of gastric ulcer    REMOTE HX   Hypertension    Pre-diabetes    Prostate cancer (HCC)  12/21/2012   Gleason 3+4=7   Seizure-like activity (HCC)    Syncope and collapse    Wears glasses     Past Surgical History:  Procedure Laterality Date   CORONARY STENT INTERVENTION N/A 11/16/2022   Procedure: CORONARY STENT INTERVENTION;  Surgeon: Elder Negus, MD;  Location: [facility];  Service: Cardiovascular;  Laterality: N/A;   CORONARY ULTRASOUND/IVUS N/A 11/16/2022   Procedure: Coronary Ultrasound/IVUS;  Surgeon: Elder Negus, MD;  Location: [facility];  Service: Cardiovascular;  Laterality: N/A;   ESOPHAGOGASTRODUODENOSCOPY (EGD) WITH PROPOFOL N/A 12/02/2022   Procedure: ESOPHAGOGASTRODUODENOSCOPY (EGD) WITH PROPOFOL;  Surgeon: Charlott Rakes, MD;  Location[facility] END[facility]COPY;  Service: Gastroenterology;  Laterality: N/A;   KNEE ARTHROSCOPY W/ MENISCAL REPAIR Left 03/10/2012   LEFT HEART CATH AND CORONARY ANGIOGRAPHY N/A 11/16/2022   Procedure: LEFT HEART CATH AND CORONARY ANGIOGRAPHY;  Surgeon: Elder Negus, MD;  Location: [facility];  Service: Cardiovascular;  Laterality: N/A;   LOOP RECORDER INSERTION N/A 01/05/2018   Procedure: LOOP RECORDER INSERTION;  Surgeon: Duke Salvia, MD;  Location: [facility] INVASIVE CV LAB;  Service: Cardiovascular;  Laterality: N/A;   LUMBAR SPINE SURGERY  03/10/1989   PROSTATE BIOPSY N/A 12/21/2012   Procedure: BIOPSY TRANSRECTAL ULTRASONIC PROSTATE (TUBP);  Surgeon: Lindaann Slough, MD;  Location: [facility];  Service: Urology;  Laterality: N/A;   PROSTATE BIOPSY  11/26/2011   benign   TEMPORARY PACEMAKER N/A 11/16/2022   Procedure: TEMPORARY PACEMAKER;  Surgeon: Elder Negus, MD;  Location: [facility];  Service: Cardiovascular;  Laterality: N/A;   TONSILLECTOMY  WISDOM TOOTH EXTRACTION      Prior to Admission medications   Medication Sig Start Date End Date Taking? Authorizing Provider  acetaminophen (TYLENOL) 650 MG CR tablet Take 650 mg by mouth every 8 (eight) hours as  needed for pain.   Yes [provider]  allopurinol (ZYLOPRIM) 300 MG tablet Take 300 mg by mouth daily.   Yes [provider]  amLODipine (NORVASC) 10 MG tablet Take 1 tablet (10 mg total) by mouth daily. 12/05/22  Yes Leroy Sea, MD  aspirin EC 81 MG tablet Take 1 tablet (81 mg total) by mouth daily. Swallow whole. 11/19/22  Yes Dunn, Dayna N, PA-C  clopidogrel (PLAVIX) 75 MG tablet Take 1 tablet (75 mg total) by mouth daily. 12/05/22  Yes Leroy Sea, MD  docusate sodium (COLACE) 100 MG capsule Take 2 capsules (200 mg total) by mouth daily as needed for mild constipation. 12/05/22  Yes Leroy Sea, MD  Multiple Vitamins-Minerals (MULTIVITAMIN ADULT EXTRA C PO) Take 1 tablet by mouth daily. 07/24/22  Yes [provider]  Naphazoline HCl (CLEAR EYES OP) Place 1 drop into both eyes daily as needed (dry eyes).    Yes [provider]  nitroGLYCERIN (NITROSTAT) 0.4 MG SL tablet Place 1 tablet (0.4 mg total) under the tongue every 5 (five) minutes as needed for chest pain (up to 3 doses). 11/19/22  Yes Dunn, Dayna N, PA-C  ORGOVYX 120 MG tablet Take 120 mg by mouth daily. 05/19/22  Yes [provider]  pantoprazole (PROTONIX) 40 MG tablet Take 1 tablet (40 mg total) by mouth 2 (two) times daily before a meal. 12/05/22  Yes Leroy Sea, MD  polyethylene glycol powder (GLYCOLAX/MIRALAX) 17 GM/SCOOP powder Take 1 capful (17 g) with water by mouth daily as needed for mild constipation. 12/05/22  Yes Leroy Sea, MD  REPATHA SURECLICK 140 MG/ML SOAJ Inject 140 mg into the skin every 14 (fourteen) days.  06/07/17  Yes [provider]  tolnaftate (TINACTIN) 1 % cream Apply 1 application topically daily as needed (jock itch).   Yes [provider]  XTANDI 40 MG capsule Take 160 mg by mouth daily.   Yes [provider]    Current Facility-Administered Medications  Medication Dose Route Frequency Provider Last Rate Last  Admin   nitroGLYCERIN (NITROSTAT) SL tablet 0.4 mg  0.4 mg Sublingual Q5 min PRN Earlie Lou L, MD   0.4 mg at 12/16/22 2201   pantoprazole (PROTONIX) injection 40 mg  40 mg Intravenous Q12H Rometta Emery, MD   40 mg at 12/17/22 0400   Current Outpatient Medications  Medication Sig Dispense Refill   acetaminophen (TYLENOL) 650 MG CR tablet Take 650 mg by mouth every 8 (eight) hours as needed for pain.     allopurinol (ZYLOPRIM) 300 MG tablet Take 300 mg by mouth daily.     amLODipine (NORVASC) 10 MG tablet Take 1 tablet (10 mg total) by mouth daily. 30 tablet 0   aspirin EC 81 MG tablet Take 1 tablet (81 mg total) by mouth daily. Swallow whole. 120 tablet 2   clopidogrel (PLAVIX) 75 MG tablet Take 1 tablet (75 mg total) by mouth daily. 30 tablet 0   docusate sodium (COLACE) 100 MG capsule Take 2 capsules (200 mg total) by mouth daily as needed for mild constipation. 100 capsule 0   Multiple Vitamins-Minerals (MULTIVITAMIN ADULT EXTRA C PO) Take 1 tablet by mouth daily.     Naphazoline HCl (CLEAR  EYES OP) Place 1 drop into both eyes daily as needed (dry eyes).      nitroGLYCERIN (NITROSTAT) 0.4 MG SL tablet Place 1 tablet (0.4 mg total) under the tongue every 5 (five) minutes as needed for chest pain (up to 3 doses). 25 tablet 3   ORGOVYX 120 MG tablet Take 120 mg by mouth daily.     pantoprazole (PROTONIX) 40 MG tablet Take 1 tablet (40 mg total) by mouth 2 (two) times daily before a meal. 60 tablet 1   polyethylene glycol powder (GLYCOLAX/MIRALAX) 17 GM/SCOOP powder Take 1 capful (17 g) with water by mouth daily as needed for mild constipation. 238 g 0   REPATHA SURECLICK 140 MG/ML SOAJ Inject 140 mg into the skin every 14 (fourteen) days.      tolnaftate (TINACTIN) 1 % cream Apply 1 application topically daily as needed (jock itch).     XTANDI 40 MG capsule Take 160 mg by mouth daily.      Allergies as of 12/16/2022 - Review Complete 12/16/2022  Allergen Reaction Noted   Ezetimibe  Other (See Comments) 07/24/2020   Statins Other (See Comments) 09/07/2015    Family History  Problem Relation Age of Onset   Heart disease Mother    Heart disease Father    Cancer Sister        lung   Cancer Brother        prostate    Social History   Socioeconomic History   Marital status: Married    Spouse name: geraldine   Number of children: 3   Years of education: some college   Highest education level: High school graduate  Occupational History   Occupation: retired  Tobacco Use   Smoking status: Former    Current packs/day: 0.00    Average packs/day: 1 pack/day for 30.0 years (30.0 ttl pk-yrs)    Types: Cigarettes    Start date: 12/16/1967    Quit date: 12/15/1997    Years since quitting: 25.0   Smokeless tobacco: Never  Vaping Use   Vaping status: Never Used  Substance and Sexual Activity   Alcohol use: Not Currently    Alcohol/week: 20.0 standard drinks of alcohol    Types: 20 Shots of liquor per week    Comment: OCCASIONAL, 12/28/17 stopped 11/05/17   Drug use: No   Sexual activity: Not on file  Other Topics Concern   Not on file  Social History Narrative   Lives with wife at home    caffeine use- occas, mostly decaf coffee   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    Social Determinants of Health   Financial Resource Strain: Low Risk  (11/18/2022)   Overall Financial Resource Strain (CARDIA)    Difficulty of Paying Living Expenses: Not hard at all  Food Insecurity: No Food Insecurity (12/01/2022)   Hunger Vital Sign    Worried About Running Out of Food in the Last Year: Never true    Ran Out of Food in the Last Year: Never true  Transportation Needs: No Transportation Needs (12/01/2022)   PRAPARE - Administrator, Civil Service (Medical): No    Lack of Transportation (Non-Medical): No  Physical Activity: Not on file  Stress: Not on file  Social Connections: Not on file  Intimate Partner Violence: Not At Risk (12/01/2022)   Humiliation, Afraid, Rape, and Kick  questionnaire    Fear of Current or Ex-Partner: No    Emotionally Abused: No    Physically Abused: No  Sexually Abused: No    Review of Systems: As per HPI.  Physical Exam: Vital signs in last 24 hours: Temp:  [97.6 F (36.4 C)-98 F (36.7 C)] 97.8 F (36.6 C) (10/09 0741) Pulse Rate:  [55-83] 60 (10/09 0741) Resp:  [12-27] 17 (10/09 0741) BP: (104-179)/(66-95) 149/95 (10/09 0741) SpO2:  [92 %-100 %] 92 % (10/09 0825) Weight:  [93.9 kg] 93.9 kg (10/08 1809)    General:   Alert,  Well-developed, well-nourished, pleasant and cooperative in NAD Head:  Normocephalic and atraumatic. Eyes:  Sclera clear, no icterus.   Mild pallor Ears:  Normal auditory acuity. Nose:  No deformity, discharge,  or lesions. Mouth:  No deformity or lesions.  Oropharynx pink & moist. Neck:  Supple; no masses or thyromegaly. Lungs:  Clear throughout to auscultation.   No wheezes, crackles, or rhonchi. No acute distress. Heart:  Regular rate and rhythm; no murmurs, clicks, rubs,  or gallops. Extremities:  Without clubbing or edema. Neurologic:  Alert and  oriented x4;  grossly normal neurologically. Skin:  Intact without significant lesions or rashes. Psych:  Alert and cooperative. Normal mood and affect. Abdomen:  Soft, nontender and nondistended. No masses, hepatosplenomegaly or hernias noted. Normal bowel sounds, without guarding, and without rebound.         Lab Results: Recent Labs    12/16/22 1825 12/17/22 0419  WBC 6.3 6.4  HGB 6.7* 7.4*  HCT 20.6* 22.3*  PLT 239 211   BMET Recent Labs    12/16/22 1825 12/17/22 0419  NA 135 137  K 4.0 3.7  CL 103 107  CO2 20* 21*  GLUCOSE 107* 134*  BUN 41* 34*  CREATININE 1.74* 1.62*  CALCIUM 9.5 9.6   LFT Recent Labs    12/17/22 0419  PROT 6.2*  ALBUMIN 2.9*  AST 16  ALT 13  ALKPHOS 61  BILITOT 0.6   PT/INR No results for input(s): "LABPROT", "INR" in the last 72 hours.  Studies/Results: CT ABDOMEN PELVIS W  CONTRAST  Result Date: 12/16/2022 CLINICAL DATA:  Abdomen pain anemia rectal bleeding, history of prostate cancer EXAM: CT ABDOMEN AND PELVIS WITH CONTRAST TECHNIQUE: Multidetector CT imaging of the abdomen and pelvis was performed using the standard protocol following bolus administration of intravenous contrast. RADIATION DOSE REDUCTION: This exam was performed according to the departmental dose-optimization program which includes automated exposure control, adjustment of the mA and/or kV according to patient size and/or use of iterative reconstruction technique. CONTRAST:  60mL OMNIPAQUE IOHEXOL 350 MG/ML SOLN COMPARISON:  PET CT 05/09/2022 FINDINGS: Lower chest: Lung bases demonstrate no acute airspace disease. Mild coronary vascular calcification Hepatobiliary: No focal hepatic abnormality or biliary dilatation. Small gallstones. Pancreas: Unremarkable. No pancreatic ductal dilatation or surrounding inflammatory changes. Spleen: Normal in size without focal abnormality. Adrenals/Urinary Tract: Adrenal glands are unremarkable. Kidneys are normal, without renal calculi, focal lesion, or hydronephrosis. Bladder is unremarkable. Stomach/Bowel: Stomach is within normal limits. Appendix appears normal. No evidence of bowel wall thickening, distention, or inflammatory changes. Diverticular disease of the left colon Vascular/Lymphatic: Moderate aortic atherosclerosis. No aneurysm. No suspicious lymph nodes Reproductive: Fiducial markers in the prostate. Penile prosthesis with reservoir in the right anterior pelvis indenting the bladder Other: Negative for pelvic effusion or free air Musculoskeletal: No acute osseous abnormality. Sclerotic lesion involving the left pedicle and posterior arch at L4 consistent with metastatic disease. IMPRESSION: 1. No CT evidence for acute intra-abdominal or pelvic abnormality. 2. Diverticular disease of the left colon without acute inflammatory process. 3. Cholelithiasis.  4. Sclerotic  lesion at L4 consistent with metastatic disease. 5. Aortic atherosclerosis. Aortic Atherosclerosis (ICD10-I70.0). Electronically Signed   By: Jasmine Pang M.D.   On: 12/16/2022 21:47    Impression: Dark, melanotic stools On aspirin and Plavix  Hemoglobin 6.7 on presentation, 1 unit PRBC transfusion given, hemoglobin increased to 7.4 BUN/creatinine/GFR 34/1.62/43 CT abdomen pelvis with contrast 12/16/2022: Normal liver, normal pancreas, small gallstones, normal stomach, no bowel wall thickening, diverticulosis of left colon  Plan: Unclear etiology of anemia and dark stools. Will proceed with EGD and possible small bowel PillCam deployment in duodenum tomorrow. 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 Continue pantoprazole 40 mg twice a day.   LOS: 1 day   Kerin Salen, MD  12/17/2022, 8:51 AM

## 2022-12-17 NOTE — Assessment & Plan Note (Signed)
Stable.  On Repatha.  Zetia causes muscle pain and statins caused joint pain.

## 2022-12-17 NOTE — Consult Note (Signed)
[facility] Consult  Referring Provider: ER Primary Care Physician:  [name], MD Primary Gastroenterologist: Dr. Bosie Clos  Reason for Consultation: Anemia and dark stools  HPI: Ricky Rowland. is a 77 y.o. male states that he has been having dark-colored stools and denies use of iron. He denies noticing bright red blood in stool. Denies unintentional weight loss or loss of appetite. Denies acid reflux, heartburn, difficulty swallowing, pain on swallowing, bloating or change in bowel habits.  He recently had an endoscopy on 12/02/2022 for suspected melena:gastritis and nodule in antrum.  Recently had stent placement on 11/16/2022 to LAD for CAD, dual antiplatelet therapy with Plavix and aspirin started on 12/02/2022(was previously on Effient/Prasugrel)  He has history of carotid stenosis and CVA,monoclonal gammopathy of undetermined significance, prostate cancer status post radiation and on Xtandi.  Patient states he has not noted bright red blood in stool.  He states stools are unusually dark sometimes even black.  Previous GI workup: EGD 12/02/2022 Colonoscopy 10/22, Dr. Bosie Clos: Diverticulosis of entire colon, tattoo):, Scar noted ectomy: Internal hemorrhoids, repeat recommended in 5 years Colonoscopy 07/2017, Dr. Bosie Clos: 4 tubular adenomas removed, diverticulosis EGD 07/2017, Dr. Bosie Clos: Barrett's esophagus, repeat recommended in 3 years  Past Medical History:  Diagnosis Date   Acute kidney injury superimposed on chronic kidney disease (HCC) 11/19/2022   Borderline diabetes    DIET CONTROLLED   Bradycardia 11/17/2022   Dyslipidemia    Elevated PSA    External carotid artery stenosis    MILD-- BILATERAL   Fall 11/05/2017   GERD (gastroesophageal reflux disease)    Gout, arthritis    Hearing loss in left ear    Heart murmur    MILD --  ASYMPTOMATIC   History of gastric ulcer    REMOTE HX   Hypertension    Pre-diabetes    Prostate cancer (HCC)  12/21/2012   Gleason 3+4=7   Seizure-like activity (HCC)    Syncope and collapse    Wears glasses     Past Surgical History:  Procedure Laterality Date   CORONARY STENT INTERVENTION N/A 11/16/2022   Procedure: CORONARY STENT INTERVENTION;  Surgeon: Elder Negus, MD;  Location: [facility];  Service: Cardiovascular;  Laterality: N/A;   CORONARY ULTRASOUND/IVUS N/A 11/16/2022   Procedure: Coronary Ultrasound/IVUS;  Surgeon: Elder Negus, MD;  Location: [facility];  Service: Cardiovascular;  Laterality: N/A;   ESOPHAGOGASTRODUODENOSCOPY (EGD) WITH PROPOFOL N/A 12/02/2022   Procedure: ESOPHAGOGASTRODUODENOSCOPY (EGD) WITH PROPOFOL;  Surgeon: Charlott Rakes, MD;  Location[facility] END[facility]COPY;  Service: Gastroenterology;  Laterality: N/A;   KNEE ARTHROSCOPY W/ MENISCAL REPAIR Left 03/10/2012   LEFT HEART CATH AND CORONARY ANGIOGRAPHY N/A 11/16/2022   Procedure: LEFT HEART CATH AND CORONARY ANGIOGRAPHY;  Surgeon: Elder Negus, MD;  Location: [facility];  Service: Cardiovascular;  Laterality: N/A;   LOOP RECORDER INSERTION N/A 01/05/2018   Procedure: LOOP RECORDER INSERTION;  Surgeon: Duke Salvia, MD;  Location: [facility] INVASIVE CV LAB;  Service: Cardiovascular;  Laterality: N/A;   LUMBAR SPINE SURGERY  03/10/1989   PROSTATE BIOPSY N/A 12/21/2012   Procedure: BIOPSY TRANSRECTAL ULTRASONIC PROSTATE (TUBP);  Surgeon: Lindaann Slough, MD;  Location: [facility];  Service: Urology;  Laterality: N/A;   PROSTATE BIOPSY  11/26/2011   benign   TEMPORARY PACEMAKER N/A 11/16/2022   Procedure: TEMPORARY PACEMAKER;  Surgeon: Elder Negus, MD;  Location: [facility];  Service: Cardiovascular;  Laterality: N/A;   TONSILLECTOMY  WISDOM TOOTH EXTRACTION      Prior to Admission medications   Medication Sig Start Date End Date Taking? Authorizing Provider  acetaminophen (TYLENOL) 650 MG CR tablet Take 650 mg by mouth every 8 (eight) hours as  needed for pain.   Yes [provider]  allopurinol (ZYLOPRIM) 300 MG tablet Take 300 mg by mouth daily.   Yes [provider]  amLODipine (NORVASC) 10 MG tablet Take 1 tablet (10 mg total) by mouth daily. 12/05/22  Yes Leroy Sea, MD  aspirin EC 81 MG tablet Take 1 tablet (81 mg total) by mouth daily. Swallow whole. 11/19/22  Yes Dunn, Dayna N, PA-C  clopidogrel (PLAVIX) 75 MG tablet Take 1 tablet (75 mg total) by mouth daily. 12/05/22  Yes Leroy Sea, MD  docusate sodium (COLACE) 100 MG capsule Take 2 capsules (200 mg total) by mouth daily as needed for mild constipation. 12/05/22  Yes Leroy Sea, MD  Multiple Vitamins-Minerals (MULTIVITAMIN ADULT EXTRA C PO) Take 1 tablet by mouth daily. 07/24/22  Yes [provider]  Naphazoline HCl (CLEAR EYES OP) Place 1 drop into both eyes daily as needed (dry eyes).    Yes [provider]  nitroGLYCERIN (NITROSTAT) 0.4 MG SL tablet Place 1 tablet (0.4 mg total) under the tongue every 5 (five) minutes as needed for chest pain (up to 3 doses). 11/19/22  Yes Dunn, Dayna N, PA-C  ORGOVYX 120 MG tablet Take 120 mg by mouth daily. 05/19/22  Yes [provider]  pantoprazole (PROTONIX) 40 MG tablet Take 1 tablet (40 mg total) by mouth 2 (two) times daily before a meal. 12/05/22  Yes Leroy Sea, MD  polyethylene glycol powder (GLYCOLAX/MIRALAX) 17 GM/SCOOP powder Take 1 capful (17 g) with water by mouth daily as needed for mild constipation. 12/05/22  Yes Leroy Sea, MD  REPATHA SURECLICK 140 MG/ML SOAJ Inject 140 mg into the skin every 14 (fourteen) days.  06/07/17  Yes [provider]  tolnaftate (TINACTIN) 1 % cream Apply 1 application topically daily as needed (jock itch).   Yes [provider]  XTANDI 40 MG capsule Take 160 mg by mouth daily.   Yes [provider]    Current Facility-Administered Medications  Medication Dose Route Frequency Provider Last Rate Last  Admin   nitroGLYCERIN (NITROSTAT) SL tablet 0.4 mg  0.4 mg Sublingual Q5 min PRN Earlie Lou L, MD   0.4 mg at 12/16/22 2201   pantoprazole (PROTONIX) injection 40 mg  40 mg Intravenous Q12H Rometta Emery, MD   40 mg at 12/17/22 0400   Current Outpatient Medications  Medication Sig Dispense Refill   acetaminophen (TYLENOL) 650 MG CR tablet Take 650 mg by mouth every 8 (eight) hours as needed for pain.     allopurinol (ZYLOPRIM) 300 MG tablet Take 300 mg by mouth daily.     amLODipine (NORVASC) 10 MG tablet Take 1 tablet (10 mg total) by mouth daily. 30 tablet 0   aspirin EC 81 MG tablet Take 1 tablet (81 mg total) by mouth daily. Swallow whole. 120 tablet 2   clopidogrel (PLAVIX) 75 MG tablet Take 1 tablet (75 mg total) by mouth daily. 30 tablet 0   docusate sodium (COLACE) 100 MG capsule Take 2 capsules (200 mg total) by mouth daily as needed for mild constipation. 100 capsule 0   Multiple Vitamins-Minerals (MULTIVITAMIN ADULT EXTRA C PO) Take 1 tablet by mouth daily.     Naphazoline HCl (CLEAR  EYES OP) Place 1 drop into both eyes daily as needed (dry eyes).      nitroGLYCERIN (NITROSTAT) 0.4 MG SL tablet Place 1 tablet (0.4 mg total) under the tongue every 5 (five) minutes as needed for chest pain (up to 3 doses). 25 tablet 3   ORGOVYX 120 MG tablet Take 120 mg by mouth daily.     pantoprazole (PROTONIX) 40 MG tablet Take 1 tablet (40 mg total) by mouth 2 (two) times daily before a meal. 60 tablet 1   polyethylene glycol powder (GLYCOLAX/MIRALAX) 17 GM/SCOOP powder Take 1 capful (17 g) with water by mouth daily as needed for mild constipation. 238 g 0   REPATHA SURECLICK 140 MG/ML SOAJ Inject 140 mg into the skin every 14 (fourteen) days.      tolnaftate (TINACTIN) 1 % cream Apply 1 application topically daily as needed (jock itch).     XTANDI 40 MG capsule Take 160 mg by mouth daily.      Allergies as of 12/16/2022 - Review Complete 12/16/2022  Allergen Reaction Noted   Ezetimibe  Other (See Comments) 07/24/2020   Statins Other (See Comments) 09/07/2015    Family History  Problem Relation Age of Onset   Heart disease Mother    Heart disease Father    Cancer Sister        lung   Cancer Brother        prostate    Social History   Socioeconomic History   Marital status: Married    Spouse name: geraldine   Number of children: 3   Years of education: some college   Highest education level: High school graduate  Occupational History   Occupation: retired  Tobacco Use   Smoking status: Former    Current packs/day: 0.00    Average packs/day: 1 pack/day for 30.0 years (30.0 ttl pk-yrs)    Types: Cigarettes    Start date: 12/16/1967    Quit date: 12/15/1997    Years since quitting: 25.0   Smokeless tobacco: Never  Vaping Use   Vaping status: Never Used  Substance and Sexual Activity   Alcohol use: Not Currently    Alcohol/week: 20.0 standard drinks of alcohol    Types: 20 Shots of liquor per week    Comment: OCCASIONAL, 12/28/17 stopped 11/05/17   Drug use: No   Sexual activity: Not on file  Other Topics Concern   Not on file  Social History Narrative   Lives with wife at home    caffeine use- occas, mostly decaf coffee   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    Social Determinants of Health   Financial Resource Strain: Low Risk  (11/18/2022)   Overall Financial Resource Strain (CARDIA)    Difficulty of Paying Living Expenses: Not hard at all  Food Insecurity: No Food Insecurity (12/01/2022)   Hunger Vital Sign    Worried About Running Out of Food in the Last Year: Never true    Ran Out of Food in the Last Year: Never true  Transportation Needs: No Transportation Needs (12/01/2022)   PRAPARE - Administrator, Civil Service (Medical): No    Lack of Transportation (Non-Medical): No  Physical Activity: Not on file  Stress: Not on file  Social Connections: Not on file  Intimate Partner Violence: Not At Risk (12/01/2022)   Humiliation, Afraid, Rape, and Kick  questionnaire    Fear of Current or Ex-Partner: No    Emotionally Abused: No    Physically Abused: No  Sexually Abused: No    Review of Systems: As per HPI.  Physical Exam: Vital signs in last 24 hours: Temp:  [97.6 F (36.4 C)-98 F (36.7 C)] 97.8 F (36.6 C) (10/09 0741) Pulse Rate:  [55-83] 60 (10/09 0741) Resp:  [12-27] 17 (10/09 0741) BP: (104-179)/(66-95) 149/95 (10/09 0741) SpO2:  [92 %-100 %] 92 % (10/09 0825) Weight:  [93.9 kg] 93.9 kg (10/08 1809)    General:   Alert,  Well-developed, well-nourished, pleasant and cooperative in NAD Head:  Normocephalic and atraumatic. Eyes:  Sclera clear, no icterus.   Mild pallor Ears:  Normal auditory acuity. Nose:  No deformity, discharge,  or lesions. Mouth:  No deformity or lesions.  Oropharynx pink & moist. Neck:  Supple; no masses or thyromegaly. Lungs:  Clear throughout to auscultation.   No wheezes, crackles, or rhonchi. No acute distress. Heart:  Regular rate and rhythm; no murmurs, clicks, rubs,  or gallops. Extremities:  Without clubbing or edema. Neurologic:  Alert and  oriented x4;  grossly normal neurologically. Skin:  Intact without significant lesions or rashes. Psych:  Alert and cooperative. Normal mood and affect. Abdomen:  Soft, nontender and nondistended. No masses, hepatosplenomegaly or hernias noted. Normal bowel sounds, without guarding, and without rebound.         Lab Results: Recent Labs    12/16/22 1825 12/17/22 0419  WBC 6.3 6.4  HGB 6.7* 7.4*  HCT 20.6* 22.3*  PLT 239 211   BMET Recent Labs    12/16/22 1825 12/17/22 0419  NA 135 137  K 4.0 3.7  CL 103 107  CO2 20* 21*  GLUCOSE 107* 134*  BUN 41* 34*  CREATININE 1.74* 1.62*  CALCIUM 9.5 9.6   LFT Recent Labs    12/17/22 0419  PROT 6.2*  ALBUMIN 2.9*  AST 16  ALT 13  ALKPHOS 61  BILITOT 0.6   PT/INR No results for input(s): "LABPROT", "INR" in the last 72 hours.  Studies/Results: CT ABDOMEN PELVIS W  CONTRAST  Result Date: 12/16/2022 CLINICAL DATA:  Abdomen pain anemia rectal bleeding, history of prostate cancer EXAM: CT ABDOMEN AND PELVIS WITH CONTRAST TECHNIQUE: Multidetector CT imaging of the abdomen and pelvis was performed using the standard protocol following bolus administration of intravenous contrast. RADIATION DOSE REDUCTION: This exam was performed according to the departmental dose-optimization program which includes automated exposure control, adjustment of the mA and/or kV according to patient size and/or use of iterative reconstruction technique. CONTRAST:  60mL OMNIPAQUE IOHEXOL 350 MG/ML SOLN COMPARISON:  PET CT 05/09/2022 FINDINGS: Lower chest: Lung bases demonstrate no acute airspace disease. Mild coronary vascular calcification Hepatobiliary: No focal hepatic abnormality or biliary dilatation. Small gallstones. Pancreas: Unremarkable. No pancreatic ductal dilatation or surrounding inflammatory changes. Spleen: Normal in size without focal abnormality. Adrenals/Urinary Tract: Adrenal glands are unremarkable. Kidneys are normal, without renal calculi, focal lesion, or hydronephrosis. Bladder is unremarkable. Stomach/Bowel: Stomach is within normal limits. Appendix appears normal. No evidence of bowel wall thickening, distention, or inflammatory changes. Diverticular disease of the left colon Vascular/Lymphatic: Moderate aortic atherosclerosis. No aneurysm. No suspicious lymph nodes Reproductive: Fiducial markers in the prostate. Penile prosthesis with reservoir in the right anterior pelvis indenting the bladder Other: Negative for pelvic effusion or free air Musculoskeletal: No acute osseous abnormality. Sclerotic lesion involving the left pedicle and posterior arch at L4 consistent with metastatic disease. IMPRESSION: 1. No CT evidence for acute intra-abdominal or pelvic abnormality. 2. Diverticular disease of the left colon without acute inflammatory process. 3. Cholelithiasis.  4. Sclerotic  lesion at L4 consistent with metastatic disease. 5. Aortic atherosclerosis. Aortic Atherosclerosis (ICD10-I70.0). Electronically Signed   By: Jasmine Pang M.D.   On: 12/16/2022 21:47    Impression: Dark, melanotic stools On aspirin and Plavix  Hemoglobin 6.7 on presentation, 1 unit PRBC transfusion given, hemoglobin increased to 7.4 BUN/creatinine/GFR 34/1.62/43 CT abdomen pelvis with contrast 12/16/2022: Normal liver, normal pancreas, small gallstones, normal stomach, no bowel wall thickening, diverticulosis of left colon  Plan: Unclear etiology of anemia and dark stools. Will proceed with EGD and possible small bowel PillCam deployment in duodenum tomorrow. 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 Continue pantoprazole 40 mg twice a day.   LOS: 1 day   Kerin Salen, MD  12/17/2022, 8:51 AM

## 2022-12-17 NOTE — Assessment & Plan Note (Signed)
Stable.  Baseline creatinine 1.5-1.7.

## 2022-12-17 NOTE — Assessment & Plan Note (Signed)
Stable.  Has already had XRT to his bony mets in his spine.

## 2022-12-17 NOTE — Assessment & Plan Note (Addendum)
Admitted for recurrent GI bleeding.  GI was consulted.  transfused with 1 unit of blood on 12-17-2022.  Placed on Protonix 40 mg IV twice daily.  Given that he just had a drug-eluting stent placed less than a month ago, he really needs to be on dual antiplatelet therapy.    12-18-2022. Had another negative EGD for source of bleeding. VCE performed. Awaiting results for pill enteroscopy. May need colonoscopy. 12-19-2022 awaiting VCE report. HgB stable. Pt may shower. Tolerating solid food. 12-20-2022 no further bleeding. Pt has orthostasis. Will give 1 unit PRBC. Pt already has hold orders on norvasc. Discussed with pt and dtr that if VCE negative for source of bleeding, then next step would probably be colonoscopy.  If that fails to show source of bleeding, then only option would be to restart Plavix while pt remains in the hospital and monitor HgB. Discussed repeatedly that frequent blood draws are a source of blood loss and anemia. 12-21-2022 VCE showed small bowel AVMs. 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  12-22-2022 HgB stable at 9.1. did receive 1 unit PRBC on 12-20-2022. Will dc today. Restart plavix on Thursday October 17. Will need repeat CBC or Hgb/Hct on Friday October 17, then October 21 and then October 28 to ensure he is not bleeding after starting plavix. Pt to f/u with [facility]. He if bleeds again, the only thing that has not been performed is colonoscopy because his description of his GI bleeding has always been melena and not bright red blood per rectum.  Patient also need to follow-up with cardiology for his PCI.  Cardiology saw the patient during his hospitalization and deemed him stable to only be treated with Plavix alone.  Will need follow-up with cardiology to decide how long he Cervene on Plavix alone.  His aspirin was stopped by cardiology.

## 2022-12-17 NOTE — Progress Notes (Signed)
PROGRESS NOTE    Ricky Rowland  QMV:[id number] DOB: 18-Jul-1945 DOA: 12/16/2022 PCP: [name], MD  Subjective: Patient seen and examined.  Has had recurrent melanotic stools.  Just had a heart attack at the beginning of September 2028.  Had a left heart cath.  Required stenting.  Has a drug-eluting stent in the mid LAD.  Was placed on dual antiplatelet.  He was discharged from the hospital on December 05, 2022.  At that time he was discharged to home on aspirin and Plavix.  His Brilinta had been stopped.  Presents now back with GI bleeding.  Received 1 unit of blood.  She has been consulted.  Has not had a colonoscopy for at least 2 years.  Already had an EGD done last month that showed some mild gastritis in the antrum.   Hospital Course: HPI: Ricky Rowland. is a 77 y.o. male with medical history significant of recent GI bleed with workup performed on 924 with EGD who is on dual antiplatelet therapy at home, history of coronary artery disease status post recent PCI again in September of this year.  Patient has been on aspirin and Effient afterwards, hypertension, hyperlipidemia, MGUS, chronic kidney disease stage IIIb and hyperlipidemia who presented to the ER today with recurrent GI bleed.  Patient was evaluated previously for the GI bleed after he was on dual antiplatelet therapy as a result of his coronary artery disease.  The EGD that he had done did not show any source of bleeding.  His H&H was stable at that time and was placed back on his dual antiplatelet therapy.  Patient however is back here again with another episode of melena with subsequent drop in his hemoglobin.  At time of discharge hemoglobin was around 9.9 g.  It is now dropped to 6.7.  Patient reported seeing the melena again.  At this time therefore is being admitted for management of GI bleed.  Denied any nausea or vomiting.  Denied any bright red blood per rectum but mainly the melena.  Patient however is having  some weakness and lightheadedness.  He is being admitted for further evaluation and treatment.   Significant Events: Admitted 12/16/2022   Significant Labs: Admitting HgB 6.7 g/dl  Significant Imaging Studies: Admitting CT abd shows 1. No CT evidence for acute intra-abdominal or pelvic abnormality. 2. Diverticular disease of the left colon without acute inflammatory process. 3. Cholelithiasis. 4. Sclerotic lesion at L4 consistent with metastatic disease. 5. Aortic atherosclerosis.  Antibiotic Therapy: Anti-infectives (From admission, onward)    None       Procedures: 1 unit PRBC transfused 12-17-2022  Consultants: GI - [facility]    Assessment and Plan: * GI bleed Admitted for recurrent GI bleeding.  GI was consulted.  Already transfused with 1 unit of blood.  Placed on Protonix 40 mg IV twice daily.  Given that he just had a drug-eluting stent placed less than a month ago, he really needs to be on dual antiplatelet therapy.  May need colonoscopy and/or video capsule endoscopy as well.  Defer to GI to make this decision.  Symptomatic anemia Due to GI bleeding.  Status post 1 unit of packed cell transfusion.  Hemoglobin up to 7.4 today.  Was 6.7 yesterday prior to transfusion.  Ischemic cardiomyopathy Last EF of 40 to 45% in September 2024.  Not volume overloaded.  Essential hypertension Will restart his blood pressure medications at lower doses.  And monitor.  Dyslipidemia Stable.  On Repatha.  Zetia causes muscle pain and statins caused joint pain.  Malignant neoplasm of prostate (HCC) Stable.  Has already had XRT to his bony mets in his spine.  CKD stage 3a, GFR 45-59 ml/min (HCC) - baseline SCr 1.5-1.7 Stable.  Baseline creatinine 1.5-1.7.   DVT prophylaxis: SCDs Start: 12/17/22 0314    Code Status: Full Code Family Communication: discussed with pt and dtr at bedside Disposition Plan: return home Reason for continuing need for hospitalization: needs GI consult. May  need colonoscopy  Objective: Vitals:   12/17/22 0330 12/17/22 0430 12/17/22 0500 12/17/22 0741  BP: (!) 179/86 (!) 141/69 (!) 144/66 (!) 149/95  Pulse: 62 62 (!) 55 60  Resp: 15 12 14 17   Temp:    97.8 F (36.6 C)  TempSrc:    Oral  SpO2: 100% 100% 100% 100%  Weight:      Height:        Intake/Output Summary (Last 24 hours) at 12/17/2022 0824 Last data filed at 12/17/2022 0031 Gross per 24 hour  Intake 334 ml  Output --  Net 334 ml   Filed Weights   12/16/22 1809  Weight: 93.9 kg    Examination:  Physical Exam Vitals and nursing note reviewed.  Constitutional:      General: He is not in acute distress.    Appearance: He is normal weight. He is not ill-appearing, toxic-appearing or diaphoretic.  HENT:     Head: Normocephalic and atraumatic.     Nose: Nose normal.  Eyes:     General: No scleral icterus. Cardiovascular:     Rate and Rhythm: Normal rate and regular rhythm.     Pulses: Normal pulses.  Pulmonary:     Effort: Pulmonary effort is normal.  Abdominal:     General: Bowel sounds are normal. There is no distension.     Palpations: Abdomen is soft.     Tenderness: There is no abdominal tenderness.     Comments: Round abdomen.  Musculoskeletal:     Right lower leg: No edema.     Left lower leg: No edema.  Skin:    General: Skin is warm and dry.     Capillary Refill: Capillary refill takes less than 2 seconds.  Neurological:     General: No focal deficit present.     Mental Status: He is alert and oriented to person, place, and time.     Data Reviewed: I have personally reviewed following labs and imaging studies  CBC: Recent Labs  Lab 12/16/22 1825 12/17/22 0419  WBC 6.3 6.4  HGB 6.7* 7.4*  HCT 20.6* 22.3*  MCV 97.6 93.3  PLT 239 211   Basic Metabolic Panel: Recent Labs  Lab 12/16/22 1825 12/17/22 0419  NA 135 137  K 4.0 3.7  CL 103 107  CO2 20* 21*  GLUCOSE 107* 134*  BUN 41* 34*  CREATININE 1.74* 1.62*  CALCIUM 9.5 9.6    GFR: Estimated Creatinine Clearance: 44.2 mL/min (A) (by C-G formula based on SCr of 1.62 mg/dL (H)). Liver Function Tests: Recent Labs  Lab 12/16/22 1825 12/17/22 0419  AST 18 16  ALT 14 13  ALKPHOS 70 61  BILITOT 0.4 0.6  PROT 6.8 6.2*  ALBUMIN 3.3* 2.9*    Radiology Studies: CT ABDOMEN PELVIS W CONTRAST  Result Date: 12/16/2022 CLINICAL DATA:  Abdomen pain anemia rectal bleeding, history of prostate cancer EXAM: CT ABDOMEN AND PELVIS WITH CONTRAST TECHNIQUE: Multidetector CT imaging of the abdomen and pelvis was performed using the  standard protocol following bolus administration of intravenous contrast. RADIATION DOSE REDUCTION: This exam was performed according to the departmental dose-optimization program which includes automated exposure control, adjustment of the mA and/or kV according to patient size and/or use of iterative reconstruction technique. CONTRAST:  60mL OMNIPAQUE IOHEXOL 350 MG/ML SOLN COMPARISON:  PET CT 05/09/2022 FINDINGS: Lower chest: Lung bases demonstrate no acute airspace disease. Mild coronary vascular calcification Hepatobiliary: No focal hepatic abnormality or biliary dilatation. Small gallstones. Pancreas: Unremarkable. No pancreatic ductal dilatation or surrounding inflammatory changes. Spleen: Normal in size without focal abnormality. Adrenals/Urinary Tract: Adrenal glands are unremarkable. Kidneys are normal, without renal calculi, focal lesion, or hydronephrosis. Bladder is unremarkable. Stomach/Bowel: Stomach is within normal limits. Appendix appears normal. No evidence of bowel wall thickening, distention, or inflammatory changes. Diverticular disease of the left colon Vascular/Lymphatic: Moderate aortic atherosclerosis. No aneurysm. No suspicious lymph nodes Reproductive: Fiducial markers in the prostate. Penile prosthesis with reservoir in the right anterior pelvis indenting the bladder Other: Negative for pelvic effusion or free air Musculoskeletal: No  acute osseous abnormality. Sclerotic lesion involving the left pedicle and posterior arch at L4 consistent with metastatic disease. IMPRESSION: 1. No CT evidence for acute intra-abdominal or pelvic abnormality. 2. Diverticular disease of the left colon without acute inflammatory process. 3. Cholelithiasis. 4. Sclerotic lesion at L4 consistent with metastatic disease. 5. Aortic atherosclerosis. Aortic Atherosclerosis (ICD10-I70.0). Electronically Signed   By: Jasmine Pang M.D.   On: 12/16/2022 21:47    Scheduled Meds:  pantoprazole (PROTONIX) IV  40 mg Intravenous Q12H   Continuous Infusions:   LOS: 1 day   Time spent: 40 minutes  Carollee Herter, DO  [facility]  12/17/2022, 8:24 AM

## 2022-12-17 NOTE — Plan of Care (Signed)
  Problem: Activity: Goal: Ability to tolerate increased activity will improve Outcome: Progressing   Problem: Cardiac: Goal: Ability to achieve and maintain adequate cardiovascular perfusion will improve Outcome: Progressing   Problem: Health Behavior/Discharge Planning: Goal: Ability to safely manage health-related needs after discharge will improve Outcome: Progressing   Problem: Activity: Goal: Ability to return to baseline activity level will improve Outcome: Progressing   Problem: Cardiovascular: Goal: Ability to achieve and maintain adequate cardiovascular perfusion will improve Outcome: Progressing   Problem: Education: Goal: Knowledge of General Education information will improve Description: Including pain rating scale, medication(s)/side effects and non-pharmacologic comfort measures Outcome: Progressing   Problem: Health Behavior/Discharge Planning: Goal: Ability to manage health-related needs will improve Outcome: Progressing   Problem: Clinical Measurements: Goal: Will remain free from infection Outcome: Progressing   Problem: Clinical Measurements: Goal: Ability to maintain clinical measurements within normal limits will improve Outcome: Progressing   Problem: Coping: Goal: Level of anxiety will decrease Outcome: Progressing   Problem: Pain Managment: Goal: General experience of comfort will improve Outcome: Progressing

## 2022-12-17 NOTE — Telephone Encounter (Signed)
Patient is calling because he has a Zio monitor on and is having issues with it. Patient is supposed to turn it in on 10/11, but would like to speak with Andee Lineman in regards to this. Please advise.

## 2022-12-17 NOTE — Telephone Encounter (Signed)
Patient called because he did not think his monitor was working because it wasn't flashing any lights.  Explained to Ricky Rowland, the device would only flash lights when it is initially turned on or, when it is "not" working.   He is currently in the ER with gastrointestinal hemorrahage .  Explained to patient he is wearing a mobile cardiac "outpatient" telemetry monitor.  If he is admitted, he may need to remove the monitor 1-2 days prematurely.  Explained to remove ZIO patch , place the patch in the gateway, put the gateway with the patch inside, into the white envelope in the bottom of the gateway.  The envelope already has a prepaid return USPS postage label on it.  Seal the envelope and put in mail box or take to post office.

## 2022-12-17 NOTE — Subjective & Objective (Addendum)
Patient seen and examined. Pt completed his video capsule endoscopy. Awaiting GI report.  HgB stable. On RA. No chest pain  Pt was dizzy while taking a shower today. Pt noted to be orthostatic this AM with BP dropping to 86/53 while standing.  105/56 while sitting.  HgB stable at 7.7. was 7.9 yesterday. No further black stools  Pt still feels tired and fatigue.  2 Dtrs at bedside.

## 2022-12-18 ENCOUNTER — Inpatient Hospital Stay (HOSPITAL_COMMUNITY): Payer: Medicare Other | Admitting: Registered Nurse

## 2022-12-18 ENCOUNTER — Encounter (HOSPITAL_COMMUNITY): Payer: Self-pay | Admitting: Internal Medicine

## 2022-12-18 ENCOUNTER — Encounter (HOSPITAL_COMMUNITY)
Admission: EM | Disposition: A | Discharge status: Home / Self Care | Payer: Self-pay | Source: Home / Self Care | Attending: Internal Medicine

## 2022-12-18 DIAGNOSIS — I2510 Atherosclerotic heart disease of native coronary artery without angina pectoris: Secondary | ICD-10-CM | POA: Diagnosis not present

## 2022-12-18 DIAGNOSIS — K449 Diaphragmatic hernia without obstruction or gangrene: Secondary | ICD-10-CM

## 2022-12-18 DIAGNOSIS — D649 Anemia, unspecified: Secondary | ICD-10-CM | POA: Diagnosis not present

## 2022-12-18 DIAGNOSIS — Z955 Presence of coronary angioplasty implant and graft: Secondary | ICD-10-CM | POA: Diagnosis not present

## 2022-12-18 DIAGNOSIS — E785 Hyperlipidemia, unspecified: Secondary | ICD-10-CM | POA: Diagnosis not present

## 2022-12-18 DIAGNOSIS — K922 Gastrointestinal hemorrhage, unspecified: Secondary | ICD-10-CM | POA: Diagnosis not present

## 2022-12-18 DIAGNOSIS — D509 Iron deficiency anemia, unspecified: Secondary | ICD-10-CM

## 2022-12-18 DIAGNOSIS — I10 Essential (primary) hypertension: Secondary | ICD-10-CM | POA: Diagnosis not present

## 2022-12-18 HISTORY — PX: ESOPHAGOGASTRODUODENOSCOPY (EGD) WITH PROPOFOL: SHX5813

## 2022-12-18 HISTORY — PX: GIVENS CAPSULE STUDY: SHX5432

## 2022-12-18 LAB — COMPREHENSIVE METABOLIC PANEL
ALT: 13 U/L (ref 0–44)
AST: 20 U/L (ref 15–41)
Albumin: 3 g/dL — ABNORMAL LOW (ref 3.5–5.0)
Alkaline Phosphatase: 71 U/L (ref 38–126)
Anion gap: 11 (ref 5–15)
BUN: 22 mg/dL (ref 8–23)
CO2: 23 mmol/L (ref 22–32)
Calcium: 9.9 mg/dL (ref 8.9–10.3)
Chloride: 104 mmol/L (ref 98–111)
Creatinine, Ser: 1.51 mg/dL — ABNORMAL HIGH (ref 0.61–1.24)
GFR, Estimated: 47 mL/min — ABNORMAL LOW (ref >60)
Glucose, Bld: 145 mg/dL — ABNORMAL HIGH (ref 70–99)
Potassium: 4.1 mmol/L (ref 3.5–5.1)
Sodium: 138 mmol/L (ref 135–145)
Total Bilirubin: 1 mg/dL (ref 0.3–1.2)
Total Protein: 6.4 g/dL — ABNORMAL LOW (ref 6.5–8.1)

## 2022-12-18 LAB — TROPONIN I (HIGH SENSITIVITY): Troponin I (High Sensitivity): 40 ng/L — ABNORMAL HIGH (ref <18)

## 2022-12-18 SURGERY — ESOPHAGOGASTRODUODENOSCOPY (EGD) WITH PROPOFOL
Anesthesia: Monitor Anesthesia Care

## 2022-12-18 MED ORDER — PHENYLEPHRINE 80 MCG/ML (10ML) SYRINGE FOR IV PUSH (FOR BLOOD PRESSURE SUPPORT)
PREFILLED_SYRINGE | INTRAVENOUS | Status: DC | PRN
  Administered 2022-12-18 (×2): 80 ug via INTRAVENOUS

## 2022-12-18 MED ORDER — LIDOCAINE 2% (20 MG/ML) 5 ML SYRINGE
INTRAMUSCULAR | Status: DC | PRN
  Administered 2022-12-18: 100 mg via INTRAVENOUS

## 2022-12-18 MED ORDER — PANTOPRAZOLE SODIUM 40 MG IV SOLR
40.0000 mg | INTRAVENOUS | Status: DC
  Administered 2022-12-19 – 2022-12-21 (×3): 40 mg via INTRAVENOUS
  Filled 2022-12-18 (×4): qty 10

## 2022-12-18 MED ORDER — SODIUM CHLORIDE 0.9 % IV SOLN: INTRAVENOUS | Status: DC

## 2022-12-18 MED ORDER — MELATONIN 3 MG PO TABS
3.0000 mg | ORAL_TABLET | Freq: Every day | ORAL | Status: DC
  Administered 2022-12-18 – 2022-12-21 (×4): 3 mg via ORAL
  Filled 2022-12-18 (×4): qty 1

## 2022-12-18 MED ORDER — SODIUM CHLORIDE 0.9% FLUSH
3.0000 mL | Freq: Two times a day (BID) | INTRAVENOUS | Status: DC
  Administered 2022-12-18 – 2022-12-21 (×7): 3 mL via INTRAVENOUS

## 2022-12-18 MED ORDER — PROPOFOL 10 MG/ML IV BOLUS
INTRAVENOUS | Status: DC | PRN
  Administered 2022-12-18: 50 mg via INTRAVENOUS
  Administered 2022-12-18: 20 mg via INTRAVENOUS
  Administered 2022-12-18: 30 mg via INTRAVENOUS

## 2022-12-18 MED ORDER — PROPOFOL 1000 MG/100ML IV EMUL
INTRAVENOUS | Status: AC
  Filled 2022-12-18: qty 100

## 2022-12-18 MED ORDER — FLUCONAZOLE 100 MG PO TABS
100.0000 mg | ORAL_TABLET | Freq: Every day | ORAL | Status: DC
  Administered 2022-12-18 – 2022-12-22 (×5): 100 mg via ORAL
  Filled 2022-12-18 (×5): qty 1

## 2022-12-18 SURGICAL SUPPLY — 15 items

## 2022-12-18 NOTE — Progress Notes (Addendum)
[facility] capsule endoscopy ordered by MD Marca Ancona.  Capsule deployed in duodenal bulb at 0931.  Per Given's capsule instructions, patient to remain NPO until 1131 at which time they may progress to clear liquid diet. At 1331 patient may have a small snack such as a half a sandwich or a bowl of soup. At 1731 patient may progress to previously ordered diet.  The capsule endoscopy study will conclude at 1931 at which time the recorder and leads can be removed and placed in a patient belongings bag. Endoscopy staff will pick up the equipment in the AM.  Eulas Post, RN 12/18/22 9:35 AM

## 2022-12-18 NOTE — Plan of Care (Signed)
  Problem: Education: Goal: Understanding of cardiac disease, CV risk reduction, and recovery process will improve Outcome: Progressing   Problem: Activity: Goal: Ability to tolerate increased activity will improve Outcome: Progressing   Problem: Cardiac: Goal: Ability to achieve and maintain adequate cardiovascular perfusion will improve Outcome: Progressing   Problem: Activity: Goal: Ability to return to baseline activity level will improve Outcome: Progressing   Problem: Health Behavior/Discharge Planning: Goal: Ability to safely manage health-related needs after discharge will improve Outcome: Progressing   Problem: Activity: Goal: Risk for activity intolerance will decrease Outcome: Progressing   Problem: Pain Managment: Goal: General experience of comfort will improve Outcome: Progressing

## 2022-12-18 NOTE — Consult Note (Signed)
Cardiology Consultation   Patient ID: Ricky MALANGA Sr. MRN: [id number]; DOB: [birth year]  Admit date: 12/16/2022 Date of Consult: 12/18/2022  PCP:  [name], MD   [facility] Providers Cardiologist:  Charlton Haws, MD        Patient Profile:   Ricky Stutes. is a 77 y.o. male with a hx of monoclonal gammopathy of undetermined significance (under current observation), prostate cancer s/p radiotherapy on Xtandi, unexplained syncope, loop recorder explant 01/27/2022, spinal stenosis, CAD s/p LAD stent 11/16/22, carotid stenosis, CVA who is being seen 12/18/2022 for the evaluation of antithrombotic therapy in the setting of recurrent GI bleeding at the request of Dr. Imogene Burn.  History of Present Illness:   Ricky Rowland presented to the hospital on 10/8 due to abnormal labs/HGB 6.7 and stools at home with melanotic appearance. This followed recent admission on 9/23 when patient was found to have active GI bleed (reported dark stools since 9/11) in the setting of syncope. Patient had just recently undergone LHC on 11/16/2022 in the setting of NSTEMI and received DES in mid-proximal LAD. Following PCI, patient was taking Effient and ASA. During 9/23 admission, Effient was held. 9/24 EGD without source of bleeding and GI cleared patient to resume DAPT. Patient started Plavix in place of Effient and continued ASA.   Today patient tells me that he has not seen full resolution of melena since first noted on 9/11. He also reports occasionally seeing spots of blood in the front of his underwear though denies evidence of hematuria. He has been chest pain free with the exception of during abdominal CT scan with contrast on 10/8. He continues with stable exertional dyspnea. He has not had any recurrent syncope since September but did become acutely weak in the shower the other day. He just mailed his 14 day Zio back in yesterday.    Past Medical History:  Diagnosis Date   Acute kidney  injury superimposed on chronic kidney disease (HCC) 11/19/2022   Borderline diabetes    DIET CONTROLLED   Bradycardia 11/17/2022   Dyslipidemia    Elevated PSA    External carotid artery stenosis    MILD-- BILATERAL   Fall 11/05/2017   GERD (gastroesophageal reflux disease)    Gout, arthritis    Hearing loss in left ear    Heart murmur    MILD --  ASYMPTOMATIC   History of gastric ulcer    REMOTE HX   Hypertension    Pre-diabetes    Prostate cancer (HCC) 12/21/2012   Gleason 3+4=7   Seizure-like activity (HCC)    Syncope and collapse    Wears glasses     Past Surgical History:  Procedure Laterality Date   CORONARY STENT INTERVENTION N/A 11/16/2022   Procedure: CORONARY STENT INTERVENTION;  Surgeon: Elder Negus, MD;  Location: [facility]ASIVE [facility];  Service: Cardiovascular;  Laterality: N/A;   CORONARY ULTRASOUND/IVUS N/A 11/16/2022   Procedure: Coronary Ultrasound/IVUS;  Surgeon: Elder Negus, MD;  Location: [facility];  Service: Cardiovascular;  Laterality: N/A;   ESOPHAGOGASTRODUODENOSCOPY (EGD) WITH PROPOFOL N/A 12/02/2022   Procedure: ESOPHAGOGASTRODUODENOSCOPY (EGD) WITH PROPOFOL;  Surgeon: Charlott Rakes, MD;  Location: [facility] ENDOSCOPY;  Service: Gastroenterology;  Laterality: N/A;   KNEE ARTHROSCOPY W/ MENISCAL REPAIR Left 03/10/2012   LEFT HEART CATH AND CORONARY ANGIOGRAPHY N/A 11/16/2022   Procedure: LEFT HEART CATH AND CORONARY ANGIOGRAPHY;  Surgeon: Elder Negus, MD;  Location: [facility];  Service: Cardiovascular;  Laterality:  N/A;   LOOP RECORDER INSERTION N/A 01/05/2018   Procedure: LOOP RECORDER INSERTION;  Surgeon: Duke Salvia, MD;  Location: [facility] INVASIVE CV LAB;  Service: Cardiovascular;  Laterality: N/A;   LUMBAR SPINE SURGERY  03/10/1989   PROSTATE BIOPSY N/A 12/21/2012   Procedure: BIOPSY TRANSRECTAL ULTRASONIC PROSTATE (TUBP);  Surgeon: Lindaann Slough, MD;  Location: [facility];  Service: Urology;   Laterality: N/A;   PROSTATE BIOPSY  11/26/2011   benign   TEMPORARY PACEMAKER N/A 11/16/2022   Procedure: TEMPORARY PACEMAKER;  Surgeon: Elder Negus, MD;  Location: [facility];  Service: Cardiovascular;  Laterality: N/A;   TONSILLECTOMY     WISDOM TOOTH EXTRACTION       Home Medications:  Prior to Admission medications   Medication Sig Start Date End Date Taking? Authorizing Provider  acetaminophen (TYLENOL) 650 MG CR tablet Take 650 mg by mouth every 8 (eight) hours as needed for pain.   Yes [provider]  allopurinol (ZYLOPRIM) 300 MG tablet Take 300 mg by mouth daily.   Yes [provider]  amLODipine (NORVASC) 10 MG tablet Take 1 tablet (10 mg total) by mouth daily. 12/05/22  Yes Leroy Sea, MD  aspirin EC 81 MG tablet Take 1 tablet (81 mg total) by mouth daily. Swallow whole. 11/19/22  Yes Dunn, Dayna N, PA-C  clopidogrel (PLAVIX) 75 MG tablet Take 1 tablet (75 mg total) by mouth daily. 12/05/22  Yes Leroy Sea, MD  docusate sodium (COLACE) 100 MG capsule Take 2 capsules (200 mg total) by mouth daily as needed for mild constipation. 12/05/22  Yes Leroy Sea, MD  Multiple Vitamins-Minerals (MULTIVITAMIN ADULT EXTRA C PO) Take 1 tablet by mouth daily. 07/24/22  Yes [provider]  Naphazoline HCl (CLEAR EYES OP) Place 1 drop into both eyes daily as needed (dry eyes).    Yes [provider]  nitroGLYCERIN (NITROSTAT) 0.4 MG SL tablet Place 1 tablet (0.4 mg total) under the tongue every 5 (five) minutes as needed for chest pain (up to 3 doses). 11/19/22  Yes Dunn, Dayna N, PA-C  ORGOVYX 120 MG tablet Take 120 mg by mouth daily. 05/19/22  Yes [provider]  pantoprazole (PROTONIX) 40 MG tablet Take 1 tablet (40 mg total) by mouth 2 (two) times daily before a meal. 12/05/22  Yes Leroy Sea, MD  polyethylene glycol powder (GLYCOLAX/MIRALAX) 17 GM/SCOOP powder Take 1 capful (17 g) with water by mouth daily as  needed for mild constipation. 12/05/22  Yes Leroy Sea, MD  REPATHA SURECLICK 140 MG/ML SOAJ Inject 140 mg into the skin every 14 (fourteen) days.  06/07/17  Yes [provider]  tolnaftate (TINACTIN) 1 % cream Apply 1 application topically daily as needed (jock itch).   Yes [provider]  XTANDI 40 MG capsule Take 160 mg by mouth daily.   Yes [provider]    Inpatient Medications: Scheduled Meds:  allopurinol  300 mg Oral Daily   amLODipine  10 mg Oral Daily   enzalutamide  160 mg Oral Daily   fluconazole  100 mg Oral Daily   [START ON 12/19/2022] pantoprazole (PROTONIX) IV  40 mg Intravenous Q24H   relugolix  120 mg Oral Daily   sodium chloride flush  3 mL Intravenous Q12H   Continuous Infusions:  PRN Meds: labetalol, nitroGLYCERIN  Allergies:    Allergies  Allergen Reactions   Ezetimibe Other (See Comments)    Muscle aches  Statins Other (See Comments)    Joint pain    Social History:   Social History   Socioeconomic History   Marital status: Married    Spouse name: geraldine   Number of children: 3   Years of education: some college   Highest education level: High school graduate  Occupational History   Occupation: retired  Tobacco Use   Smoking status: Former    Current packs/day: 0.00    Average packs/day: 1 pack/day for 30.0 years (30.0 ttl pk-yrs)    Types: Cigarettes    Start date: 12/16/1967    Quit date: 12/15/1997    Years since quitting: 25.0   Smokeless tobacco: Never  Vaping Use   Vaping status: Never Used  Substance and Sexual Activity   Alcohol use: Not Currently    Alcohol/week: 20.0 standard drinks of alcohol    Types: 20 Shots of liquor per week    Comment: OCCASIONAL, 12/28/17 stopped 11/05/17   Drug use: No   Sexual activity: Not on file  Other Topics Concern   Not on file  Social History Narrative   Lives with wife at home    caffeine use- occas, mostly decaf coffee   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    Social  Determinants of Health   Financial Resource Strain: Low Risk  (11/18/2022)   Overall Financial Resource Strain (CARDIA)    Difficulty of Paying Living Expenses: Not hard at all  Food Insecurity: No Food Insecurity (12/17/2022)   Hunger Vital Sign    Worried About Running Out of Food in the Last Year: Never true    Ran Out of Food in the Last Year: Never true  Transportation Needs: No Transportation Needs (12/17/2022)   PRAPARE - Administrator, Civil Service (Medical): No    Lack of Transportation (Non-Medical): No  Physical Activity: Not on file  Stress: Not on file  Social Connections: Not on file  Intimate Partner Violence: Not At Risk (12/17/2022)   Humiliation, Afraid, Rape, and Kick questionnaire    Fear of Current or Ex-Partner: No    Emotionally Abused: No    Physically Abused: No    Sexually Abused: No    Family History:    Family History  Problem Relation Age of Onset   Heart disease Mother    Heart disease Father    Cancer Sister        lung   Cancer Brother        prostate     ROS:  Please see the history of present illness.   All other ROS reviewed and negative.     Physical Exam/Data:   Vitals:   12/18/22 0944 12/18/22 0945 12/18/22 1000 12/18/22 1226  BP: 108/79 109/68 139/75 (!) 152/73  Pulse: (!) 57 (!) 55 (!) 59 (!) 59  Resp: 16 16 13 18   Temp: 99.3 F (37.4 C)  97.8 F (36.6 C) 97.9 F (36.6 C)  TempSrc: Tympanic   Oral  SpO2: 100% 97% 97% 100%  Weight:      Height:        Intake/Output Summary (Last 24 hours) at 12/18/2022 1546 Last data filed at 12/18/2022 0932 Gross per 24 hour  Intake 340 ml  Output 950 ml  Net -610 ml      12/17/2022    4:45 PM 12/16/2022    6:09 PM 11/26/2022   11:57 AM  Last 3 Weights  Weight (lbs) 202 lb 2.6 oz 207 lb 207 lb 3.2 oz  Weight (kg) 91.7 kg 93.895 kg 93.985 kg     Body mass index is 25.27 kg/m.  General:  Well nourished, well developed, in no acute distress HEENT: normal Neck: no  JVD Vascular: No carotid bruits; Distal pulses 2+ bilaterally Cardiac:  normal S1, S2; RRR; no murmur  Lungs:  clear to auscultation bilaterally, no wheezing, rhonchi or rales  Abd: soft, nontender, no hepatomegaly  Ext: no edema Musculoskeletal:  No deformities, BUE and BLE strength normal and equal Skin: warm and dry  Neuro:  CNs 2-12 intact, no focal abnormalities noted Psych:  Normal affect   EKG:  The EKG was personally reviewed and demonstrates:  sinus rhythm with inferior, anterior, low lateral lead ST segment depression. Similar to 11/19/22 tracing.  Telemetry:  Telemetry was personally reviewed and demonstrates:  sinus rhythm with PACs.  Relevant CV Studies:  11/16/22 LHC  Coronary intervention 11/16/2022: LM: 20% disease LAD: Prox-mid 95% calcific stenosis (culprit lesion)          Mid 30% disease Lcx: Prox 50% stenosis        OM1 50% stenosis RCA: Prox CTO. Left-to-right collaterals     Temporary transvenous pacemaker placement (removed at the end of the case) Intravascular ultrasound (IVUS) Successful percutaneous coronary intervention mid LAD        PTCA and stent placement 3.5 X 20 mm Synergy drug-eluting stent        Post dilatation using 3.75X12 and 4.0X12 mm [place] balloons up to 20 atm        Culprit lesion 95%--0% stenosis, TIMI flow I-->III, MSA 10.6 mm2        Loss of one septal branch with mild burning sensation. No conduction abnormality noted, in fact rate is improved from 40 bpm to >60 bpm, not requiring any temporary pacing. Recommend overnight IV NTG.  Diagnostic Dominance: Right  Intervention     11/15/22 TTE  IMPRESSIONS     1. Septal, apical distal anterior, mid/distal inferior wall hypokinesis .  Left ventricular ejection fraction, by estimation, is 40 to 45%. The left  ventricle has mildly decreased function. The left ventricle demonstrates  regional wall motion  abnormalities (see scoring diagram/findings for description). The left   ventricular internal cavity size was mildly dilated. There is mild left  ventricular hypertrophy. Left ventricular diastolic parameters are  consistent with Grade I diastolic  dysfunction (impaired relaxation).   2. Right ventricular systolic function is normal. The right ventricular  size is normal. There is moderately elevated pulmonary artery systolic  pressure.   3. Left atrial size was mildly dilated.   4. The mitral valve is normal in structure. Mild mitral valve  regurgitation. No evidence of mitral stenosis.   5. The aortic valve is tricuspid. There is mild calcification of the  aortic valve. Aortic valve regurgitation is mild. Aortic valve sclerosis  is present, with no evidence of aortic valve stenosis.   6. The inferior vena cava is normal in size with greater than 50%  respiratory variability, suggesting right atrial pressure of 3 mmHg.   FINDINGS   Left Ventricle: Septal, apical distal anterior, mid/distal inferior wall  hypokinesis. Left ventricular ejection fraction, by estimation, is 40 to  45%. The left ventricle has mildly decreased function. The left ventricle  demonstrates regional wall motion   abnormalities. The left ventricular internal cavity size was mildly  dilated. There is mild left ventricular hypertrophy. Left ventricular  diastolic parameters are consistent with Grade I diastolic dysfunction  (impaired relaxation).  Laboratory Data:  High Sensitivity Troponin:   Recent Labs  Lab 12/01/22 1132 12/01/22 1344 12/16/22 2145 12/17/22 0031  TROPONINIHS 27* 29* 18* 40*     Chemistry Recent Labs  Lab 12/16/22 1825 12/17/22 0419 12/18/22 0743  NA 135 137 138  K 4.0 3.7 4.1  CL 103 107 104  CO2 20* 21* 23  GLUCOSE 107* 134* 145*  BUN 41* 34* 22  CREATININE 1.74* 1.62* 1.51*  CALCIUM 9.5 9.6 9.9  GFRNONAA 39* 43* 47*  ANIONGAP 12 9 11     Recent Labs  Lab 12/16/22 1825 12/17/22 0419 12/18/22 0743  PROT 6.8 6.2* 6.4*  ALBUMIN 3.3*  2.9* 3.0*  AST 18 16 20   ALT 14 13 13   ALKPHOS 70 61 71  BILITOT 0.4 0.6 1.0   Lipids No results for input(s): "CHOL", "TRIG", "HDL", "LABVLDL", "LDLCALC", "CHOLHDL" in the last 168 hours.  Hematology Recent Labs  Lab 12/17/22 0959 12/17/22 1500 12/17/22 2143  WBC 7.5 6.6 7.0  RBC 2.52* 2.49* 2.56*  HGB 7.8* 7.8* 7.9*  HCT 23.9* 23.8* 23.8*  MCV 94.8 95.6 93.0  MCH 31.0 31.3 30.9  MCHC 32.6 32.8 33.2  RDW 14.5 14.3 14.4  PLT 223 227 229   Thyroid No results for input(s): "TSH", "FREET4" in the last 168 hours.  BNPNo results for input(s): "BNP", "PROBNP" in the last 168 hours.  DDimer No results for input(s): "DDIMER" in the last 168 hours.   Radiology/Studies:  CT ABDOMEN PELVIS W CONTRAST  Result Date: 12/16/2022 CLINICAL DATA:  Abdomen pain anemia rectal bleeding, history of prostate cancer EXAM: CT ABDOMEN AND PELVIS WITH CONTRAST TECHNIQUE: Multidetector CT imaging of the abdomen and pelvis was performed using the standard protocol following bolus administration of intravenous contrast. RADIATION DOSE REDUCTION: This exam was performed according to the departmental dose-optimization program which includes automated exposure control, adjustment of the mA and/or kV according to patient size and/or use of iterative reconstruction technique. CONTRAST:  60mL OMNIPAQUE IOHEXOL 350 MG/ML SOLN COMPARISON:  PET CT 05/09/2022 FINDINGS: Lower chest: Lung bases demonstrate no acute airspace disease. Mild coronary vascular calcification Hepatobiliary: No focal hepatic abnormality or biliary dilatation. Small gallstones. Pancreas: Unremarkable. No pancreatic ductal dilatation or surrounding inflammatory changes. Spleen: Normal in size without focal abnormality. Adrenals/Urinary Tract: Adrenal glands are unremarkable. Kidneys are normal, without renal calculi, focal lesion, or hydronephrosis. Bladder is unremarkable. Stomach/Bowel: Stomach is within normal limits. Appendix appears normal. No  evidence of bowel wall thickening, distention, or inflammatory changes. Diverticular disease of the left colon Vascular/Lymphatic: Moderate aortic atherosclerosis. No aneurysm. No suspicious lymph nodes Reproductive: Fiducial markers in the prostate. Penile prosthesis with reservoir in the right anterior pelvis indenting the bladder Other: Negative for pelvic effusion or free air Musculoskeletal: No acute osseous abnormality. Sclerotic lesion involving the left pedicle and posterior arch at L4 consistent with metastatic disease. IMPRESSION: 1. No CT evidence for acute intra-abdominal or pelvic abnormality. 2. Diverticular disease of the left colon without acute inflammatory process. 3. Cholelithiasis. 4. Sclerotic lesion at L4 consistent with metastatic disease. 5. Aortic atherosclerosis. Aortic Atherosclerosis (ICD10-I70.0). Electronically Signed   By: Jasmine Pang M.D.   On: 12/16/2022 21:47     Assessment and Plan:   CAD s/p prox-mid LAD DES placed 11/16/22 Acute blood loss anemia  S/P LHC on 11/16/2022 in the setting of NSTEMI and received [place] Synergy 3.5x20 in mid-proximal LAD. Initial DAPT with Effient and ASA, switched to Plavix and ASA following 9/23 admission with GIB of unidentified source.  Patient now back in the hospital with ongoing melanotic stools and acute anemia. HGB 6.7->7.4->7.8.  Overall challenging situation given stent age/location. With patient back in the hospital for a second time in the last month with GIB, need to consider anti-platelet monotherapy if source of GIB not identified. If monotherapy needed, recommend Plavix 75mg  daily. Per CAPRIE trial, GI hemorrhage occurred at rate of 2.0% in patients taking Plavix vs 2.7% in those on ASA. Hospitalization risk also lower in Plavix group. Continue daily PPI. At discharge, will need Protonix Rx given Plavix use due to CYP2C19 inhibition by other PPIs. Continue Repatha  Chronic systolic heart failure  EF 40 to 45% on echo  11/15/2022. Euvolemic on physical exam today. BP mildly elevated this admission.  Renal function near baseline this admission, would consider resuming Losartan that was previously discontinued due to AKI during last admission.  No beta blocker with bradycardia Consider SGLT2 (copay appears to be $0).   Syncope  Patient with syncope prior to 9/23 admission. No recurrent syncope. Zio monitor has been mailed in. Will follow results.   Risk Assessment/Risk Scores:        New York Heart Association (NYHA) Functional Class NYHA Class I        For questions or updates, please contact  [facility] Please consult [email or web address] for contact info under    Signed, Perlie Gold, PA-C  12/18/2022 3:46 PM

## 2022-12-18 NOTE — Transfer of Care (Signed)
Immediate Anesthesia Transfer of Care Note  Patient: Ricky LIENHARD Sr.  Procedure(s) Performed: ESOPHAGOGASTRODUODENOSCOPY (EGD) WITH PROPOFOL GIVENS CAPSULE STUDY  Patient Location: PACU  Anesthesia Type:MAC  Level of Consciousness: awake, alert , and oriented  Airway & Oxygen Therapy: Patient Spontanous Breathing  Post-op Assessment: Report given to RN and Post -op Vital signs reviewed and stable  Post vital signs: Reviewed and stable  Last Vitals:  Vitals Value Taken Time  BP 108/79 12/18/22 0944  Temp 37.4 C 12/18/22 0944  Pulse 60 12/18/22 0944  Resp 16 12/18/22 0944  SpO2 98 % 12/18/22 0944  Vitals shown include unfiled device data.  Last Pain:  Vitals:   12/18/22 0944  TempSrc: Tympanic  PainSc:          Complications: No notable events documented.

## 2022-12-18 NOTE — Interval H&P Note (Signed)
History and Physical Interval Note: 77/male with severe symptomatic anemia on plavix and ASA with melena for EGD and possible pillcam deployment with propofol.  12/18/2022 8:37 AM  Ricky Panther Sr.  has presented today for EGD and possible pillcam deployment with propofol, with the diagnosis of Melena, on plavix, anemia.  The various methods of treatment have been discussed with the patient and family. After consideration of risks, benefits and other options for treatment, the patient has consented to  Procedure(s): ESOPHAGOGASTRODUODENOSCOPY (EGD) WITH PROPOFOL (N/A) GIVENS CAPSULE STUDY (N/A) as a surgical intervention.  The patient's history has been reviewed, patient examined, no change in status, stable for surgery.  I have reviewed the patient's chart and labs.  Questions were answered to the patient's satisfaction.     Kerin Salen

## 2022-12-18 NOTE — Op Note (Signed)
[facility] Patient Name: Ricky Rowland Procedure Date : 12/18/2022 MRN: [id number] Attending MD: Kerin Salen , MD, [id number] Date of Birth: 11-Aug-1945 CSN: [id number] Age: 77 Admit Type: Inpatient Procedure:                Upper GI endoscopy Indications:              Unexplained iron deficiency anemia, Melena Providers:                Kerin Salen, MD, Fransisca Connors, Marja Kays,                            Technician Referring MD:             [facility] Medicines:                Monitored Anesthesia Care Complications:            No immediate complications. Estimated Blood Loss:     Estimated blood loss: none. Procedure:                Pre-Anesthesia Assessment:                           - Prior to the procedure, a History and Physical                            was performed, and patient medications and                            allergies were reviewed. The patient's tolerance of                            previous anesthesia was also reviewed. The risks                            and benefits of the procedure and the sedation                            options and risks were discussed with the patient.                            All questions were answered, and informed consent                            was obtained. Prior Anticoagulants: The patient has                            taken Plavix (clopidogrel), last dose was 2 days                            prior to procedure. ASA Grade Assessment: III - A                            patient with severe systemic disease. After  reviewing the risks and benefits, the patient was                            deemed in satisfactory condition to undergo the                            procedure.                           After obtaining informed consent, the endoscope was                            passed under direct vision. Throughout the                            procedure, the  patient's blood pressure, pulse, and                            oxygen saturations were monitored continuously. The                            [id number]-H190 ([id number] Olympus endoscope was introduced                            through the mouth, and advanced to the second part                            of duodenum. The upper GI endoscopy was                            accomplished without difficulty. The patient                            tolerated the procedure well. Scope In: Scope Out: Findings:      Patchy, white plaques were found in the entire esophagus. Biopsy is       contraindicated because was on plavix 2 days ago.      A widely patent Schatzki ring was found at the gastroesophageal junction.      A 2 cm hiatal hernia was present.      The entire examined stomach was normal.      The cardia and gastric fundus were normal on retroflexion.      The examined duodenum was normal. Using the endoscope, the video capsule       enteroscope was advanced into the duodenal bulb. Impression:               - Esophageal plaques were found, suspicious for                            candidiasis. Biopsy is contraindicated.                           - Widely patent Schatzki ring.                           - 2 cm  hiatal hernia.                           - Normal stomach.                           - Normal examined duodenum.                           - Successful completion of the Video Capsule                            Enteroscope placement.                           - No specimens collected. Moderate Sedation:      Patient did not receive moderate sedation for this procedure, but       instead received monitored anesthesia care. Recommendation:           - Diflucan (fluconazole) 100 mg PO daily for 7 days.                           - Will review small bowel pillcam study tomorrow,                            if unremarkable, we may need to proceed with a                             colonoscopy. Procedure Code(s):        --- Professional ---                           [phone], Esophagogastroduodenoscopy, flexible,                            transoral; diagnostic, including collection of                            specimen(s) by brushing or washing, when performed                            (separate procedure) Diagnosis Code(s):        --- Professional ---                           K22.9, Disease of esophagus, unspecified                           K22.2, Esophageal obstruction                           K44.9, Diaphragmatic hernia without obstruction or                            gangrene                           D50.9, Iron deficiency anemia, unspecified  K92.1, Melena (includes Hematochezia) CPT copyright 2022 [employer]. All rights reserved. The codes documented in this report are preliminary and upon coder review may  be revised to meet current compliance requirements. Kerin Salen, MD 12/18/2022 9:46:04 AM This report has been signed electronically. Number of Addenda: 0

## 2022-12-18 NOTE — Anesthesia Postprocedure Evaluation (Signed)
Anesthesia Post Note  Patient: Ricky WHACK Sr.  Procedure(s) Performed: ESOPHAGOGASTRODUODENOSCOPY (EGD) WITH PROPOFOL GIVENS CAPSULE STUDY     Patient location during evaluation: PACU Anesthesia Type: MAC Level of consciousness: awake and alert Pain management: pain level controlled Vital Signs Assessment: post-procedure vital signs reviewed and stable Respiratory status: spontaneous breathing, nonlabored ventilation and respiratory function stable Cardiovascular status: stable and blood pressure returned to baseline Postop Assessment: no apparent nausea or vomiting Anesthetic complications: no  No notable events documented.  Last Vitals:  Vitals:   12/18/22 0945 12/18/22 1000  BP: 109/68 139/75  Pulse: (!) 55 (!) 59  Resp: 16 13  Temp:    SpO2: 97% 97%    Last Pain:  Vitals:   12/18/22 0944  TempSrc: Tympanic  PainSc:                  [name],[name]

## 2022-12-18 NOTE — Progress Notes (Signed)
Pt off unit for procedure.

## 2022-12-18 NOTE — Anesthesia Preprocedure Evaluation (Addendum)
Anesthesia Evaluation  Patient identified by MRN, date of birth, ID band Patient awake    Reviewed: Allergy & Precautions, H&P , NPO status , Patient's Chart, lab work & pertinent test results  Airway Mallampati: II  TM Distance: >3 FB Neck ROM: Full    Dental no notable dental hx. (+) Edentulous Upper, Dental Advisory Given   Pulmonary former smoker   Pulmonary exam normal breath sounds clear to auscultation       Cardiovascular hypertension, Pt. on medications + angina  + CAD, + Past MI and + Cardiac Stents   Rhythm:Regular Rate:Normal     Neuro/Psych negative neurological ROS  negative psych ROS   GI/Hepatic Neg liver ROS,GERD  Medicated,,  Endo/Other  negative endocrine ROS    Renal/GU Renal InsufficiencyRenal disease  negative genitourinary   Musculoskeletal  (+) Arthritis , Osteoarthritis,    Abdominal   Peds  Hematology  (+) Blood dyscrasia, anemia   Anesthesia Other Findings   Reproductive/Obstetrics negative OB ROS                             Anesthesia Physical Anesthesia Plan  ASA: 3  Anesthesia Plan: MAC   Post-op Pain Management: Minimal or no pain anticipated   Induction: Intravenous  PONV Risk Score and Plan: 1 and Propofol infusion  Airway Management Planned: Natural Airway and Nasal Cannula  Additional Equipment:   Intra-op Plan:   Post-operative Plan:   Informed Consent: I have reviewed the patients History and Physical, chart, labs and discussed the procedure including the risks, benefits and alternatives for the proposed anesthesia with the patient or authorized representative who has indicated his/her understanding and acceptance.     Dental advisory given  Plan Discussed with: CRNA  Anesthesia Plan Comments:        Anesthesia Quick Evaluation

## 2022-12-18 NOTE — Progress Notes (Addendum)
PROGRESS NOTE    Ricky Rowland  ZOX:[id number] DOB: December 22, 1945 DOA: 12/16/2022 PCP: [name], MD  Subjective: Patient seen and examined.  Met with pt and wife. EGD did not find source of bleeding. Pill video camera placed. HgB stable.   Hospital Course: HPI: Ricky Heyse. is a 77 y.o. male with medical history significant of recent GI bleed with workup performed on 924 with EGD who is on dual antiplatelet therapy at home, history of coronary artery disease status post recent PCI again in September of this year.  Patient has been on aspirin and Effient afterwards, hypertension, hyperlipidemia, MGUS, chronic kidney disease stage IIIb and hyperlipidemia who presented to the ER today with recurrent GI bleed.  Patient was evaluated previously for the GI bleed after he was on dual antiplatelet therapy as a result of his coronary artery disease.  The EGD that he had done did not show any source of bleeding.  His H&H was stable at that time and was placed back on his dual antiplatelet therapy.  Patient however is back here again with another episode of melena with subsequent drop in his hemoglobin.  At time of discharge hemoglobin was around 9.9 g.  It is now dropped to 6.7.  Patient reported seeing the melena again.  At this time therefore is being admitted for management of GI bleed.  Denied any nausea or vomiting.  Denied any bright red blood per rectum but mainly the melena.  Patient however is having some weakness and lightheadedness.  He is being admitted for further evaluation and treatment.   Has had recurrent melanotic stools.  Just had a heart attack at the beginning of September 2028.  Had a left heart cath.  Required stenting.  Has a drug-eluting stent in the mid LAD.  Was placed on dual antiplatelet.  He was discharged from the hospital on December 05, 2022.  At that time he was discharged to home on aspirin and Plavix.  His Brilinta had been stopped.  Presents now back with  GI bleeding.  Received 1 unit of blood.  GI has been consulted.  Has not had a colonoscopy for at least 2 years.  Already had an EGD done last month that showed some mild gastritis in the antrum.  Significant Events: Admitted 12/16/2022 Repeat EGD 12-18-2022. No source of upper GI bleeding found  Significant Labs: Admitting HgB 6.7 g/dl  Significant Imaging Studies: Admitting CT abd shows 1. No CT evidence for acute intra-abdominal or pelvic abnormality. 2. Diverticular disease of the left colon without acute inflammatory process. 3. Cholelithiasis. 4. Sclerotic lesion at L4 consistent with metastatic disease. 5. Aortic atherosclerosis.  Antibiotic Therapy: Anti-infectives (From admission, onward)    None       Procedures: 1 unit PRBC transfused 12-17-2022 EGD 12-18-2022 Findings: Patchy, white plaques were found in the entire esophagus. Biopsy is contraindicated because was on plavix 2 days ago. A widely patent Schatzki ring was found at the gastroesophageal junction. A 2 cm hiatal hernia was present. The entire examined stomach was normal. The cardia and gastric fundus were normal on retroflexion. The examined duodenum was normal. Using the endoscope, the video capsule enteroscope was advanced into the duodenal bulb. Impression:  - Esophageal plaques were found, suspicious for candidiasis. Biopsy is contraindicated. - Widely patent Schatzki ring. 2 cm hiatal hernia.  - Normal stomach.- Normal examined duodenum.- Successful completion of the Video Capsule       Enteroscope placement.- No specimens collected.  Consultants:  GI - [facility]    Assessment and Plan: * GI bleed Admitted for recurrent GI bleeding.  GI was consulted.  transfused with 1 unit of blood on 12-17-2022.  Placed on Protonix 40 mg IV twice daily.  Given that he just had a drug-eluting stent placed less than a month ago, he really needs to be on dual antiplatelet therapy.    12-18-2022. Had another negative EGD for source  of bleeding. VCE performed. Awaiting results for pill enteroscopy. May need colonoscopy.  Symptomatic anemia Due to GI bleeding.  Status post 1 unit of packed cell transfusion.  Hemoglobin stable at 7.9 today.  Was 6.7 g/dl prior to transfusion.  Ischemic cardiomyopathy Last EF of 40 to 45% in September 2024.  Not volume overloaded.  Currently not on DAPT due to recurrent GI bleeding. Will ask cardiology to weigh in.  Essential hypertension Stable on 10 mg norvasc daily. Continue to monitor.  Dyslipidemia Stable.  On Repatha.  Zetia causes muscle pain and statins caused joint pain.  Malignant neoplasm of prostate (HCC) Stable.  Has already had XRT to his bony mets in his spine.  CKD stage 3a, GFR 45-59 ml/min (HCC) - baseline SCr 1.5-1.7 Stable.  Baseline creatinine 1.5-1.7.   DVT prophylaxis: SCDs Start: 12/17/22 0314    Code Status: Full Code Family Communication: discussed with pt and his wife at bedside Disposition Plan: return home Reason for continuing need for hospitalization: awaiting further input from GI, awaiting results of video capsule endoscopy.  Objective: Vitals:   12/18/22 0944 12/18/22 0945 12/18/22 1000 12/18/22 1226  BP: 108/79 109/68 139/75 (!) 152/73  Pulse: (!) 57 (!) 55 (!) 59 (!) 59  Resp: 16 16 13 18   Temp: 99.3 F (37.4 C)  97.8 F (36.6 C) 97.9 F (36.6 C)  TempSrc: Tympanic   Oral  SpO2: 100% 97% 97% 100%  Weight:      Height:        Intake/Output Summary (Last 24 hours) at 12/18/2022 1350 Last data filed at 12/18/2022 0932 Gross per 24 hour  Intake 340 ml  Output 950 ml  Net -610 ml   Filed Weights   12/16/22 1809 12/17/22 1645  Weight: 93.9 kg 91.7 kg    Examination:  Physical Exam Vitals and nursing note reviewed.  Constitutional:      General: He is not in acute distress.    Appearance: He is normal weight. He is not ill-appearing, toxic-appearing or diaphoretic.  HENT:     Head: Normocephalic and atraumatic.      Nose: Nose normal.  Eyes:     General: No scleral icterus. Cardiovascular:     Rate and Rhythm: Normal rate and regular rhythm.     Pulses: Normal pulses.  Pulmonary:     Effort: Pulmonary effort is normal.  Abdominal:     General: Bowel sounds are normal. There is no distension.     Palpations: Abdomen is soft.     Tenderness: There is no abdominal tenderness.     Comments: Round abdomen.  Musculoskeletal:     Right lower leg: No edema.     Left lower leg: No edema.  Skin:    General: Skin is warm and dry.     Capillary Refill: Capillary refill takes less than 2 seconds.  Neurological:     General: No focal deficit present.     Mental Status: He is alert and oriented to person, place, and time.     Data Reviewed: I have  personally reviewed following labs and imaging studies  CBC: Recent Labs  Lab 12/16/22 1825 12/17/22 0419 12/17/22 0959 12/17/22 1500 12/17/22 2143  WBC 6.3 6.4 7.5 6.6 7.0  HGB 6.7* 7.4* 7.8* 7.8* 7.9*  HCT 20.6* 22.3* 23.9* 23.8* 23.8*  MCV 97.6 93.3 94.8 95.6 93.0  PLT 239 211 223 227 229   Basic Metabolic Panel: Recent Labs  Lab 12/16/22 1825 12/17/22 0419 12/18/22 0743  NA 135 137 138  K 4.0 3.7 4.1  CL 103 107 104  CO2 20* 21* 23  GLUCOSE 107* 134* 145*  BUN 41* 34* 22  CREATININE 1.74* 1.62* 1.51*  CALCIUM 9.5 9.6 9.9   GFR: Estimated Creatinine Clearance: 47.4 mL/min (A) (by C-G formula based on SCr of 1.51 mg/dL (H)). Liver Function Tests: Recent Labs  Lab 12/16/22 1825 12/17/22 0419 12/18/22 0743  AST 18 16 20   ALT 14 13 13   ALKPHOS 70 61 71  BILITOT 0.4 0.6 1.0  PROT 6.8 6.2* 6.4*  ALBUMIN 3.3* 2.9* 3.0*   Radiology Studies: CT ABDOMEN PELVIS W CONTRAST  Result Date: 12/16/2022 CLINICAL DATA:  Abdomen pain anemia rectal bleeding, history of prostate cancer EXAM: CT ABDOMEN AND PELVIS WITH CONTRAST TECHNIQUE: Multidetector CT imaging of the abdomen and pelvis was performed using the standard protocol following bolus  administration of intravenous contrast. RADIATION DOSE REDUCTION: This exam was performed according to the departmental dose-optimization program which includes automated exposure control, adjustment of the mA and/or kV according to patient size and/or use of iterative reconstruction technique. CONTRAST:  60mL OMNIPAQUE IOHEXOL 350 MG/ML SOLN COMPARISON:  PET CT 05/09/2022 FINDINGS: Lower chest: Lung bases demonstrate no acute airspace disease. Mild coronary vascular calcification Hepatobiliary: No focal hepatic abnormality or biliary dilatation. Small gallstones. Pancreas: Unremarkable. No pancreatic ductal dilatation or surrounding inflammatory changes. Spleen: Normal in size without focal abnormality. Adrenals/Urinary Tract: Adrenal glands are unremarkable. Kidneys are normal, without renal calculi, focal lesion, or hydronephrosis. Bladder is unremarkable. Stomach/Bowel: Stomach is within normal limits. Appendix appears normal. No evidence of bowel wall thickening, distention, or inflammatory changes. Diverticular disease of the left colon Vascular/Lymphatic: Moderate aortic atherosclerosis. No aneurysm. No suspicious lymph nodes Reproductive: Fiducial markers in the prostate. Penile prosthesis with reservoir in the right anterior pelvis indenting the bladder Other: Negative for pelvic effusion or free air Musculoskeletal: No acute osseous abnormality. Sclerotic lesion involving the left pedicle and posterior arch at L4 consistent with metastatic disease. IMPRESSION: 1. No CT evidence for acute intra-abdominal or pelvic abnormality. 2. Diverticular disease of the left colon without acute inflammatory process. 3. Cholelithiasis. 4. Sclerotic lesion at L4 consistent with metastatic disease. 5. Aortic atherosclerosis. Aortic Atherosclerosis (ICD10-I70.0). Electronically Signed   By: Jasmine Pang M.D.   On: 12/16/2022 21:47    Scheduled Meds:  allopurinol  300 mg Oral Daily   amLODipine  10 mg Oral Daily    enzalutamide  160 mg Oral Daily   fluconazole  100 mg Oral Daily   [START ON 12/19/2022] pantoprazole (PROTONIX) IV  40 mg Intravenous Q24H   relugolix  120 mg Oral Daily   sodium chloride flush  3 mL Intravenous Q12H   Continuous Infusions:   LOS: 2 days   Time spent: 35 minutes  Ricky Herter, DO  [facility]  12/18/2022, 1:50 PM

## 2022-12-18 NOTE — OR Nursing (Signed)
Top dentures placed back in patient mouth per patient request.

## 2022-12-19 DIAGNOSIS — I10 Essential (primary) hypertension: Secondary | ICD-10-CM | POA: Diagnosis not present

## 2022-12-19 DIAGNOSIS — C61 Malignant neoplasm of prostate: Secondary | ICD-10-CM

## 2022-12-19 DIAGNOSIS — K922 Gastrointestinal hemorrhage, unspecified: Secondary | ICD-10-CM | POA: Diagnosis not present

## 2022-12-19 DIAGNOSIS — I255 Ischemic cardiomyopathy: Secondary | ICD-10-CM | POA: Diagnosis not present

## 2022-12-19 DIAGNOSIS — D649 Anemia, unspecified: Secondary | ICD-10-CM | POA: Diagnosis not present

## 2022-12-19 MED ORDER — MELATONIN 5 MG PO TABS: 10.0000 mg | ORAL_TABLET | Freq: Every evening | ORAL | Status: DC | PRN

## 2022-12-19 NOTE — Progress Notes (Signed)
Currently his capsule is being downloaded and his case discussed with my partner please call me this weekend if any GI question or problem otherwise we will read the capsule ASAP and check on the patient currently hemoglobin appears stable

## 2022-12-19 NOTE — Progress Notes (Signed)
PROCEDURE NOTE  12/18/2022  5:11 PM  PATIENT:  Ricky Panther Sr.  77 y.o. male  PRE-OPERATIVE DIAGNOSIS: Subacute GI bleed  POST-OPERATIVE DIAGNOSIS: Same PROCEDURE: Capsule endoscopy  SURGEON:  Surgeon(s): Kerin Salen, MD Kerin Salen, MD  ASSESSMENT/FINDINGS: 2 proximal nonbleeding small bowel AVMs seen without any active bleeding throughout the small bowel or parts of the colon visualized  PLAN OF CARE: Will check on tomorrow to discuss the timing of a enteroscope and see if we can find the 2 AVMs and cauterized them

## 2022-12-19 NOTE — Plan of Care (Signed)

## 2022-12-19 NOTE — Evaluation (Signed)
Occupational Therapy Evaluation and Discharge Patient Details Name: Ricky BERNICK Sr. MRN: [id number] DOB: 13-Jul-1945 Today's Date: 12/19/2022   History of Present Illness Pt is a 77 year old man admitted on 10/8 with ongoing dark stools and low hemoglobin. PMH: CAD s/p STEMI, HTN, HLD, MGUS, CKD III.   Clinical Impression   Pt is functioning independently. Encouraged to walk in hallway. No OT needs.       If plan is discharge home, recommend the following:      Functional Status Assessment  Patient has not had a recent decline in their functional status  Equipment Recommendations  None recommended by OT    Recommendations for Other Services       Precautions / Restrictions Precautions Precautions: None Restrictions Weight Bearing Restrictions: No      Mobility Bed Mobility                    Transfers Overall transfer level: Independent Equipment used: None                      Balance Overall balance assessment: Independent                                         ADL either performed or assessed with clinical judgement   ADL Overall ADL's : Independent                                             Vision Baseline Vision/History: 1 Wears glasses Ability to See in Adequate Light: 0 Adequate Patient Visual Report: No change from baseline       Perception         Praxis         Pertinent Vitals/Pain Pain Assessment Pain Assessment: No/denies pain     Extremity/Trunk Assessment Upper Extremity Assessment Upper Extremity Assessment: Overall WFL for tasks assessed   Lower Extremity Assessment Lower Extremity Assessment: Overall WFL for tasks assessed   Cervical / Trunk Assessment Cervical / Trunk Assessment: Normal   Communication Communication Communication: Hearing impairment   Cognition Arousal: Alert Behavior During Therapy: WFL for tasks assessed/performed Overall Cognitive  Status: Within Functional Limits for tasks assessed                                       General Comments       Exercises     Shoulder Instructions      Home Living Family/patient expects to be discharged to:: Private residence Living Arrangements: Spouse/significant other Available Help at Discharge: Family;Available 24 hours/day Type of Home: House Home Access: Stairs to enter     Home Layout: Two level;Bed/bath upstairs   Alternate Level Stairs-Rails: Right Bathroom Shower/Tub: Chief Strategy Officer: Standard     Home Equipment: Agricultural consultant (2 wheels);Shower seat;Cane - single point          Prior Functioning/Environment Prior Level of Function : Independent/Modified Independent;Driving             Mobility Comments: walking with a cane recently          OT Problem List:  OT Treatment/Interventions:      OT Goals(Current goals can be found in the care plan section)    OT Frequency:      Co-evaluation              AM-PAC OT "6 Clicks" Daily Activity     Outcome Measure Help from another person eating meals?: None Help from another person taking care of personal grooming?: None Help from another person toileting, which includes using toliet, bedpan, or urinal?: None Help from another person bathing (including washing, rinsing, drying)?: None Help from another person to put on and taking off regular upper body clothing?: None Help from another person to put on and taking off regular lower body clothing?: None 6 Click Score: 24   End of Session    Activity Tolerance: Patient tolerated treatment well Patient left: in chair;with call bell/phone within reach                   Time: 0932-0951 OT Time Calculation (min): 19 min Charges:  OT General Charges $OT Visit: 1 Visit OT Evaluation $OT Eval Low Complexity: 1 Low  Berna Spare, OTR/L Acute Rehabilitation Services Office: [phone]  Evern Bio 12/19/2022, 9:56 AM

## 2022-12-19 NOTE — Progress Notes (Signed)
PROGRESS NOTE    Ricky Rowland  NGE:[id number] DOB: 06-14-1945 DOA: 12/16/2022 PCP: [name], MD  Subjective: Patient seen and examined. Pt completed his video capsule endoscopy. Awaiting GI report HgB stable. On RA. No chest pain Thanks for Cards consult. They want him only on plavix when possible. No ASA.   Hospital Course: HPI: Ricky Rowland. is a 77 y.o. male with medical history significant of recent GI bleed with workup performed on 924 with EGD who is on dual antiplatelet therapy at home, history of coronary artery disease status post recent PCI again in September of this year.  Patient has been on aspirin and Effient afterwards, hypertension, hyperlipidemia, MGUS, chronic kidney disease stage IIIb and hyperlipidemia who presented to the ER today with recurrent GI bleed.  Patient was evaluated previously for the GI bleed after he was on dual antiplatelet therapy as a result of his coronary artery disease.  The EGD that he had done did not show any source of bleeding.  His H&H was stable at that time and was placed back on his dual antiplatelet therapy.  Patient however is back here again with another episode of melena with subsequent drop in his hemoglobin.  At time of discharge hemoglobin was around 9.9 g.  It is now dropped to 6.7.  Patient reported seeing the melena again.  At this time therefore is being admitted for management of GI bleed.  Denied any nausea or vomiting.  Denied any bright red blood per rectum but mainly the melena.  Patient however is having some weakness and lightheadedness.  He is being admitted for further evaluation and treatment.   Has had recurrent melanotic stools.  Just had a heart attack at the beginning of September 2028.  Had a left heart cath.  Required stenting.  Has a drug-eluting stent in the mid LAD.  Was placed on dual antiplatelet.  He was discharged from the hospital on December 05, 2022.  At that time he was discharged to home  on aspirin and Plavix.  His Brilinta had been stopped.  Presents now back with GI bleeding.  Received 1 unit of blood.  GI has been consulted.  Has not had a colonoscopy for at least 2 years.  Already had an EGD done last month that showed some mild gastritis in the antrum.  Significant Events: Admitted 12/16/2022 Repeat EGD 12-18-2022. No source of upper GI bleeding found  Significant Labs: Admitting HgB 6.7 g/dl  Significant Imaging Studies: Admitting CT abd shows 1. No CT evidence for acute intra-abdominal or pelvic abnormality. 2. Diverticular disease of the left colon without acute inflammatory process. 3. Cholelithiasis. 4. Sclerotic lesion at L4 consistent with metastatic disease. 5. Aortic atherosclerosis.  Antibiotic Therapy: Anti-infectives (From admission, onward)    None       Procedures: 1 unit PRBC transfused 12-17-2022 EGD 12-18-2022 Findings: Patchy, white plaques were found in the entire esophagus. Biopsy is contraindicated because was on plavix 2 days ago. A widely patent Schatzki ring was found at the gastroesophageal junction. A 2 cm hiatal hernia was present. The entire examined stomach was normal. The cardia and gastric fundus were normal on retroflexion. The examined duodenum was normal. Using the endoscope, the video capsule enteroscope was advanced into the duodenal bulb. Impression:  - Esophageal plaques were found, suspicious for candidiasis. Biopsy is contraindicated. - Widely patent Schatzki ring. 2 cm hiatal hernia.  - Normal stomach.- Normal examined duodenum.- Successful completion of the Video Capsule  Enteroscope placement.- No specimens collected.  Consultants: GI - [facility]    Assessment and Plan: * GI bleed Admitted for recurrent GI bleeding.  GI was consulted.  transfused with 1 unit of blood on 12-17-2022.  Placed on Protonix 40 mg IV twice daily.  Given that he just had a drug-eluting stent placed less than a month ago, he really needs to be on  dual antiplatelet therapy.    12-18-2022. Had another negative EGD for source of bleeding. VCE performed. Awaiting results for pill enteroscopy. May need colonoscopy. 12-19-2022 awaiting VCE report. HgB stable. Pt may shower. Tolerating solid food.  Symptomatic anemia Due to GI bleeding.  Status post 1 unit of packed cell transfusion.  Hemoglobin stable.  Was 6.7 g/dl prior to transfusion.  Ischemic cardiomyopathy Last EF of 40 to 45% in September 2024.  Not volume overloaded.  Currently not on DAPT due to recurrent GI bleeding. Will ask cardiology to weigh in. 12-18-2022 pt seen by cardiology. Due to pt being >1 month out from his PCI, cardiology has recommended single agent therapy with Plavix only. No ASA.  Essential hypertension Stable on 10 mg norvasc daily. Continue to monitor.  Dyslipidemia Stable.  On Repatha.  Zetia causes muscle pain and statins caused joint pain.  Malignant neoplasm of prostate (HCC) Stable.  Has already had XRT to his bony mets in his spine.  CKD stage 3a, GFR 45-59 ml/min (HCC) - baseline SCr 1.5-1.7 Stable.  Baseline creatinine 1.5-1.7.   DVT prophylaxis: SCDs Start: 12/17/22 0314    Code Status: Full Code Family Communication: no family at bedside. Pt is decisional Disposition Plan: return home Reason for continuing need for hospitalization: awaiting VCE report to decide if pt needs inpatient colonoscopy.  Objective: Vitals:   12/18/22 1600 12/18/22 2046 12/19/22 0405 12/19/22 0841  BP: (!) 141/70 (!) 114/59 110/64 120/72  Pulse: (!) 58 71 69 (!) 57  Resp: 18 18 18    Temp: 98.3 F (36.8 C) 98.2 F (36.8 C) 98.6 F (37 C) 98.1 F (36.7 C)  TempSrc: Oral Oral  Oral  SpO2: 100% 99% 99% 100%  Weight:      Height:       No intake or output data in the 24 hours ending 12/19/22 1345 Filed Weights   12/16/22 1809 12/17/22 1645  Weight: 93.9 kg 91.7 kg    Examination:  Physical Exam Vitals and nursing note reviewed.  Constitutional:       General: He is not in acute distress.    Appearance: He is normal weight. He is not ill-appearing, toxic-appearing or diaphoretic.  HENT:     Head: Normocephalic and atraumatic.     Nose: Nose normal.  Eyes:     General: No scleral icterus. Cardiovascular:     Rate and Rhythm: Normal rate and regular rhythm.     Pulses: Normal pulses.  Pulmonary:     Effort: Pulmonary effort is normal.  Abdominal:     General: Bowel sounds are normal. There is no distension.     Palpations: Abdomen is soft.     Tenderness: There is no abdominal tenderness.     Comments: Round abdomen.  Musculoskeletal:     Right lower leg: No edema.     Left lower leg: No edema.  Skin:    General: Skin is warm and dry.     Capillary Refill: Capillary refill takes less than 2 seconds.  Neurological:     General: No focal deficit present.  Mental Status: He is alert and oriented to person, place, and time.     Data Reviewed: I have personally reviewed following labs and imaging studies  CBC: Recent Labs  Lab 12/16/22 1825 12/17/22 0419 12/17/22 0959 12/17/22 1500 12/17/22 2143  WBC 6.3 6.4 7.5 6.6 7.0  HGB 6.7* 7.4* 7.8* 7.8* 7.9*  HCT 20.6* 22.3* 23.9* 23.8* 23.8*  MCV 97.6 93.3 94.8 95.6 93.0  PLT 239 211 223 227 229   Basic Metabolic Panel: Recent Labs  Lab 12/16/22 1825 12/17/22 0419 12/18/22 0743  NA 135 137 138  K 4.0 3.7 4.1  CL 103 107 104  CO2 20* 21* 23  GLUCOSE 107* 134* 145*  BUN 41* 34* 22  CREATININE 1.74* 1.62* 1.51*  CALCIUM 9.5 9.6 9.9   GFR: Estimated Creatinine Clearance: 47.4 mL/min (A) (by C-G formula based on SCr of 1.51 mg/dL (H)). Liver Function Tests: Recent Labs  Lab 12/16/22 1825 12/17/22 0419 12/18/22 0743  AST 18 16 20   ALT 14 13 13   ALKPHOS 70 61 71  BILITOT 0.4 0.6 1.0  PROT 6.8 6.2* 6.4*  ALBUMIN 3.3* 2.9* 3.0*   Radiology Studies: No results found.  Scheduled Meds:  allopurinol  300 mg Oral Daily   amLODipine  10 mg Oral Daily    enzalutamide  160 mg Oral Daily   fluconazole  100 mg Oral Daily   melatonin  3 mg Oral QHS   pantoprazole (PROTONIX) IV  40 mg Intravenous Q24H   relugolix  120 mg Oral Daily   sodium chloride flush  3 mL Intravenous Q12H   Continuous Infusions:   LOS: 3 days   Time spent: 35 minutes  Carollee Herter, DO  [facility]  12/19/2022, 1:45 PM

## 2022-12-19 NOTE — Care Management Important Message (Signed)
Important Message  Patient Details  Name: Ricky LIDDY Sr. MRN: [id number] Date of Birth: [birth year]   Important Message Given:  Yes - Medicare IM     Dorena Bodo 12/19/2022, 2:37 PM

## 2022-12-20 ENCOUNTER — Encounter (HOSPITAL_COMMUNITY): Payer: Self-pay | Admitting: Internal Medicine

## 2022-12-20 DIAGNOSIS — D649 Anemia, unspecified: Secondary | ICD-10-CM | POA: Diagnosis not present

## 2022-12-20 DIAGNOSIS — I10 Essential (primary) hypertension: Secondary | ICD-10-CM | POA: Diagnosis not present

## 2022-12-20 DIAGNOSIS — E785 Hyperlipidemia, unspecified: Secondary | ICD-10-CM | POA: Diagnosis not present

## 2022-12-20 DIAGNOSIS — K922 Gastrointestinal hemorrhage, unspecified: Secondary | ICD-10-CM | POA: Diagnosis not present

## 2022-12-20 LAB — COMPREHENSIVE METABOLIC PANEL
ALT: 14 U/L (ref 0–44)
AST: 21 U/L (ref 15–41)
Albumin: 2.9 g/dL — ABNORMAL LOW (ref 3.5–5.0)
Alkaline Phosphatase: 69 U/L (ref 38–126)
Anion gap: 11 (ref 5–15)
BUN: 28 mg/dL — ABNORMAL HIGH (ref 8–23)
CO2: 21 mmol/L — ABNORMAL LOW (ref 22–32)
Calcium: 9.6 mg/dL (ref 8.9–10.3)
Chloride: 106 mmol/L (ref 98–111)
Creatinine, Ser: 1.72 mg/dL — ABNORMAL HIGH (ref 0.61–1.24)
GFR, Estimated: 40 mL/min — ABNORMAL LOW (ref >60)
Glucose, Bld: 143 mg/dL — ABNORMAL HIGH (ref 70–99)
Potassium: 4.1 mmol/L (ref 3.5–5.1)
Sodium: 138 mmol/L (ref 135–145)
Total Bilirubin: 0.7 mg/dL (ref 0.3–1.2)
Total Protein: 6.1 g/dL — ABNORMAL LOW (ref 6.5–8.1)

## 2022-12-20 LAB — CBC WITH DIFFERENTIAL/PLATELET
Abs Immature Granulocytes: 0.02 10*3/uL (ref 0.00–0.07)
Basophils Absolute: 0 10*3/uL (ref 0.0–0.1)
Basophils Relative: 0 %
Eosinophils Absolute: 0.2 10*3/uL (ref 0.0–0.5)
Eosinophils Relative: 4 %
HCT: 23.7 % — ABNORMAL LOW (ref 39.0–52.0)
Hemoglobin: 7.7 g/dL — ABNORMAL LOW (ref 13.0–17.0)
Immature Granulocytes: 0 %
Lymphocytes Relative: 23 %
Lymphs Abs: 1.3 10*3/uL (ref 0.7–4.0)
MCH: 30.6 pg (ref 26.0–34.0)
MCHC: 32.5 g/dL (ref 30.0–36.0)
MCV: 94 fL (ref 80.0–100.0)
Monocytes Absolute: 0.6 10*3/uL (ref 0.1–1.0)
Monocytes Relative: 10 %
Neutro Abs: 3.5 10*3/uL (ref 1.7–7.7)
Neutrophils Relative %: 63 %
Platelets: 234 10*3/uL (ref 150–400)
RBC: 2.52 MIL/uL — ABNORMAL LOW (ref 4.22–5.81)
RDW: 13.9 % (ref 11.5–15.5)
WBC: 5.6 10*3/uL (ref 4.0–10.5)
nRBC: 0 % (ref 0.0–0.2)

## 2022-12-20 LAB — PREPARE RBC (CROSSMATCH)

## 2022-12-20 MED ORDER — SODIUM CHLORIDE 0.9% IV SOLUTION: Freq: Once | INTRAVENOUS | Status: AC

## 2022-12-20 MED ORDER — SODIUM CHLORIDE 0.9 % IV SOLN: INTRAVENOUS | Status: DC

## 2022-12-20 MED ORDER — SODIUM CHLORIDE 0.9% FLUSH
3.0000 mL | Freq: Two times a day (BID) | INTRAVENOUS | Status: DC
  Administered 2022-12-20 – 2022-12-22 (×5): 3 mL via INTRAVENOUS

## 2022-12-20 NOTE — Progress Notes (Signed)
Ricky Panther Sr. 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 Bosie Clos and if bleeding continues probably repeat colonoscopy next as an outpatient  [facility]  office [phone] After 5PM or if no answer call [phone]

## 2022-12-20 NOTE — Plan of Care (Signed)

## 2022-12-20 NOTE — Progress Notes (Signed)
PROGRESS NOTE    Ricky Rowland  PNT:[id number] DOB: 1945-03-19 DOA: 12/16/2022 PCP: [name], MD  Subjective: Patient seen and examined. Pt completed his video capsule endoscopy. Awaiting GI report.  HgB stable. On RA. No chest pain  Pt was dizzy while taking a shower today. Pt noted to be orthostatic this AM with BP dropping to 86/53 while standing.  105/56 while sitting.  HgB stable at 7.7. was 7.9 yesterday. No further black stools  Pt still feels tired and fatigue.  2 Dtrs at bedside.   Hospital Course: HPI: Ricky Rowland. is a 77 y.o. male with medical history significant of recent GI bleed with workup performed on 924 with EGD who is on dual antiplatelet therapy at home, history of coronary artery disease status post recent PCI again in September of this year.  Patient has been on aspirin and Effient afterwards, hypertension, hyperlipidemia, MGUS, chronic kidney disease stage IIIb and hyperlipidemia who presented to the ER today with recurrent GI bleed.  Patient was evaluated previously for the GI bleed after he was on dual antiplatelet therapy as a result of his coronary artery disease.  The EGD that he had done did not show any source of bleeding.  His H&H was stable at that time and was placed back on his dual antiplatelet therapy.  Patient however is back here again with another episode of melena with subsequent drop in his hemoglobin.  At time of discharge hemoglobin was around 9.9 g.  It is now dropped to 6.7.  Patient reported seeing the melena again.  At this time therefore is being admitted for management of GI bleed.  Denied any nausea or vomiting.  Denied any bright red blood per rectum but mainly the melena.  Patient however is having some weakness and lightheadedness.  He is being admitted for further evaluation and treatment.   Has had recurrent melanotic stools.  Just had a heart attack at the beginning of September 2028.  Had a left heart cath.   Required stenting.  Has a drug-eluting stent in the mid LAD.  Was placed on dual antiplatelet.  He was discharged from the hospital on December 05, 2022.  At that time he was discharged to home on aspirin and Plavix.  His Brilinta had been stopped.  Presents now back with GI bleeding.  Received 1 unit of blood.  GI has been consulted.  Has not had a colonoscopy for at least 2 years.  Already had an EGD done last month that showed some mild gastritis in the antrum.  Significant Events: Admitted 12/16/2022 Repeat EGD 12-18-2022. No source of upper GI bleeding found 12-20-2022. Orthostatic this AM. Sitting BP 105/56. Standing BP 86/53. Pt transfused 1 unit PRBC  Significant Labs: Admitting HgB 6.7 g/dl  Significant Imaging Studies: Admitting CT abd shows 1. No CT evidence for acute intra-abdominal or pelvic abnormality. 2. Diverticular disease of the left colon without acute inflammatory process. 3. Cholelithiasis. 4. Sclerotic lesion at L4 consistent with metastatic disease. 5. Aortic atherosclerosis.  Antibiotic Therapy: Anti-infectives (From admission, onward)    Start     Dose/Rate Route Frequency Ordered Stop   12/18/22 1130  fluconazole (DIFLUCAN) tablet 100 mg        100 mg Oral Daily 12/18/22 1036 12/25/22 0959       Procedures: 1 unit PRBC transfused 12-17-2022 EGD 12-18-2022 Findings: Patchy, white plaques were found in the entire esophagus. Biopsy is contraindicated because was on plavix 2 days ago. A  widely patent Schatzki ring was found at the gastroesophageal junction. A 2 cm hiatal hernia was present. The entire examined stomach was normal. The cardia and gastric fundus were normal on retroflexion. The examined duodenum was normal. Using the endoscope, the video capsule enteroscope was advanced into the duodenal bulb. Impression:  - Esophageal plaques were found, suspicious for candidiasis. Biopsy is contraindicated. - Widely patent Schatzki ring. 2 cm hiatal hernia.  - Normal  stomach.- Normal examined duodenum.- Successful completion of the Video Capsule       Enteroscope placement.- No specimens collected.  Consultants: GI - [facility]    Assessment and Plan: * GI bleed Admitted for recurrent GI bleeding.  GI was consulted.  transfused with 1 unit of blood on 12-17-2022.  Placed on Protonix 40 mg IV twice daily.  Given that he just had a drug-eluting stent placed less than a month ago, he really needs to be on dual antiplatelet therapy.    12-18-2022. Had another negative EGD for source of bleeding. VCE performed. Awaiting results for pill enteroscopy. May need colonoscopy. 12-19-2022 awaiting VCE report. HgB stable. Pt may shower. Tolerating solid food. 12-20-2022 no further bleeding. Pt has orthostasis. Will give 1 unit PRBC. Pt already has hold orders on norvasc. Discussed with pt and dtr that if VCE negative for source of bleeding, then next step would probably be colonoscopy.  If that fails to show source of bleeding, then only option would be to restart Plavix while pt remains in the hospital and monitor HgB. Discussed repeatedly that frequent blood draws are a source of blood loss and anemia.  Symptomatic anemia Due to GI bleeding.  Status post 1 unit of packed cell transfusion.  Hemoglobin stable.  Was 6.7 g/dl prior to transfusion.  12-20-2022. Pt noted to have orthostatic hypotension the AM. Sitting BP 105/56. Standing BP 86/53. Will give 1 unit PRBC. Discussed with dtr repeatedly that more frequent checking of HgB will only make pt more anemic. Had to review this several times.  Ischemic cardiomyopathy Last EF of 40 to 45% in September 2024.  Not volume overloaded.  Currently not on DAPT due to recurrent GI bleeding. Will ask cardiology to weigh in. 12-18-2022 pt seen by cardiology. Due to pt being >1 month out from his PCI, cardiology has recommended single agent therapy with Plavix only. No ASA.  Essential hypertension Stable on 10 mg norvasc daily.  Continue to monitor. Hold for SBP <120  Dyslipidemia Stable.  On Repatha.  Zetia causes muscle pain and statins caused joint pain.  Malignant neoplasm of prostate (HCC) Stable.  Has already had XRT to his bony mets in his spine.  CKD stage 3a, GFR 45-59 ml/min (HCC) - baseline SCr 1.5-1.7 Stable.  Baseline creatinine 1.5-1.7.   DVT prophylaxis: SCDs Start: 12/17/22 0314     Code Status: Full Code Family Communication: discussed with pt and dtr at bedside Disposition Plan: return home Reason for continuing need for hospitalization: continued monitoring of HgB. PRBC transfusion today  Objective: Vitals:   12/19/22 2031 12/20/22 0412 12/20/22 0843 12/20/22 0846  BP: 128/64 131/70 (!) 105/56 (!) 86/53  Pulse: (!) 59 (!) 59 61 70  Resp:   18 20  Temp: 98.6 F (37 C) 98.5 F (36.9 C) 97.9 F (36.6 C) 97.8 F (36.6 C)  TempSrc: Oral Oral Oral Oral  SpO2: 99% 97% 100% 100%  Weight:      Height:       No intake or output data in the 24  hours ending 12/20/22 1107 Filed Weights   12/16/22 1809 12/17/22 1645  Weight: 93.9 kg 91.7 kg    Examination:  Physical Exam Vitals and nursing note reviewed.  Constitutional:      General: He is not in acute distress.    Appearance: He is not toxic-appearing or diaphoretic.  HENT:     Head: Normocephalic and atraumatic.     Nose: Nose normal.  Eyes:     General: No scleral icterus. Cardiovascular:     Rate and Rhythm: Normal rate and regular rhythm.  Pulmonary:     Effort: Pulmonary effort is normal.     Breath sounds: Normal breath sounds.  Abdominal:     General: Bowel sounds are normal. There is no distension.     Palpations: Abdomen is soft.  Skin:    General: Skin is warm and dry.     Capillary Refill: Capillary refill takes less than 2 seconds.  Neurological:     General: No focal deficit present.     Mental Status: He is alert and oriented to person, place, and time.     Data Reviewed: I have personally reviewed  following labs and imaging studies  CBC: Recent Labs  Lab 12/17/22 0419 12/17/22 0959 12/17/22 1500 12/17/22 2143 12/20/22 0515  WBC 6.4 7.5 6.6 7.0 5.6  NEUTROABS  --   --   --   --  3.5  HGB 7.4* 7.8* 7.8* 7.9* 7.7*  HCT 22.3* 23.9* 23.8* 23.8* 23.7*  MCV 93.3 94.8 95.6 93.0 94.0  PLT 211 223 227 229 234   Basic Metabolic Panel: Recent Labs  Lab 12/16/22 1825 12/17/22 0419 12/18/22 0743 12/20/22 0515  NA 135 137 138 138  K 4.0 3.7 4.1 4.1  CL 103 107 104 106  CO2 20* 21* 23 21*  GLUCOSE 107* 134* 145* 143*  BUN 41* 34* 22 28*  CREATININE 1.74* 1.62* 1.51* 1.72*  CALCIUM 9.5 9.6 9.9 9.6   GFR: Estimated Creatinine Clearance: 41.6 mL/min (A) (by C-G formula based on SCr of 1.72 mg/dL (H)). Liver Function Tests: Recent Labs  Lab 12/16/22 1825 12/17/22 0419 12/18/22 0743 12/20/22 0515  AST 18 16 20 21   ALT 14 13 13 14   ALKPHOS 70 61 71 69  BILITOT 0.4 0.6 1.0 0.7  PROT 6.8 6.2* 6.4* 6.1*  ALBUMIN 3.3* 2.9* 3.0* 2.9*   Radiology Studies: No results found.  Scheduled Meds:  sodium chloride   Intravenous Once   allopurinol  300 mg Oral Daily   amLODipine  10 mg Oral Daily   enzalutamide  160 mg Oral Daily   fluconazole  100 mg Oral Daily   melatonin  3 mg Oral QHS   pantoprazole (PROTONIX) IV  40 mg Intravenous Q24H   relugolix  120 mg Oral Daily   sodium chloride flush  3 mL Intravenous Q12H   Continuous Infusions:   LOS: 4 days   Time spent: 45 minutes  Carollee Herter, DO  [facility]  12/20/2022, 11:07 AM

## 2022-12-21 ENCOUNTER — Inpatient Hospital Stay (HOSPITAL_COMMUNITY): Payer: Medicare Other | Admitting: Certified Registered Nurse Anesthetist

## 2022-12-21 ENCOUNTER — Encounter (HOSPITAL_COMMUNITY)
Admission: EM | Disposition: A | Discharge status: Home / Self Care | Payer: Self-pay | Source: Home / Self Care | Attending: Internal Medicine

## 2022-12-21 ENCOUNTER — Encounter (HOSPITAL_COMMUNITY): Payer: Self-pay | Admitting: Internal Medicine

## 2022-12-21 DIAGNOSIS — D649 Anemia, unspecified: Secondary | ICD-10-CM | POA: Diagnosis not present

## 2022-12-21 DIAGNOSIS — K31811 Angiodysplasia of stomach and duodenum with bleeding: Secondary | ICD-10-CM

## 2022-12-21 DIAGNOSIS — K222 Esophageal obstruction: Secondary | ICD-10-CM

## 2022-12-21 DIAGNOSIS — I10 Essential (primary) hypertension: Secondary | ICD-10-CM | POA: Diagnosis not present

## 2022-12-21 DIAGNOSIS — I2510 Atherosclerotic heart disease of native coronary artery without angina pectoris: Secondary | ICD-10-CM

## 2022-12-21 DIAGNOSIS — K5520 Angiodysplasia of colon without hemorrhage: Secondary | ICD-10-CM

## 2022-12-21 HISTORY — PX: ENTEROSCOPY: SHX5533

## 2022-12-21 HISTORY — PX: HOT HEMOSTASIS: SHX5433

## 2022-12-21 LAB — TYPE AND SCREEN
ABO/RH(D): A POS
Antibody Screen: NEGATIVE
Unit division: 0

## 2022-12-21 LAB — BPAM RBC
Blood Product Expiration Date: 202411102359
ISSUE DATE / TIME: 202410121543
Unit Type and Rh: 6200

## 2022-12-21 SURGERY — EGD, WITH ARGON PLASMA COAGULATION
Anesthesia: Monitor Anesthesia Care

## 2022-12-21 MED ORDER — PROPOFOL 10 MG/ML IV BOLUS
INTRAVENOUS | Status: DC | PRN
  Administered 2022-12-21 (×3): 20 mg via INTRAVENOUS
  Administered 2022-12-21: 30 mg via INTRAVENOUS
  Administered 2022-12-21: 20 mg via INTRAVENOUS
  Administered 2022-12-21: 40 mg via INTRAVENOUS
  Administered 2022-12-21: 20 mg via INTRAVENOUS

## 2022-12-21 MED ORDER — SODIUM CHLORIDE 0.9 % IV SOLN
INTRAVENOUS | Status: DC | PRN
Start: 2022-12-21 — End: 2022-12-21

## 2022-12-21 MED ORDER — PHENYLEPHRINE 80 MCG/ML (10ML) SYRINGE FOR IV PUSH (FOR BLOOD PRESSURE SUPPORT)
PREFILLED_SYRINGE | INTRAVENOUS | Status: DC | PRN
Start: 2022-12-21 — End: 2022-12-21
  Administered 2022-12-21: 80 ug via INTRAVENOUS
  Administered 2022-12-21: 160 ug via INTRAVENOUS

## 2022-12-21 MED ORDER — PANTOPRAZOLE SODIUM 40 MG PO TBEC
40.0000 mg | DELAYED_RELEASE_TABLET | Freq: Every day | ORAL | Status: DC
  Administered 2022-12-22: 40 mg via ORAL
  Filled 2022-12-21: qty 1

## 2022-12-21 MED ORDER — PROPOFOL 500 MG/50ML IV EMUL
INTRAVENOUS | Status: DC | PRN
  Administered 2022-12-21: 80 ug/kg/min via INTRAVENOUS

## 2022-12-21 MED ORDER — LIDOCAINE HCL (CARDIAC) PF 100 MG/5ML IV SOSY
PREFILLED_SYRINGE | INTRAVENOUS | Status: DC | PRN
  Administered 2022-12-21: 100 mg via INTRAVENOUS

## 2022-12-21 NOTE — Progress Notes (Signed)
[name] 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

## 2022-12-21 NOTE — Assessment & Plan Note (Signed)
12-21-2022. 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 Taken for enteroscopy that cauterized 5 different AVMs.

## 2022-12-21 NOTE — Progress Notes (Signed)
PROGRESS NOTE    Fonzie Asare  GMW:[id number] DOB: May 23, 1945 DOA: 12/16/2022 PCP: [name], MD  Subjective: Patient seen and examined.  Pt VCE showed AVMs in small bowel. Pt underwent enteroscopy this AM. 5 separate lesions were cauterized with laser per endo report.  GI says that plavix can be restarted in 3 days. Plan on keeping pt overnight. Restart plavix on Thursday, October 17. Repeat HgB October 18, 21 and 28th with PCP.   Hospital Course: HPI: Quincy Gennuso. is a 77 y.o. male with medical history significant of recent GI bleed with workup performed on 924 with EGD who is on dual antiplatelet therapy at home, history of coronary artery disease status post recent PCI again in September of this year.  Patient has been on aspirin and Effient afterwards, hypertension, hyperlipidemia, MGUS, chronic kidney disease stage IIIb and hyperlipidemia who presented to the ER today with recurrent GI bleed.  Patient was evaluated previously for the GI bleed after he was on dual antiplatelet therapy as a result of his coronary artery disease.  The EGD that he had done did not show any source of bleeding.  His H&H was stable at that time and was placed back on his dual antiplatelet therapy.  Patient however is back here again with another episode of melena with subsequent drop in his hemoglobin.  At time of discharge hemoglobin was around 9.9 g.  It is now dropped to 6.7.  Patient reported seeing the melena again.  At this time therefore is being admitted for management of GI bleed.  Denied any nausea or vomiting.  Denied any bright red blood per rectum but mainly the melena.  Patient however is having some weakness and lightheadedness.  He is being admitted for further evaluation and treatment.   Has had recurrent melanotic stools.  Just had a heart attack at the beginning of September 2028.  Had a left heart cath.  Required stenting.  Has a drug-eluting stent in the mid LAD.  Was  placed on dual antiplatelet.  He was discharged from the hospital on December 05, 2022.  At that time he was discharged to home on aspirin and Plavix.  His Brilinta had been stopped.  Presents now back with GI bleeding.  Received 1 unit of blood.  GI has been consulted.  Has not had a colonoscopy for at least 2 years.  Already had an EGD done last month that showed some mild gastritis in the antrum.  Significant Events: Admitted 12/16/2022 Repeat EGD 12-18-2022. No source of upper GI bleeding found 12-20-2022. Orthostatic this AM. Sitting BP 105/56. Standing BP 86/53. Pt transfused 1 unit PRBC  Significant Labs: Admitting HgB 6.7 g/dl HgB 7.4 after 1 unit PRBC transfusion  Significant Imaging Studies: Admitting CT abd shows 1. No CT evidence for acute intra-abdominal or pelvic abnormality. 2. Diverticular disease of the left colon without acute inflammatory process. 3. Cholelithiasis. 4. Sclerotic lesion at L4 consistent with metastatic disease. 5. Aortic atherosclerosis.  Antibiotic Therapy: Anti-infectives (From admission, onward)    Start     Dose/Rate Route Frequency Ordered Stop   12/18/22 1130  fluconazole (DIFLUCAN) tablet 100 mg        100 mg Oral Daily 12/18/22 1036 12/25/22 0959       Procedures: 1 unit PRBC transfused 12-17-2022 EGD 12-18-2022 Findings: Patchy, white plaques were found in the entire esophagus. Biopsy is contraindicated because was on plavix 2 days ago. A widely patent Schatzki ring was found at  the gastroesophageal junction. A 2 cm hiatal hernia was present. The entire examined stomach was normal. The cardia and gastric fundus were normal on retroflexion. The examined duodenum was normal. Using the endoscope, the video capsule enteroscope was advanced into the duodenal bulb. Impression:  - Esophageal plaques were found, suspicious for candidiasis. Biopsy is contraindicated. - Widely patent Schatzki ring. 2 cm hiatal hernia.  - Normal stomach.- Normal examined  duodenum.- Successful completion of the Video Capsule       Enteroscope placement.- No specimens collected. Enteroscopy 12-21-2022 A small hiatal hernia was present.  Diffuse mild inflammation characterized by congestion (edema) and granularity was found in the entire examined stomach.  Two angioectasias with no bleeding were found in the second portion of the duodenum and in the fourth portion of the duodenum. Fulguration to ablate the lesion to prevent bleeding by argon plasma at 2 liters/minute and 20 watts was successful. Two angioectasias with no bleeding were found in the proximal jejunum and in the mid-jejunum. Fulguration to ablate the lesion to prevent bleeding by argon plasma   Consultants: GI - [facility]    Assessment and Plan: * GI bleed Admitted for recurrent GI bleeding.  GI was consulted.  transfused with 1 unit of blood on 12-17-2022.  Placed on Protonix 40 mg IV twice daily.  Given that he just had a drug-eluting stent placed less than a month ago, he really needs to be on dual antiplatelet therapy.    12-18-2022. Had another negative EGD for source of bleeding. VCE performed. Awaiting results for pill enteroscopy. May need colonoscopy. 12-19-2022 awaiting VCE report. HgB stable. Pt may shower. Tolerating solid food. 12-20-2022 no further bleeding. Pt has orthostasis. Will give 1 unit PRBC. Pt already has hold orders on norvasc. Discussed with pt and dtr that if VCE negative for source of bleeding, then next step would probably be colonoscopy.  If that fails to show source of bleeding, then only option would be to restart Plavix while pt remains in the hospital and monitor HgB. Discussed repeatedly that frequent blood draws are a source of blood loss and anemia. 12-21-2022 VCE showed small bowel AVMs. 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 Taken for enteroscopy  that cauterized 5 different AVMs.  Symptomatic anemia Due to GI bleeding.  Status post 1 unit of packed cell transfusion.  Hemoglobin stable.  Was 6.7 g/dl prior to transfusion.  12-20-2022. Pt noted to have orthostatic hypotension the AM. Sitting BP 105/56. Standing BP 86/53. Will give 1 unit PRBC. Discussed with dtr repeatedly that more frequent checking of HgB will only make pt more anemic. Had to review this several times.  Ischemic cardiomyopathy Last EF of 40 to 45% in September 2024.  Not volume overloaded.  Currently not on DAPT due to recurrent GI bleeding. Will ask cardiology to weigh in. 12-18-2022 pt seen by cardiology. Due to pt being >1 month out from his PCI, cardiology has recommended single agent therapy with Plavix only. No ASA.  Essential hypertension Stable on 10 mg norvasc daily. Continue to monitor. Hold for SBP <120  Dyslipidemia Stable.  On Repatha.  Zetia causes muscle pain and statins caused joint pain.  Malignant neoplasm of prostate (HCC) Stable.  Has already had XRT to his bony mets in his spine.  CKD stage 3a, GFR 45-59 ml/min (HCC) - baseline SCr 1.5-1.7 Stable.  Baseline creatinine 1.5-1.7.   DVT  prophylaxis: SCDs Start: 12/17/22 0314    Code Status: Full Code Family Communication: no family at bedside. Pt is decisional Disposition Plan: return home Reason for continuing need for hospitalization: monitor CBC. Plan for DC in AM.  Objective: Vitals:   12/21/22 0805 12/21/22 0815 12/21/22 0825 12/21/22 0848  BP: 97/83 (!) 124/57 (!) 127/50 (!) 146/66  Pulse: (!) 51 (!) 51 (!) 50 (!) 57  Resp: 17 13 14 16   Temp: (!) 97.2 F (36.2 C)   98.4 F (36.9 C)  TempSrc: Temporal   Oral  SpO2: 94% 99% 98% 99%  Weight:      Height:        Intake/Output Summary (Last 24 hours) at 12/21/2022 1107 Last data filed at 12/21/2022 0757 Gross per 24 hour  Intake 399.42 ml  Output 460 ml  Net -60.58 ml   Filed Weights   12/16/22 1809 12/17/22 1645  12/21/22 0725  Weight: 93.9 kg 91.7 kg 91.7 kg    Examination:  Physical Exam Vitals and nursing note reviewed.  Constitutional:      General: He is not in acute distress.    Appearance: He is not toxic-appearing or diaphoretic.  HENT:     Head: Normocephalic and atraumatic.     Nose: Nose normal.  Eyes:     General: No scleral icterus. Cardiovascular:     Rate and Rhythm: Normal rate and regular rhythm.  Pulmonary:     Effort: Pulmonary effort is normal.     Breath sounds: Normal breath sounds.  Abdominal:     General: Bowel sounds are normal. There is no distension.     Palpations: Abdomen is soft.  Skin:    General: Skin is warm and dry.     Capillary Refill: Capillary refill takes less than 2 seconds.  Neurological:     General: No focal deficit present.     Mental Status: He is alert and oriented to person, place, and time.     Data Reviewed: I have personally reviewed following labs and imaging studies  CBC: Recent Labs  Lab 12/17/22 0419 12/17/22 0959 12/17/22 1500 12/17/22 2143 12/20/22 0515  WBC 6.4 7.5 6.6 7.0 5.6  NEUTROABS  --   --   --   --  3.5  HGB 7.4* 7.8* 7.8* 7.9* 7.7*  HCT 22.3* 23.9* 23.8* 23.8* 23.7*  MCV 93.3 94.8 95.6 93.0 94.0  PLT 211 223 227 229 234   Basic Metabolic Panel: Recent Labs  Lab 12/16/22 1825 12/17/22 0419 12/18/22 0743 12/20/22 0515  NA 135 137 138 138  K 4.0 3.7 4.1 4.1  CL 103 107 104 106  CO2 20* 21* 23 21*  GLUCOSE 107* 134* 145* 143*  BUN 41* 34* 22 28*  CREATININE 1.74* 1.62* 1.51* 1.72*  CALCIUM 9.5 9.6 9.9 9.6   GFR: Estimated Creatinine Clearance: 41.6 mL/min (A) (by C-G formula based on SCr of 1.72 mg/dL (H)). Liver Function Tests: Recent Labs  Lab 12/16/22 1825 12/17/22 0419 12/18/22 0743 12/20/22 0515  AST 18 16 20 21   ALT 14 13 13 14   ALKPHOS 70 61 71 69  BILITOT 0.4 0.6 1.0 0.7  PROT 6.8 6.2* 6.4* 6.1*  ALBUMIN 3.3* 2.9* 3.0* 2.9*    Radiology Studies: No results  found.  Scheduled Meds:  allopurinol  300 mg Oral Daily   amLODipine  10 mg Oral Daily   enzalutamide  160 mg Oral Daily   fluconazole  100 mg Oral Daily   melatonin  3 mg Oral QHS   pantoprazole (PROTONIX) IV  40 mg Intravenous Q24H   relugolix  120 mg Oral Daily   sodium chloride flush  3 mL Intravenous Q12H   Continuous Infusions:   LOS: 5 days   Time spent: 35 minutes  Carollee Herter, DO  [facility]  12/21/2022, 11:07 AM

## 2022-12-21 NOTE — Plan of Care (Signed)

## 2022-12-21 NOTE — Plan of Care (Signed)

## 2022-12-21 NOTE — Transfer of Care (Signed)
Immediate Anesthesia Transfer of Care Note  Patient: Ricky SAHAKIAN Sr.  Procedure(s) Performed: ESOPHAGOGASTRODUODENOSCOPY (EGD) HOT HEMOSTASIS (ARGON PLASMA COAGULATION/BICAP)  Patient Location: Endoscopy Unit  Anesthesia Type:MAC  Level of Consciousness: awake, alert , oriented, and patient cooperative  Airway & Oxygen Therapy: Patient Spontanous Breathing  Post-op Assessment: Report given to RN and Post -op Vital signs reviewed and stable  Post vital signs: Reviewed and stable  Last Vitals:  Vitals Value Taken Time  BP 146/66 12/21/22 0848  Temp 36.9 C 12/21/22 0848  Pulse 57 12/21/22 0848  Resp 16 12/21/22 0848  SpO2 99 % 12/21/22 0848    Last Pain:  Vitals:   12/21/22 0848  TempSrc: Oral  PainSc:          Complications: No notable events documented.

## 2022-12-21 NOTE — Anesthesia Postprocedure Evaluation (Signed)
Anesthesia Post Note  Patient: Ricky ACKERS Sr.  Procedure(s) Performed: ESOPHAGOGASTRODUODENOSCOPY (EGD) HOT HEMOSTASIS (ARGON PLASMA COAGULATION/BICAP)     Patient location during evaluation: PACU Anesthesia Type: MAC Level of consciousness: awake and alert Pain management: pain level controlled Vital Signs Assessment: post-procedure vital signs reviewed and stable Respiratory status: spontaneous breathing, nonlabored ventilation, respiratory function stable and patient connected to nasal cannula oxygen Cardiovascular status: stable and blood pressure returned to baseline Postop Assessment: no apparent nausea or vomiting Anesthetic complications: no   No notable events documented.  Last Vitals:  Vitals:   12/21/22 0825 12/21/22 0848  BP: (!) 127/50 (!) 146/66  Pulse: (!) 50 (!) 57  Resp: 14 16  Temp:  36.9 C  SpO2: 98% 99%    Last Pain:  Vitals:   12/21/22 0848  TempSrc: Oral  PainSc:                  Nelle Don Riddick [place]

## 2022-12-21 NOTE — Op Note (Signed)
[facility] Patient Name: Ricky Rowland Procedure Date : 12/21/2022 MRN: [id number] Attending MD: Vida Rigger , MD, [id number] Date of Birth: [birth year] CSN: [id number] Age: 77 Admit Type: Outpatient Procedure:                Small bowel enteroscopy Indications:              Angioectasia Providers:                Vida Rigger, MD, Eliberto Ivory, RN, Harrington Challenger,                            Technician Referring MD:              Medicines:                Monitored Anesthesia Care Complications:            No immediate complications. Estimated Blood Loss:     Estimated blood loss: none. Procedure:                Pre-Anesthesia Assessment:                           - Prior to the procedure, a History and Physical                            was performed, and patient medications and                            allergies were reviewed. The patient's tolerance of                            previous anesthesia was also reviewed. The risks                            and benefits of the procedure and the sedation                            options and risks were discussed with the patient.                            All questions were answered, and informed consent                            was obtained. Prior Anticoagulants: The patient has                            taken Plavix (clopidogrel), last dose was 5 days                            prior to procedure. ASA Grade Assessment: III - A                            patient with severe systemic disease. After  reviewing the risks and benefits, the patient was                            deemed in satisfactory condition to undergo the                            procedure.                           After obtaining informed consent, the endoscope was                            passed under direct vision. Throughout the                            procedure, the patient's blood pressure, pulse, and                             oxygen saturations were monitored continuously. The                            PCF-190TL ([id number]) Olympus colonoscope was                            introduced through the mouth and advanced to the                            mid-jejunum. The small bowel enteroscopy was                            accomplished without difficulty. The patient                            tolerated the procedure well. Scope In: Scope Out: Findings:      A small hiatal hernia was present.      Diffuse mild inflammation characterized by congestion (edema) and       granularity was found in the entire examined stomach.      Two angioectasias with no bleeding were found in the second portion of       the duodenum and in the fourth portion of the duodenum. Fulguration to       ablate the lesion to prevent bleeding by argon plasma at 2 liters/minute       and 20 watts was successful.      Two angioectasias with no bleeding were found in the proximal jejunum       and in the mid-jejunum. Fulguration to ablate the lesion to prevent       bleeding by argon plasma at 2 liters/minute and 20 watts was successful. Impression:               - Small hiatal hernia.                           - Chronic atrophic gastritis.                           -  Two non-bleeding angioectasias in the duodenum.                            Treated with argon plasma coagulation (APC).                           - Three non-bleeding angioectasias in the jejunum.                            Treated with argon plasma coagulation (APC).                           - No specimens collected. Recommendation:           - Soft diet today. Hopefully can go home soon                           - Return to GI clinic in 2-4 weeks. With primary                            gastroenterologist Dr. Bosie Clos to see if any                            further workup and plans are needed like either                            repeat colonoscopy or  double-balloon enteroscopy                           - Telephone GI clinic if symptomatic PRN. And                            please call us if we can be of any further                            assistance with this hospital stay                           - Continue present medications.                           - Resume Plavix (clopidogrel) at prior dose in 3                            days I believe cardiology is okay with single                            therapy and can hold aspirin but double check with                            them. Procedure Code(s):        --- Professional ---                           [phone], Small  intestinal endoscopy, enteroscopy                            beyond second portion of duodenum, not including                            ileum; with control of bleeding (eg, injection,                            bipolar cautery, unipolar cautery, laser, heater                            probe, stapler, plasma coagulator) Diagnosis Code(s):        --- Professional ---                           K44.9, Diaphragmatic hernia without obstruction or                            gangrene                           K29.40, Chronic atrophic gastritis without bleeding                           K31.819, Angiodysplasia of stomach and duodenum                            without bleeding                           K55.20, Angiodysplasia of colon without hemorrhage CPT copyright 2022 [employer]. All rights reserved. The codes documented in this report are preliminary and upon coder review may  be revised to meet current compliance requirements. Vida Rigger, MD 12/21/2022 8:17:59 AM This report has been signed electronically. Number of Addenda: 0

## 2022-12-21 NOTE — Anesthesia Preprocedure Evaluation (Addendum)
Anesthesia Evaluation  Patient identified by MRN, date of birth, ID band Patient awake    Reviewed: Allergy & Precautions, NPO status , Patient's Chart, lab work & pertinent test results  Airway Mallampati: II  TM Distance: >3 FB Neck ROM: Full    Dental no notable dental hx. (+) Partial Upper   Pulmonary former smoker   Pulmonary exam normal        Cardiovascular hypertension, Pt. on medications + CAD and + Past MI   Rhythm:Regular Rate:Normal     Neuro/Psych negative neurological ROS  negative psych ROS   GI/Hepatic Neg liver ROS,GERD  ,,  Endo/Other  negative endocrine ROS    Renal/GU   negative genitourinary   Musculoskeletal  (+) Arthritis , Osteoarthritis,    Abdominal Normal abdominal exam  (+)   Peds  Hematology  (+) Blood dyscrasia, anemia Lab Results      Component                Value               Date                      WBC                      5.6                 12/20/2022                HGB                      7.7 (L)             12/20/2022                HCT                      23.7 (L)            12/20/2022                MCV                      94.0                12/20/2022                PLT                      234                 12/20/2022              Anesthesia Other Findings   Reproductive/Obstetrics                             Anesthesia Physical Anesthesia Plan  ASA: 3  Anesthesia Plan: MAC   Post-op Pain Management:    Induction: Intravenous  PONV Risk Score and Plan: 1 and Propofol infusion and Treatment may vary due to age or medical condition  Airway Management Planned: Simple Face Mask and Nasal Cannula  Additional Equipment: None  Intra-op Plan:   Post-operative Plan:   Informed Consent: I have reviewed the patients History and Physical, chart, labs and discussed the procedure including the risks, benefits and alternatives for the  proposed anesthesia with the  patient or authorized representative who has indicated his/her understanding and acceptance.     Dental advisory given  Plan Discussed with: CRNA  Anesthesia Plan Comments:        Anesthesia Quick Evaluation

## 2022-12-22 ENCOUNTER — Other Ambulatory Visit (HOSPITAL_COMMUNITY): Payer: Self-pay

## 2022-12-22 DIAGNOSIS — K31811 Angiodysplasia of stomach and duodenum with bleeding: Secondary | ICD-10-CM | POA: Diagnosis not present

## 2022-12-22 DIAGNOSIS — I10 Essential (primary) hypertension: Secondary | ICD-10-CM | POA: Diagnosis not present

## 2022-12-22 DIAGNOSIS — K5520 Angiodysplasia of colon without hemorrhage: Secondary | ICD-10-CM | POA: Diagnosis not present

## 2022-12-22 DIAGNOSIS — D649 Anemia, unspecified: Secondary | ICD-10-CM | POA: Diagnosis not present

## 2022-12-22 LAB — COMPREHENSIVE METABOLIC PANEL
ALT: 13 U/L (ref 0–44)
AST: 17 U/L (ref 15–41)
Albumin: 3.2 g/dL — ABNORMAL LOW (ref 3.5–5.0)
Alkaline Phosphatase: 75 U/L (ref 38–126)
Anion gap: 11 (ref 5–15)
BUN: 24 mg/dL — ABNORMAL HIGH (ref 8–23)
CO2: 21 mmol/L — ABNORMAL LOW (ref 22–32)
Calcium: 9.9 mg/dL (ref 8.9–10.3)
Chloride: 105 mmol/L (ref 98–111)
Creatinine, Ser: 1.64 mg/dL — ABNORMAL HIGH (ref 0.61–1.24)
GFR, Estimated: 42 mL/min — ABNORMAL LOW (ref >60)
Glucose, Bld: 132 mg/dL — ABNORMAL HIGH (ref 70–99)
Potassium: 4 mmol/L (ref 3.5–5.1)
Sodium: 137 mmol/L (ref 135–145)
Total Bilirubin: 0.7 mg/dL (ref 0.3–1.2)
Total Protein: 7 g/dL (ref 6.5–8.1)

## 2022-12-22 LAB — CBC WITH DIFFERENTIAL/PLATELET
Abs Immature Granulocytes: 0.02 10*3/uL (ref 0.00–0.07)
Basophils Absolute: 0 10*3/uL (ref 0.0–0.1)
Basophils Relative: 0 %
Eosinophils Absolute: 0.1 10*3/uL (ref 0.0–0.5)
Eosinophils Relative: 2 %
HCT: 27.9 % — ABNORMAL LOW (ref 39.0–52.0)
Hemoglobin: 9.1 g/dL — ABNORMAL LOW (ref 13.0–17.0)
Immature Granulocytes: 0 %
Lymphocytes Relative: 22 %
Lymphs Abs: 1.6 10*3/uL (ref 0.7–4.0)
MCH: 30.4 pg (ref 26.0–34.0)
MCHC: 32.6 g/dL (ref 30.0–36.0)
MCV: 93.3 fL (ref 80.0–100.0)
Monocytes Absolute: 0.4 10*3/uL (ref 0.1–1.0)
Monocytes Relative: 5 %
Neutro Abs: 5.3 10*3/uL (ref 1.7–7.7)
Neutrophils Relative %: 71 %
Platelets: 290 10*3/uL (ref 150–400)
RBC: 2.99 MIL/uL — ABNORMAL LOW (ref 4.22–5.81)
RDW: 13.8 % (ref 11.5–15.5)
WBC: 7.5 10*3/uL (ref 4.0–10.5)
nRBC: 0 % (ref 0.0–0.2)

## 2022-12-22 LAB — MAGNESIUM: Magnesium: 1.7 mg/dL (ref 1.7–2.4)

## 2022-12-22 MED ORDER — CLOPIDOGREL BISULFATE 75 MG PO TABS
75.0000 mg | ORAL_TABLET | Freq: Every day | ORAL | 0 refills | Status: AC
  Filled 2022-12-22: qty 30, 30d supply, fill #0

## 2022-12-22 MED ORDER — FLUCONAZOLE 100 MG PO TABS
100.0000 mg | ORAL_TABLET | Freq: Every day | ORAL | 0 refills | Status: AC
  Filled 2022-12-22: qty 3, 3d supply, fill #0

## 2022-12-22 MED ORDER — PANTOPRAZOLE SODIUM 40 MG PO TBEC
40.0000 mg | DELAYED_RELEASE_TABLET | Freq: Two times a day (BID) | ORAL | 0 refills | Status: AC
Start: 2022-12-22 — End: 2024-07-29
  Filled 2022-12-22: qty 180, 90d supply, fill #0

## 2022-12-22 NOTE — Discharge Summary (Signed)
[facility] Discharge Summary   Patient name: Ricky THORNSBERRY Sr.  Admit date:     12/16/2022  Discharge date: 12/22/2022  Attending Physician: Rometta Emery [[id number]]  Discharge Physician: Carollee Herter   PCP: [name], MD  Admitted From: Home  Disposition:  Home  Recommendations for Outpatient Follow-up:  Follow up with PCP in this Friday December 26, 2022 for repeat CBC or HgB/Hct He will need orders for CBC or HgB/Hct on October 21 and January 05, 2023 Follow-up with [facility].  They should call patient for appointment Follow-up with [facility] cardiology in 4 weeks.  Please schedule this.   Home Health:No Equipment/Devices: None  Discharge Condition:Stable CODE STATUS:FULL Diet recommendation: Heart Healthy Fluid Restriction: None  Hospital Summary: HPI: Ricky Rowland. is a 77 y.o. male with medical history significant of recent GI bleed with workup performed on 924 with EGD who is on dual antiplatelet therapy at home, history of coronary artery disease status post recent PCI again in September of this year.  Patient has been on aspirin and Effient afterwards, hypertension, hyperlipidemia, MGUS, chronic kidney disease stage IIIb and hyperlipidemia who presented to the ER today with recurrent GI bleed.  Patient was evaluated previously for the GI bleed after he was on dual antiplatelet therapy as a result of his coronary artery disease.  The EGD that he had done did not show any source of bleeding.  His H&H was stable at that time and was placed back on his dual antiplatelet therapy.  Patient however is back here again with another episode of melena with subsequent drop in his hemoglobin.  At time of discharge hemoglobin was around 9.9 g.  It is now dropped to 6.7.  Patient reported seeing the melena again.  At this time therefore is being admitted for management of GI bleed.  Denied any nausea or vomiting.  Denied any bright red blood per rectum but mainly  the melena.  Patient however is having some weakness and lightheadedness.  He is being admitted for further evaluation and treatment.   Has had recurrent melanotic stools.  Just had a heart attack at the beginning of September 2028.  Had a left heart cath.  Required stenting.  Has a drug-eluting stent in the mid LAD.  Was placed on dual antiplatelet.  He was discharged from the hospital on December 05, 2022.  At that time he was discharged to home on aspirin and Plavix.  His Brilinta had been stopped.  Presents now back with GI bleeding.  Received 1 unit of blood.  GI has been consulted.  Has not had a colonoscopy for at least 2 years.  Already had an EGD done last month that showed some mild gastritis in the antrum.  Significant Events: Admitted 12/16/2022 Repeat EGD 12-18-2022. No source of upper GI bleeding found 12-20-2022. Orthostatic this AM. Sitting BP 105/56. Standing BP 86/53. Pt transfused 1 unit PRBC  Significant Labs: Admitting HgB 6.7 g/dl HgB 7.4 after 1 unit PRBC transfusion  Significant Imaging Studies: Admitting CT abd shows 1. No CT evidence for acute intra-abdominal or pelvic abnormality. 2. Diverticular disease of the left colon without acute inflammatory process. 3. Cholelithiasis. 4. Sclerotic lesion at L4 consistent with metastatic disease. 5. Aortic atherosclerosis.  Antibiotic Therapy: Anti-infectives (From admission, onward)    Start     Dose/Rate Route Frequency Ordered Stop   12/18/22 1130  fluconazole (DIFLUCAN) tablet 100 mg        100 mg Oral  Daily 12/18/22 1036 12/25/22 0959       Procedures: 1 unit PRBC transfused 12-17-2022 EGD 12-18-2022 Findings: Patchy, white plaques were found in the entire esophagus. Biopsy is contraindicated because was on plavix 2 days ago. A widely patent Schatzki ring was found at the gastroesophageal junction. A 2 cm hiatal hernia was present. The entire examined stomach was normal. The cardia and gastric fundus were normal on  retroflexion. The examined duodenum was normal. Using the endoscope, the video capsule enteroscope was advanced into the duodenal bulb. Impression:  - Esophageal plaques were found, suspicious for candidiasis. Biopsy is contraindicated. - Widely patent Schatzki ring. 2 cm hiatal hernia.  - Normal stomach.- Normal examined duodenum.- Successful completion of the Video Capsule       Enteroscope placement.- No specimens collected. Enteroscopy 12-21-2022 A small hiatal hernia was present.  Diffuse mild inflammation characterized by congestion (edema) and granularity was found in the entire examined stomach.  Two angioectasias with no bleeding were found in the second portion of the duodenum and in the fourth portion of the duodenum. Fulguration to ablate the lesion to prevent bleeding by argon plasma at 2 liters/minute and 20 watts was successful. Two angioectasias with no bleeding were found in the proximal jejunum and in the mid-jejunum. Fulguration to ablate the lesion to prevent bleeding by argon plasma   Consultants: GI [facility] Course by Problem: * GI bleed Admitted for recurrent GI bleeding.  GI was consulted.  transfused with 1 unit of blood on 12-17-2022.  Placed on Protonix 40 mg IV twice daily.  Given that he just had a drug-eluting stent placed less than a month ago, he really needs to be on dual antiplatelet therapy.    12-18-2022. Had another negative EGD for source of bleeding. VCE performed. Awaiting results for pill enteroscopy. May need colonoscopy. 12-19-2022 awaiting VCE report. HgB stable. Pt may shower. Tolerating solid food. 12-20-2022 no further bleeding. Pt has orthostasis. Will give 1 unit PRBC. Pt already has hold orders on norvasc. Discussed with pt and dtr that if VCE negative for source of bleeding, then next step would probably be colonoscopy.  If that fails to show source of bleeding, then only option would be to restart Plavix while pt remains in the hospital and  monitor HgB. Discussed repeatedly that frequent blood draws are a source of blood loss and anemia. 12-21-2022 VCE showed small bowel AVMs. 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  12-22-2022 HgB stable at 9.1. did receive 1 unit PRBC on 12-20-2022. Will dc today. Restart plavix on Thursday October 17. Will need repeat CBC or Hgb/Hct on Friday October 17, then October 21 and then October 28 to ensure he is not bleeding after starting plavix. Pt to f/u with [facility]. He if bleeds again, the only thing that has not been performed is colonoscopy because his description of his GI bleeding has always been melena and not bright red blood per rectum.  Patient also need to follow-up with cardiology for his PCI.  Cardiology saw the patient during his hospitalization and deemed him stable to only be treated with Plavix alone.  Will need follow-up with cardiology to decide how long he Cervene on Plavix alone.  His aspirin was stopped by cardiology.  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 Taken for enteroscopy that cauterized 5 different AVMs.  Symptomatic anemia Due to GI bleeding.  Status post 1 unit of  packed cell transfusion.  Hemoglobin stable.  Was 6.7 g/dl prior to transfusion.  12-20-2022. Pt noted to have orthostatic hypotension the AM. Sitting BP 105/56. Standing BP 86/53. Will give 1 unit PRBC. Discussed with dtr repeatedly that more frequent checking of HgB will only make pt more anemic. Had to review this several times.  12/22/22 patient has been transfused a total of 2 units of packed red blood cells during this hospitalization.Marland Kitchen  Discharge hemoglobin is 9.1 g/dL.   Ischemic cardiomyopathy Last EF of 40 to 45% in September 2024.  Not volume overloaded.  Currently not on DAPT due to recurrent GI bleeding. Will ask cardiology to weigh in. 12-18-2022 pt seen by cardiology. Due to pt being >1 month out from his PCI,  cardiology has recommended single agent therapy with Plavix only. No ASA.  Essential hypertension Stable on 10 mg norvasc daily. Continue to monitor. Hold for SBP <120  Dyslipidemia Stable.  On Repatha.  Zetia causes muscle pain and statins caused joint pain.  Malignant neoplasm of prostate (HCC) Stable.  Has already had XRT to his bony mets in his spine.  CKD stage 3a, GFR 45-59 ml/min (HCC) - baseline SCr 1.5-1.7 Stable.  Baseline creatinine 1.5-1.7.    Discharge Diagnoses:  Principal Problem:   GI bleed Active Problems:   Symptomatic anemia   AVM (arteriovenous malformation) of small bowel, acquired   Malignant neoplasm of prostate (HCC)   Dyslipidemia   Essential hypertension   Ischemic cardiomyopathy   CKD stage 3a, GFR 45-59 ml/min (HCC) - baseline SCr 1.5-1.7   Discharge Instructions  Discharge Instructions     Call MD for:   Complete by: As directed    If you see any dark/black/tarry stools or bright red blood in your stools.   Call MD for:  difficulty breathing, headache or visual disturbances   Complete by: As directed    Call MD for:  extreme fatigue   Complete by: As directed    Call MD for:  hives   Complete by: As directed    Call MD for:  persistant dizziness or light-headedness   Complete by: As directed    Call MD for:  persistant nausea and vomiting   Complete by: As directed    Call MD for:  redness, tenderness, or signs of infection (pain, swelling, redness, odor or green/yellow discharge around incision site)   Complete by: As directed    Call MD for:  temperature >100.4   Complete by: As directed    Diet - low sodium heart healthy   Complete by: As directed    Discharge instructions   Complete by: As directed    1. Follow up with your Dr. Nehemiah Settle with [name] in 1-2 weeks following discharge from hospital 2. You will need repeat CBC or Hemoglobin/Hematocrit on October 18, 21 and 28th to ensure that you are not bleeding after  restarting Plavix 3. Follow up with [facility] as scheduled. They should call you for appointment. If you do not hear from [facility], please call their office and schedule appointment to see Dr. Bosie Clos   Increase activity slowly   Complete by: As directed       Allergies as of 12/22/2022       Reactions   Ezetimibe Other (See Comments)   Muscle aches   Statins Other (See Comments)   Joint pain        Medication List     STOP taking these medications  aspirin EC 81 MG tablet       TAKE these medications    acetaminophen 650 MG CR tablet Commonly known as: TYLENOL Take 650 mg by mouth every 8 (eight) hours as needed for pain.   allopurinol 300 MG tablet Commonly known as: ZYLOPRIM Take 300 mg by mouth daily.   amLODipine 10 MG tablet Commonly known as: NORVASC Take 1 tablet (10 mg total) by mouth daily.   CLEAR EYES OP Place 1 drop into both eyes daily as needed (dry eyes).   clopidogrel 75 MG tablet Commonly known as: PLAVIX Take 1 tablet (75 mg total) by mouth daily. First Dose on Thursday, December 25, 2022 Start taking on: December 25, 2022 What changed:  additional instructions These instructions start on December 25, 2022. If you are unsure what to do until then, ask your doctor or other care provider.   docusate sodium 100 MG capsule Commonly known as: COLACE Take 2 capsules (200 mg total) by mouth daily as needed for mild constipation.   fluconazole 100 MG tablet Commonly known as: DIFLUCAN Take 1 tablet (100 mg total) by mouth daily for 3 days. Start taking on: December 23, 2022   MULTIVITAMIN ADULT EXTRA C PO Take 1 tablet by mouth daily.   nitroGLYCERIN 0.4 MG SL tablet Commonly known as: NITROSTAT Place 1 tablet (0.4 mg total) under the tongue every 5 (five) minutes as needed for chest pain (up to 3 doses).   Orgovyx 120 MG tablet Generic drug: relugolix Take 120 mg by mouth daily.   pantoprazole 40 MG tablet Commonly known as:  Protonix Take 1 tablet (40 mg total) by mouth 2 (two) times daily before a meal.   polyethylene glycol powder 17 GM/SCOOP powder Commonly known as: GLYCOLAX/MIRALAX Take 1 capful (17 g) with water by mouth daily as needed for mild constipation.   Repatha SureClick 140 MG/ML Soaj Generic drug: Evolocumab Inject 140 mg into the skin every 14 (fourteen) days.   tolnaftate 1 % cream Commonly known as: TINACTIN Apply 1 application topically daily as needed (jock itch).   Xtandi 40 MG capsule Generic drug: enzalutamide Take 160 mg by mouth daily.        Allergies  Allergen Reactions   Ezetimibe Other (See Comments)    Muscle aches   Statins Other (See Comments)    Joint pain    Discharge Exam: Vitals:   12/22/22 0606 12/22/22 0748  BP: (!) 149/71 107/69  Pulse: 60 62  Resp: 18 18  Temp: 98.1 F (36.7 C) 98 F (36.7 C)  SpO2: 99% 100%    Physical Exam Vitals and nursing note reviewed.  Constitutional:      General: He is not in acute distress.    Appearance: He is not toxic-appearing or diaphoretic.  HENT:     Head: Normocephalic and atraumatic.     Nose: Nose normal.  Eyes:     General: No scleral icterus. Cardiovascular:     Rate and Rhythm: Normal rate and regular rhythm.  Pulmonary:     Effort: Pulmonary effort is normal.     Breath sounds: Normal breath sounds.  Abdominal:     General: Bowel sounds are normal. There is no distension.     Palpations: Abdomen is soft.  Skin:    General: Skin is warm and dry.     Capillary Refill: Capillary refill takes less than 2 seconds.  Neurological:     General: No focal deficit present.     Mental  Status: He is alert and oriented to person, place, and time.     The results of significant diagnostics from this hospitalization (including imaging, microbiology, ancillary and laboratory) are listed below for reference.     Labs: BNP (last 3 results) Recent Labs    11/26/22 1253 12/03/22 0357 12/04/22 0612   BNP 205.5* 281.2* 303.5*   Basic Metabolic Panel: Recent Labs  Lab 12/16/22 1825 12/17/22 0419 12/18/22 0743 12/20/22 0515 12/22/22 0641  NA 135 137 138 138 137  K 4.0 3.7 4.1 4.1 4.0  CL 103 107 104 106 105  CO2 20* 21* 23 21* 21*  GLUCOSE 107* 134* 145* 143* 132*  BUN 41* 34* 22 28* 24*  CREATININE 1.74* 1.62* 1.51* 1.72* 1.64*  CALCIUM 9.5 9.6 9.9 9.6 9.9  MG  --   --   --   --  1.7   Liver Function Tests: Recent Labs  Lab 12/16/22 1825 12/17/22 0419 12/18/22 0743 12/20/22 0515 12/22/22 0641  AST 18 16 20 21 17   ALT 14 13 13 14 13   ALKPHOS 70 61 71 69 75  BILITOT 0.4 0.6 1.0 0.7 0.7  PROT 6.8 6.2* 6.4* 6.1* 7.0  ALBUMIN 3.3* 2.9* 3.0* 2.9* 3.2*   CBC: Recent Labs  Lab 12/17/22 0959 12/17/22 1500 12/17/22 2143 12/20/22 0515 12/22/22 0641  WBC 7.5 6.6 7.0 5.6 7.5  NEUTROABS  --   --   --  3.5 5.3  HGB 7.8* 7.8* 7.9* 7.7* 9.1*  HCT 23.9* 23.8* 23.8* 23.7* 27.9*  MCV 94.8 95.6 93.0 94.0 93.3  PLT 223 227 229 234 290   Sepsis Labs Recent Labs  Lab 12/17/22 1500 12/17/22 2143 12/20/22 0515 12/22/22 0641  WBC 6.6 7.0 5.6 7.5   Microbiology No results found for this or any previous visit (from the past 240 hour(s)).  Procedures/Studies: CT ABDOMEN PELVIS W CONTRAST  Result Date: 12/16/2022 CLINICAL DATA:  Abdomen pain anemia rectal bleeding, history of prostate cancer EXAM: CT ABDOMEN AND PELVIS WITH CONTRAST TECHNIQUE: Multidetector CT imaging of the abdomen and pelvis was performed using the standard protocol following bolus administration of intravenous contrast. RADIATION DOSE REDUCTION: This exam was performed according to the departmental dose-optimization program which includes automated exposure control, adjustment of the mA and/or kV according to patient size and/or use of iterative reconstruction technique. CONTRAST:  60mL OMNIPAQUE IOHEXOL 350 MG/ML SOLN COMPARISON:  PET CT 05/09/2022 FINDINGS: Lower chest: Lung bases demonstrate no acute  airspace disease. Mild coronary vascular calcification Hepatobiliary: No focal hepatic abnormality or biliary dilatation. Small gallstones. Pancreas: Unremarkable. No pancreatic ductal dilatation or surrounding inflammatory changes. Spleen: Normal in size without focal abnormality. Adrenals/Urinary Tract: Adrenal glands are unremarkable. Kidneys are normal, without renal calculi, focal lesion, or hydronephrosis. Bladder is unremarkable. Stomach/Bowel: Stomach is within normal limits. Appendix appears normal. No evidence of bowel wall thickening, distention, or inflammatory changes. Diverticular disease of the left colon Vascular/Lymphatic: Moderate aortic atherosclerosis. No aneurysm. No suspicious lymph nodes Reproductive: Fiducial markers in the prostate. Penile prosthesis with reservoir in the right anterior pelvis indenting the bladder Other: Negative for pelvic effusion or free air Musculoskeletal: No acute osseous abnormality. Sclerotic lesion involving the left pedicle and posterior arch at L4 consistent with metastatic disease. IMPRESSION: 1. No CT evidence for acute intra-abdominal or pelvic abnormality. 2. Diverticular disease of the left colon without acute inflammatory process. 3. Cholelithiasis. 4. Sclerotic lesion at L4 consistent with metastatic disease. 5. Aortic atherosclerosis. Aortic Atherosclerosis (ICD10-I70.0). Electronically Signed   By: Selena Batten  Jake Samples M.D.   On: 12/16/2022 21:47   CT Head Wo Contrast  Result Date: 12/01/2022 CLINICAL DATA:  Patient fell off the bed hitting left side of his head on the floor. EXAM: CT HEAD WITHOUT CONTRAST CT CERVICAL SPINE WITHOUT CONTRAST TECHNIQUE: Multidetector CT imaging of the head and cervical spine was performed following the standard protocol without intravenous contrast. Multiplanar CT image reconstructions of the cervical spine were also generated. RADIATION DOSE REDUCTION: This exam was performed according to the departmental dose-optimization  program which includes automated exposure control, adjustment of the mA and/or kV according to patient size and/or use of iterative reconstruction technique. COMPARISON:  Brain MRI 02/03/2015 FINDINGS: CT HEAD FINDINGS Brain: There is no evidence for acute hemorrhage, hydrocephalus, mass lesion, or abnormal extra-axial fluid collection. No definite CT evidence for acute infarction. Chronic lacunar infarct noted right basal ganglia. Vascular: No hyperdense vessel or unexpected calcification. Skull: No evidence for fracture. No worrisome lytic or sclerotic lesion. Sinuses/Orbits: The visualized paranasal sinuses and mastoid air cells are clear. Visualized portions of the globes and intraorbital fat are unremarkable. Other: None. CT CERVICAL SPINE FINDINGS Alignment: Straightening of normal cervical lordosis. No subluxation. Skull base and vertebrae: No acute fracture. No primary bone lesion or focal pathologic process. Soft tissues and spinal canal: No prevertebral fluid or swelling. No visible canal hematoma. Disc levels: Intervertebral disc spaces are preserved. The facets are well aligned bilaterally. Upper chest: Unremarkable. Other: None. IMPRESSION: 1. No acute intracranial abnormality. 2. Chronic lacunar infarct right basal ganglia. 3. Loss of cervical lordosis. This can be related to patient positioning, muscle spasm or soft tissue injury. Electronically Signed   By: Kennith Center M.D.   On: 12/01/2022 13:03   CT Cervical Spine Wo Contrast  Result Date: 12/01/2022 CLINICAL DATA:  Patient fell off the bed hitting left side of his head on the floor. EXAM: CT HEAD WITHOUT CONTRAST CT CERVICAL SPINE WITHOUT CONTRAST TECHNIQUE: Multidetector CT imaging of the head and cervical spine was performed following the standard protocol without intravenous contrast. Multiplanar CT image reconstructions of the cervical spine were also generated. RADIATION DOSE REDUCTION: This exam was performed according to the  departmental dose-optimization program which includes automated exposure control, adjustment of the mA and/or kV according to patient size and/or use of iterative reconstruction technique. COMPARISON:  Brain MRI 02/03/2015 FINDINGS: CT HEAD FINDINGS Brain: There is no evidence for acute hemorrhage, hydrocephalus, mass lesion, or abnormal extra-axial fluid collection. No definite CT evidence for acute infarction. Chronic lacunar infarct noted right basal ganglia. Vascular: No hyperdense vessel or unexpected calcification. Skull: No evidence for fracture. No worrisome lytic or sclerotic lesion. Sinuses/Orbits: The visualized paranasal sinuses and mastoid air cells are clear. Visualized portions of the globes and intraorbital fat are unremarkable. Other: None. CT CERVICAL SPINE FINDINGS Alignment: Straightening of normal cervical lordosis. No subluxation. Skull base and vertebrae: No acute fracture. No primary bone lesion or focal pathologic process. Soft tissues and spinal canal: No prevertebral fluid or swelling. No visible canal hematoma. Disc levels: Intervertebral disc spaces are preserved. The facets are well aligned bilaterally. Upper chest: Unremarkable. Other: None. IMPRESSION: 1. No acute intracranial abnormality. 2. Chronic lacunar infarct right basal ganglia. 3. Loss of cervical lordosis. This can be related to patient positioning, muscle spasm or soft tissue injury. Electronically Signed   By: Kennith Center M.D.   On: 12/01/2022 13:03   DG Chest Portable 1 View  Result Date: 12/01/2022 CLINICAL DATA:  Shortness of breath. EXAM: PORTABLE CHEST  1 VIEW COMPARISON:  11/14/2022 FINDINGS: The lungs are clear without focal pneumonia, edema, pneumothorax or pleural effusion. Cardiopericardial silhouette is at upper limits of normal for size. No acute bony abnormality. Telemetry leads overlie the chest. IMPRESSION: No active disease. Electronically Signed   By: Kennith Center M.D.   On: 12/01/2022 12:58     Time coordinating discharge: 45 mins  SIGNED:  Carollee Herter, DO [facility] 12/22/22, 12:31 PM

## 2022-12-23 ENCOUNTER — Encounter (HOSPITAL_COMMUNITY): Payer: Self-pay | Admitting: Gastroenterology

## 2022-12-23 NOTE — Progress Notes (Signed)
ADVANCED HEART FAILURE CLINIC NOTE  Referring Physician: [name], MD  Primary Care: [name], MD Primary Cardiologist:  HPI: Ricky Rowland. is a 77 y.o. male with NSTEMI s/p PCI to the LAD on 11/16/22, hx of MGUS, hx of prostate cancer s/p radiotherapy on Xtandi, recurrent GI bleeds s/p EGD and capsule endoscopy without clear source of bleeding and HFrEF (LVEF 40-45%) presenting today to establish care.    Activity level/exercise tolerance:  *** Orthopnea:  Sleeps on *** pillows Paroxysmal noctural dyspnea:  *** Chest pain/pressure:  *** Orthostatic lightheadedness:  *** Palpitations:  *** Lower extremity edema:  *** Presyncope/syncope:  *** Cough:  ***  Past Medical History:  Diagnosis Date   Acute kidney injury superimposed on chronic kidney disease (HCC) 11/19/2022   Borderline diabetes    DIET CONTROLLED   Bradycardia 11/17/2022   Dyslipidemia    Elevated PSA    External carotid artery stenosis    MILD-- BILATERAL   Fall 11/05/2017   GERD (gastroesophageal reflux disease)    Gout, arthritis    Hearing loss in left ear    Heart murmur    MILD --  ASYMPTOMATIC   History of gastric ulcer    REMOTE HX   Hypertension    Pre-diabetes    Prostate cancer (HCC) 12/21/2012   Gleason 3+4=7   Seizure-like activity (HCC)    Syncope and collapse    Wears glasses     Current Outpatient Medications  Medication Sig Dispense Refill   acetaminophen (TYLENOL) 650 MG CR tablet Take 650 mg by mouth every 8 (eight) hours as needed for pain.     allopurinol (ZYLOPRIM) 300 MG tablet Take 300 mg by mouth daily.     amLODipine (NORVASC) 10 MG tablet Take 1 tablet (10 mg total) by mouth daily. 30 tablet 0   [START ON 12/25/2022] clopidogrel (PLAVIX) 75 MG tablet Take 1 tablet (75 mg total) by mouth daily. First Dose on Thursday, December 25, 2022 30 tablet 0   docusate sodium (COLACE) 100 MG capsule Take 2 capsules (200 mg total) by mouth daily as needed for mild  constipation. 100 capsule 0   fluconazole (DIFLUCAN) 100 MG tablet Take 1 tablet (100 mg total) by mouth daily for 3 days. 3 tablet 0   Multiple Vitamins-Minerals (MULTIVITAMIN ADULT EXTRA C PO) Take 1 tablet by mouth daily.     Naphazoline HCl (CLEAR EYES OP) Place 1 drop into both eyes daily as needed (dry eyes).      nitroGLYCERIN (NITROSTAT) 0.4 MG SL tablet Place 1 tablet (0.4 mg total) under the tongue every 5 (five) minutes as needed for chest pain (up to 3 doses). 25 tablet 3   ORGOVYX 120 MG tablet Take 120 mg by mouth daily.     pantoprazole (PROTONIX) 40 MG tablet Take 1 tablet (40 mg total) by mouth 2 (two) times daily before a meal. 180 tablet 0   polyethylene glycol powder (GLYCOLAX/MIRALAX) 17 GM/SCOOP powder Take 1 capful (17 g) with water by mouth daily as needed for mild constipation. 238 g 0   REPATHA SURECLICK 140 MG/ML SOAJ Inject 140 mg into the skin every 14 (fourteen) days.      tolnaftate (TINACTIN) 1 % cream Apply 1 application topically daily as needed (jock itch).     XTANDI 40 MG capsule Take 160 mg by mouth daily.     No current facility-administered medications for this visit.    Allergies  Allergen Reactions   Ezetimibe  Other (See Comments)    Muscle aches   Statins Other (See Comments)    Joint pain      Social History   Socioeconomic History   Marital status: Married    Spouse name: geraldine   Number of children: 3   Years of education: some college   Highest education level: High school graduate  Occupational History   Occupation: retired  Tobacco Use   Smoking status: Former    Current packs/day: 0.00    Average packs/day: 1 pack/day for 30.0 years (30.0 ttl pk-yrs)    Types: Cigarettes    Start date: 12/16/1967    Quit date: 12/15/1997    Years since quitting: 25.0   Smokeless tobacco: Never  Vaping Use   Vaping status: Never Used  Substance and Sexual Activity   Alcohol use: Not Currently    Alcohol/week: 20.0 standard drinks of  alcohol    Types: 20 Shots of liquor per week    Comment: OCCASIONAL, 12/28/17 stopped 11/05/17   Drug use: No   Sexual activity: Not on file  Other Topics Concern   Not on file  Social History Narrative   Lives with wife at home    caffeine use- occas, mostly decaf coffee   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    Social Determinants of Health   Financial Resource Strain: Low Risk  (11/18/2022)   Overall Financial Resource Strain (CARDIA)    Difficulty of Paying Living Expenses: Not hard at all  Food Insecurity: No Food Insecurity (12/17/2022)   Hunger Vital Sign    Worried About Running Out of Food in the Last Year: Never true    Ran Out of Food in the Last Year: Never true  Transportation Needs: No Transportation Needs (12/17/2022)   PRAPARE - Administrator, Civil Service (Medical): No    Lack of Transportation (Non-Medical): No  Physical Activity: Not on file  Stress: Not on file  Social Connections: Not on file  Intimate Partner Violence: Not At Risk (12/17/2022)   Humiliation, Afraid, Rape, and Kick questionnaire    Fear of Current or Ex-Partner: No    Emotionally Abused: No    Physically Abused: No    Sexually Abused: No      Family History  Problem Relation Age of Onset   Heart disease Mother    Heart disease Father    Cancer Sister        lung   Cancer Brother        prostate    PHYSICAL EXAM: There were no vitals filed for this visit. GENERAL: Well nourished, well developed, and in no apparent distress at rest.  HEENT: Negative for arcus senilis or xanthelasma. There is no scleral icterus.  The mucous membranes are pink and moist.   NECK: Supple, No masses. Normal carotid upstrokes without bruits. No masses or thyromegaly.    CHEST: There are no chest wall deformities. There is no chest wall tenderness. Respirations are unlabored.  Lungs- *** CARDIAC:  JVP: *** cm H2O         {HEART SOUNDS:22645}  Normal rate with regular rhythm. No murmurs, rubs or gallops.  Pulses  are 2+ and symmetrical in upper and lower extremities. *** edema.  ABDOMEN: Soft, non-tender, non-distended. There are no masses or hepatomegaly. There are normal bowel sounds.  EXTREMITIES: Warm and well perfused with no cyanosis, clubbing.  LYMPHATIC: No axillary or supraclavicular lymphadenopathy.  NEUROLOGIC: Patient is oriented x3 with no focal or lateralizing neurologic  deficits.  PSYCH: Patients affect is appropriate, there is no evidence of anxiety or depression.  SKIN: Warm and dry; no lesions or wounds.   DATA REVIEW  ECG: 12/18/22: Sinus bradycardia  As per my personal interpretation  ECHO: 11/15/22: LVEF 40-45%, normal RV function  CATH: 11/16/22:  LM: 20% disease LAD: Prox-mid 95% calcific stenosis (culprit lesion)          Mid 30% disease Lcx: Prox 50% stenosis        OM1 50% stenosis RCA: Prox CTO. 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 CAD - CTO of the RCA - S/P PCI to the RCA - continue single antiplatelet with plavix  3. 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  4. UJW1X - Repeat labs today   5. 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

## 2022-12-24 ENCOUNTER — Encounter (HOSPITAL_COMMUNITY): Payer: Self-pay | Admitting: Cardiology

## 2022-12-24 ENCOUNTER — Ambulatory Visit (HOSPITAL_COMMUNITY)
Admission: RE | Admit: 2022-12-24 | Discharge: 2022-12-24 | Disposition: A | Discharge status: Home / Self Care | Payer: Medicare Other | Source: Ambulatory Visit | Attending: Cardiology | Admitting: Cardiology

## 2022-12-24 VITALS — BP 162/84 | HR 68 | Wt 207.8 lb

## 2022-12-24 DIAGNOSIS — E785 Hyperlipidemia, unspecified: Secondary | ICD-10-CM | POA: Diagnosis not present

## 2022-12-24 DIAGNOSIS — I2510 Atherosclerotic heart disease of native coronary artery without angina pectoris: Secondary | ICD-10-CM | POA: Diagnosis not present

## 2022-12-24 DIAGNOSIS — I255 Ischemic cardiomyopathy: Secondary | ICD-10-CM | POA: Insufficient documentation

## 2022-12-24 DIAGNOSIS — Z8546 Personal history of malignant neoplasm of prostate: Secondary | ICD-10-CM | POA: Diagnosis not present

## 2022-12-24 DIAGNOSIS — N1831 Chronic kidney disease, stage 3a: Secondary | ICD-10-CM | POA: Diagnosis not present

## 2022-12-24 DIAGNOSIS — Z9861 Coronary angioplasty status: Secondary | ICD-10-CM

## 2022-12-24 DIAGNOSIS — K31811 Angiodysplasia of stomach and duodenum with bleeding: Secondary | ICD-10-CM

## 2022-12-24 DIAGNOSIS — I252 Old myocardial infarction: Secondary | ICD-10-CM | POA: Diagnosis not present

## 2022-12-24 DIAGNOSIS — I10 Essential (primary) hypertension: Secondary | ICD-10-CM | POA: Diagnosis not present

## 2022-12-24 DIAGNOSIS — Z79899 Other long term (current) drug therapy: Secondary | ICD-10-CM | POA: Diagnosis not present

## 2022-12-24 DIAGNOSIS — Z923 Personal history of irradiation: Secondary | ICD-10-CM | POA: Diagnosis not present

## 2022-12-24 DIAGNOSIS — Z955 Presence of coronary angioplasty implant and graft: Secondary | ICD-10-CM | POA: Insufficient documentation

## 2022-12-24 DIAGNOSIS — I5022 Chronic systolic (congestive) heart failure: Secondary | ICD-10-CM | POA: Diagnosis present

## 2022-12-24 DIAGNOSIS — Z7902 Long term (current) use of antithrombotics/antiplatelets: Secondary | ICD-10-CM | POA: Diagnosis not present

## 2022-12-24 DIAGNOSIS — I130 Hypertensive heart and chronic kidney disease with heart failure and stage 1 through stage 4 chronic kidney disease, or unspecified chronic kidney disease: Secondary | ICD-10-CM | POA: Diagnosis not present

## 2022-12-24 DIAGNOSIS — R55 Syncope and collapse: Secondary | ICD-10-CM

## 2022-12-24 MED ORDER — LOSARTAN POTASSIUM 25 MG PO TABS: 25.0000 mg | ORAL_TABLET | Freq: Every day | ORAL | 3 refills | Status: DC

## 2022-12-24 NOTE — Patient Instructions (Signed)
Medication Changes:  STOP: AMLODIPINE   START: LOSARTAN 25MG  ONCE DAILY   Lab Work:  RETURN FOR LABS IN 1 WEEK AS SCHEDULED   Follow-Up in: 2 months as scheduled   Then in 6 months PLEASE CALL OUR OFFICE AROUND FEBRUARY 2025 TO GET SCHEDULED FOR YOUR APPOINTMENT. PHONE NUMBER IS [phone] OPTION 2    At the [facility], you and your health needs are our priority. We have a designated team specialized in the treatment of Heart Failure. This Care Team includes your primary Heart Failure Specialized Cardiologist (physician), Advanced Practice Providers (APPs- Physician Assistants and Nurse Practitioners), and Pharmacist who all work together to provide you with the care you need, when you need it.   You may see any of the following providers on your designated Care Team at your next follow up:  Dr. Arvilla Meres Dr. Marca Ancona Dr. Dorthula Nettles Dr. Theresia Bough Tonye Becket, NP Robbie Lis, [place] [facility] [place], [place] Brynda Peon, NP [place] Lee, NP Karle Plumber, PharmD   Please be sure to bring in all your medications bottles to every appointment.   Need to Contact us:  If you have any questions or concerns before your next appointment please send [place] a message through [place] or call our office at [phone].    TO LEAVE A MESSAGE FOR THE NURSE SELECT OPTION 2, PLEASE LEAVE A MESSAGE INCLUDING: YOUR NAME DATE OF BIRTH CALL BACK NUMBER REASON FOR CALL**this is important as we prioritize the call backs  YOU WILL RECEIVE A CALL BACK THE SAME DAY AS LONG AS YOU CALL BEFORE 4:00 PM

## 2022-12-29 NOTE — Addendum Note (Signed)
Encounter addended by: Andee Lineman A on: 12/29/2022 6:16 PM  Actions taken: Imaging Exam ended

## 2022-12-31 ENCOUNTER — Ambulatory Visit (HOSPITAL_COMMUNITY)
Admission: RE | Admit: 2022-12-31 | Discharge: 2022-12-31 | Disposition: A | Discharge status: Home / Self Care | Payer: Medicare Other | Source: Ambulatory Visit | Attending: Cardiology | Admitting: Cardiology

## 2022-12-31 DIAGNOSIS — I5022 Chronic systolic (congestive) heart failure: Secondary | ICD-10-CM | POA: Insufficient documentation

## 2022-12-31 LAB — BASIC METABOLIC PANEL
Anion gap: 10 (ref 5–15)
BUN: 26 mg/dL — ABNORMAL HIGH (ref 8–23)
CO2: 23 mmol/L (ref 22–32)
Calcium: 10.4 mg/dL — ABNORMAL HIGH (ref 8.9–10.3)
Chloride: 106 mmol/L (ref 98–111)
Creatinine, Ser: 1.66 mg/dL — ABNORMAL HIGH (ref 0.61–1.24)
GFR, Estimated: 42 mL/min — ABNORMAL LOW (ref >60)
Glucose, Bld: 127 mg/dL — ABNORMAL HIGH (ref 70–99)
Potassium: 5.1 mmol/L (ref 3.5–5.1)
Sodium: 139 mmol/L (ref 135–145)

## 2023-01-05 ENCOUNTER — Ambulatory Visit: Payer: Medicare Other | Admitting: Cardiology

## 2023-02-22 ENCOUNTER — Other Ambulatory Visit: Payer: Self-pay

## 2023-02-22 ENCOUNTER — Inpatient Hospital Stay (HOSPITAL_COMMUNITY)
Admission: EM | Admit: 2023-02-22 | Discharge: 2023-02-25 | DRG: 312 | Disposition: A | Discharge status: Home / Self Care | Payer: Medicare Other | Attending: Family Medicine | Admitting: Family Medicine

## 2023-02-22 ENCOUNTER — Emergency Department (HOSPITAL_COMMUNITY): Payer: Medicare Other

## 2023-02-22 DIAGNOSIS — I2510 Atherosclerotic heart disease of native coronary artery without angina pectoris: Secondary | ICD-10-CM | POA: Diagnosis present

## 2023-02-22 DIAGNOSIS — S0990XA Unspecified injury of head, initial encounter: Secondary | ICD-10-CM

## 2023-02-22 DIAGNOSIS — Z888 Allergy status to other drugs, medicaments and biological substances status: Secondary | ICD-10-CM

## 2023-02-22 DIAGNOSIS — R310 Gross hematuria: Secondary | ICD-10-CM

## 2023-02-22 DIAGNOSIS — M109 Gout, unspecified: Secondary | ICD-10-CM | POA: Diagnosis present

## 2023-02-22 DIAGNOSIS — I471 Supraventricular tachycardia, unspecified: Secondary | ICD-10-CM | POA: Diagnosis present

## 2023-02-22 DIAGNOSIS — T45525A Adverse effect of antithrombotic drugs, initial encounter: Secondary | ICD-10-CM | POA: Diagnosis present

## 2023-02-22 DIAGNOSIS — C61 Malignant neoplasm of prostate: Secondary | ICD-10-CM | POA: Diagnosis present

## 2023-02-22 DIAGNOSIS — R55 Syncope and collapse: Secondary | ICD-10-CM | POA: Diagnosis not present

## 2023-02-22 DIAGNOSIS — E785 Hyperlipidemia, unspecified: Secondary | ICD-10-CM | POA: Diagnosis present

## 2023-02-22 DIAGNOSIS — Z87891 Personal history of nicotine dependence: Secondary | ICD-10-CM

## 2023-02-22 DIAGNOSIS — I5022 Chronic systolic (congestive) heart failure: Secondary | ICD-10-CM | POA: Diagnosis present

## 2023-02-22 DIAGNOSIS — Z8711 Personal history of peptic ulcer disease: Secondary | ICD-10-CM

## 2023-02-22 DIAGNOSIS — Z7902 Long term (current) use of antithrombotics/antiplatelets: Secondary | ICD-10-CM

## 2023-02-22 DIAGNOSIS — H9192 Unspecified hearing loss, left ear: Secondary | ICD-10-CM | POA: Diagnosis present

## 2023-02-22 DIAGNOSIS — Z923 Personal history of irradiation: Secondary | ICD-10-CM

## 2023-02-22 DIAGNOSIS — I252 Old myocardial infarction: Secondary | ICD-10-CM

## 2023-02-22 DIAGNOSIS — Z8673 Personal history of transient ischemic attack (TIA), and cerebral infarction without residual deficits: Secondary | ICD-10-CM

## 2023-02-22 DIAGNOSIS — E1122 Type 2 diabetes mellitus with diabetic chronic kidney disease: Secondary | ICD-10-CM | POA: Diagnosis present

## 2023-02-22 DIAGNOSIS — Z8249 Family history of ischemic heart disease and other diseases of the circulatory system: Secondary | ICD-10-CM

## 2023-02-22 DIAGNOSIS — Z955 Presence of coronary angioplasty implant and graft: Secondary | ICD-10-CM

## 2023-02-22 DIAGNOSIS — Y92239 Unspecified place in hospital as the place of occurrence of the external cause: Secondary | ICD-10-CM | POA: Diagnosis not present

## 2023-02-22 DIAGNOSIS — Y92009 Unspecified place in unspecified non-institutional (private) residence as the place of occurrence of the external cause: Secondary | ICD-10-CM

## 2023-02-22 DIAGNOSIS — Z79899 Other long term (current) drug therapy: Secondary | ICD-10-CM

## 2023-02-22 DIAGNOSIS — I130 Hypertensive heart and chronic kidney disease with heart failure and stage 1 through stage 4 chronic kidney disease, or unspecified chronic kidney disease: Secondary | ICD-10-CM | POA: Diagnosis present

## 2023-02-22 DIAGNOSIS — T465X5A Adverse effect of other antihypertensive drugs, initial encounter: Secondary | ICD-10-CM | POA: Diagnosis not present

## 2023-02-22 DIAGNOSIS — R001 Bradycardia, unspecified: Secondary | ICD-10-CM | POA: Diagnosis present

## 2023-02-22 DIAGNOSIS — N189 Chronic kidney disease, unspecified: Secondary | ICD-10-CM | POA: Diagnosis present

## 2023-02-22 DIAGNOSIS — R519 Headache, unspecified: Secondary | ICD-10-CM

## 2023-02-22 DIAGNOSIS — D62 Acute posthemorrhagic anemia: Secondary | ICD-10-CM | POA: Diagnosis present

## 2023-02-22 DIAGNOSIS — I951 Orthostatic hypotension: Secondary | ICD-10-CM | POA: Diagnosis not present

## 2023-02-22 DIAGNOSIS — K219 Gastro-esophageal reflux disease without esophagitis: Secondary | ICD-10-CM | POA: Diagnosis present

## 2023-02-22 DIAGNOSIS — Z9181 History of falling: Secondary | ICD-10-CM

## 2023-02-22 LAB — CBC WITH DIFFERENTIAL/PLATELET
Abs Immature Granulocytes: 0.02 10*3/uL (ref 0.00–0.07)
Basophils Absolute: 0 10*3/uL (ref 0.0–0.1)
Basophils Relative: 0 %
Eosinophils Absolute: 0 10*3/uL (ref 0.0–0.5)
Eosinophils Relative: 1 %
HCT: 26.9 % — ABNORMAL LOW (ref 39.0–52.0)
Hemoglobin: 8.4 g/dL — ABNORMAL LOW (ref 13.0–17.0)
Immature Granulocytes: 0 %
Lymphocytes Relative: 17 %
Lymphs Abs: 1.2 10*3/uL (ref 0.7–4.0)
MCH: 28.4 pg (ref 26.0–34.0)
MCHC: 31.2 g/dL (ref 30.0–36.0)
MCV: 90.9 fL (ref 80.0–100.0)
Monocytes Absolute: 0.6 10*3/uL (ref 0.1–1.0)
Monocytes Relative: 9 %
Neutro Abs: 4.9 10*3/uL (ref 1.7–7.7)
Neutrophils Relative %: 73 %
Platelets: 235 10*3/uL (ref 150–400)
RBC: 2.96 MIL/uL — ABNORMAL LOW (ref 4.22–5.81)
RDW: 14.9 % (ref 11.5–15.5)
WBC: 6.8 10*3/uL (ref 4.0–10.5)
nRBC: 0 % (ref 0.0–0.2)

## 2023-02-22 LAB — I-STAT CHEM 8, ED
BUN: 31 mg/dL — ABNORMAL HIGH (ref 8–23)
Calcium, Ion: 1.23 mmol/L (ref 1.15–1.40)
Chloride: 107 mmol/L (ref 98–111)
Creatinine, Ser: 2 mg/dL — ABNORMAL HIGH (ref 0.61–1.24)
Glucose, Bld: 143 mg/dL — ABNORMAL HIGH (ref 70–99)
HCT: 25 % — ABNORMAL LOW (ref 39.0–52.0)
Hemoglobin: 8.5 g/dL — ABNORMAL LOW (ref 13.0–17.0)
Potassium: 4.5 mmol/L (ref 3.5–5.1)
Sodium: 137 mmol/L (ref 135–145)
TCO2: 21 mmol/L — ABNORMAL LOW (ref 22–32)

## 2023-02-22 LAB — I-STAT CG4 LACTIC ACID, ED
Lactic Acid, Venous: 2.7 mmol/L (ref 0.5–1.9)
Lactic Acid, Venous: 0.8 mmol/L (ref 0.5–1.9)

## 2023-02-22 LAB — COMPREHENSIVE METABOLIC PANEL
ALT: 13 U/L (ref 0–44)
AST: 21 U/L (ref 15–41)
Albumin: 3.5 g/dL (ref 3.5–5.0)
Alkaline Phosphatase: 70 U/L (ref 38–126)
Anion gap: 11 (ref 5–15)
BUN: 31 mg/dL — ABNORMAL HIGH (ref 8–23)
CO2: 20 mmol/L — ABNORMAL LOW (ref 22–32)
Calcium: 10 mg/dL (ref 8.9–10.3)
Chloride: 105 mmol/L (ref 98–111)
Creatinine, Ser: 2.09 mg/dL — ABNORMAL HIGH (ref 0.61–1.24)
GFR, Estimated: 32 mL/min — ABNORMAL LOW (ref >60)
Glucose, Bld: 146 mg/dL — ABNORMAL HIGH (ref 70–99)
Potassium: 4.5 mmol/L (ref 3.5–5.1)
Sodium: 136 mmol/L (ref 135–145)
Total Bilirubin: 0.7 mg/dL (ref <1.2)
Total Protein: 7.3 g/dL (ref 6.5–8.1)

## 2023-02-22 LAB — BRAIN NATRIURETIC PEPTIDE: B Natriuretic Peptide: 220.5 pg/mL — ABNORMAL HIGH (ref 0.0–100.0)

## 2023-02-22 LAB — TROPONIN I (HIGH SENSITIVITY): Troponin I (High Sensitivity): 15 ng/L (ref <18)

## 2023-02-22 LAB — PROTIME-INR
INR: 0.9 (ref 0.8–1.2)
Prothrombin Time: 12.5 s (ref 11.4–15.2)

## 2023-02-22 MED ORDER — LOSARTAN POTASSIUM 50 MG PO TABS
25.0000 mg | ORAL_TABLET | Freq: Every day | ORAL | Status: DC
  Administered 2023-02-23: 25 mg via ORAL
  Filled 2023-02-22 (×2): qty 1

## 2023-02-22 MED ORDER — SODIUM CHLORIDE 0.9 % IV BOLUS: 1000.0000 mL | Freq: Once | INTRAVENOUS | Status: DC

## 2023-02-22 MED ORDER — ONDANSETRON HCL 4 MG/2ML IJ SOLN
4.0000 mg | Freq: Four times a day (QID) | INTRAMUSCULAR | Status: DC | PRN
Start: 2023-02-22 — End: 2023-02-26

## 2023-02-22 MED ORDER — ACETAMINOPHEN 325 MG PO TABS
650.0000 mg | ORAL_TABLET | Freq: Four times a day (QID) | ORAL | Status: DC | PRN
  Administered 2023-02-22 – 2023-02-25 (×5): 650 mg via ORAL
  Filled 2023-02-22 (×5): qty 2

## 2023-02-22 MED ORDER — PANTOPRAZOLE SODIUM 40 MG PO TBEC
40.0000 mg | DELAYED_RELEASE_TABLET | Freq: Two times a day (BID) | ORAL | Status: DC
  Administered 2023-02-22 – 2023-02-25 (×6): 40 mg via ORAL
  Filled 2023-02-22 (×7): qty 1

## 2023-02-22 MED ORDER — ALLOPURINOL 300 MG PO TABS
300.0000 mg | ORAL_TABLET | Freq: Every day | ORAL | Status: DC
  Administered 2023-02-23 – 2023-02-25 (×3): 300 mg via ORAL
  Filled 2023-02-22 (×3): qty 1

## 2023-02-22 MED ORDER — SODIUM CHLORIDE 0.9 % IV BOLUS
250.0000 mL | Freq: Once | INTRAVENOUS | Status: AC
  Administered 2023-02-22: 250 mL via INTRAVENOUS

## 2023-02-22 MED ORDER — ACETAMINOPHEN 650 MG RE SUPP
650.0000 mg | Freq: Four times a day (QID) | RECTAL | Status: DC | PRN
Start: 2023-02-22 — End: 2023-02-26

## 2023-02-22 MED ORDER — ENOXAPARIN SODIUM 40 MG/0.4ML IJ SOSY
40.0000 mg | PREFILLED_SYRINGE | INTRAMUSCULAR | Status: DC
  Administered 2023-02-22: 40 mg via SUBCUTANEOUS
  Filled 2023-02-22: qty 0.4

## 2023-02-22 MED ORDER — ONDANSETRON HCL 4 MG PO TABS: 4.0000 mg | ORAL_TABLET | Freq: Four times a day (QID) | ORAL | Status: DC | PRN

## 2023-02-22 NOTE — H&P (Signed)
History and Physical    Smauel Horacek WUJ:[id number] Gaddis typing we can DOB: 06-01-1945 DOA: 02/22/2023  PCP: [name], MD   Chief Complaint: syncope  HPI: Ricky Rowland. is a 77 y.o. male with medical history significant of diabetes, GERD, hypertension, prostate cancer, CAD status post stent who was in his normal state of health at church and experienced a sudden episode of syncope with no prodrome.  He fell and hit his head on the floor.  Bystanders denied any seizure-like activity.  He presented to the ER where he was back to his normal state of health.  He is hemodynamically stable.  Labs were obtained which Tau.  WBC 6.8, hemoglobin 8.4 at baseline, creatinine 2.0, troponin 15, CT spine showed no acute abnormalities.  CT head showed no acute abnormalities.  Chest x-ray demonstrated no acute findings.  Patient was admitted for further workup of syncope.  His monitor on telemetry.  He denied any chest pain or shortness of breath.  He had an echocardiogram performed in September which demonstrated mildly reduced EF of 40%.  Left heart cath at the same time showed LAD occlusion with stent placement.  Review of Systems: Review of Systems  Constitutional: Negative.  Negative for chills and fever.  HENT: Negative.    Eyes: Negative.   Respiratory: Negative.    Cardiovascular: Negative.  Negative for chest pain and palpitations.  Gastrointestinal: Negative.   Genitourinary: Negative.   Musculoskeletal: Negative.   Skin: Negative.   Neurological: Negative.   Endo/Heme/Allergies: Negative.   Psychiatric/Behavioral: Negative.    All other systems reviewed and are negative.    As per HPI otherwise 10 point review of systems negative.   Allergies  Allergen Reactions   Ezetimibe Other (See Comments)    Muscle aches   Statins Other (See Comments)    Joint pain    Past Medical History:  Diagnosis Date   Acute kidney injury superimposed on chronic kidney disease  (HCC) 11/19/2022   Borderline diabetes    DIET CONTROLLED   Bradycardia 11/17/2022   Dyslipidemia    Elevated PSA    External carotid artery stenosis    MILD-- BILATERAL   Fall 11/05/2017   GERD (gastroesophageal reflux disease)    Gout, arthritis    Hearing loss in left ear    Heart murmur    MILD --  ASYMPTOMATIC   History of gastric ulcer    REMOTE HX   Hypertension    Pre-diabetes    Prostate cancer (HCC) 12/21/2012   Gleason 3+4=7   Seizure-like activity (HCC)    Syncope and collapse    Wears glasses     Past Surgical History:  Procedure Laterality Date   CORONARY STENT INTERVENTION N/A 11/16/2022   Procedure: CORONARY STENT INTERVENTION;  Surgeon: Elder Negus, MD;  Location: [facility] INVASIVE CV LAB;  Service: Cardiovascular;  Laterality: N/A;   CORONARY ULTRASOUND/IVUS N/A 11/16/2022   Procedure: Coronary Ultrasound/IVUS;  Surgeon: Elder Negus, MD;  Location: [facility] INVASIVE CV LAB;  Service: Cardiovascular;  Laterality: N/A;   ENTEROSCOPY N/A 12/21/2022   Procedure: ENTEROSCOPY;  Surgeon: Vida Rigger, MD;  Location: [facility] ENDOSCOPY;  Service: Gastroenterology;  Laterality: N/A;   ESOPHAGOGASTRODUODENOSCOPY (EGD) WITH PROPOFOL N/A 12/02/2022   Procedure: ESOPHAGOGASTRODUODENOSCOPY (EGD) WITH PROPOFOL;  Surgeon: Charlott Rakes, MD;  Location: [facility] ENDOSCOPY;  Service: Gastroenterology;  Laterality: N/A;   ESOPHAGOGASTRODUODENOSCOPY (EGD) WITH PROPOFOL N/A 12/18/2022   Procedure: ESOPHAGOGASTRODUODENOSCOPY (EGD) WITH PROPOFOL;  Surgeon: Kerin Salen, MD;  Location: [facility] ENDOSCOPY;  Service: Gastroenterology;  Laterality: N/A;   GIVENS CAPSULE STUDY N/A 12/18/2022   Procedure: GIVENS CAPSULE STUDY;  Surgeon: Kerin Salen, MD;  Location: [facility] ENDOSCOPY;  Service: Gastroenterology;  Laterality: N/A;   HOT HEMOSTASIS N/A 12/21/2022   Procedure: HOT HEMOSTASIS (ARGON PLASMA COAGULATION/BICAP);  Surgeon: Vida Rigger, MD;  Location: [facility] ENDOSCOPY;  Service: Gastroenterology;   Laterality: N/A;  Straight Fire Probe   KNEE ARTHROSCOPY W/ MENISCAL REPAIR Left 03/10/2012   LEFT HEART CATH AND CORONARY ANGIOGRAPHY N/A 11/16/2022   Procedure: LEFT HEART CATH AND CORONARY ANGIOGRAPHY;  Surgeon: Elder Negus, MD;  Location: [facility] INVASIVE CV LAB;  Service: Cardiovascular;  Laterality: N/A;   LOOP RECORDER INSERTION N/A 01/05/2018   Procedure: LOOP RECORDER INSERTION;  Surgeon: Duke Salvia, MD;  Location[facility] INVASIVE CV LAB;  Service: Cardiovascular;  Laterality: N/A;   LUMBAR SPINE SURGERY  03/10/1989   PROSTATE BIOPSY N/A 12/21/2012   Procedure: BIOPSY TRANSRECTAL ULTRASONIC PROSTATE (TUBP);  Surgeon: Lindaann Slough, MD;  Location: [facility];  Service: Urology;  Laterality: N/A;   PROSTATE BIOPSY  11/26/2011   benign   TEMPORARY PACEMAKER N/A 11/16/2022   Procedure: TEMPORARY PACEMAKER;  Surgeon: Elder Negus, MD;  Location: [facility] INVASIVE CV LAB;  Service: Cardiovascular;  Laterality: N/A;   TONSILLECTOMY     WISDOM TOOTH EXTRACTION       reports that he quit smoking about 25 years ago. His smoking use included cigarettes. He started smoking about 55 years ago. He has a 30 pack-year smoking history. He has never used smokeless tobacco. He reports that he does not currently use alcohol after a past usage of about 20.0 standard drinks of alcohol per week. He reports that he does not use drugs.  Family History  Problem Relation Age of Onset   Heart disease Mother    Heart disease Father    Cancer Sister        lung   Cancer Brother        prostate    Prior to Admission medications   Medication Sig Start Date End Date Taking? Authorizing Provider  acetaminophen (TYLENOL) 650 MG CR tablet Take 650 mg by mouth every 8 (eight) hours as needed for pain.    [provider]  allopurinol (ZYLOPRIM) 300 MG tablet Take 300 mg by mouth daily.    [provider]  docusate sodium (COLACE) 100 MG capsule Take 2 capsules (200 mg  total) by mouth daily as needed for mild constipation. 12/05/22   Leroy Sea, MD  losartan (COZAAR) 25 MG tablet Take 1 tablet (25 mg total) by mouth daily. 12/24/22 03/24/23  Sabharwal, Eliezer Lofts, DO  Multiple Vitamins-Minerals (MULTIVITAMIN ADULT EXTRA C PO) Take 1 tablet by mouth daily. 07/24/22   [provider]  Naphazoline HCl (CLEAR EYES OP) Place 1 drop into both eyes daily as needed (dry eyes).     [provider]  nitroGLYCERIN (NITROSTAT) 0.4 MG SL tablet Place 1 tablet (0.4 mg total) under the tongue every 5 (five) minutes as needed for chest pain (up to 3 doses). 11/19/22   Dunn, Dayna N, PA-C  ORGOVYX 120 MG tablet Take 120 mg by mouth daily. 05/19/22   [provider]  pantoprazole (PROTONIX) 40 MG tablet Take 1 tablet (40 mg total) by mouth 2 (two) times daily before a meal. 12/22/22 03/22/23  Carollee Herter, DO  polyethylene glycol powder (GLYCOLAX/MIRALAX) 17 GM/SCOOP powder Take 1 capful (17 g) with water  by mouth daily as needed for mild constipation. 12/05/22   Leroy Sea, MD  REPATHA SURECLICK 140 MG/ML SOAJ Inject 140 mg into the skin every 14 (fourteen) days.  06/07/17   [provider]  tolnaftate (TINACTIN) 1 % cream Apply 1 application topically daily as needed (jock itch).    [provider]  XTANDI 40 MG capsule Take 160 mg by mouth daily.    [provider]    Physical Exam: Vitals:   02/22/23 1130 02/22/23 1445 02/22/23 1520 02/22/23 1615  BP: (!) 173/76 (!) 162/68  (!) 159/100  Pulse: (!) 55 62  69  Resp: 11 12  12   Temp:   98.2 F (36.8 C)   TempSrc:   Oral   SpO2: 100% 97%  100%  Weight:      Height:       Physical Exam Constitutional:      Appearance: He is normal weight.  HENT:     Head: Normocephalic.     Nose: Nose normal.     Mouth/Throat:     Mouth: Mucous membranes are moist.     Pharynx: Oropharynx is clear.  Eyes:     Pupils: Pupils are equal, round, and reactive to light.   Cardiovascular:     Rate and Rhythm: Normal rate and regular rhythm.     Pulses: Normal pulses.     Heart sounds: Normal heart sounds.  Pulmonary:     Effort: Pulmonary effort is normal.     Breath sounds: Normal breath sounds.  Abdominal:     General: Abdomen is flat. Bowel sounds are normal.  Musculoskeletal:        General: Normal range of motion.     Cervical back: Normal range of motion.  Skin:    General: Skin is warm.     Capillary Refill: Capillary refill takes less than 2 seconds.  Neurological:     General: No focal deficit present.     Mental Status: He is alert. Mental status is at baseline.  Psychiatric:        Mood and Affect: Mood normal.        Labs on Admission: I have personally reviewed the patients's labs and imaging studies.  Assessment/Plan Principal Problem:   Syncope   # Syncope, unclear etiology - No prodromal state making vasovagal syncope less likely on differential.  Sounds cardiogenic in etiology as no neurologic deficits noted - History of CAD  Plan: Order echocardiogram Monitor on telemetry  # CAD-continue Plavix  # Gout-continue allopurinol  # History of prostate cancer-continue xtandi, orgovyx. Patient will bring  HTN- continue losartan   Admission status: Observation Telemetry Medical  Certification: The appropriate patient status for this patient is OBSERVATION. Observation status is judged to be reasonable and necessary in order to provide the required intensity of service to ensure the patient's safety. The patient's presenting symptoms, physical exam findings, and initial radiographic and laboratory data in the context of their medical condition is felt to place them at decreased risk for further clinical deterioration. Furthermore, it is anticipated that the patient will be medically stable for discharge from the hospital within 2 midnights of admission.     Alan Mulder MD [facility] If 7PM-7AM, please  contact night-coverage [email or web address]  02/22/2023, 4:48 PM

## 2023-02-22 NOTE — ED Provider Notes (Signed)
[place] EMERGENCY DEPARTMENT AT [facility] Provider Note   CSN: [id number] Arrival date & time: 02/22/23  1115     History  Chief Complaint  Patient presents with   Justice Deeds. is a 77 y.o. male.   Fall     77 year old male with medical history significant for HTN, carotid artery stenosis, HLD, GERD, recent GI bleed on dual antiplatelet therapy, history of CAD status post recent PCI in September 2024 who presents to the emergency department as a level 2 trauma after a fall and syncopal episode this morning.  The patient states that he was at his normal state of health and was getting ready for church when he sat on a barstool and experienced syncope with no prodrome.  He fell off the barstool and struck his head on the floor.  No reported seizure-like activity.  The patient denies any headache, neck pain or other injuries or complaints.  He was leveled as a trauma due to him being on Plavix.  He denied any chest pain or shortness of breath, no lightheadedness, palpitations prior to his sudden onset of syncope.  He arrives GCS 15, ABC intact.  Home Medications Prior to Admission medications   Medication Sig Start Date End Date Taking? Authorizing Provider  acetaminophen (TYLENOL) 650 MG CR tablet Take 650 mg by mouth every 8 (eight) hours as needed for pain.    [provider]  allopurinol (ZYLOPRIM) 300 MG tablet Take 300 mg by mouth daily.    [provider]  docusate sodium (COLACE) 100 MG capsule Take 2 capsules (200 mg total) by mouth daily as needed for mild constipation. 12/05/22   Leroy Sea, MD  losartan (COZAAR) 25 MG tablet Take 1 tablet (25 mg total) by mouth daily. 12/24/22 03/24/23  Sabharwal, Eliezer Lofts, DO  Multiple Vitamins-Minerals (MULTIVITAMIN ADULT EXTRA C PO) Take 1 tablet by mouth daily. 07/24/22   [provider]  Naphazoline HCl (CLEAR EYES OP) Place 1 drop into both eyes daily as needed (dry eyes).      [provider]  nitroGLYCERIN (NITROSTAT) 0.4 MG SL tablet Place 1 tablet (0.4 mg total) under the tongue every 5 (five) minutes as needed for chest pain (up to 3 doses). 11/19/22   Dunn, Dayna N, PA-C  ORGOVYX 120 MG tablet Take 120 mg by mouth daily. 05/19/22   [provider]  pantoprazole (PROTONIX) 40 MG tablet Take 1 tablet (40 mg total) by mouth 2 (two) times daily before a meal. 12/22/22 03/22/23  Carollee Herter, DO  polyethylene glycol powder (GLYCOLAX/MIRALAX) 17 GM/SCOOP powder Take 1 capful (17 g) with water by mouth daily as needed for mild constipation. 12/05/22   Leroy Sea, MD  REPATHA SURECLICK 140 MG/ML SOAJ Inject 140 mg into the skin every 14 (fourteen) days.  06/07/17   [provider]  tolnaftate (TINACTIN) 1 % cream Apply 1 application topically daily as needed (jock itch).    [provider]  XTANDI 40 MG capsule Take 160 mg by mouth daily.    [provider]      Allergies    Ezetimibe and Statins    Review of Systems   Review of Systems  All other systems reviewed and are negative.   Physical Exam Updated Vital Signs BP (!) 162/68 (BP Location: Right Arm)   Pulse 62   Temp 98.5 F (36.9 C) (Oral)   Resp 12   Ht 6\' 3"  (1.905  m)   Wt 95 kg   SpO2 97%   BMI 26.18 kg/m  Physical Exam Vitals and nursing note reviewed.  Constitutional:      Appearance: He is well-developed.     Comments: GCS 15, ABC intact  HENT:     Head: Normocephalic.  Eyes:     Conjunctiva/sclera: Conjunctivae normal.  Neck:     Comments: No midline tenderness to palpation of the cervical spine. ROM intact. Cardiovascular:     Rate and Rhythm: Normal rate and regular rhythm.     Heart sounds: No murmur heard. Pulmonary:     Effort: Pulmonary effort is normal. No respiratory distress.     Breath sounds: Normal breath sounds.  Chest:     Comments: Chest wall stable and non-tender to AP and lateral compression. Clavicles stable and  non-tender to AP compression Abdominal:     Palpations: Abdomen is soft.     Tenderness: There is no abdominal tenderness.     Comments: Pelvis stable to lateral compression.  Musculoskeletal:     Cervical back: Neck supple.     Comments: No midline tenderness to palpation of the thoracic or lumbar spine. Extremities atraumatic with intact ROM.   Skin:    General: Skin is warm and dry.  Neurological:     Mental Status: He is alert.     Comments: CN II-XII grossly intact. Moving all four extremities spontaneously and sensation grossly intact.  Psychiatric:        Attention and Perception: Attention and perception normal.        Mood and Affect: Affect is tearful.        Speech: Speech normal.        Behavior: Behavior normal. Behavior is cooperative.     ED Results / Procedures / Treatments   Labs (all labs ordered are listed, but only abnormal results are displayed) Labs Reviewed  CBC WITH DIFFERENTIAL/PLATELET - Abnormal; Notable for the following components:      Result Value   RBC 2.96 (*)    Hemoglobin 8.4 (*)    HCT 26.9 (*)    All other components within normal limits  COMPREHENSIVE METABOLIC PANEL - Abnormal; Notable for the following components:   CO2 20 (*)    Glucose, Bld 146 (*)    BUN 31 (*)    Creatinine, Ser 2.09 (*)    GFR, Estimated 32 (*)    All other components within normal limits  BRAIN NATRIURETIC PEPTIDE - Abnormal; Notable for the following components:   B Natriuretic Peptide 220.5 (*)    All other components within normal limits  I-STAT CHEM 8, ED - Abnormal; Notable for the following components:   BUN 31 (*)    Creatinine, Ser 2.00 (*)    Glucose, Bld 143 (*)    TCO2 21 (*)    Hemoglobin 8.5 (*)    HCT 25.0 (*)    All other components within normal limits  I-STAT CG4 LACTIC ACID, ED - Abnormal; Notable for the following components:   Lactic Acid, Venous 2.7 (*)    All other components within normal limits  PROTIME-INR  I-STAT CG4 LACTIC  ACID, ED  TROPONIN I (HIGH SENSITIVITY)    EKG EKG Interpretation Date/Time:  Sunday February 22 2023 13:04:22 EST Ventricular Rate:  63 PR Interval:  162 QRS Duration:  97 QT Interval:  437 QTC Calculation: 448 R Axis:   -40  Text Interpretation: Sinus rhythm Atrial premature complex Probable left atrial enlargement  Abnormal R-wave progression, early transition Left ventricular hypertrophy Confirmed by Ernie Avena ([id number]) on 02/22/2023 1:11:28 PM  Radiology CT HEAD WO CONTRAST ( ) Result Date: 02/22/2023 CLINICAL DATA:  Head trauma, minor (Age >= 65y); Neck trauma (Age >= 65y) EXAM: CT HEAD WITHOUT CONTRAST CT CERVICAL SPINE WITHOUT CONTRAST TECHNIQUE: Multidetector CT imaging of the head and cervical spine was performed following the standard protocol without intravenous contrast. Multiplanar CT image reconstructions of the cervical spine were also generated. RADIATION DOSE REDUCTION: This exam was performed according to the departmental dose-optimization program which includes automated exposure control, adjustment of the mA and/or kV according to patient size and/or use of iterative reconstruction technique. COMPARISON:  CT head and CT cervical spine 12/01/2022. FINDINGS: CT HEAD FINDINGS Brain: No evidence of acute infarction, hemorrhage, hydrocephalus, extra-axial collection or mass lesion/mass effect. 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 Vascular: Calcific atherosclerosis. No hyperdense vessel identified. Skull: No acute fracture. Sinuses/Orbits: Clear sinuses.  No acute orbital findings. Other: No mastoid effusions. CT CERVICAL SPINE FINDINGS Alignment: Straightening.  No substantial sagittal subluxation. Skull base and vertebrae: No acute fracture. Vertebral body heights are maintained. Soft tissues and spinal canal: No prevertebral fluid or swelling. No visible canal hematoma. Disc levels:  Mild for age multilevel bony degenerative change. Upper chest: Visualized lung  apices are clear. Other: Atherosclerosis. IMPRESSION: No evidence of acute abnormality intracranially or in the cervical spine. Electronically Signed   By: Feliberto Harts M.D.   On: 02/22/2023 12:10   CT Cervical Spine Wo Contrast Result Date: 02/22/2023 CLINICAL DATA:  Head trauma, minor (Age >= 65y); Neck trauma (Age >= 65y) EXAM: CT HEAD WITHOUT CONTRAST CT CERVICAL SPINE WITHOUT CONTRAST TECHNIQUE: Multidetector CT imaging of the head and cervical spine was performed following the standard protocol without intravenous contrast. Multiplanar CT image reconstructions of the cervical spine were also generated. RADIATION DOSE REDUCTION: This exam was performed according to the departmental dose-optimization program which includes automated exposure control, adjustment of the mA and/or kV according to patient size and/or use of iterative reconstruction technique. COMPARISON:  CT head and CT cervical spine 12/01/2022. FINDINGS: CT HEAD FINDINGS Brain: No evidence of acute infarction, hemorrhage, hydrocephalus, extra-axial collection or mass lesion/mass effect. 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 Vascular: Calcific atherosclerosis. No hyperdense vessel identified. Skull: No acute fracture. Sinuses/Orbits: Clear sinuses.  No acute orbital findings. Other: No mastoid effusions. CT CERVICAL SPINE FINDINGS Alignment: Straightening.  No substantial sagittal subluxation. Skull base and vertebrae: No acute fracture. Vertebral body heights are maintained. Soft tissues and spinal canal: No prevertebral fluid or swelling. No visible canal hematoma. Disc levels:  Mild for age multilevel bony degenerative change. Upper chest: Visualized lung apices are clear. Other: Atherosclerosis. IMPRESSION: No evidence of acute abnormality intracranially or in the cervical spine. Electronically Signed   By: Feliberto Harts M.D.   On: 02/22/2023 12:10   DG Chest Portable 1 View Result Date: 02/22/2023 CLINICAL  DATA:  Larey Seat EXAM: PORTABLE CHEST - 1 VIEW COMPARISON:  12/01/2022 FINDINGS: Lungs are clear.  No pneumothorax. Heart size and mediastinal contours are within normal limits. No effusion. Visualized bones unremarkable. IMPRESSION: No acute cardiopulmonary disease. Electronically Signed   By: Corlis Leak M.D.   On: 02/22/2023 11:58    Procedures Procedures    Medications Ordered in ED Medications  sodium chloride 0.9 % bolus 250 mL (250 mLs Intravenous New Bag/Given 02/22/23 1313)    ED Course/ Medical Decision Making/ A&P  Medical Decision Making Amount and/or Complexity of Data Reviewed Labs: ordered. Radiology: ordered.  Risk Decision regarding hospitalization.     77 year old male with medical history significant for HTN, carotid artery stenosis, HLD, GERD, recent GI bleed on dual antiplatelet therapy, history of CAD status post recent PCI in September 2024 who presents to the emergency department as a level 2 trauma after a fall and syncopal episode this morning.  The patient states that he was at his normal state of health and was getting ready for church when he sat on a barstool and experienced syncope with no prodrome.  He fell off the barstool and struck his head on the floor.  No reported seizure-like activity.  The patient denies any headache, neck pain or other injuries or complaints.  He was leveled as a trauma due to him being on Plavix.  He denied any chest pain or shortness of breath, no lightheadedness, palpitations prior to his sudden onset of syncope.  He arrives GCS 15, ABC intact.  On arrival, the patient is vitally stable, initially tachycardic but improved to heart rate in the 60s, hypertensive 168/94, saturating 100% on room air on my evaluation.  Afebrile   Trauma Workup:  CT Head and C-spine: IMPRESSION:  No evidence of acute abnormality intracranially or in the cervical  spine.   CXR: IMPRESSION:  No acute cardiopulmonary  disease.    Medical Decision Making:   Shango Pippenger. is a 77 y.o. male who presented to the ED today with a syncopal episode detailed above.    Patient placed on continuous vitals and telemetry monitoring while in ED which was reviewed periodically.  Complete initial physical exam performed, notably the patient  was CTAB, neuro intact, no evidence of trauma.    Reviewed and confirmed nursing documentation for past medical history, family history, social history.    Initial Assessment:   With the patient's presentation of syncope, most likely diagnosis is orthostatic hypotension, dehydration vs cardiac arrhythmia. Other diagnoses were considered including (but not limited to) arrythmogenic syncope, valvular abnormality, PE, aortic dissection. These are considered less likely due to history of present illness and physical exam findings.   This is most consistent with an acute life/limb threatening illness complicated by underlying chronic conditions.    Initial Plan for Syncope Eval:  Screening labs including CBC and Metabolic panel to evaluate for infectious or metabolic etiology of disease.  CXR to evaluate for structural/infectious intrathoracic pathology.  EKG to evaluate for cardiac pathology.  Objective evaluation as below reviewed after administration of IVF/Telemetry monitoring  Initial Study Results:   Laboratory  All laboratory results reviewed without evidence of clinically relevant pathology.   Exceptions include: Lactic acid up to 2.7, BNP moderately elevated to 221, Cr up from baseline to 2.09, BUN up to 31. Hgb close to baseline at 8.4.   EKG EKG was reviewed independently. Rate, rhythm, axis, intervals all examined and without medically relevant abnormality. ST segments without concerns for elevations.    Radiology:  All images reviewed independently. Agree with radiology report at this time.   CT HEAD WO CONTRAST ( ) Result Date: 02/22/2023 CLINICAL DATA:   Head trauma, minor (Age >= 65y); Neck trauma (Age >= 65y) EXAM: CT HEAD WITHOUT CONTRAST CT CERVICAL SPINE WITHOUT CONTRAST TECHNIQUE: Multidetector CT imaging of the head and cervical spine was performed following the standard protocol without intravenous contrast. Multiplanar CT image reconstructions of the cervical spine were also generated. RADIATION DOSE REDUCTION: This exam was performed according  to the departmental dose-optimization program which includes automated exposure control, adjustment of the mA and/or kV according to patient size and/or use of iterative reconstruction technique. COMPARISON:  CT head and CT cervical spine 12/01/2022. FINDINGS: CT HEAD FINDINGS Brain: No evidence of acute infarction, hemorrhage, hydrocephalus, extra-axial collection or mass lesion/mass effect. 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 Vascular: Calcific atherosclerosis. No hyperdense vessel identified. Skull: No acute fracture. Sinuses/Orbits: Clear sinuses.  No acute orbital findings. Other: No mastoid effusions. CT CERVICAL SPINE FINDINGS Alignment: Straightening.  No substantial sagittal subluxation. Skull base and vertebrae: No acute fracture. Vertebral body heights are maintained. Soft tissues and spinal canal: No prevertebral fluid or swelling. No visible canal hematoma. Disc levels:  Mild for age multilevel bony degenerative change. Upper chest: Visualized lung apices are clear. Other: Atherosclerosis. IMPRESSION: No evidence of acute abnormality intracranially or in the cervical spine. Electronically Signed   By: Feliberto Harts M.D.   On: 02/22/2023 12:10   CT Cervical Spine Wo Contrast Result Date: 02/22/2023 CLINICAL DATA:  Head trauma, minor (Age >= 65y); Neck trauma (Age >= 65y) EXAM: CT HEAD WITHOUT CONTRAST CT CERVICAL SPINE WITHOUT CONTRAST TECHNIQUE: Multidetector CT imaging of the head and cervical spine was performed following the standard protocol without intravenous contrast.  Multiplanar CT image reconstructions of the cervical spine were also generated. RADIATION DOSE REDUCTION: This exam was performed according to the departmental dose-optimization program which includes automated exposure control, adjustment of the mA and/or kV according to patient size and/or use of iterative reconstruction technique. COMPARISON:  CT head and CT cervical spine 12/01/2022. FINDINGS: CT HEAD FINDINGS Brain: No evidence of acute infarction, hemorrhage, hydrocephalus, extra-axial collection or mass lesion/mass effect. 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 Vascular: Calcific atherosclerosis. No hyperdense vessel identified. Skull: No acute fracture. Sinuses/Orbits: Clear sinuses.  No acute orbital findings. Other: No mastoid effusions. CT CERVICAL SPINE FINDINGS Alignment: Straightening.  No substantial sagittal subluxation. Skull base and vertebrae: No acute fracture. Vertebral body heights are maintained. Soft tissues and spinal canal: No prevertebral fluid or swelling. No visible canal hematoma. Disc levels:  Mild for age multilevel bony degenerative change. Upper chest: Visualized lung apices are clear. Other: Atherosclerosis. IMPRESSION: No evidence of acute abnormality intracranially or in the cervical spine. Electronically Signed   By: Feliberto Harts M.D.   On: 02/22/2023 12:10   DG Chest Portable 1 View Result Date: 02/22/2023 CLINICAL DATA:  Larey Seat EXAM: PORTABLE CHEST - 1 VIEW COMPARISON:  12/01/2022 FINDINGS: Lungs are clear.  No pneumothorax. Heart size and mediastinal contours are within normal limits. No effusion. Visualized bones unremarkable. IMPRESSION: No acute cardiopulmonary disease. Electronically Signed   By: Corlis Leak M.D.   On: 02/22/2023 11:58        Final Assessment and Plan:   The patient has evidence of mild volume depletion and worsening renal function. Pt given 250cc IVF for mild volume repletion.  Patient cleared from a traumatic standpoint.   Given his syncope with no prodrome, I recommended admission for observation. Hospitalist medicine consulted for admission, Dr. Avie Arenas accepting.   Clinical Impression:  1. Syncope, unspecified syncope type      Admit     Final Clinical Impression(s) / ED Diagnoses Final diagnoses:  Syncope, unspecified syncope type    Rx / DC Orders ED Discharge Orders     None         Ernie Avena, MD 02/22/23 1512

## 2023-02-22 NOTE — Progress Notes (Signed)
   02/22/23 1148  Spiritual Encounters  Type of Visit Attempt (pt unavailable)  Care provided to: Pt not available  Conversation partners present during encounter Nurse  Referral source Trauma page  Reason for visit Trauma  OnCall Visit Yes   Chaplain spoke to patient briefly before more tests were to be done. Patient stated family would be on there way. Chaplain checked in the ED waiting and with security. Family had not yet arrived. No further needs at this time.   Arlyce Dice, Chaplain Resident [phone]

## 2023-02-22 NOTE — ED Notes (Signed)
ED TO INPATIENT HANDOFF REPORT  ED Nurse Name and Phone #: [phone]  S Name/Age/Gender Ricky Panther Sr. 77 y.o. male Room/Bed: 008C/008C  Code Status   Code Status: Full Code  Home/SNF/Other Home Patient oriented to: self, place, time, and situation Is this baseline? Yes   Triage Complete: Triage complete  Chief Complaint Syncope [R55]  Triage Note Pt to the ed from home via ems  with a CC fall off bar stool. Pt is on blood thinners. Pt walked down stairs and sat on the stool to take his morning meds and fell from the stool. Pt can  not remember what happen but woke up on the ground. Pt denies and pain, current dizziness, chest pain or any other s/s at this time.    Allergies Allergies  Allergen Reactions   Ezetimibe Other (See Comments)    Muscle aches   Statins Other (See Comments)    Joint pain    Level of Care/Admitting Diagnosis ED Disposition     ED Disposition  Admit   Condition  --   Comment  Hospital Area: [facility] [[id number]]  Level of Care: Telemetry Medical [104]  May place patient in observation at [facility] or [facility] if equivalent level of care is available:: No  Covid Evaluation: Asymptomatic - no recent exposure (last 10 days) testing not required  Diagnosis: Syncope [206001]  Admitting Physician: Alan Mulder [[id number]]  Attending Physician: Alan Mulder [[id number]]          B Medical/Surgery History Past Medical History:  Diagnosis Date   Acute kidney injury superimposed on chronic kidney disease (HCC) 11/19/2022   Borderline diabetes    DIET CONTROLLED   Bradycardia 11/17/2022   Dyslipidemia    Elevated PSA    External carotid artery stenosis    MILD-- BILATERAL   Fall 11/05/2017   GERD (gastroesophageal reflux disease)    Gout, arthritis    Hearing loss in left ear    Heart murmur    MILD --  ASYMPTOMATIC   History of gastric ulcer    REMOTE HX   Hypertension    Pre-diabetes    Prostate cancer  (HCC) 12/21/2012   Gleason 3+4=7   Seizure-like activity (HCC)    Syncope and collapse    Wears glasses    Past Surgical History:  Procedure Laterality Date   CORONARY STENT INTERVENTION N/A 11/16/2022   Procedure: CORONARY STENT INTERVENTION;  Surgeon: Elder Negus, MD;  Location: [facility];  Service: Cardiovascular;  Laterality: N/A;   CORONARY ULTRASOUND/IVUS N/A 11/16/2022   Procedure: Coronary Ultrasound/IVUS;  Surgeon: Elder Negus, MD;  Location: [facility];  Service: Cardiovascular;  Laterality: N/A;   ENTEROSCOPY N/A 12/21/2022   Procedure: ENTEROSCOPY;  Surgeon: Vida Rigger, MD;  Location: [facility] ENDOSCOPY;  Service: Gastroenterology;  Laterality: N/A;   ESOPHAGOGASTRODUODENOSCOPY (EGD) WITH PROPOFOL N/A 12/02/2022   Procedure: ESOPHAGOGASTRODUODENOSCOPY (EGD) WITH PROPOFOL;  Surgeon: Charlott Rakes, MD;  Location: [facility] ENDOSCOPY;  Service: Gastroenterology;  Laterality: N/A;   ESOPHAGOGASTRODUODENOSCOPY (EGD) WITH PROPOFOL N/A 12/18/2022   Procedure: ESOPHAGOGASTRODUODENOSCOPY (EGD) WITH PROPOFOL;  Surgeon: Kerin Salen, MD;  Location: [facility] ENDOSCOPY;  Service: Gastroenterology;  Laterality: N/A;   GIVENS CAPSULE STUDY N/A 12/18/2022   Procedure: GIVENS CAPSULE STUDY;  Surgeon: Kerin Salen, MD;  Location: [facility] ENDOSCOPY;  Service: Gastroenterology;  Laterality: N/A;   HOT HEMOSTASIS N/A 12/21/2022   Procedure: HOT HEMOSTASIS (ARGON PLASMA COAGULATION/BICAP);  Surgeon: Vida Rigger, MD;  Location: [facility] ENDOSCOPY;  Service: Gastroenterology;  Laterality: N/A;  Straight Fire Probe   KNEE ARTHROSCOPY W/ MENISCAL REPAIR Left 03/10/2012   LEFT HEART CATH AND CORONARY ANGIOGRAPHY N/A 11/16/2022   Procedure: LEFT HEART CATH AND CORONARY ANGIOGRAPHY;  Surgeon: Elder Negus, MD;  Location: [facility];  Service: Cardiovascular;  Laterality: N/A;   LOOP RECORDER INSERTION N/A 01/05/2018   Procedure: LOOP RECORDER INSERTION;  Surgeon: Duke Salvia, MD;   Location: [facility] INVASIVE CV LAB;  Service: Cardiovascular;  Laterality: N/A;   LUMBAR SPINE SURGERY  03/10/1989   PROSTATE BIOPSY N/A 12/21/2012   Procedure: BIOPSY TRANSRECTAL ULTRASONIC PROSTATE (TUBP);  Surgeon: Lindaann Slough, MD;  Location: [facility];  Service: Urology;  Laterality: N/A;   PROSTATE BIOPSY  11/26/2011   benign   TEMPORARY PACEMAKER N/A 11/16/2022   Procedure: TEMPORARY PACEMAKER;  Surgeon: Elder Negus, MD;  Location: [facility];  Service: Cardiovascular;  Laterality: N/A;   TONSILLECTOMY     WISDOM TOOTH EXTRACTION       A IV Location/Drains/Wounds Patient Lines/Drains/Airways Status     Active Line/Drains/Airways     Name Placement date Placement time Site Days   Peripheral IV 02/22/23 20 G Right Antecubital 02/22/23  1304  Antecubital  less than 1   Wound / Incision (Open or Dehisced) 12/02/22 Laceration Head Left;Upper 12/02/22  1423  Head  82            Intake/Output Last 24 hours No intake or output data in the 24 hours ending 02/22/23 1630  Labs/Imaging Results for orders placed or performed during the hospital encounter of 02/22/23 (from the past 48 hours)  CBC with Differential     Status: Abnormal   Collection Time: 02/22/23 11:16 AM  Result Value Ref Range   WBC 6.8 4.0 - 10.5 K/uL   RBC 2.96 (L) 4.22 - 5.81 MIL/uL   Hemoglobin 8.4 (L) 13.0 - 17.0 g/dL   HCT 13.0 (L) 86.5 - 78.4 %   MCV 90.9 80.0 - 100.0 fL   MCH 28.4 26.0 - 34.0 pg   MCHC 31.2 30.0 - 36.0 g/dL   RDW 69.6 29.5 - 28.4 %   Platelets 235 150 - 400 K/uL   nRBC 0.0 0.0 - 0.2 %   Neutrophils Relative % 73 %   Neutro Abs 4.9 1.7 - 7.7 K/uL   Lymphocytes Relative 17 %   Lymphs Abs 1.2 0.7 - 4.0 K/uL   Monocytes Relative 9 %   Monocytes Absolute 0.6 0.1 - 1.0 K/uL   Eosinophils Relative 1 %   Eosinophils Absolute 0.0 0.0 - 0.5 K/uL   Basophils Relative 0 %   Basophils Absolute 0.0 0.0 - 0.1 K/uL   Immature Granulocytes 0 %   Abs Immature  Granulocytes 0.02 0.00 - 0.07 K/uL    Comment: Performed at [facility], [address], [place], [place] [address]  Comprehensive metabolic panel     Status: Abnormal   Collection Time: 02/22/23 11:16 AM  Result Value Ref Range   Sodium 136 135 - 145 mmol/L   Potassium 4.5 3.5 - 5.1 mmol/L   Chloride 105 98 - 111 mmol/L   CO2 20 (L) 22 - 32 mmol/L   Glucose, Bld 146 (H) 70 - 99 mg/dL    Comment: Glucose reference range applies only to samples taken after fasting for at least 8 hours.   BUN 31 (H) 8 - 23 mg/dL   Creatinine, Ser 0.10 (H) 0.61 -  1.24 mg/dL   Calcium 32.9 8.9 - 51.8 mg/dL   Total Protein 7.3 6.5 - 8.1 g/dL   Albumin 3.5 3.5 - 5.0 g/dL   AST 21 15 - 41 U/L   ALT 13 0 - 44 U/L   Alkaline Phosphatase 70 38 - 126 U/L   Total Bilirubin 0.7 <1.2 mg/dL   GFR, Estimated 32 (L) >60 mL/min    Comment: (NOTE) Calculated using the CKD-EPI Creatinine Equation (2021)    Anion gap 11 5 - 15    Comment: Performed at [facility], [address] [place], [place] [address]  Troponin I (High Sensitivity)     Status: None   Collection Time: 02/22/23 11:16 AM  Result Value Ref Range   Troponin I (High Sensitivity) 15 <18 ng/L    Comment: (NOTE) Elevated high sensitivity troponin I (hsTnI) values and significant  changes across serial measurements may suggest ACS but many other  chronic and acute conditions are known to elevate hsTnI results.  Refer to the "Links" section for chest pain algorithms and additional  guidance. Performed at [facility], [address] [place], [place] [address]   Brain natriuretic peptide     Status: Abnormal   Collection Time: 02/22/23 11:16 AM  Result Value Ref Range   B Natriuretic Peptide 220.5 (H) 0.0 - 100.0 pg/mL    Comment: Performed at [facility], [address] [place], [place] [address]  Protime-INR     Status: None   Collection Time: 02/22/23 11:16 AM  Result Value Ref Range   Prothrombin Time 12.5 11.4 -  15.2 seconds   INR 0.9 0.8 - 1.2    Comment: (NOTE) INR goal varies based on device and disease states. Performed at [facility], [address], [place], [place] [address]   I-stat chem 8, ed     Status: Abnormal   Collection Time: 02/22/23 11:29 AM  Result Value Ref Range   Sodium 137 135 - 145 mmol/L   Potassium 4.5 3.5 - 5.1 mmol/L   Chloride 107 98 - 111 mmol/L   BUN 31 (H) 8 - 23 mg/dL   Creatinine, Ser 7.32 (H) 0.61 - 1.24 mg/dL   Glucose, Bld 202 (H) 70 - 99 mg/dL    Comment: Glucose reference range applies only to samples taken after fasting for at least 8 hours.   Calcium, Ion 1.23 1.15 - 1.40 mmol/L   TCO2 21 (L) 22 - 32 mmol/L   Hemoglobin 8.5 (L) 13.0 - 17.0 g/dL   HCT 54.2 (L) 70.6 - 23.7 %  I-Stat CG4 Lactic Acid, ED     Status: Abnormal   Collection Time: 02/22/23 11:30 AM  Result Value Ref Range   Lactic Acid, Venous 2.7 (HH) 0.5 - 1.9 mmol/L   Comment NOTIFIED PHYSICIAN    CT HEAD WO CONTRAST ( ) Result Date: 02/22/2023 CLINICAL DATA:  Head trauma, minor (Age >= 65y); Neck trauma (Age >= 65y) EXAM: CT HEAD WITHOUT CONTRAST CT CERVICAL SPINE WITHOUT CONTRAST TECHNIQUE: Multidetector CT imaging of the head and cervical spine was performed following the standard protocol without intravenous contrast. Multiplanar CT image reconstructions of the cervical spine were also generated. RADIATION DOSE REDUCTION: This exam was performed according to the departmental dose-optimization program which includes automated exposure control, adjustment of the mA and/or kV according to patient size and/or use of iterative reconstruction technique. COMPARISON:  CT head and CT cervical spine 12/01/2022. FINDINGS: CT HEAD FINDINGS Brain:  No evidence of acute infarction, hemorrhage, hydrocephalus, extra-axial collection or mass lesion/mass effect. 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 Vascular: Calcific atherosclerosis. No hyperdense vessel identified. Skull: No acute  fracture. Sinuses/Orbits: Clear sinuses.  No acute orbital findings. Other: No mastoid effusions. CT CERVICAL SPINE FINDINGS Alignment: Straightening.  No substantial sagittal subluxation. Skull base and vertebrae: No acute fracture. Vertebral body heights are maintained. Soft tissues and spinal canal: No prevertebral fluid or swelling. No visible canal hematoma. Disc levels:  Mild for age multilevel bony degenerative change. Upper chest: Visualized lung apices are clear. Other: Atherosclerosis. IMPRESSION: No evidence of acute abnormality intracranially or in the cervical spine. Electronically Signed   By: Feliberto Harts M.D.   On: 02/22/2023 12:10   CT Cervical Spine Wo Contrast Result Date: 02/22/2023 CLINICAL DATA:  Head trauma, minor (Age >= 65y); Neck trauma (Age >= 65y) EXAM: CT HEAD WITHOUT CONTRAST CT CERVICAL SPINE WITHOUT CONTRAST TECHNIQUE: Multidetector CT imaging of the head and cervical spine was performed following the standard protocol without intravenous contrast. Multiplanar CT image reconstructions of the cervical spine were also generated. RADIATION DOSE REDUCTION: This exam was performed according to the departmental dose-optimization program which includes automated exposure control, adjustment of the mA and/or kV according to patient size and/or use of iterative reconstruction technique. COMPARISON:  CT head and CT cervical spine 12/01/2022. FINDINGS: CT HEAD FINDINGS Brain: No evidence of acute infarction, hemorrhage, hydrocephalus, extra-axial collection or mass lesion/mass effect. 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 Vascular: Calcific atherosclerosis. No hyperdense vessel identified. Skull: No acute fracture. Sinuses/Orbits: Clear sinuses.  No acute orbital findings. Other: No mastoid effusions. CT CERVICAL SPINE FINDINGS Alignment: Straightening.  No substantial sagittal subluxation. Skull base and vertebrae: No acute fracture. Vertebral body heights are  maintained. Soft tissues and spinal canal: No prevertebral fluid or swelling. No visible canal hematoma. Disc levels:  Mild for age multilevel bony degenerative change. Upper chest: Visualized lung apices are clear. Other: Atherosclerosis. IMPRESSION: No evidence of acute abnormality intracranially or in the cervical spine. Electronically Signed   By: Feliberto Harts M.D.   On: 02/22/2023 12:10   DG Chest Portable 1 View Result Date: 02/22/2023 CLINICAL DATA:  Larey Seat EXAM: PORTABLE CHEST - 1 VIEW COMPARISON:  12/01/2022 FINDINGS: Lungs are clear.  No pneumothorax. Heart size and mediastinal contours are within normal limits. No effusion. Visualized bones unremarkable. IMPRESSION: No acute cardiopulmonary disease. Electronically Signed   By: Corlis Leak M.D.   On: 02/22/2023 11:58    Pending Labs Unresulted Labs (From admission, onward)    None       Vitals/Pain Today's Vitals   02/22/23 1130 02/22/23 1445 02/22/23 1520 02/22/23 1615  BP: (!) 173/76 (!) 162/68  (!) 159/100  Pulse: (!) 55 62  69  Resp: 11 12  12   Temp:   98.2 F (36.8 C)   TempSrc:   Oral   SpO2: 100% 97%  100%  Weight:      Height:      PainSc:        Isolation Precautions No active isolations  Medications Medications  allopurinol (ZYLOPRIM) tablet 300 mg (has no administration in time range)  losartan (COZAAR) tablet 25 mg (has no administration in time range)  pantoprazole (PROTONIX) EC tablet 40 mg (has no administration in time range)  enoxaparin (LOVENOX) injection 40 mg (has no administration in time range)  acetaminophen (TYLENOL) tablet 650 mg (has no administration in time range)    Or  acetaminophen (TYLENOL) suppository 650 mg (has no administration in time range)  ondansetron (ZOFRAN) tablet 4 mg (has no administration in time range)    Or  ondansetron (ZOFRAN) injection 4 mg (has no administration in time range)  sodium chloride 0.9 % bolus 250 mL (0 mLs Intravenous Stopped 02/22/23 1518)     Mobility walks     Focused Assessments Cardiac Assessment Handoff:    No results found for: "CKTOTAL", "CKMB", "CKMBINDEX", "TROPONINI" Lab Results  Component Value Date   DDIMER 1.82 (H) 12/01/2022   Does the Patient currently have chest pain? No    R Recommendations: See Admitting Provider Note  Report given to:   Additional Notes:

## 2023-02-22 NOTE — ED Triage Notes (Signed)
Pt to the ed from home via ems  with a CC fall off bar stool. Pt is on blood thinners. Pt walked down stairs and sat on the stool to take his morning meds and fell from the stool. Pt can  not remember what happen but woke up on the ground. Pt denies and pain, current dizziness, chest pain or any other s/s at this time.

## 2023-02-23 ENCOUNTER — Encounter (HOSPITAL_COMMUNITY): Payer: Self-pay | Admitting: Internal Medicine

## 2023-02-23 ENCOUNTER — Observation Stay (HOSPITAL_BASED_OUTPATIENT_CLINIC_OR_DEPARTMENT_OTHER): Payer: Medicare Other

## 2023-02-23 DIAGNOSIS — R310 Gross hematuria: Secondary | ICD-10-CM

## 2023-02-23 DIAGNOSIS — R55 Syncope and collapse: Secondary | ICD-10-CM

## 2023-02-23 LAB — URINALYSIS, W/ REFLEX TO CULTURE (INFECTION SUSPECTED)
Bilirubin Urine: NEGATIVE
Glucose, UA: NEGATIVE mg/dL
Ketones, ur: NEGATIVE mg/dL
Leukocytes,Ua: NEGATIVE
Nitrite: NEGATIVE
Protein, ur: >=300 mg/dL — AB
Specific Gravity, Urine: 1.02 (ref 1.005–1.030)
pH: 5 (ref 5.0–8.0)

## 2023-02-23 LAB — ECHOCARDIOGRAM COMPLETE
AR max vel: 3.03 cm2
AV Peak grad: 6.2 mm[Hg]
Ao pk vel: 1.24 m/s
Area-P 1/2: 2.76 cm2
Height: 75 in
S' Lateral: 3.7 cm
Weight: 3350.99 [oz_av]

## 2023-02-23 LAB — CBC
HCT: 26.3 % — ABNORMAL LOW (ref 39.0–52.0)
Hemoglobin: 8.4 g/dL — ABNORMAL LOW (ref 13.0–17.0)
MCH: 28.3 pg (ref 26.0–34.0)
MCHC: 31.9 g/dL (ref 30.0–36.0)
MCV: 88.6 fL (ref 80.0–100.0)
Platelets: 233 10*3/uL (ref 150–400)
RBC: 2.97 MIL/uL — ABNORMAL LOW (ref 4.22–5.81)
RDW: 14.7 % (ref 11.5–15.5)
WBC: 7.6 10*3/uL (ref 4.0–10.5)
nRBC: 0 % (ref 0.0–0.2)

## 2023-02-23 LAB — BASIC METABOLIC PANEL
Anion gap: 11 (ref 5–15)
BUN: 27 mg/dL — ABNORMAL HIGH (ref 8–23)
CO2: 22 mmol/L (ref 22–32)
Calcium: 9.7 mg/dL (ref 8.9–10.3)
Chloride: 105 mmol/L (ref 98–111)
Creatinine, Ser: 1.7 mg/dL — ABNORMAL HIGH (ref 0.61–1.24)
GFR, Estimated: 41 mL/min — ABNORMAL LOW (ref >60)
Glucose, Bld: 142 mg/dL — ABNORMAL HIGH (ref 70–99)
Potassium: 3.9 mmol/L (ref 3.5–5.1)
Sodium: 138 mmol/L (ref 135–145)

## 2023-02-23 MED ORDER — ENZALUTAMIDE 40 MG PO CAPS
160.0000 mg | ORAL_CAPSULE | Freq: Every day | ORAL | Status: DC
  Administered 2023-02-23 – 2023-02-25 (×3): 160 mg via ORAL
  Filled 2023-02-23 (×3): qty 4

## 2023-02-23 MED ORDER — RELUGOLIX 120 MG PO TABS
120.0000 mg | ORAL_TABLET | Freq: Every day | ORAL | Status: DC
  Administered 2023-02-23 – 2023-02-25 (×3): 120 mg via ORAL
  Filled 2023-02-23 (×3): qty 1

## 2023-02-23 MED ORDER — CLOPIDOGREL BISULFATE 75 MG PO TABS
75.0000 mg | ORAL_TABLET | Freq: Every day | ORAL | Status: DC
Start: 2023-02-23 — End: 2023-02-26
  Administered 2023-02-23 – 2023-02-25 (×3): 75 mg via ORAL
  Filled 2023-02-23 (×3): qty 1

## 2023-02-23 MED ORDER — LOSARTAN POTASSIUM 50 MG PO TABS
25.0000 mg | ORAL_TABLET | Freq: Once | ORAL | Status: AC
  Administered 2023-02-23: 25 mg via ORAL
  Filled 2023-02-23: qty 1

## 2023-02-23 MED ORDER — HYDRALAZINE HCL 25 MG PO TABS: 25.0000 mg | ORAL_TABLET | Freq: Four times a day (QID) | ORAL | Status: DC | PRN

## 2023-02-23 MED ORDER — HYDRALAZINE HCL 25 MG PO TABS: 25.0000 mg | ORAL_TABLET | Freq: Three times a day (TID) | ORAL | Status: DC

## 2023-02-23 MED ORDER — MELATONIN 5 MG PO TABS
5.0000 mg | ORAL_TABLET | Freq: Every day | ORAL | Status: DC
  Administered 2023-02-23 – 2023-02-24 (×2): 5 mg via ORAL
  Filled 2023-02-23 (×2): qty 1

## 2023-02-23 MED ORDER — LOSARTAN POTASSIUM 50 MG PO TABS
50.0000 mg | ORAL_TABLET | Freq: Every day | ORAL | Status: DC
  Administered 2023-02-24: 50 mg via ORAL
  Filled 2023-02-23: qty 1

## 2023-02-23 NOTE — Progress Notes (Signed)
PROGRESS NOTE    Ricky Rowland  BJY:[id number] DOB: 1945/10/22 DOA: 02/22/2023 PCP: [name], MD   Brief Narrative: No notes on file   Assessment and Plan:  Syncope Unclear etiology. Patient with a long history with prior cardiac workup. Most recently, patient with a Zio patch which captured episodes of SVT. Episode of syncope significant for no prodrome without seizure activity witnessed by bystanders. Syncope workup this admission significant for negative CT head. No hemodynamically significant arrhythmia noted on telemetry. Orthostatic vital signs normal. Transthoracic Echocardiogram with improved LVEF. Cardiology consulted with recommendations for no inpatient cardiac workup.  CAD Patient with recent PCI with stent placement in September 2024, three months prior to admission. Patient was recommended for DAPT for 12 months, but this was modified to Plavix monotherapy after a GI bleed episode in October 2024. -Continue Plavix  Gross hematuria Complicated by prostate cancer and Plavix use. Unclear if contributed to by Plavix. Hemoglobin of 9 three days prior to admission, now down to 8.4. Patient with ongoing gross hematuria per his report. -Will consult Urology in AM as we cannot stop Plavix secondary to CAD with recent PCI/stent placement  Gout -Continue allopurinol  History of prostate cancer -Continue Xtandi and Orgovyx  Primary hypertension Uncontrolled. -Increase to losartan 50 mg daily  DVT prophylaxis: SCDs Code Status:   Code Status: Full Code Family Communication: None at bedside Disposition Plan: Discharge possibly in 24 hours pending urology recommendations   Consultants:  Cardiology  Procedures:  Transthoracic Echocardiogram  Antimicrobials: None    Subjective: Patient reports no concerns this morning except for ongoing gross hematuria.  Objective: BP (!) 148/65 (BP Location: Left Arm)   Pulse 62   Temp 97.9 F (36.6 C) (Oral)    Resp 18   Ht 6\' 3"  (1.905 m)   Wt 95 kg   SpO2 97%   BMI 26.18 kg/m   Examination:  General exam: Appears calm and comfortable Respiratory system: Clear to auscultation. Respiratory effort normal. Cardiovascular system: S1 & S2 heard, RRR. No murmurs, rubs, gallops or clicks. Gastrointestinal system: Abdomen is nondistended, soft and nontender. Normal bowel sounds heard. Central nervous system: Alert and oriented. No focal neurological deficits. Musculoskeletal: No edema. No calf tenderness Skin: No cyanosis. No rashes Psychiatry: Judgement and insight appear normal. Mood & affect appropriate.    Data Reviewed: I have personally reviewed following labs and imaging studies  CBC Lab Results  Component Value Date   WBC 7.6 02/23/2023   RBC 2.97 (L) 02/23/2023   HGB 8.4 (L) 02/23/2023   HCT 26.3 (L) 02/23/2023   MCV 88.6 02/23/2023   MCH 28.3 02/23/2023   PLT 233 02/23/2023   MCHC 31.9 02/23/2023   RDW 14.7 02/23/2023   LYMPHSABS 1.2 02/22/2023   MONOABS 0.6 02/22/2023   EOSABS 0.0 02/22/2023   BASOSABS 0.0 02/22/2023     Last metabolic panel Lab Results  Component Value Date   NA 138 02/23/2023   K 3.9 02/23/2023   CL 105 02/23/2023   CO2 22 02/23/2023   BUN 27 (H) 02/23/2023   CREATININE 1.70 (H) 02/23/2023   GLUCOSE 142 (H) 02/23/2023   GFRNONAA 41 (L) 02/23/2023   GFRAA >60 07/11/2019   CALCIUM 9.7 02/23/2023   PHOS 4.7 (H) 12/02/2022   PROT 7.3 02/22/2023   ALBUMIN 3.5 02/22/2023   BILITOT 0.7 02/22/2023   ALKPHOS 70 02/22/2023   AST 21 02/22/2023   ALT 13 02/22/2023   ANIONGAP 11 02/23/2023  GFR: Estimated Creatinine Clearance: 42.1 mL/min (A) (by C-G formula based on SCr of 1.7 mg/dL (H)).  No results found for this or any previous visit (from the past 240 hours).    Radiology Studies: ECHOCARDIOGRAM COMPLETE Result Date: 02/23/2023    ECHOCARDIOGRAM REPORT   Patient Name:   Ricky BERNICK Sr. Date of Exam: 02/23/2023 Medical Rec #:   [id number]             Height:       75.0 in Accession #:    [id number]            Weight:       209.4 lb Date of Birth:  March 12, 1945             BSA:          2.238 m Patient Age:    77 years              BP:           183/90 mmHg Patient Gender: M                     HR:           57 bpm. Exam Location:  Inpatient Procedure: 2D Echo, 3D Echo, Cardiac Doppler and Color Doppler Indications:    Syncope R55  History:        Patient has prior history of Echocardiogram examinations, most                 recent 11/15/2022. CHF and Cardiomyopathy, Previous Myocardial                 Infarction and CAD, Pulmonary HTN and CKD, stage 3,                 Signs/Symptoms:Syncope; Risk Factors:Hypertension and                 Dyslipidemia.  Sonographer:    Lucendia Herrlich RCS Referring Phys: [id number] ROBERT DORRELL IMPRESSIONS  1. Left ventricular ejection fraction, by estimation, is 55 to 60%. The left ventricle has normal function. The left ventricle has no regional wall motion abnormalities. Left ventricular diastolic parameters are consistent with Grade I diastolic dysfunction (impaired relaxation).  2. Right ventricular systolic function is normal. The right ventricular size is normal.  3. The mitral valve is normal in structure. Trivial mitral valve regurgitation. No evidence of mitral stenosis.  4. The aortic valve is normal in structure. Aortic valve regurgitation is trivial. No aortic stenosis is present.  5. The inferior vena cava is normal in size with greater than 50% respiratory variability, suggesting right atrial pressure of 3 mmHg. FINDINGS  Left Ventricle: Left ventricular ejection fraction, by estimation, is 55 to 60%. The left ventricle has normal function. The left ventricle has no regional wall motion abnormalities. The left ventricular internal cavity size was normal in size. There is  no left ventricular hypertrophy. Left ventricular diastolic parameters are consistent with Grade I diastolic dysfunction  (impaired relaxation). Right Ventricle: The right ventricular size is normal. No increase in right ventricular wall thickness. Right ventricular systolic function is normal. Left Atrium: Left atrial size was normal in size. Right Atrium: Right atrial size was normal in size. Pericardium: There is no evidence of pericardial effusion. Mitral Valve: The mitral valve is normal in structure. Trivial mitral valve regurgitation. No evidence of mitral valve stenosis. Tricuspid Valve: The tricuspid valve is normal in structure. Tricuspid valve regurgitation  is trivial. No evidence of tricuspid stenosis. Aortic Valve: The aortic valve is normal in structure. Aortic valve regurgitation is trivial. No aortic stenosis is present. Aortic valve peak gradient measures 6.2 mmHg. Pulmonic Valve: The pulmonic valve was normal in structure. Pulmonic valve regurgitation is not visualized. No evidence of pulmonic stenosis. Aorta: The aortic root is normal in size and structure. Venous: The inferior vena cava is normal in size with greater than 50% respiratory variability, suggesting right atrial pressure of 3 mmHg. IAS/Shunts: No atrial level shunt detected by color flow Doppler.  LEFT VENTRICLE PLAX 2D LVIDd:         5.80 cm   Diastology LVIDs:         3.70 cm   LV e' medial:    6.53 cm/s LV PW:         1.10 cm   LV E/e' medial:  11.9 LV IVS:        0.80 cm   LV e' lateral:   9.64 cm/s LVOT diam:     2.10 cm   LV E/e' lateral: 8.1 LV SV:         82 LV SV Index:   37 LVOT Area:     3.46 cm                           3D Volume EF:                          3D EF:        61 %                          LV EDV:       215 ml                          LV ESV:       84 ml                          LV SV:        131 ml RIGHT VENTRICLE             IVC RV S prime:     17.70 cm/s  IVC diam: 1.90 cm TAPSE (M-mode): 2.6 cm LEFT ATRIUM             Index        RIGHT ATRIUM           Index LA diam:        4.10 cm 1.83 cm/m   RA Area:     17.55 cm LA Vol  (A2C):   53.5 ml 23.91 ml/m  RA Volume:   46.95 ml  20.98 ml/m LA Vol (A4C):   76.7 ml 34.27 ml/m LA Biplane Vol: 71.0 ml 31.73 ml/m  AORTIC VALVE AV Area (Vmax): 3.03 cm AV Vmax:        124.00 cm/s AV Peak Grad:   6.2 mmHg LVOT Vmax:      108.33 cm/s LVOT Vmean:     71.867 cm/s LVOT VTI:       0.238 m  AORTA Ao Root diam: 3.40 cm Ao Asc diam:  3.70 cm MITRAL VALVE                TRICUSPID VALVE MV Area (PHT): 2.76  cm     TR Peak grad:   27.5 mmHg MV Decel Time: 275 msec     TR Vmax:        262.00 cm/s MV E velocity: 77.90 cm/s MV A velocity: 107.00 cm/s  SHUNTS MV E/A ratio:  0.73         Systemic VTI:  0.24 m                             Systemic Diam: 2.10 cm Arvilla Meres MD Electronically signed by Arvilla Meres MD Signature Date/Time: 02/23/2023/12:31:31 PM    Final    CT HEAD WO CONTRAST ( ) Result Date: 02/22/2023 CLINICAL DATA:  Head trauma, minor (Age >= 65y); Neck trauma (Age >= 65y) EXAM: CT HEAD WITHOUT CONTRAST CT CERVICAL SPINE WITHOUT CONTRAST TECHNIQUE: Multidetector CT imaging of the head and cervical spine was performed following the standard protocol without intravenous contrast. Multiplanar CT image reconstructions of the cervical spine were also generated. RADIATION DOSE REDUCTION: This exam was performed according to the departmental dose-optimization program which includes automated exposure control, adjustment of the mA and/or kV according to patient size and/or use of iterative reconstruction technique. COMPARISON:  CT head and CT cervical spine 12/01/2022. FINDINGS: CT HEAD FINDINGS Brain: No evidence of acute infarction, hemorrhage, hydrocephalus, extra-axial collection or mass lesion/mass effect. 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 Vascular: Calcific atherosclerosis. No hyperdense vessel identified. Skull: No acute fracture. Sinuses/Orbits: Clear sinuses.  No acute orbital findings. Other: No mastoid effusions. CT CERVICAL SPINE FINDINGS Alignment:  Straightening.  No substantial sagittal subluxation. Skull base and vertebrae: No acute fracture. Vertebral body heights are maintained. Soft tissues and spinal canal: No prevertebral fluid or swelling. No visible canal hematoma. Disc levels:  Mild for age multilevel bony degenerative change. Upper chest: Visualized lung apices are clear. Other: Atherosclerosis. IMPRESSION: No evidence of acute abnormality intracranially or in the cervical spine. Electronically Signed   By: Feliberto Harts M.D.   On: 02/22/2023 12:10   CT Cervical Spine Wo Contrast Result Date: 02/22/2023 CLINICAL DATA:  Head trauma, minor (Age >= 65y); Neck trauma (Age >= 65y) EXAM: CT HEAD WITHOUT CONTRAST CT CERVICAL SPINE WITHOUT CONTRAST TECHNIQUE: Multidetector CT imaging of the head and cervical spine was performed following the standard protocol without intravenous contrast. Multiplanar CT image reconstructions of the cervical spine were also generated. RADIATION DOSE REDUCTION: This exam was performed according to the departmental dose-optimization program which includes automated exposure control, adjustment of the mA and/or kV according to patient size and/or use of iterative reconstruction technique. COMPARISON:  CT head and CT cervical spine 12/01/2022. FINDINGS: CT HEAD FINDINGS Brain: No evidence of acute infarction, hemorrhage, hydrocephalus, extra-axial collection or mass lesion/mass effect. 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 Vascular: Calcific atherosclerosis. No hyperdense vessel identified. Skull: No acute fracture. Sinuses/Orbits: Clear sinuses.  No acute orbital findings. Other: No mastoid effusions. CT CERVICAL SPINE FINDINGS Alignment: Straightening.  No substantial sagittal subluxation. Skull base and vertebrae: No acute fracture. Vertebral body heights are maintained. Soft tissues and spinal canal: No prevertebral fluid or swelling. No visible canal hematoma. Disc levels:  Mild for age multilevel  bony degenerative change. Upper chest: Visualized lung apices are clear. Other: Atherosclerosis. IMPRESSION: No evidence of acute abnormality intracranially or in the cervical spine. Electronically Signed   By: Feliberto Harts M.D.   On: 02/22/2023 12:10   DG Chest Portable 1 View Result Date: 02/22/2023  CLINICAL DATA:  Larey Seat EXAM: PORTABLE CHEST - 1 VIEW COMPARISON:  12/01/2022 FINDINGS: Lungs are clear.  No pneumothorax. Heart size and mediastinal contours are within normal limits. No effusion. Visualized bones unremarkable. IMPRESSION: No acute cardiopulmonary disease. Electronically Signed   By: Corlis Leak M.D.   On: 02/22/2023 11:58      LOS: 0 days    Jacquelin Hawking, MD [facility] 02/23/2023, 6:09 PM   If 7PM-7AM, please contact night-coverage [email or web address]

## 2023-02-23 NOTE — Discharge Instructions (Signed)
Ricky Panther Sr.,  You were in the hospital because of passing out. Your workup was negative for a reason for this episode. The cardiologist saw you and does not think it was related to an arrhythmia (abnormal heart beat). Please follow-up with your PCP.

## 2023-02-23 NOTE — Plan of Care (Signed)

## 2023-02-23 NOTE — Care Management Obs Status (Signed)
MEDICARE OBSERVATION STATUS NOTIFICATION   Patient Details  Name: Ricky KOGLIN Sr. MRN: [id number] Date of Birth: Dec 23, 1945   Medicare Observation Status Notification Given:  Yes    Ronny Bacon, RN 02/23/2023, 8:41 AM

## 2023-02-23 NOTE — Progress Notes (Signed)
Echocardiogram 2D Echocardiogram has been performed.  Ricky Rowland 02/23/2023, 12:24 PM

## 2023-02-23 NOTE — Consult Note (Addendum)
Cardiology Consultation   Patient ID: PATRIK GRUDZINSKI Sr. MRN: [id number]; DOB: 19-Sep-1945  Admit date: 02/22/2023 Date of Consult: 02/23/2023  PCP:  [name], MD   [facility] Providers Cardiologist:  Charlton Haws, MD        Patient Profile:   Leviticus Gupte. is a 77 y.o. male with a hx of monoclonal gammopathy of undetermined significance (under current observation), prostate cancer s/p radiotherapy on Xtandi, unexplained syncope, loop recorder explant 01/27/2022, spinal stenosis, CAD s/p LAD stent 11/16/22, carotid stenosis, CVA. Cardiology asked to consult for evaluation of recurrent syncope.   History of Present Illness:   Mr. Burruel presented to the hospital on 10/8 due to abnormal labs/HGB 6.7 and stools at home with melanotic appearance. This followed recent admission on 9/23 when patient was found to have active GI bleed (reported dark stools since 9/11) in the setting of syncope. Patient had undergone LHC on 11/16/2022 in the setting of NSTEMI and received DES in mid-proximal LAD. Following PCI, patient was taking Effient and ASA. During 9/23 admission, Effient was held. 9/24 EGD without source of bleeding and GI cleared patient to resume DAPT. Patient started Plavix in place of Effient and continued ASA. Patient seen again in consult 10/10 with recurrent GI bleeding. Of note, patient had ILR placed in 2019 after experiencing several episodes of syncope. Through explant in November of 2023, no significant arrhythmias ever recorded. More recently patient wore a 14 day Zio that was also without arrhythmia.   Today on exam, patient and spouse both provided HPI. They describe recent syncope/fall occurring as patient was sitting on a bar stool taking medications. Patient believes he lost consciousness but also reports remembering hitting his head on the floor. He denies any prodromal symptoms including lightheadedness or dizziness. I spent significant time also  discussing previous syncopal episodes with patient and spouse. She describes many of these as blackout type episodes. She describes times when she will notice that the patient seems to develop a fixed gaze and won't initially respond to voice. She says that if she keeps talking to him, he eventually begins to return to normal, usually within 30 sec to one minute. I asked patient and spouse if any of these episodes occurred during the nearly 4 year period that he had an ILR. Neither are sure.   In addition to recent syncope/fall, patient reports noticing mild chest tightness that occurs with exertion and improves with rest. This has not been severe enough to prompt him to take nitroglycerin. He separately reports seeing blood in urine intermittently for the past week.    Past Medical History:  Diagnosis Date   Acute kidney injury superimposed on chronic kidney disease (HCC) 11/19/2022   Borderline diabetes    DIET CONTROLLED   Bradycardia 11/17/2022   Dyslipidemia    Elevated PSA    External carotid artery stenosis    MILD-- BILATERAL   Fall 11/05/2017   GERD (gastroesophageal reflux disease)    Gout, arthritis    Hearing loss in left ear    Heart murmur    MILD --  ASYMPTOMATIC   History of gastric ulcer    REMOTE HX   Hypertension    Pre-diabetes    Prostate cancer (HCC) 12/21/2012   Gleason 3+4=7   Seizure-like activity (HCC)    Syncope and collapse    Wears glasses     Past Surgical History:  Procedure Laterality Date   CORONARY STENT INTERVENTION N/A 11/16/2022  Procedure: CORONARY STENT INTERVENTION;  Surgeon: Elder Negus, MD;  Location: [facility];  Service: Cardiovascular;  Laterality: N/A;   CORONARY ULTRASOUND/IVUS N/A 11/16/2022   Procedure: Coronary Ultrasound/IVUS;  Surgeon: Elder Negus, MD;  Location: [facility];  Service: Cardiovascular;  Laterality: N/A;   ENTEROSCOPY N/A 12/21/2022   Procedure: ENTEROSCOPY;  Surgeon: Vida Rigger,  MD;  Location: [facility] ENDOSCOPY;  Service: Gastroenterology;  Laterality: N/A;   ESOPHAGOGASTRODUODENOSCOPY (EGD) WITH PROPOFOL N/A 12/02/2022   Procedure: ESOPHAGOGASTRODUODENOSCOPY (EGD) WITH PROPOFOL;  Surgeon: Charlott Rakes, MD;  Location: [facility] ENDOSCOPY;  Service: Gastroenterology;  Laterality: N/A;   ESOPHAGOGASTRODUODENOSCOPY (EGD) WITH PROPOFOL N/A 12/18/2022   Procedure: ESOPHAGOGASTRODUODENOSCOPY (EGD) WITH PROPOFOL;  Surgeon: Kerin Salen, MD;  Location: [facility] ENDOSCOPY;  Service: Gastroenterology;  Laterality: N/A;   GIVENS CAPSULE STUDY N/A 12/18/2022   Procedure: GIVENS CAPSULE STUDY;  Surgeon: Kerin Salen, MD;  Location: [facility] ENDOSCOPY;  Service: Gastroenterology;  Laterality: N/A;   HOT HEMOSTASIS N/A 12/21/2022   Procedure: HOT HEMOSTASIS (ARGON PLASMA COAGULATION/BICAP);  Surgeon: Vida Rigger, MD;  Location: [facility] ENDOSCOPY;  Service: Gastroenterology;  Laterality: N/A;  Straight Fire Probe   KNEE ARTHROSCOPY W/ MENISCAL REPAIR Left 03/10/2012   LEFT HEART CATH AND CORONARY ANGIOGRAPHY N/A 11/16/2022   Procedure: LEFT HEART CATH AND CORONARY ANGIOGRAPHY;  Surgeon: Elder Negus, MD;  Location: [facility];  Service: Cardiovascular;  Laterality: N/A;   LOOP RECORDER INSERTION N/A 01/05/2018   Procedure: LOOP RECORDER INSERTION;  Surgeon: Duke Salvia, MD;  Location: [facility] INVASIVE CV LAB;  Service: Cardiovascular;  Laterality: N/A;   LUMBAR SPINE SURGERY  03/10/1989   PROSTATE BIOPSY N/A 12/21/2012   Procedure: BIOPSY TRANSRECTAL ULTRASONIC PROSTATE (TUBP);  Surgeon: Lindaann Slough, MD;  Location: [facility];  Service: Urology;  Laterality: N/A;   PROSTATE BIOPSY  11/26/2011   benign   TEMPORARY PACEMAKER N/A 11/16/2022   Procedure: TEMPORARY PACEMAKER;  Surgeon: Elder Negus, MD;  Location: [facility];  Service: Cardiovascular;  Laterality: N/A;   TONSILLECTOMY     WISDOM TOOTH EXTRACTION       Home Medications:  Prior to Admission  medications   Medication Sig Start Date End Date Taking? Authorizing Provider  acetaminophen (TYLENOL) 650 MG CR tablet Take 650 mg by mouth every 8 (eight) hours as needed for pain.   Yes [provider]  allopurinol (ZYLOPRIM) 300 MG tablet Take 300 mg by mouth daily.   Yes [provider]  clopidogrel (PLAVIX) 75 MG tablet Take 75 mg by mouth daily. 01/05/23  Yes [provider]  docusate sodium (COLACE) 100 MG capsule Take 2 capsules (200 mg total) by mouth daily as needed for mild constipation. 12/05/22  Yes Leroy Sea, MD  ferrous sulfate 325 (65 FE) MG tablet Take 325 mg by mouth daily with breakfast.   Yes [provider]  Multiple Vitamins-Minerals (MULTIVITAMIN ADULT EXTRA C PO) Take 1 tablet by mouth daily. 07/24/22  Yes [provider]  nitroGLYCERIN (NITROSTAT) 0.4 MG SL tablet Place 1 tablet (0.4 mg total) under the tongue every 5 (five) minutes as needed for chest pain (up to 3 doses). 11/19/22  Yes Dunn, Dayna N, PA-C  ORGOVYX 120 MG tablet Take 120 mg by mouth daily. 05/19/22  Yes [provider]  pantoprazole (PROTONIX) 40 MG tablet Take 1 tablet (40 mg total) by mouth 2 (two) times daily before a meal. 12/22/22 03/22/23 Yes Carollee Herter, DO  polyethylene glycol  powder (GLYCOLAX/MIRALAX) 17 GM/SCOOP powder Take 1 capful (17 g) with water by mouth daily as needed for mild constipation. 12/05/22  Yes Leroy Sea, MD  REPATHA SURECLICK 140 MG/ML SOAJ Inject 140 mg into the skin every 14 (fourteen) days.  06/07/17  Yes [provider]  tolnaftate (TINACTIN) 1 % cream Apply 1 application topically daily as needed (jock itch).   Yes [provider]  XTANDI 40 MG capsule Take 160 mg by mouth daily.   Yes [provider]  losartan (COZAAR) 25 MG tablet Take 1 tablet (25 mg total) by mouth daily. Patient not taking: Reported on 02/22/2023 12/24/22 03/24/23  Dorthula Nettles, DO    Inpatient  Medications: Scheduled Meds:  allopurinol  300 mg Oral Daily   enoxaparin (LOVENOX) injection  40 mg Subcutaneous Q24H   enzalutamide  160 mg Oral Daily   [START ON 02/24/2023] losartan  50 mg Oral Daily   pantoprazole  40 mg Oral BID AC   relugolix  120 mg Oral Daily   Continuous Infusions:  PRN Meds: acetaminophen **OR** acetaminophen, hydrALAZINE, ondansetron **OR** ondansetron (ZOFRAN) IV  Allergies:    Allergies  Allergen Reactions   Ezetimibe Other (See Comments)    Muscle aches   Statins Other (See Comments)    Joint pain    Social History:   Social History   Socioeconomic History   Marital status: Married    Spouse name: geraldine   Number of children: 3   Years of education: some college   Highest education level: High school graduate  Occupational History   Occupation: retired  Tobacco Use   Smoking status: Former    Current packs/day: 0.00    Average packs/day: 1 pack/day for 30.0 years (30.0 ttl pk-yrs)    Types: Cigarettes    Start date: 12/16/1967    Quit date: 12/15/1997    Years since quitting: 25.2   Smokeless tobacco: Never  Vaping Use   Vaping status: Never Used  Substance and Sexual Activity   Alcohol use: Not Currently    Alcohol/week: 20.0 standard drinks of alcohol    Types: 20 Shots of liquor per week    Comment: OCCASIONAL, 12/28/17 stopped 11/05/17   Drug use: No   Sexual activity: Not on file  Other Topics Concern   Not on file  Social History Narrative   Lives with wife at home    caffeine use- occas, mostly decaf coffee   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    Social Drivers of Health   Financial Resource Strain: Low Risk  (11/18/2022)   Overall Financial Resource Strain (CARDIA)    Difficulty of Paying Living Expenses: Not hard at all  Food Insecurity: No Food Insecurity (02/22/2023)   Hunger Vital Sign    Worried About Running Out of Food in the Last Year: Never true    Ran Out of Food in the Last Year: Never true  Transportation Needs: No  Transportation Needs (02/22/2023)   PRAPARE - Administrator, Civil Service (Medical): No    Lack of Transportation (Non-Medical): No  Physical Activity: Not on file  Stress: Not on file  Social Connections: Not on file  Intimate Partner Violence: Not At Risk (02/22/2023)   Humiliation, Afraid, Rape, and Kick questionnaire    Fear of Current or Ex-Partner: No    Emotionally Abused: No    Physically Abused: No    Sexually Abused: No    Family History:    Family History  Problem  Relation Age of Onset   Heart disease Mother    Heart disease Father    Cancer Sister        lung   Cancer Brother        prostate     ROS:  Please see the history of present illness.   All other ROS reviewed and negative.     Physical Exam/Data:   Vitals:   02/22/23 2052 02/23/23 0554 02/23/23 0807 02/23/23 1033  BP: 130/72 (!) 170/77 (!) 183/90 134/62  Pulse: 69 (!) 52 (!) 54 62  Resp: 17 18 18 18   Temp: 99.2 F (37.3 C) 97.9 F (36.6 C) 97.9 F (36.6 C)   TempSrc: Oral Oral Oral   SpO2: 100% 99% 100% 100%  Weight:      Height:        Intake/Output Summary (Last 24 hours) at 02/23/2023 1518 Last data filed at 02/23/2023 0945 Gross per 24 hour  Intake 537 ml  Output 600 ml  Net -63 ml      02/22/2023   11:22 AM 12/24/2022   10:59 AM 12/21/2022    7:25 AM  Last 3 Weights  Weight (lbs) 209 lb 7 oz 207 lb 12.8 oz 202 lb 2.6 oz  Weight (kg) 95 kg 94.257 kg 91.7 kg     Body mass index is 26.18 kg/m.  General:  Well nourished, well developed, in no acute distress HEENT: normal Neck: no JVD Vascular: No carotid bruits; Distal pulses 2+ bilaterally Cardiac:  normal S1, S2; RRR; no murmur Lungs:  clear to auscultation bilaterally, no wheezing, rhonchi or rales  Abd: soft, nontender, no hepatomegaly  Ext: no edema Musculoskeletal:  No deformities, BUE and BLE strength normal and equal Skin: warm and dry  Neuro:  CNs 2-12 intact, no focal abnormalities noted Psych:   Normal affect   EKG:  The EKG was personally reviewed and demonstrates:  sinus rhythm with frequent PACs Telemetry:  Telemetry was personally reviewed and demonstrates:  sinus rhythm with frequent PACs  Relevant CV Studies:  02/23/23 TTE  IMPRESSIONS     1. Left ventricular ejection fraction, by estimation, is 55 to 60%. The  left ventricle has normal function. The left ventricle has no regional  wall motion abnormalities. Left ventricular diastolic parameters are  consistent with Grade I diastolic  dysfunction (impaired relaxation).   2. Right ventricular systolic function is normal. The right ventricular  size is normal.   3. The mitral valve is normal in structure. Trivial mitral valve  regurgitation. No evidence of mitral stenosis.   4. The aortic valve is normal in structure. Aortic valve regurgitation is  trivial. No aortic stenosis is present.   5. The inferior vena cava is normal in size with greater than 50%  respiratory variability, suggesting right atrial pressure of 3 mmHg.   FINDINGS   Left Ventricle: Left ventricular ejection fraction, by estimation, is 55  to 60%. The left ventricle has normal function. The left ventricle has no  regional wall motion abnormalities. The left ventricular internal cavity  size was normal in size. There is   no left ventricular hypertrophy. Left ventricular diastolic parameters  are consistent with Grade I diastolic dysfunction (impaired relaxation).   Right Ventricle: The right ventricular size is normal. No increase in  right ventricular wall thickness. Right ventricular systolic function is  normal.   Left Atrium: Left atrial size was normal in size.   Right Atrium: Right atrial size was normal in size.  Pericardium: There is no evidence of pericardial effusion.   Mitral Valve: The mitral valve is normal in structure. Trivial mitral  valve regurgitation. No evidence of mitral valve stenosis.   Tricuspid Valve: The  tricuspid valve is normal in structure. Tricuspid  valve regurgitation is trivial. No evidence of tricuspid stenosis.   Aortic Valve: The aortic valve is normal in structure. Aortic valve  regurgitation is trivial. No aortic stenosis is present. Aortic valve peak  gradient measures 6.2 mmHg.   Pulmonic Valve: The pulmonic valve was normal in structure. Pulmonic valve  regurgitation is not visualized. No evidence of pulmonic stenosis.   Aorta: The aortic root is normal in size and structure.   Venous: The inferior vena cava is normal in size with greater than 50%  respiratory variability, suggesting right atrial pressure of 3 mmHg.   IAS/Shunts: No atrial level shunt detected by color flow Doppler.   Laboratory Data:  High Sensitivity Troponin:   Recent Labs  Lab 02/22/23 1116  TROPONINIHS 15     Chemistry Recent Labs  Lab 02/22/23 1116 02/22/23 1129 02/23/23 0821  NA 136 137 138  K 4.5 4.5 3.9  CL 105 107 105  CO2 20*  --  22  GLUCOSE 146* 143* 142*  BUN 31* 31* 27*  CREATININE 2.09* 2.00* 1.70*  CALCIUM 10.0  --  9.7  GFRNONAA 32*  --  41*  ANIONGAP 11  --  11    Recent Labs  Lab 02/22/23 1116  PROT 7.3  ALBUMIN 3.5  AST 21  ALT 13  ALKPHOS 70  BILITOT 0.7   Lipids No results for input(s): "CHOL", "TRIG", "HDL", "LABVLDL", "LDLCALC", "CHOLHDL" in the last 168 hours.  Hematology Recent Labs  Lab 02/22/23 1116 02/22/23 1129 02/23/23 1003  WBC 6.8  --  7.6  RBC 2.96*  --  2.97*  HGB 8.4* 8.5* 8.4*  HCT 26.9* 25.0* 26.3*  MCV 90.9  --  88.6  MCH 28.4  --  28.3  MCHC 31.2  --  31.9  RDW 14.9  --  14.7  PLT 235  --  233   Thyroid No results for input(s): "TSH", "FREET4" in the last 168 hours.  BNP Recent Labs  Lab 02/22/23 1116  BNP 220.5*    DDimer No results for input(s): "DDIMER" in the last 168 hours.   Radiology/Studies:  ECHOCARDIOGRAM COMPLETE Result Date: 02/23/2023    ECHOCARDIOGRAM REPORT   Patient Name:   TREVIONNE SANTOR Sr.  Date of Exam: 02/23/2023 Medical Rec #:  [id number]             Height:       75.0 in Accession #:    [id number]            Weight:       209.4 lb Date of Birth:  11/13/1945             BSA:          2.238 m Patient Age:    62 years              BP:           183/90 mmHg Patient Gender: M                     HR:           57 bpm. Exam Location:  Inpatient Procedure: 2D Echo, 3D Echo, Cardiac Doppler and Color Doppler Indications:    Syncope  R55  History:        Patient has prior history of Echocardiogram examinations, most                 recent 11/15/2022. CHF and Cardiomyopathy, Previous Myocardial                 Infarction and CAD, Pulmonary HTN and CKD, stage 3,                 Signs/Symptoms:Syncope; Risk Factors:Hypertension and                 Dyslipidemia.  Sonographer:    Lucendia Herrlich RCS Referring Phys: [id number] ROBERT DORRELL IMPRESSIONS  1. Left ventricular ejection fraction, by estimation, is 55 to 60%. The left ventricle has normal function. The left ventricle has no regional wall motion abnormalities. Left ventricular diastolic parameters are consistent with Grade I diastolic dysfunction (impaired relaxation).  2. Right ventricular systolic function is normal. The right ventricular size is normal.  3. The mitral valve is normal in structure. Trivial mitral valve regurgitation. No evidence of mitral stenosis.  4. The aortic valve is normal in structure. Aortic valve regurgitation is trivial. No aortic stenosis is present.  5. The inferior vena cava is normal in size with greater than 50% respiratory variability, suggesting right atrial pressure of 3 mmHg. FINDINGS  Left Ventricle: Left ventricular ejection fraction, by estimation, is 55 to 60%. The left ventricle has normal function. The left ventricle has no regional wall motion abnormalities. The left ventricular internal cavity size was normal in size. There is  no left ventricular hypertrophy. Left ventricular diastolic parameters are consistent  with Grade I diastolic dysfunction (impaired relaxation). Right Ventricle: The right ventricular size is normal. No increase in right ventricular wall thickness. Right ventricular systolic function is normal. Left Atrium: Left atrial size was normal in size. Right Atrium: Right atrial size was normal in size. Pericardium: There is no evidence of pericardial effusion. Mitral Valve: The mitral valve is normal in structure. Trivial mitral valve regurgitation. No evidence of mitral valve stenosis. Tricuspid Valve: The tricuspid valve is normal in structure. Tricuspid valve regurgitation is trivial. No evidence of tricuspid stenosis. Aortic Valve: The aortic valve is normal in structure. Aortic valve regurgitation is trivial. No aortic stenosis is present. Aortic valve peak gradient measures 6.2 mmHg. Pulmonic Valve: The pulmonic valve was normal in structure. Pulmonic valve regurgitation is not visualized. No evidence of pulmonic stenosis. Aorta: The aortic root is normal in size and structure. Venous: The inferior vena cava is normal in size with greater than 50% respiratory variability, suggesting right atrial pressure of 3 mmHg. IAS/Shunts: No atrial level shunt detected by color flow Doppler.  LEFT VENTRICLE PLAX 2D LVIDd:         5.80 cm   Diastology LVIDs:         3.70 cm   LV e' medial:    6.53 cm/s LV PW:         1.10 cm   LV E/e' medial:  11.9 LV IVS:        0.80 cm   LV e' lateral:   9.64 cm/s LVOT diam:     2.10 cm   LV E/e' lateral: 8.1 LV SV:         82 LV SV Index:   37 LVOT Area:     3.46 cm  3D Volume EF:                          3D EF:        61 %                          LV EDV:       215 ml                          LV ESV:       84 ml                          LV SV:        131 ml RIGHT VENTRICLE             IVC RV S prime:     17.70 cm/s  IVC diam: 1.90 cm TAPSE (M-mode): 2.6 cm LEFT ATRIUM             Index        RIGHT ATRIUM           Index LA diam:        4.10 cm 1.83  cm/m   RA Area:     17.55 cm LA Vol (A2C):   53.5 ml 23.91 ml/m  RA Volume:   46.95 ml  20.98 ml/m LA Vol (A4C):   76.7 ml 34.27 ml/m LA Biplane Vol: 71.0 ml 31.73 ml/m  AORTIC VALVE AV Area (Vmax): 3.03 cm AV Vmax:        124.00 cm/s AV Peak Grad:   6.2 mmHg LVOT Vmax:      108.33 cm/s LVOT Vmean:     71.867 cm/s LVOT VTI:       0.238 m  AORTA Ao Root diam: 3.40 cm Ao Asc diam:  3.70 cm MITRAL VALVE                TRICUSPID VALVE MV Area (PHT): 2.76 cm     TR Peak grad:   27.5 mmHg MV Decel Time: 275 msec     TR Vmax:        262.00 cm/s MV E velocity: 77.90 cm/s MV A velocity: 107.00 cm/s  SHUNTS MV E/A ratio:  0.73         Systemic VTI:  0.24 m                             Systemic Diam: 2.10 cm Arvilla Meres MD Electronically signed by Arvilla Meres MD Signature Date/Time: 02/23/2023/12:31:31 PM    Final    CT HEAD WO CONTRAST ( ) Result Date: 02/22/2023 CLINICAL DATA:  Head trauma, minor (Age >= 65y); Neck trauma (Age >= 65y) EXAM: CT HEAD WITHOUT CONTRAST CT CERVICAL SPINE WITHOUT CONTRAST TECHNIQUE: Multidetector CT imaging of the head and cervical spine was performed following the standard protocol without intravenous contrast. Multiplanar CT image reconstructions of the cervical spine were also generated. RADIATION DOSE REDUCTION: This exam was performed according to the departmental dose-optimization program which includes automated exposure control, adjustment of the mA and/or kV according to patient size and/or use of iterative reconstruction technique. COMPARISON:  CT head and CT cervical spine 12/01/2022. FINDINGS: CT HEAD FINDINGS Brain: No evidence of acute infarction, hemorrhage, hydrocephalus, extra-axial collection or mass lesion/mass effect. 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 Vascular: Calcific atherosclerosis. No hyperdense vessel identified. Skull: No acute fracture. Sinuses/Orbits: Clear sinuses.  No acute orbital findings. Other: No mastoid effusions. CT  CERVICAL SPINE FINDINGS Alignment: Straightening.  No substantial sagittal subluxation. Skull base and vertebrae: No acute fracture. Vertebral body heights are maintained. Soft tissues and spinal canal: No prevertebral fluid or swelling. No visible canal hematoma. Disc levels:  Mild for age multilevel bony degenerative change. Upper chest: Visualized lung apices are clear. Other: Atherosclerosis. IMPRESSION: No evidence of acute abnormality intracranially or in the cervical spine. Electronically Signed   By: Feliberto Harts M.D.   On: 02/22/2023 12:10   CT Cervical Spine Wo Contrast Result Date: 02/22/2023 CLINICAL DATA:  Head trauma, minor (Age >= 65y); Neck trauma (Age >= 65y) EXAM: CT HEAD WITHOUT CONTRAST CT CERVICAL SPINE WITHOUT CONTRAST TECHNIQUE: Multidetector CT imaging of the head and cervical spine was performed following the standard protocol without intravenous contrast. Multiplanar CT image reconstructions of the cervical spine were also generated. RADIATION DOSE REDUCTION: This exam was performed according to the departmental dose-optimization program which includes automated exposure control, adjustment of the mA and/or kV according to patient size and/or use of iterative reconstruction technique. COMPARISON:  CT head and CT cervical spine 12/01/2022. FINDINGS: CT HEAD FINDINGS Brain: No evidence of acute infarction, hemorrhage, hydrocephalus, extra-axial collection or mass lesion/mass effect. 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 Vascular: Calcific atherosclerosis. No hyperdense vessel identified. Skull: No acute fracture. Sinuses/Orbits: Clear sinuses.  No acute orbital findings. Other: No mastoid effusions. CT CERVICAL SPINE FINDINGS Alignment: Straightening.  No substantial sagittal subluxation. Skull base and vertebrae: No acute fracture. Vertebral body heights are maintained. Soft tissues and spinal canal: No prevertebral fluid or swelling. No visible canal hematoma. Disc  levels:  Mild for age multilevel bony degenerative change. Upper chest: Visualized lung apices are clear. Other: Atherosclerosis. IMPRESSION: No evidence of acute abnormality intracranially or in the cervical spine. Electronically Signed   By: Feliberto Harts M.D.   On: 02/22/2023 12:10   DG Chest Portable 1 View Result Date: 02/22/2023 CLINICAL DATA:  Larey Seat EXAM: PORTABLE CHEST - 1 VIEW COMPARISON:  12/01/2022 FINDINGS: Lungs are clear.  No pneumothorax. Heart size and mediastinal contours are within normal limits. No effusion. Visualized bones unremarkable. IMPRESSION: No acute cardiopulmonary disease. Electronically Signed   By: Corlis Leak M.D.   On: 02/22/2023 11:58     Assessment and Plan:   Syncope   Patient with syncope prior to 9/23 admission and intermittently since at least 2019. He had ILR from 2019-2023 and I am unable to find any PACEART interrogations for this entire duration suggestive of significant arrhythmia. 14 day zio this year also without significant arrhythmia. Although patient has frequent PACs on telemetry, ECG, and physical exams, would not expect syncope without prodrome. Would strongly consider neurological evaluation, this is more likely neurogenic given weight of evidence against arrhythmia. Will discuss whether there would be benefit to another heart monitor or ILR with Dr. Elease Hashimoto.   CAD s/p prox-mid LAD DES placed 11/16/22 Acute blood loss anemia   S/P LHC on 11/16/2022 in the setting of NSTEMI and received [employer] Synergy 3.5x20 in mid-proximal LAD. Initial DAPT with Effient and ASA, switched to Plavix and ASA following 9/23 admission with GIB of unidentified source. Patient then back in the hospital with ongoing melanotic stools and acute anemia in October with HGB 6.7->7.4->7.8. At discharge, he was transitioned to Plavix only.   Continue Plavix  with daily PPI. Will defer workup of hematuria to primary team.  Consider low dose nitrate for exertional angina.   Continue Repatha   Chronic systolic heart failure   EF 40 to 45% on echo 11/15/2022. Euvolemic on physical exam today. Repeat TTE today shows return to normal LVEF, 55-60%   Continue Losartan No beta blocker with intermittent bradycardia Consider SGLT2   Risk Assessment/Risk Scores:        New York Heart Association (NYHA) Functional Class NYHA Class I        For questions or updates, please contact [facility] Please consult [email or web address] for contact info under    Signed, Perlie Gold, PA-C  02/23/2023 3:18 PM   Attending Note:   The patient was seen and examined.  Agree with assessment and plan as noted above.  Changes made to the above note as needed.  Patient seen and independently examined with Perlie Gold, PA .   We discussed all aspects of the encounter. I agree with the assessment and plan as stated above.    "  Syncopal episodes "  Patient presents with spells that have been present for at least 5 years.  He has episodes frequently are described as the patient staring off into the distance and not responding to others around him.  He also has episodes where he completely loses consciousness and falls to the ground.  For example he fell off of the barstool earlier today which resulted in this admission.  He had an implantable loop recorder for 4 years from 2019-2023.  None of the [employer] interpretation identified any arrhythmia that would be causing these episodes.  I suspect that these are due to a neurologic etiology.  Perhaps a partial seizure of some sort.  I would suggest that he see neurology and have a complete seizure workup.  I have discussed the case with electrophysiology they suggested watchful waiting for the time being.  He is okay for discharge tonight.  He will follow-up with Dr. Eden Emms  and Dr. Graciela Husbands.       I have spent a total of 40 minutes with patient reviewing hospital  notes , telemetry, EKGs, labs and examining patient as  well as establishing an assessment and plan that was discussed with the patient.  > 50% of time was spent in direct patient care.    Vesta Mixer, Montez Hageman., MD, [facility] 02/23/2023, 6:04 PM [address]  [address] Office [phone] Pager [phone]

## 2023-02-23 NOTE — Progress Notes (Signed)
Patient ID: Ricky Rowland., male   DOB: September 08, 1945, 78 y.o.   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

## 2023-02-24 ENCOUNTER — Observation Stay (HOSPITAL_COMMUNITY): Payer: Medicare Other

## 2023-02-24 ENCOUNTER — Encounter (HOSPITAL_COMMUNITY): Payer: Medicare Other

## 2023-02-24 ENCOUNTER — Encounter (HOSPITAL_COMMUNITY): Payer: Self-pay

## 2023-02-24 DIAGNOSIS — I2510 Atherosclerotic heart disease of native coronary artery without angina pectoris: Secondary | ICD-10-CM | POA: Diagnosis present

## 2023-02-24 DIAGNOSIS — N189 Chronic kidney disease, unspecified: Secondary | ICD-10-CM | POA: Diagnosis present

## 2023-02-24 DIAGNOSIS — Z87891 Personal history of nicotine dependence: Secondary | ICD-10-CM | POA: Diagnosis not present

## 2023-02-24 DIAGNOSIS — Y92009 Unspecified place in unspecified non-institutional (private) residence as the place of occurrence of the external cause: Secondary | ICD-10-CM | POA: Diagnosis not present

## 2023-02-24 DIAGNOSIS — R310 Gross hematuria: Secondary | ICD-10-CM | POA: Diagnosis present

## 2023-02-24 DIAGNOSIS — I951 Orthostatic hypotension: Secondary | ICD-10-CM | POA: Diagnosis not present

## 2023-02-24 DIAGNOSIS — Y92239 Unspecified place in hospital as the place of occurrence of the external cause: Secondary | ICD-10-CM | POA: Diagnosis not present

## 2023-02-24 DIAGNOSIS — T45525A Adverse effect of antithrombotic drugs, initial encounter: Secondary | ICD-10-CM | POA: Diagnosis present

## 2023-02-24 DIAGNOSIS — D62 Acute posthemorrhagic anemia: Secondary | ICD-10-CM | POA: Diagnosis present

## 2023-02-24 DIAGNOSIS — H9192 Unspecified hearing loss, left ear: Secondary | ICD-10-CM | POA: Diagnosis present

## 2023-02-24 DIAGNOSIS — I471 Supraventricular tachycardia, unspecified: Secondary | ICD-10-CM | POA: Diagnosis present

## 2023-02-24 DIAGNOSIS — R55 Syncope and collapse: Secondary | ICD-10-CM | POA: Diagnosis present

## 2023-02-24 DIAGNOSIS — C61 Malignant neoplasm of prostate: Secondary | ICD-10-CM | POA: Diagnosis present

## 2023-02-24 DIAGNOSIS — Z79899 Other long term (current) drug therapy: Secondary | ICD-10-CM | POA: Diagnosis not present

## 2023-02-24 DIAGNOSIS — Z8673 Personal history of transient ischemic attack (TIA), and cerebral infarction without residual deficits: Secondary | ICD-10-CM | POA: Diagnosis not present

## 2023-02-24 DIAGNOSIS — Z9181 History of falling: Secondary | ICD-10-CM | POA: Diagnosis not present

## 2023-02-24 DIAGNOSIS — Z8711 Personal history of peptic ulcer disease: Secondary | ICD-10-CM | POA: Diagnosis not present

## 2023-02-24 DIAGNOSIS — T465X5A Adverse effect of other antihypertensive drugs, initial encounter: Secondary | ICD-10-CM | POA: Diagnosis not present

## 2023-02-24 DIAGNOSIS — Z7902 Long term (current) use of antithrombotics/antiplatelets: Secondary | ICD-10-CM | POA: Diagnosis not present

## 2023-02-24 DIAGNOSIS — E785 Hyperlipidemia, unspecified: Secondary | ICD-10-CM | POA: Diagnosis present

## 2023-02-24 DIAGNOSIS — M109 Gout, unspecified: Secondary | ICD-10-CM | POA: Diagnosis present

## 2023-02-24 DIAGNOSIS — I130 Hypertensive heart and chronic kidney disease with heart failure and stage 1 through stage 4 chronic kidney disease, or unspecified chronic kidney disease: Secondary | ICD-10-CM | POA: Diagnosis present

## 2023-02-24 DIAGNOSIS — Z955 Presence of coronary angioplasty implant and graft: Secondary | ICD-10-CM | POA: Diagnosis not present

## 2023-02-24 DIAGNOSIS — I5022 Chronic systolic (congestive) heart failure: Secondary | ICD-10-CM | POA: Diagnosis present

## 2023-02-24 DIAGNOSIS — Z888 Allergy status to other drugs, medicaments and biological substances status: Secondary | ICD-10-CM | POA: Diagnosis not present

## 2023-02-24 DIAGNOSIS — E1122 Type 2 diabetes mellitus with diabetic chronic kidney disease: Secondary | ICD-10-CM | POA: Diagnosis present

## 2023-02-24 LAB — CBC
HCT: 26.1 % — ABNORMAL LOW (ref 39.0–52.0)
Hemoglobin: 8.3 g/dL — ABNORMAL LOW (ref 13.0–17.0)
MCH: 28.3 pg (ref 26.0–34.0)
MCHC: 31.8 g/dL (ref 30.0–36.0)
MCV: 89.1 fL (ref 80.0–100.0)
Platelets: 229 10*3/uL (ref 150–400)
RBC: 2.93 MIL/uL — ABNORMAL LOW (ref 4.22–5.81)
RDW: 14.8 % (ref 11.5–15.5)
WBC: 6.5 10*3/uL (ref 4.0–10.5)
nRBC: 0 % (ref 0.0–0.2)

## 2023-02-24 MED ORDER — IOHEXOL 350 MG/ML SOLN
100.0000 mL | Freq: Once | INTRAVENOUS | Status: AC | PRN
  Administered 2023-02-24: 100 mL via INTRAVENOUS

## 2023-02-24 NOTE — Progress Notes (Signed)
PROGRESS NOTE    Ricky Rowland  VZD:[id number] DOB: 1945/06/14 DOA: 02/22/2023 PCP: [name], MD   Brief Narrative: Ricky Rowland. is a 77 y.o. male with a history of diabetes, GERD, primary pretension, prostate cancer, CAD status post stent placement in September 2024, recurrent syncope.  Patient presented secondary to syncopal event.  Workup so far has been negative for etiology.  Concern for possible neurologic etiology with family concern for possible seizures.   Assessment and Plan:  Syncope Unclear etiology. Patient with a long history with prior cardiac workup. Most recently, patient with a Zio patch which captured episodes of SVT. Episode of syncope significant for no prodrome without seizure activity witnessed by bystanders. Syncope workup this admission significant for negative CT head. No hemodynamically significant arrhythmia noted on telemetry. Orthostatic vital signs normal. Transthoracic Echocardiogram with improved LVEF. Cardiology consulted with recommendations for no inpatient cardiac workup. After discussion with family, concern there may be a neurologic etiology as patient has had episode of staring and memory lapses with these episodes. -Check MRI and EEG -Check carotid ultrasound  CAD Patient with recent PCI with stent placement in September 2024, three months prior to admission. Patient was recommended for DAPT for 12 months, but this was modified to Plavix monotherapy after a GI bleed episode in October 2024. -Continue Plavix  Gross hematuria Complicated by prostate cancer and Plavix use. Unclear if contributed to by Plavix. Hemoglobin of 9 three days prior to admission, now down to 8.3g/dL. Patient with ongoing gross hematuria per his report. -Urology recommendation: CT hematuria scan  Gout -Continue allopurinol  History of prostate cancer -Continue Xtandi and Orgovyx  Primary hypertension Uncontrolled. -Discontinue losartan for  now  Orthostatic hypotension Patient is positive for orthostatic hypotension today. Possibly related to increase in losartan. Patient with symptoms of lightheadedness. -Hold losartan and recheck orthostatic vitals in AM  DVT prophylaxis: SCDs Code Status:   Code Status: Full Code Family Communication: None at bedside. Wife on telephone. Disposition Plan: Discharge possibly tomorrow if no longer symptomatic with orthostatic vital signs, in addition to ongoing neurologic workup   Consultants:  Cardiology Urology  Procedures:  Transthoracic Echocardiogram  Antimicrobials: None    Subjective: Hematuria has improved today. Patient reports intermittent lightheadedness mostly as he lies down but also with standing.  Objective: BP 130/63 (BP Location: Left Arm)   Pulse 66   Temp 98 F (36.7 C)   Resp 18   Ht 6\' 3"  (1.905 m)   Wt 95 kg   SpO2 99%   BMI 26.18 kg/m   Examination:  General exam: Appears calm and comfortable Respiratory system: Clear to auscultation. Respiratory effort normal. Cardiovascular system: S1 & S2 heard, RRR. No murmurs, rubs, gallops or clicks. Gastrointestinal system: Abdomen is nondistended, soft and nontender. Normal bowel sounds heard. Central nervous system: Alert and oriented. No focal neurological deficits. Musculoskeletal: No edema. No calf tenderness Skin: No cyanosis. No rashes Psychiatry: Judgement and insight appear normal. Mood & affect appropriate.    Data Reviewed: I have personally reviewed following labs and imaging studies  CBC Lab Results  Component Value Date   WBC 6.5 02/24/2023   RBC 2.93 (L) 02/24/2023   HGB 8.3 (L) 02/24/2023   HCT 26.1 (L) 02/24/2023   MCV 89.1 02/24/2023   MCH 28.3 02/24/2023   PLT 229 02/24/2023   MCHC 31.8 02/24/2023   RDW 14.8 02/24/2023   LYMPHSABS 1.2 02/22/2023   MONOABS 0.6 02/22/2023   EOSABS 0.0 02/22/2023  BASOSABS 0.0 02/22/2023     Last metabolic panel Lab Results  Component  Value Date   NA 138 02/23/2023   K 3.9 02/23/2023   CL 105 02/23/2023   CO2 22 02/23/2023   BUN 27 (H) 02/23/2023   CREATININE 1.70 (H) 02/23/2023   GLUCOSE 142 (H) 02/23/2023   GFRNONAA 41 (L) 02/23/2023   GFRAA >60 07/11/2019   CALCIUM 9.7 02/23/2023   PHOS 4.7 (H) 12/02/2022   PROT 7.3 02/22/2023   ALBUMIN 3.5 02/22/2023   BILITOT 0.7 02/22/2023   ALKPHOS 70 02/22/2023   AST 21 02/22/2023   ALT 13 02/22/2023   ANIONGAP 11 02/23/2023    GFR: Estimated Creatinine Clearance: 42.1 mL/min (A) (by C-G formula based on SCr of 1.7 mg/dL (H)).  No results found for this or any previous visit (from the past 240 hours).    Radiology Studies: ECHOCARDIOGRAM COMPLETE Result Date: 02/23/2023    ECHOCARDIOGRAM REPORT   Patient Name:   Ricky MARDIS Sr. Date of Exam: 02/23/2023 Medical Rec #:  [id number]             Height:       75.0 in Accession #:    [id number]            Weight:       209.4 lb Date of Birth:  09-20-1945             BSA:          2.238 m Patient Age:    59 years              BP:           183/90 mmHg Patient Gender: M                     HR:           57 bpm. Exam Location:  Inpatient Procedure: 2D Echo, 3D Echo, Cardiac Doppler and Color Doppler Indications:    Syncope R55  History:        Patient has prior history of Echocardiogram examinations, most                 recent 11/15/2022. CHF and Cardiomyopathy, Previous Myocardial                 Infarction and CAD, Pulmonary HTN and CKD, stage 3,                 Signs/Symptoms:Syncope; Risk Factors:Hypertension and                 Dyslipidemia.  Sonographer:    Lucendia Herrlich RCS Referring Phys: [id number] ROBERT DORRELL IMPRESSIONS  1. Left ventricular ejection fraction, by estimation, is 55 to 60%. The left ventricle has normal function. The left ventricle has no regional wall motion abnormalities. Left ventricular diastolic parameters are consistent with Grade I diastolic dysfunction (impaired relaxation).  2. Right  ventricular systolic function is normal. The right ventricular size is normal.  3. The mitral valve is normal in structure. Trivial mitral valve regurgitation. No evidence of mitral stenosis.  4. The aortic valve is normal in structure. Aortic valve regurgitation is trivial. No aortic stenosis is present.  5. The inferior vena cava is normal in size with greater than 50% respiratory variability, suggesting right atrial pressure of 3 mmHg. FINDINGS  Left Ventricle: Left ventricular ejection fraction, by estimation, is 55 to 60%. The left ventricle has normal function. The left ventricle  has no regional wall motion abnormalities. The left ventricular internal cavity size was normal in size. There is  no left ventricular hypertrophy. Left ventricular diastolic parameters are consistent with Grade I diastolic dysfunction (impaired relaxation). Right Ventricle: The right ventricular size is normal. No increase in right ventricular wall thickness. Right ventricular systolic function is normal. Left Atrium: Left atrial size was normal in size. Right Atrium: Right atrial size was normal in size. Pericardium: There is no evidence of pericardial effusion. Mitral Valve: The mitral valve is normal in structure. Trivial mitral valve regurgitation. No evidence of mitral valve stenosis. Tricuspid Valve: The tricuspid valve is normal in structure. Tricuspid valve regurgitation is trivial. No evidence of tricuspid stenosis. Aortic Valve: The aortic valve is normal in structure. Aortic valve regurgitation is trivial. No aortic stenosis is present. Aortic valve peak gradient measures 6.2 mmHg. Pulmonic Valve: The pulmonic valve was normal in structure. Pulmonic valve regurgitation is not visualized. No evidence of pulmonic stenosis. Aorta: The aortic root is normal in size and structure. Venous: The inferior vena cava is normal in size with greater than 50% respiratory variability, suggesting right atrial pressure of 3 mmHg.  IAS/Shunts: No atrial level shunt detected by color flow Doppler.  LEFT VENTRICLE PLAX 2D LVIDd:         5.80 cm   Diastology LVIDs:         3.70 cm   LV e' medial:    6.53 cm/s LV PW:         1.10 cm   LV E/e' medial:  11.9 LV IVS:        0.80 cm   LV e' lateral:   9.64 cm/s LVOT diam:     2.10 cm   LV E/e' lateral: 8.1 LV SV:         82 LV SV Index:   37 LVOT Area:     3.46 cm                           3D Volume EF:                          3D EF:        61 %                          LV EDV:       215 ml                          LV ESV:       84 ml                          LV SV:        131 ml RIGHT VENTRICLE             IVC RV S prime:     17.70 cm/s  IVC diam: 1.90 cm TAPSE (M-mode): 2.6 cm LEFT ATRIUM             Index        RIGHT ATRIUM           Index LA diam:        4.10 cm 1.83 cm/m   RA Area:     17.55 cm LA Vol (A2C):   53.5 ml 23.91 ml/m  RA Volume:   46.95 ml  20.98 ml/m LA Vol (A4C):   76.7 ml 34.27 ml/m LA Biplane Vol: 71.0 ml 31.73 ml/m  AORTIC VALVE AV Area (Vmax): 3.03 cm AV Vmax:        124.00 cm/s AV Peak Grad:   6.2 mmHg LVOT Vmax:      108.33 cm/s LVOT Vmean:     71.867 cm/s LVOT VTI:       0.238 m  AORTA Ao Root diam: 3.40 cm Ao Asc diam:  3.70 cm MITRAL VALVE                TRICUSPID VALVE MV Area (PHT): 2.76 cm     TR Peak grad:   27.5 mmHg MV Decel Time: 275 msec     TR Vmax:        262.00 cm/s MV E velocity: 77.90 cm/s MV A velocity: 107.00 cm/s  SHUNTS MV E/A ratio:  0.73         Systemic VTI:  0.24 m                             Systemic Diam: 2.10 cm Arvilla Meres MD Electronically signed by Arvilla Meres MD Signature Date/Time: 02/23/2023/12:31:31 PM    Final       LOS: 0 days    Jacquelin Hawking, MD [facility] 02/24/2023, 12:57 PM   If 7PM-7AM, please contact night-coverage [email or web address]

## 2023-02-24 NOTE — Progress Notes (Signed)
EEG complete - results pending.  LG/GMD

## 2023-02-24 NOTE — Hospital Course (Signed)
Ricky Fiss. is a 77 y.o. male with a history of diabetes, GERD, primary pretension, prostate cancer, CAD status post stent placement in September 2024, recurrent syncope.  Patient presented secondary to syncopal event.  Workup so far has been negative for etiology.  Concern for possible neurologic etiology with family concern for possible seizures.

## 2023-02-24 NOTE — Plan of Care (Signed)

## 2023-02-24 NOTE — Progress Notes (Signed)
Rounding Note    Patient Name: Ricky TIVNAN Sr. Date of Encounter: 02/24/2023  [facility] Cardiologist: Charlton Haws, MD    Subjective    77 yo with hx of "spells" for years  Had an implantable loop recorder which showed no significant arrhythmias   An event monitor from Sept. 2024 reports several episdes of SVT In review of the monitor, these episodes of SVT are nonsustained and are fairly slow .  The longest episode lasted 18 seconds with an average HR of 98 ( this is not fast enough to cause him to have presyncope )   Hb continues to fall and is 8.1 this morning    Inpatient Medications    Scheduled Meds:  allopurinol  300 mg Oral Daily   clopidogrel  75 mg Oral Daily   enzalutamide  160 mg Oral Daily   losartan  50 mg Oral Daily   melatonin  5 mg Oral QHS   pantoprazole  40 mg Oral BID AC   relugolix  120 mg Oral Daily   Continuous Infusions:  PRN Meds: acetaminophen **OR** acetaminophen, hydrALAZINE, ondansetron **OR** ondansetron (ZOFRAN) IV   Vital Signs    Vitals:   02/23/23 1932 02/23/23 2339 02/24/23 0509 02/24/23 0746  BP: (!) 155/69 (!) 141/75 113/63 130/63  Pulse: 66 (!) 56 (!) 58 66  Resp: 18 18 18 18   Temp: 98.9 F (37.2 C) 98 F (36.7 C) 98 F (36.7 C) 98 F (36.7 C)  TempSrc: Oral Oral Oral   SpO2: 100% 98% 90% 99%  Weight:      Height:        Intake/Output Summary (Last 24 hours) at 02/24/2023 0933 Last data filed at 02/23/2023 0945 Gross per 24 hour  Intake 200 ml  Output --  Net 200 ml      02/22/2023   11:22 AM 12/24/2022   10:59 AM 12/21/2022    7:25 AM  Last 3 Weights  Weight (lbs) 209 lb 7 oz 207 lb 12.8 oz 202 lb 2.6 oz  Weight (kg) 95 kg 94.257 kg 91.7 kg      Telemetry    NSR , PAC  - Personally Reviewed  ECG     - Personally Reviewed  Physical Exam   GEN: No acute distress.   Neck: No JVD Cardiac: RRR, no murmurs, rubs, or gallops.  Respiratory: Clear to auscultation bilaterally. GI:  Soft, nontender, non-distended  MS: No edema; No deformity. Neuro:  Nonfocal  Psych: Normal affect   Labs    High Sensitivity Troponin:   Recent Labs  Lab 02/22/23 1116  TROPONINIHS 15     Chemistry Recent Labs  Lab 02/22/23 1116 02/22/23 1129 02/23/23 0821  NA 136 137 138  K 4.5 4.5 3.9  CL 105 107 105  CO2 20*  --  22  GLUCOSE 146* 143* 142*  BUN 31* 31* 27*  CREATININE 2.09* 2.00* 1.70*  CALCIUM 10.0  --  9.7  PROT 7.3  --   --   ALBUMIN 3.5  --   --   AST 21  --   --   ALT 13  --   --   ALKPHOS 70  --   --   BILITOT 0.7  --   --   GFRNONAA 32*  --  41*  ANIONGAP 11  --  11    Lipids No results for input(s): "CHOL", "TRIG", "HDL", "LABVLDL", "LDLCALC", "CHOLHDL" in the last 168 hours.  Hematology Recent  Labs  Lab 02/22/23 1116 02/22/23 1129 02/23/23 1003 02/24/23 0618  WBC 6.8  --  7.6 6.5  RBC 2.96*  --  2.97* 2.93*  HGB 8.4* 8.5* 8.4* 8.3*  HCT 26.9* 25.0* 26.3* 26.1*  MCV 90.9  --  88.6 89.1  MCH 28.4  --  28.3 28.3  MCHC 31.2  --  31.9 31.8  RDW 14.9  --  14.7 14.8  PLT 235  --  233 229   Thyroid No results for input(s): "TSH", "FREET4" in the last 168 hours.  BNP Recent Labs  Lab 02/22/23 1116  BNP 220.5*    DDimer No results for input(s): "DDIMER" in the last 168 hours.   Radiology    ECHOCARDIOGRAM COMPLETE Result Date: 02/23/2023    ECHOCARDIOGRAM REPORT   Patient Name:   Ricky MIZER Sr. Date of Exam: 02/23/2023 Medical Rec #:  [id number]             Height:       75.0 in Accession #:    [id number]            Weight:       209.4 lb Date of Birth:  1946/01/15             BSA:          2.238 m Patient Age:    3 years              BP:           183/90 mmHg Patient Gender: M                     HR:           57 bpm. Exam Location:  Inpatient Procedure: 2D Echo, 3D Echo, Cardiac Doppler and Color Doppler Indications:    Syncope R55  History:        Patient has prior history of Echocardiogram examinations, most                 recent  11/15/2022. CHF and Cardiomyopathy, Previous Myocardial                 Infarction and CAD, Pulmonary HTN and CKD, stage 3,                 Signs/Symptoms:Syncope; Risk Factors:Hypertension and                 Dyslipidemia.  Sonographer:    Lucendia Herrlich RCS Referring Phys: [id number] ROBERT DORRELL IMPRESSIONS  1. Left ventricular ejection fraction, by estimation, is 55 to 60%. The left ventricle has normal function. The left ventricle has no regional wall motion abnormalities. Left ventricular diastolic parameters are consistent with Grade I diastolic dysfunction (impaired relaxation).  2. Right ventricular systolic function is normal. The right ventricular size is normal.  3. The mitral valve is normal in structure. Trivial mitral valve regurgitation. No evidence of mitral stenosis.  4. The aortic valve is normal in structure. Aortic valve regurgitation is trivial. No aortic stenosis is present.  5. The inferior vena cava is normal in size with greater than 50% respiratory variability, suggesting right atrial pressure of 3 mmHg. FINDINGS  Left Ventricle: Left ventricular ejection fraction, by estimation, is 55 to 60%. The left ventricle has normal function. The left ventricle has no regional wall motion abnormalities. The left ventricular internal cavity size was normal in size. There is  no left ventricular hypertrophy. Left ventricular diastolic parameters are  consistent with Grade I diastolic dysfunction (impaired relaxation). Right Ventricle: The right ventricular size is normal. No increase in right ventricular wall thickness. Right ventricular systolic function is normal. Left Atrium: Left atrial size was normal in size. Right Atrium: Right atrial size was normal in size. Pericardium: There is no evidence of pericardial effusion. Mitral Valve: The mitral valve is normal in structure. Trivial mitral valve regurgitation. No evidence of mitral valve stenosis. Tricuspid Valve: The tricuspid valve is normal in  structure. Tricuspid valve regurgitation is trivial. No evidence of tricuspid stenosis. Aortic Valve: The aortic valve is normal in structure. Aortic valve regurgitation is trivial. No aortic stenosis is present. Aortic valve peak gradient measures 6.2 mmHg. Pulmonic Valve: The pulmonic valve was normal in structure. Pulmonic valve regurgitation is not visualized. No evidence of pulmonic stenosis. Aorta: The aortic root is normal in size and structure. Venous: The inferior vena cava is normal in size with greater than 50% respiratory variability, suggesting right atrial pressure of 3 mmHg. IAS/Shunts: No atrial level shunt detected by color flow Doppler.  LEFT VENTRICLE PLAX 2D LVIDd:         5.80 cm   Diastology LVIDs:         3.70 cm   LV e' medial:    6.53 cm/s LV PW:         1.10 cm   LV E/e' medial:  11.9 LV IVS:        0.80 cm   LV e' lateral:   9.64 cm/s LVOT diam:     2.10 cm   LV E/e' lateral: 8.1 LV SV:         82 LV SV Index:   37 LVOT Area:     3.46 cm                           3D Volume EF:                          3D EF:        61 %                          LV EDV:       215 ml                          LV ESV:       84 ml                          LV SV:        131 ml RIGHT VENTRICLE             IVC RV S prime:     17.70 cm/s  IVC diam: 1.90 cm TAPSE (M-mode): 2.6 cm LEFT ATRIUM             Index        RIGHT ATRIUM           Index LA diam:        4.10 cm 1.83 cm/m   RA Area:     17.55 cm LA Vol (A2C):   53.5 ml 23.91 ml/m  RA Volume:   46.95 ml  20.98 ml/m LA Vol (A4C):   76.7 ml 34.27 ml/m LA Biplane Vol: 71.0 ml 31.73 ml/m  AORTIC VALVE  AV Area (Vmax): 3.03 cm AV Vmax:        124.00 cm/s AV Peak Grad:   6.2 mmHg LVOT Vmax:      108.33 cm/s LVOT Vmean:     71.867 cm/s LVOT VTI:       0.238 m  AORTA Ao Root diam: 3.40 cm Ao Asc diam:  3.70 cm MITRAL VALVE                TRICUSPID VALVE MV Area (PHT): 2.76 cm     TR Peak grad:   27.5 mmHg MV Decel Time: 275 msec     TR Vmax:        262.00 cm/s  MV E velocity: 77.90 cm/s MV A velocity: 107.00 cm/s  SHUNTS MV E/A ratio:  0.73         Systemic VTI:  0.24 m                             Systemic Diam: 2.10 cm Arvilla Meres MD Electronically signed by Arvilla Meres MD Signature Date/Time: 02/23/2023/12:31:31 PM    Final    CT HEAD WO CONTRAST ( ) Result Date: 02/22/2023 CLINICAL DATA:  Head trauma, minor (Age >= 65y); Neck trauma (Age >= 65y) EXAM: CT HEAD WITHOUT CONTRAST CT CERVICAL SPINE WITHOUT CONTRAST TECHNIQUE: Multidetector CT imaging of the head and cervical spine was performed following the standard protocol without intravenous contrast. Multiplanar CT image reconstructions of the cervical spine were also generated. RADIATION DOSE REDUCTION: This exam was performed according to the departmental dose-optimization program which includes automated exposure control, adjustment of the mA and/or kV according to patient size and/or use of iterative reconstruction technique. COMPARISON:  CT head and CT cervical spine 12/01/2022. FINDINGS: CT HEAD FINDINGS Brain: No evidence of acute infarction, hemorrhage, hydrocephalus, extra-axial collection or mass lesion/mass effect. 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 Vascular: Calcific atherosclerosis. No hyperdense vessel identified. Skull: No acute fracture. Sinuses/Orbits: Clear sinuses.  No acute orbital findings. Other: No mastoid effusions. CT CERVICAL SPINE FINDINGS Alignment: Straightening.  No substantial sagittal subluxation. Skull base and vertebrae: No acute fracture. Vertebral body heights are maintained. Soft tissues and spinal canal: No prevertebral fluid or swelling. No visible canal hematoma. Disc levels:  Mild for age multilevel bony degenerative change. Upper chest: Visualized lung apices are clear. Other: Atherosclerosis. IMPRESSION: No evidence of acute abnormality intracranially or in the cervical spine. Electronically Signed   By: Feliberto Harts M.D.   On: 02/22/2023  12:10   CT Cervical Spine Wo Contrast Result Date: 02/22/2023 CLINICAL DATA:  Head trauma, minor (Age >= 65y); Neck trauma (Age >= 65y) EXAM: CT HEAD WITHOUT CONTRAST CT CERVICAL SPINE WITHOUT CONTRAST TECHNIQUE: Multidetector CT imaging of the head and cervical spine was performed following the standard protocol without intravenous contrast. Multiplanar CT image reconstructions of the cervical spine were also generated. RADIATION DOSE REDUCTION: This exam was performed according to the departmental dose-optimization program which includes automated exposure control, adjustment of the mA and/or kV according to patient size and/or use of iterative reconstruction technique. COMPARISON:  CT head and CT cervical spine 12/01/2022. FINDINGS: CT HEAD FINDINGS Brain: No evidence of acute infarction, hemorrhage, hydrocephalus, extra-axial collection or mass lesion/mass effect. 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 Vascular: Calcific atherosclerosis. No hyperdense vessel identified. Skull: No acute fracture. Sinuses/Orbits: Clear sinuses.  No acute orbital findings. Other: No mastoid effusions. CT CERVICAL SPINE FINDINGS Alignment: Straightening.  No substantial sagittal subluxation. Skull base and vertebrae: No acute fracture. Vertebral body heights are maintained. Soft tissues and spinal canal: No prevertebral fluid or swelling. No visible canal hematoma. Disc levels:  Mild for age multilevel bony degenerative change. Upper chest: Visualized lung apices are clear. Other: Atherosclerosis. IMPRESSION: No evidence of acute abnormality intracranially or in the cervical spine. Electronically Signed   By: Feliberto Harts M.D.   On: 02/22/2023 12:10   DG Chest Portable 1 View Result Date: 02/22/2023 CLINICAL DATA:  Larey Seat EXAM: PORTABLE CHEST - 1 VIEW COMPARISON:  12/01/2022 FINDINGS: Lungs are clear.  No pneumothorax. Heart size and mediastinal contours are within normal limits. No effusion. Visualized  bones unremarkable. IMPRESSION: No acute cardiopulmonary disease. Electronically Signed   By: Corlis Leak M.D.   On: 02/22/2023 11:58    Cardiac Studies     Patient Profile     77 y.o. male   Assessment & Plan      Presyncope / syncope spells :   no cardiac etiology found so far.   His implantable loop recorder did not show any significant abnormality.  His event monitor showed some episodes of non-sustained ( and relatively slow) SVT .   The HR during the SVT was not fast enough to cause any hemodynamic changes ( HR range from 98 to 122)   I agree with neuro evaluation  He will need a follow up appt with cardiology in several weeks .  He will need a follow up CBC with his primary MD in several weeks .     [facility] will sign off.   Medication Recommendations:  continue current meds  Other recommendations (labs, testing, etc):   Follow up as an outpatient:  with cardiology in several weeks.    For questions or updates, please contact [facility] Please consult [email or web address] for contact info under        Signed, Kristeen Miss, MD  02/24/2023, 9:33 AM

## 2023-02-24 NOTE — Progress Notes (Signed)
Mobility Specialist Progress Note:   02/24/23 1530  Mobility  Activity Ambulated with assistance in hallway  Level of Assistance Standby assist, set-up cues, supervision of patient - no hands on  Assistive Device None  Distance Ambulated (ft) 150 ft  Activity Response Tolerated well  Mobility Referral Yes  Mobility visit 1 Mobility  Mobility Specialist Start Time (ACUTE ONLY) 1530  Mobility Specialist Stop Time (ACUTE ONLY) 1545  Mobility Specialist Time Calculation (min) (ACUTE ONLY) 15 min   Pt agreeable to mobility session. Required no physical assistance, only supervision for safety d/t falls/syncope. Pt denied dizziness/lightheadedness throughout ambulation. Left in transport chair for test/procedure.   Addison Lank Mobility Specialist Please contact via SecureChat or  Rehab office at [phone]

## 2023-02-24 NOTE — Consult Note (Cosign Needed Addendum)
Urology Consult Note   Requesting Attending Physician:  Narda Bonds, MD Service Providing Consult: Urology  Consulting Attending: Dr. Berneice Heinrich   Reason for Consult:  hematuria  HPI: Ricky Panther Sr. is seen in consultation for reasons noted above at the request of Narda Bonds, MD. PMH significant for T2DM, GERD, HFrEF-EF 40%, s/p stented LAD on Plavix, HTN, prostate cancer, s/p IMRT-followed by Dr. Mena Goes and presently maintained on Stuckey and Orgovyx, s/p IPP at [facility] on 10/29/2018, and CAD.  Initially patient experienced a syncopal event without prodrome at church, at which time he struck his head.  [facility] was consulted and complaint of ongoing hematuria, which we have seen him for in the past.  On my arrival patient was alert, oriented, in no distress.  He had no specific lower urinary tract symptoms and reported that he had not seen any hematuria in approximately 1-1/2 days.  He was accompanied by his daughter and son.  Reviewed the plan to proceed with flexible cystoscopy at some point on an outpatient basis after results of CT hematuria.  All questions were answered to their satisfaction  ------------------  Assessment:  77 y.o. male with reported hematuria   Recommendations: #Hematuria # Prostate cancer  Continue Xtandi and Orgovyx.  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  Patient is not experiencing any hematuria.  He may have a small amount of this, as he has in the past, for the duration of the time that he requires antiplatelet therapy +/- blood thinners.  There is no intervention indicated if he has not seen blood in the last 36 hours.  I would not intervene with mild hematuria regardless, as he is more likely to have trauma and worsening bleeding from instrumentation or catheter placement than  he has now.  Would  recommend more frequent CBCs in the short-term.  Patient may follow-up with our office for lab visit or his PCP.    Patient will follow-up with urology on discharge and [facility] will plan to make an appointment.  Renal function appears to be preserved with a baseline around 1.50, 1.70 today.  CT hematuria reveals IPP but he did not mention.  Otherwise there does not appear to be any clot accumulation in the bladder or concerning lesions.  I will follow along awaiting official radiology read.  Urology will not need to follow.  Please feel free to call with questions.  Case and plan discussed with Dr. Berneice Heinrich, Ricky Rowland  Past Medical History: Past Medical History:  Diagnosis Date   Acute kidney injury superimposed on chronic kidney disease (HCC) 11/19/2022   Borderline diabetes    DIET CONTROLLED   Bradycardia 11/17/2022   Dyslipidemia    Elevated PSA    External carotid artery stenosis    MILD-- BILATERAL   Fall 11/05/2017   GERD (gastroesophageal reflux disease)    Gout, arthritis    Hearing loss in left ear    Heart murmur    MILD --  ASYMPTOMATIC   History of gastric ulcer    REMOTE HX   Hypertension    Pre-diabetes    Prostate cancer (HCC) 12/21/2012   Gleason 3+4=7   Seizure-like activity (HCC)    Syncope and collapse    Wears glasses     Past Surgical History:  Past Surgical History:  Procedure Laterality Date   CORONARY STENT INTERVENTION N/A  11/16/2022   Procedure: CORONARY STENT INTERVENTION;  Surgeon: Elder Negus, MD;  Location: [facility];  Service: Cardiovascular;  Laterality: N/A;   CORONARY ULTRASOUND/IVUS N/A 11/16/2022   Procedure: Coronary Ultrasound/IVUS;  Surgeon: Elder Negus, MD;  Location: [facility]VAS[facility] CV [facility];  Service: Cardiovascular;  Laterality: N/A;   ENTEROSCOPY N/A 12/21/2022   Procedure: ENTEROSCOPY;  Surgeon: Vida Rigger, MD;  Location: [facility] ENDOSCOPY;  Service: Gastroenterology;  Laterality: N/A;    ESOPHAGOGASTRODUODENOSCOPY (EGD) WITH PROPOFOL N/A 12/02/2022   Procedure: ESOPHAGOGASTRODUODENOSCOPY (EGD) WITH PROPOFOL;  Surgeon: Charlott Rakes, MD;  Location: [facility] ENDOSCOPY;  Service: Gastroenterology;  Laterality: N/A;   ESOPHAGOGASTRODUODENOSCOPY (EGD) WITH PROPOFOL N/A 12/18/2022   Procedure: ESOPHAGOGASTRODUODENOSCOPY (EGD) WITH PROPOFOL;  Surgeon: Kerin Salen, MD;  Location: [facility] ENDOSCOPY;  Service: Gastroenterology;  Laterality: N/A;   GIVENS CAPSULE STUDY N/A 12/18/2022   Procedure: GIVENS CAPSULE STUDY;  Surgeon: Kerin Salen, MD;  Location: [facility] ENDOSCOPY;  Service: Gastroenterology;  Laterality: N/A;   HOT HEMOSTASIS N/A 12/21/2022   Procedure: HOT HEMOSTASIS (ARGON PLASMA COAGULATION/BICAP);  Surgeon: Vida Rigger, MD;  Location: [facility] ENDOSCOPY;  Service: Gastroenterology;  Laterality: N/A;  Straight Fire Probe   KNEE ARTHROSCOPY W/ MENISCAL REPAIR Left 03/10/2012   LEFT HEART CATH AND CORONARY ANGIOGRAPHY N/A 11/16/2022   Procedure: LEFT HEART CATH AND CORONARY ANGIOGRAPHY;  Surgeon: Elder Negus, MD;  Location: [facility];  Service: Cardiovascular;  Laterality: N/A;   LOOP RECORDER INSERTION N/A 01/05/2018   Procedure: LOOP RECORDER INSERTION;  Surgeon: Duke Salvia, MD;  Location: [facility] INVASIVE CV LAB;  Service: Cardiovascular;  Laterality: N/A;   LUMBAR SPINE SURGERY  03/10/1989   PROSTATE BIOPSY N/A 12/21/2012   Procedure: BIOPSY TRANSRECTAL ULTRASONIC PROSTATE (TUBP);  Surgeon: Lindaann Slough, MD;  Location: [facility];  Service: Urology;  Laterality: N/A;   PROSTATE BIOPSY  11/26/2011   benign   TEMPORARY PACEMAKER N/A 11/16/2022   Procedure: TEMPORARY PACEMAKER;  Surgeon: Elder Negus, MD;  Location: [facility];  Service: Cardiovascular;  Laterality: N/A;   TONSILLECTOMY     WISDOM TOOTH EXTRACTION      Medication: Current Facility-Administered Medications  Medication Dose Route Frequency Provider Last Rate Last Admin    acetaminophen (TYLENOL) tablet 650 mg  650 mg Oral Q6H PRN Alan Mulder, MD   650 mg at 02/24/23 3016   Or   acetaminophen (TYLENOL) suppository 650 mg  650 mg Rectal Q6H PRN Alan Mulder, MD       allopurinol (ZYLOPRIM) tablet 300 mg  300 mg Oral Daily Alan Mulder, MD   300 mg at 02/24/23 0109   clopidogrel (PLAVIX) tablet 75 mg  75 mg Oral Daily Narda Bonds, MD   75 mg at 02/24/23 3235   enzalutamide (XTANDI) capsule 160 mg  160 mg Oral Daily Narda Bonds, MD   160 mg at 02/24/23 1041   hydrALAZINE (APRESOLINE) tablet 25 mg  25 mg Oral Q6H PRN Narda Bonds, MD       losartan (COZAAR) tablet 50 mg  50 mg Oral Daily Narda Bonds, MD   50 mg at 02/24/23 0905   melatonin tablet 5 mg  5 mg Oral QHS Narda Bonds, MD   5 mg at 02/23/23 2103   ondansetron (ZOFRAN) tablet 4 mg  4 mg Oral Q6H PRN Alan Mulder, MD       Or   ondansetron (ZOFRAN) injection 4 mg  4 mg Intravenous Q6H PRN Dorrell,  Molly Maduro, MD       pantoprazole (PROTONIX) EC tablet 40 mg  40 mg Oral BID Donney Dice, MD   40 mg at 02/24/23 1610   relugolix (ORGOVYX) tablet 120 mg  120 mg Oral Daily Narda Bonds, MD   120 mg at 02/24/23 1041    Allergies: Allergies  Allergen Reactions   Ezetimibe Other (See Comments)    Muscle aches   Statins Other (See Comments)    Joint pain    Social History: Social History   Tobacco Use   Smoking status: Former    Current packs/day: 0.00    Average packs/day: 1 pack/day for 30.0 years (30.0 ttl pk-yrs)    Types: Cigarettes    Start date: 12/16/1967    Quit date: 12/15/1997    Years since quitting: 25.2   Smokeless tobacco: Never  Vaping Use   Vaping status: Never Used  Substance Use Topics   Alcohol use: Not Currently    Alcohol/week: 20.0 standard drinks of alcohol    Types: 20 Shots of liquor per week    Comment: OCCASIONAL, 12/28/17 stopped 11/05/17   Drug use: No    Family History Family History  Problem Relation Age of Onset   Heart  disease Mother    Heart disease Father    Cancer Sister        lung   Cancer Brother        prostate    Review of Systems  Genitourinary:  Negative for dysuria, flank pain, frequency, hematuria and urgency.     Objective   Vital signs in last 24 hours: BP 130/63 (BP Location: Left Arm)   Pulse 66   Temp 98 F (36.7 C)   Resp 18   Ht 6\' 3"  (1.905 m)   Wt 95 kg   SpO2 99%   BMI 26.18 kg/m   Physical Exam General: NAD, A&O, resting, appropriate HEENT: [place]/AT Pulmonary: Normal work of breathing Cardiovascular: RRR, no cyanosis Neuro: Appropriate, no focal neurological deficits  Most Recent Labs: Lab Results  Component Value Date   WBC 6.5 02/24/2023   HGB 8.3 (L) 02/24/2023   HCT 26.1 (L) 02/24/2023   PLT 229 02/24/2023    Lab Results  Component Value Date   NA 138 02/23/2023   K 3.9 02/23/2023   CL 105 02/23/2023   CO2 22 02/23/2023   BUN 27 (H) 02/23/2023   CREATININE 1.70 (H) 02/23/2023   CALCIUM 9.7 02/23/2023   MG 1.7 12/22/2022   PHOS 4.7 (H) 12/02/2022    Lab Results  Component Value Date   INR 0.9 02/22/2023   APTT 30 11/14/2022     Urine Culture: @[id number] (laburin,org,[id number],[id number])@   IMAGING: ECHOCARDIOGRAM COMPLETE Result Date: 02/23/2023    ECHOCARDIOGRAM REPORT   Patient Name:   Ricky VOLKOV Sr. Date of Exam: 02/23/2023 Medical Rec #:  [id number]             Height:       75.0 in Accession #:    [id number]            Weight:       209.4 lb Date of Birth:  1945-11-12             BSA:          2.238 m Patient Age:    77 years              BP:  183/90 mmHg Patient Gender: M                     HR:           57 bpm. Exam Location:  Inpatient Procedure: 2D Echo, 3D Echo, Cardiac Doppler and Color Doppler Indications:    Syncope R55  History:        Patient has prior history of Echocardiogram examinations, most                 recent 11/15/2022. CHF and Cardiomyopathy, Previous Myocardial                 Infarction and CAD, Pulmonary  HTN and CKD, stage 3,                 Signs/Symptoms:Syncope; Risk Factors:Hypertension and                 Dyslipidemia.  Sonographer:    Lucendia Herrlich RCS Referring Phys: [id number] ROBERT DORRELL IMPRESSIONS  1. Left ventricular ejection fraction, by estimation, is 55 to 60%. The left ventricle has normal function. The left ventricle has no regional wall motion abnormalities. Left ventricular diastolic parameters are consistent with Grade I diastolic dysfunction (impaired relaxation).  2. Right ventricular systolic function is normal. The right ventricular size is normal.  3. The mitral valve is normal in structure. Trivial mitral valve regurgitation. No evidence of mitral stenosis.  4. The aortic valve is normal in structure. Aortic valve regurgitation is trivial. No aortic stenosis is present.  5. The inferior vena cava is normal in size with greater than 50% respiratory variability, suggesting right atrial pressure of 3 mmHg. FINDINGS  Left Ventricle: Left ventricular ejection fraction, by estimation, is 55 to 60%. The left ventricle has normal function. The left ventricle has no regional wall motion abnormalities. The left ventricular internal cavity size was normal in size. There is  no left ventricular hypertrophy. Left ventricular diastolic parameters are consistent with Grade I diastolic dysfunction (impaired relaxation). Right Ventricle: The right ventricular size is normal. No increase in right ventricular wall thickness. Right ventricular systolic function is normal. Left Atrium: Left atrial size was normal in size. Right Atrium: Right atrial size was normal in size. Pericardium: There is no evidence of pericardial effusion. Mitral Valve: The mitral valve is normal in structure. Trivial mitral valve regurgitation. No evidence of mitral valve stenosis. Tricuspid Valve: The tricuspid valve is normal in structure. Tricuspid valve regurgitation is trivial. No evidence of tricuspid stenosis. Aortic Valve:  The aortic valve is normal in structure. Aortic valve regurgitation is trivial. No aortic stenosis is present. Aortic valve peak gradient measures 6.2 mmHg. Pulmonic Valve: The pulmonic valve was normal in structure. Pulmonic valve regurgitation is not visualized. No evidence of pulmonic stenosis. Aorta: The aortic root is normal in size and structure. Venous: The inferior vena cava is normal in size with greater than 50% respiratory variability, suggesting right atrial pressure of 3 mmHg. IAS/Shunts: No atrial level shunt detected by color flow Doppler.  LEFT VENTRICLE PLAX 2D LVIDd:         5.80 cm   Diastology LVIDs:         3.70 cm   LV e' medial:    6.53 cm/s LV PW:         1.10 cm   LV E/e' medial:  11.9 LV IVS:        0.80 cm   LV e' lateral:   9.64  cm/s LVOT diam:     2.10 cm   LV E/e' lateral: 8.1 LV SV:         82 LV SV Index:   37 LVOT Area:     3.46 cm                           3D Volume EF:                          3D EF:        61 %                          LV EDV:       215 ml                          LV ESV:       84 ml                          LV SV:        131 ml RIGHT VENTRICLE             IVC RV S prime:     17.70 cm/s  IVC diam: 1.90 cm TAPSE (M-mode): 2.6 cm LEFT ATRIUM             Index        RIGHT ATRIUM           Index LA diam:        4.10 cm 1.83 cm/m   RA Area:     17.55 cm LA Vol (A2C):   53.5 ml 23.91 ml/m  RA Volume:   46.95 ml  20.98 ml/m LA Vol (A4C):   76.7 ml 34.27 ml/m LA Biplane Vol: 71.0 ml 31.73 ml/m  AORTIC VALVE AV Area (Vmax): 3.03 cm AV Vmax:        124.00 cm/s AV Peak Grad:   6.2 mmHg LVOT Vmax:      108.33 cm/s LVOT Vmean:     71.867 cm/s LVOT VTI:       0.238 m  AORTA Ao Root diam: 3.40 cm Ao Asc diam:  3.70 cm MITRAL VALVE                TRICUSPID VALVE MV Area (PHT): 2.76 cm     TR Peak grad:   27.5 mmHg MV Decel Time: 275 msec     TR Vmax:        262.00 cm/s MV E velocity: 77.90 cm/s MV A velocity: 107.00 cm/s  SHUNTS MV E/A ratio:  0.73         Systemic VTI:   0.24 m                             Systemic Diam: 2.10 cm Arvilla Meres MD Electronically signed by Arvilla Meres MD Signature Date/Time: 02/23/2023/12:31:31 PM    Final     ------  Elmon Kirschner, NP Pager: [phone]   Please contact the urology consult pager with any further questions/concerns.

## 2023-02-24 NOTE — Procedures (Signed)
Patient Name: Ricky GAITHER Sr.  MRN: [id number]  Epilepsy Attending: Charlsie Quest  Referring Physician/Provider: Narda Bonds, MD  Date: 02/24/2023 Duration: 23.58 mins  Patient history: 77yo M with syncope getting eeg to evaluate for seizure  Level of alertness: Awake, drowsy  AEDs during EEG study: None  Technical aspects: This EEG study was done with scalp electrodes positioned according to the 10-20 International system of electrode placement. Electrical activity was reviewed with band pass filter of 1-70Hz , sensitivity of 7 uV/mm, display speed of 62mm/sec with a 60Hz  notched filter applied as appropriate. EEG data were recorded continuously and digitally stored.  Video monitoring was available and reviewed as appropriate.  Description: The posterior dominant rhythm consists of 8-9Hz  activity of moderate voltage (25-35 uV) seen predominantly in posterior head regions, symmetric and reactive to eye opening and eye closing. Drowsiness was characterized by attenuation of the posterior background rhythm. Hyperventilation and photic stimulation were not performed.     IMPRESSION: This study is within normal limits. No seizures or epileptiform discharges were seen throughout the recording.  A normal interictal EEG does not exclude the diagnosis of epilepsy.  Ricky Rowland

## 2023-02-24 NOTE — Progress Notes (Signed)
Carotid duplex has been completed.   Results can be found under chart review under CV PROC. 02/24/2023 4:25 PM Arrion Burruel RVT, RDMS

## 2023-02-25 DIAGNOSIS — Z8673 Personal history of transient ischemic attack (TIA), and cerebral infarction without residual deficits: Secondary | ICD-10-CM

## 2023-02-25 DIAGNOSIS — R55 Syncope and collapse: Secondary | ICD-10-CM | POA: Diagnosis not present

## 2023-02-25 LAB — CBC
HCT: 25.6 % — ABNORMAL LOW (ref 39.0–52.0)
Hemoglobin: 8.1 g/dL — ABNORMAL LOW (ref 13.0–17.0)
MCH: 28 pg (ref 26.0–34.0)
MCHC: 31.6 g/dL (ref 30.0–36.0)
MCV: 88.6 fL (ref 80.0–100.0)
Platelets: 232 10*3/uL (ref 150–400)
RBC: 2.89 MIL/uL — ABNORMAL LOW (ref 4.22–5.81)
RDW: 14.8 % (ref 11.5–15.5)
WBC: 6.7 10*3/uL (ref 4.0–10.5)
nRBC: 0 % (ref 0.0–0.2)

## 2023-02-25 LAB — HEMOGLOBIN AND HEMATOCRIT, BLOOD
HCT: 28.4 % — ABNORMAL LOW (ref 39.0–52.0)
Hemoglobin: 9 g/dL — ABNORMAL LOW (ref 13.0–17.0)

## 2023-02-25 LAB — PREPARE RBC (CROSSMATCH)

## 2023-02-25 MED ORDER — SODIUM CHLORIDE 0.9% IV SOLUTION
Freq: Once | INTRAVENOUS | Status: AC
Start: 2023-02-25 — End: 2023-02-25

## 2023-02-25 MED ORDER — POLYETHYLENE GLYCOL 3350 17 G PO PACK
17.0000 g | PACK | Freq: Every day | ORAL | Status: DC
  Administered 2023-02-25: 17 g via ORAL
  Filled 2023-02-25: qty 1

## 2023-02-25 NOTE — Discharge Summary (Signed)
Physician Discharge Summary   Patient: Ricky LANDESMAN Sr. MRN: [id number] DOB: 1945/07/21  Admit date:     02/22/2023  Discharge date: 02/25/23  Discharge Physician: Jacquelin Hawking, MD   PCP: [name], MD   Recommendations at discharge:  PCP visit for hospital follow-up Cardiology visit for hospital follow-up Urology visit for hematuria/hospital follow-up Serial CBC checks  Discharge Diagnoses: Principal Problem:   Syncope Active Problems:   Coronary artery disease involving native coronary artery of native heart without angina pectoris   Gross hematuria  Resolved Problems:   * No resolved hospital problems. *  Hospital Course: Ricky Rowland. is a 77 y.o. male with a history of diabetes, GERD, primary pretension, prostate cancer, CAD status post stent placement in September 2024, recurrent syncope.  Patient presented secondary to syncopal event.  Workup so far has been negative for etiology.  Concern for possible neurologic etiology with family concern for possible seizures.  Assessment and Plan:  Syncope Unclear etiology. Patient with a long history with prior cardiac workup. Most recently, patient with a Zio patch which captured episodes of SVT. Episode of syncope significant for no prodrome without seizure activity witnessed by bystanders. Syncope workup this admission significant for negative CT head. No hemodynamically significant arrhythmia noted on telemetry. Orthostatic vital signs normal. Transthoracic Echocardiogram with improved LVEF. Cardiology consulted with recommendations for no inpatient cardiac workup. After discussion with family, concern there may be a neurologic etiology as patient has had episode of staring and memory lapses with these episodes. MRI and EEG without etiology for syncope. Carotid ultrasound without severe disease. Patient discharged with recommendation for neurology follow-up.  Remote stroke Old right basal ganglia small vessel  infarct and mild generalized volume loss noted on MRI. Patient is on Plavix and Repatha. Referral placed to neurology for outpatient follow-up.   CAD Patient with recent PCI with stent placement in September 2024, three months prior to admission. Patient was recommended for DAPT for 12 months, but this was modified to Plavix monotherapy after a GI bleed episode in October 2024. Continue Plavix.   Gross hematuria Complicated by prostate cancer and Plavix use. Unclear if contributed to by Plavix. Hemoglobin of 9 three days prior to admission, now down to 8.3g/dL. Patient with ongoing gross hematuria per his report. CT hematuria scan without etiology. Patient to follow-up with urology on discharge.  Chronic anemia Patient with baseline hemoglobin from 8-9. Goal hemoglobin is >8 g/dL. Hemoglobin drift from 9 g/dL prior to admission down to hemoglobin of 8.1 g/dL prior to discharge. 1 unit of PRBC transfused prior to discharge. Patient will need serial CBCs for surveillance of anemia   Gout Continue allopurinol   History of prostate cancer Continue Xtandi and Orgovyx   Primary hypertension Initially uncontrolled but improved. Losartan was increased, but patient developed orthostatic hypotension. Discontinued losartan on discharge.   Orthostatic hypotension Patient is positive for orthostatic hypotension today. Possibly related to increase in losartan. Patient with symptoms of lightheadedness. Resolved with stopping losartan.  Consultants:  Cardiology Urology   Procedures:  Transthoracic Echocardiogram  Disposition: Home Diet recommendation: Cardiac diet   DISCHARGE MEDICATION: Allergies as of 02/25/2023       Reactions   Ezetimibe Other (See Comments)   Muscle aches   Statins Other (See Comments)   Joint pain        Medication List     STOP taking these medications    losartan 25 MG tablet Commonly known as: COZAAR  TAKE these medications    acetaminophen  650 MG CR tablet Commonly known as: TYLENOL Take 650 mg by mouth every 8 (eight) hours as needed for pain.   allopurinol 300 MG tablet Commonly known as: ZYLOPRIM Take 300 mg by mouth daily.   clopidogrel 75 MG tablet Commonly known as: PLAVIX Take 75 mg by mouth daily.   docusate sodium 100 MG capsule Commonly known as: COLACE Take 2 capsules (200 mg total) by mouth daily as needed for mild constipation.   ferrous sulfate 325 (65 FE) MG tablet Take 325 mg by mouth daily with breakfast.   MULTIVITAMIN ADULT EXTRA C PO Take 1 tablet by mouth daily.   nitroGLYCERIN 0.4 MG SL tablet Commonly known as: NITROSTAT Place 1 tablet (0.4 mg total) under the tongue every 5 (five) minutes as needed for chest pain (up to 3 doses).   Orgovyx 120 MG tablet Generic drug: relugolix Take 120 mg by mouth daily.   pantoprazole 40 MG tablet Commonly known as: Protonix Take 1 tablet (40 mg total) by mouth 2 (two) times daily before a meal.   polyethylene glycol powder 17 GM/SCOOP powder Commonly known as: GLYCOLAX/MIRALAX Take 1 capful (17 g) with water by mouth daily as needed for mild constipation.   Repatha SureClick 140 MG/ML Soaj Generic drug: Evolocumab Inject 140 mg into the skin every 14 (fourteen) days.   tolnaftate 1 % cream Commonly known as: TINACTIN Apply 1 application topically daily as needed (jock itch).   Xtandi 40 MG capsule Generic drug: enzalutamide Take 160 mg by mouth daily.        Follow-up Information     [name], MD. Schedule an appointment as soon as possible for a visit in 1 week(s).   Specialty: Internal Medicine Why: For hospital follow-up Contact information: [address] [place] [address] [phone]         Sharlene Dory, PA-C Follow up.   Specialty: Cardiology Why: Hospital follow-up with Cardiology scheduled for 03/23/2023 at 2:45pm. Please arrive 15 minutes early for check-in. If this date/ time does not  work for you, please call our office to reschedule. Contact information: [address] [place] [address] [phone]                Discharge Exam: BP (!) 142/76 (BP Location: Right Arm)   Pulse 61   Temp (!) 97.2 F (36.2 C) (Oral)   Resp 18   Ht 6\' 3"  (1.905 m)   Wt 95 kg   SpO2 97%   BMI 26.18 kg/m   General exam: Appears calm and comfortable Respiratory system: Clear to auscultation. Respiratory effort normal. Cardiovascular system: S1 & S2 heard, RRR. Gastrointestinal system: Abdomen is nondistended, soft and nontender. Normal bowel sounds heard. Central nervous system: Alert and oriented. No focal neurological deficits. Musculoskeletal: No edema. No calf tenderness Psychiatry: Judgement and insight appear normal. Mood & affect appropriate.   Condition at discharge: stable  The results of significant diagnostics from this hospitalization (including imaging, microbiology, ancillary and laboratory) are listed below for reference.   Imaging Studies: MR BRAIN WO CONTRAST Result Date: 02/24/2023 CLINICAL DATA:  Syncope EXAM: MRI HEAD WITHOUT CONTRAST TECHNIQUE: Multiplanar, multiecho pulse sequences of the brain and surrounding structures were obtained without intravenous contrast. COMPARISON:  None Available. FINDINGS: Brain: No acute infarct, mass effect or extra-axial collection. No acute or chronic hemorrhage. Old right basal ganglia small vessel infarct. Mild generalized volume loss. The midline  structures are normal. Vascular: Normal flow voids. Skull and upper cervical spine: Normal calvarium and skull base. Visualized upper cervical spine and soft tissues are normal. Sinuses/Orbits:No paranasal sinus fluid levels or advanced mucosal thickening. No mastoid or middle ear effusion. Normal orbits. IMPRESSION: 1. No acute intracranial abnormality. 2. Old right basal ganglia small vessel infarct and mild generalized volume loss. Electronically Signed   By:  Deatra Robinson M.D.   On: 02/24/2023 23:26   VAS US CAROTID Result Date: 02/24/2023 Carotid Arterial Duplex Study Patient Name:  GYAN POPPEN Sr.  Date of Exam:   02/24/2023 Medical Rec #: [id number]              Accession #:    [id number] Date of Birth: 1945-07-07              Patient Gender: M Patient Age:   37 years Exam Location:  [facility] Procedure:      VAS US CAROTID Referring Phys: Aizen Duval --------------------------------------------------------------------------------  Indications:       Syncope. Risk Factors:      Hypertension, hyperlipidemia, prior MI, coronary artery                    disease. Other Factors:     CKD. Limitations        Today's exam was limited due to poor tissue/ultrasound                    interface. Comparison Study:  Previous exam on 09/20/2020 showed a RT ICA 1-39% & LT ICA                    40-59% stenosis. Performing Technologist: Ernestene Mention RVT, RDMS  Examination Guidelines: A complete evaluation includes B-mode imaging, spectral Doppler, color Doppler, and power Doppler as needed of all accessible portions of each vessel. Bilateral testing is considered an integral part of a complete examination. Limited examinations for reoccurring indications may be performed as noted.  Right Carotid Findings: +----------+--------+--------+--------+------------------+------------------+           PSV cm/sEDV cm/sStenosisPlaque DescriptionComments           +----------+--------+--------+--------+------------------+------------------+ CCA Prox  80      10                                intimal thickening +----------+--------+--------+--------+------------------+------------------+ CCA Distal84      12                                intimal thickening +----------+--------+--------+--------+------------------+------------------+ ICA Prox  107     23      1-39%   calcific                              +----------+--------+--------+--------+------------------+------------------+ ICA Distal53      13                                                   +----------+--------+--------+--------+------------------+------------------+ ECA       116     12                                                   +----------+--------+--------+--------+------------------+------------------+ +----------+--------+-------+----------------+-------------------+  PSV cm/sEDV cmsDescribe        Arm Pressure (mmHG) +----------+--------+-------+----------------+-------------------+ [id number]            Multiphasic, WNL                    +----------+--------+-------+----------------+-------------------+ +---------+--------+--+--------+--+---------+ VertebralPSV cm/s49EDV cm/s11Antegrade +---------+--------+--+--------+--+---------+  Left Carotid Findings: +----------+--------+--------+--------+---------------------+------------------+           PSV cm/sEDV cm/sStenosisPlaque Description   Comments           +----------+--------+--------+--------+---------------------+------------------+ CCA Prox  116     14              calcific                                +----------+--------+--------+--------+---------------------+------------------+ CCA Distal116     17              heterogenous and                                                          calcific                                +----------+--------+--------+--------+---------------------+------------------+ ICA Prox  138     30      40-59%  calcific and                                                              heterogenous                            +----------+--------+--------+--------+---------------------+------------------+ ICA Mid   64      16                                                      +----------+--------+--------+--------+---------------------+------------------+ ICA  Distal47      15                                   not well                                                                  visualized         +----------+--------+--------+--------+---------------------+------------------+ ECA       107     0                                                       +----------+--------+--------+--------+---------------------+------------------+ +----------+--------+--------+----------------+-------------------+  PSV cm/sEDV cm/sDescribe        Arm Pressure (mmHG) +----------+--------+--------+----------------+-------------------+ [id number]             Multiphasic, WNL                    +----------+--------+--------+----------------+-------------------+ +---------+--------+--+--------+--+---------+ VertebralPSV cm/s51EDV cm/s16Antegrade +---------+--------+--+--------+--+---------+   Summary: Right Carotid: Velocities in the right ICA are consistent with a 1-39% stenosis. Left Carotid: Velocities in the left ICA are consistent with a 40-59% stenosis. Vertebrals:  Bilateral vertebral arteries demonstrate antegrade flow. Subclavians: Normal flow hemodynamics were seen in bilateral subclavian              arteries. *See table(s) above for measurements and observations.  Electronically signed by Delia Heady MD on 02/24/2023 at 5:13:14 PM.    Final    EEG adult Result Date: 02/24/2023 Charlsie Quest, MD     02/24/2023  3:46 PM Patient Name: [name] MRN: [id number] Epilepsy Attending: Charlsie Quest Referring Physician/Provider: Narda Bonds, MD Date: 02/24/2023 Duration: 23.58 mins Patient history: 77yo M with syncope getting eeg to evaluate for seizure Level of alertness: Awake, drowsy AEDs during EEG study: None Technical aspects: This EEG study was done with scalp electrodes positioned according to the 10-20 International system of electrode placement. Electrical activity was reviewed with band pass filter of  1-70Hz , sensitivity of 7 uV/mm, display speed of 97mm/sec with a 60Hz  notched filter applied as appropriate. EEG data were recorded continuously and digitally stored.  Video monitoring was available and reviewed as appropriate. Description: The posterior dominant rhythm consists of 8-9Hz  activity of moderate voltage (25-35 uV) seen predominantly in posterior head regions, symmetric and reactive to eye opening and eye closing. Drowsiness was characterized by attenuation of the posterior background rhythm. Hyperventilation and photic stimulation were not performed.   IMPRESSION: This study is within normal limits. No seizures or epileptiform discharges were seen throughout the recording. A normal interictal EEG does not exclude the diagnosis of epilepsy. Charlsie Quest   CT HEMATURIA WORKUP Result Date: 02/24/2023 CLINICAL DATA:  Gross hematuria. EXAM: CT ABDOMEN AND PELVIS WITHOUT AND WITH CONTRAST TECHNIQUE: Multidetector CT imaging of the abdomen and pelvis was performed following the standard protocol before and following the bolus administration of intravenous contrast. RADIATION DOSE REDUCTION: This exam was performed according to the departmental dose-optimization program which includes automated exposure control, adjustment of the mA and/or kV according to patient size and/or use of iterative reconstruction technique. CONTRAST:  OMNIPAQUE IOHEXOL 350 MG/ML SOLN COMPARISON:  12/16/2022 FINDINGS: Lower chest: No acute findings. Hepatobiliary: No suspicious focal abnormality within the liver parenchyma. Tiny calcified gallstones evident. No intrahepatic or extrahepatic biliary dilation. Pancreas: No focal mass lesion. No dilatation of the main duct. No intraparenchymal cyst. No peripancreatic edema. Spleen: No splenomegaly. No suspicious focal mass lesion. Adrenals/Urinary Tract: No adrenal nodule or mass. Pre contrast imaging shows no stones in either kidney or ureter. No bladder stones Imaging after  IV contrast administration shows no suspicious enhancing lesion in either kidney. There is some focal cortical scarring upper pole left kidney. Delayed post-contrast imaging shows no wall thickening or soft tissue filling defect in either intrarenal collecting system or renal pelvis. Left ureter is well opacified and unremarkable. Right ureter incompletely opacified but shows no wall thickening, focal dilatation, or evidence of soft tissue mass. Delayed imaging of the bladder shows no focal wall thickening or mass lesion. Stomach/Bowel: Stomach is moderately distended with food and fluid. Duodenum is  normally positioned as is the ligament of Treitz. No small bowel wall thickening. No small bowel dilatation. The terminal ileum is normal. The appendix is normal. No gross colonic mass. No colonic wall thickening. Diverticuli are seen scattered along the entire length of the colon without CT findings of diverticulitis. Vascular/Lymphatic: There is moderate atherosclerotic calcification of the abdominal aorta without aneurysm. There is no gastrohepatic or hepatoduodenal ligament lymphadenopathy. No retroperitoneal or mesenteric lymphadenopathy. No pelvic sidewall lymphadenopathy. Reproductive: Fiducial markers noted in the prostate gland. Penile prosthetic device evident. Other: No intraperitoneal free fluid. Musculoskeletal: No worrisome lytic or sclerotic osseous abnormality. IMPRESSION: 1. No findings to explain the patient's history of hematuria. 2. No acute findings in the abdomen or pelvis. 3. Cholelithiasis. 4. Colonic diverticulosis without diverticulitis. 5.  Aortic Atherosclerosis (ICD10-I70.0). Electronically Signed   By: Kennith Center M.D.   On: 02/24/2023 14:53   ECHOCARDIOGRAM COMPLETE Result Date: 02/23/2023    ECHOCARDIOGRAM REPORT   Patient Name:   JIRAIYA FORSHEE Sr. Date of Exam: 02/23/2023 Medical Rec #:  [id number]             Height:       75.0 in Accession #:    [id number]            Weight:        209.4 lb Date of Birth:  October 08, 1945             BSA:          2.238 m Patient Age:    6 years              BP:           183/90 mmHg Patient Gender: M                     HR:           57 bpm. Exam Location:  Inpatient Procedure: 2D Echo, 3D Echo, Cardiac Doppler and Color Doppler Indications:    Syncope R55  History:        Patient has prior history of Echocardiogram examinations, most                 recent 11/15/2022. CHF and Cardiomyopathy, Previous Myocardial                 Infarction and CAD, Pulmonary HTN and CKD, stage 3,                 Signs/Symptoms:Syncope; Risk Factors:Hypertension and                 Dyslipidemia.  Sonographer:    Lucendia Herrlich RCS Referring Phys: [id number] ROBERT DORRELL IMPRESSIONS  1. Left ventricular ejection fraction, by estimation, is 55 to 60%. The left ventricle has normal function. The left ventricle has no regional wall motion abnormalities. Left ventricular diastolic parameters are consistent with Grade I diastolic dysfunction (impaired relaxation).  2. Right ventricular systolic function is normal. The right ventricular size is normal.  3. The mitral valve is normal in structure. Trivial mitral valve regurgitation. No evidence of mitral stenosis.  4. The aortic valve is normal in structure. Aortic valve regurgitation is trivial. No aortic stenosis is present.  5. The inferior vena cava is normal in size with greater than 50% respiratory variability, suggesting right atrial pressure of 3 mmHg. FINDINGS  Left Ventricle: Left ventricular ejection fraction, by estimation, is 55 to 60%. The left ventricle has normal function. The left ventricle  has no regional wall motion abnormalities. The left ventricular internal cavity size was normal in size. There is  no left ventricular hypertrophy. Left ventricular diastolic parameters are consistent with Grade I diastolic dysfunction (impaired relaxation). Right Ventricle: The right ventricular size is normal. No increase in right  ventricular wall thickness. Right ventricular systolic function is normal. Left Atrium: Left atrial size was normal in size. Right Atrium: Right atrial size was normal in size. Pericardium: There is no evidence of pericardial effusion. Mitral Valve: The mitral valve is normal in structure. Trivial mitral valve regurgitation. No evidence of mitral valve stenosis. Tricuspid Valve: The tricuspid valve is normal in structure. Tricuspid valve regurgitation is trivial. No evidence of tricuspid stenosis. Aortic Valve: The aortic valve is normal in structure. Aortic valve regurgitation is trivial. No aortic stenosis is present. Aortic valve peak gradient measures 6.2 mmHg. Pulmonic Valve: The pulmonic valve was normal in structure. Pulmonic valve regurgitation is not visualized. No evidence of pulmonic stenosis. Aorta: The aortic root is normal in size and structure. Venous: The inferior vena cava is normal in size with greater than 50% respiratory variability, suggesting right atrial pressure of 3 mmHg. IAS/Shunts: No atrial level shunt detected by color flow Doppler.  LEFT VENTRICLE PLAX 2D LVIDd:         5.80 cm   Diastology LVIDs:         3.70 cm   LV e' medial:    6.53 cm/s LV PW:         1.10 cm   LV E/e' medial:  11.9 LV IVS:        0.80 cm   LV e' lateral:   9.64 cm/s LVOT diam:     2.10 cm   LV E/e' lateral: 8.1 LV SV:         82 LV SV Index:   37 LVOT Area:     3.46 cm                           3D Volume EF:                          3D EF:        61 %                          LV EDV:       215 ml                          LV ESV:       84 ml                          LV SV:        131 ml RIGHT VENTRICLE             IVC RV S prime:     17.70 cm/s  IVC diam: 1.90 cm TAPSE (M-mode): 2.6 cm LEFT ATRIUM             Index        RIGHT ATRIUM           Index LA diam:        4.10 cm 1.83 cm/m   RA Area:     17.55 cm LA Vol (A2C):   53.5 ml 23.91 ml/m  RA Volume:   46.95 ml  20.98 ml/m LA Vol (A4C):   76.7 ml 34.27  ml/m LA Biplane Vol: 71.0 ml 31.73 ml/m  AORTIC VALVE AV Area (Vmax): 3.03 cm AV Vmax:        124.00 cm/s AV Peak Grad:   6.2 mmHg LVOT Vmax:      108.33 cm/s LVOT Vmean:     71.867 cm/s LVOT VTI:       0.238 m  AORTA Ao Root diam: 3.40 cm Ao Asc diam:  3.70 cm MITRAL VALVE                TRICUSPID VALVE MV Area (PHT): 2.76 cm     TR Peak grad:   27.5 mmHg MV Decel Time: 275 msec     TR Vmax:        262.00 cm/s MV E velocity: 77.90 cm/s MV A velocity: 107.00 cm/s  SHUNTS MV E/A ratio:  0.73         Systemic VTI:  0.24 m                             Systemic Diam: 2.10 cm Arvilla Meres MD Electronically signed by Arvilla Meres MD Signature Date/Time: 02/23/2023/12:31:31 PM    Final    CT HEAD WO CONTRAST ( ) Result Date: 02/22/2023 CLINICAL DATA:  Head trauma, minor (Age >= 65y); Neck trauma (Age >= 65y) EXAM: CT HEAD WITHOUT CONTRAST CT CERVICAL SPINE WITHOUT CONTRAST TECHNIQUE: Multidetector CT imaging of the head and cervical spine was performed following the standard protocol without intravenous contrast. Multiplanar CT image reconstructions of the cervical spine were also generated. RADIATION DOSE REDUCTION: This exam was performed according to the departmental dose-optimization program which includes automated exposure control, adjustment of the mA and/or kV according to patient size and/or use of iterative reconstruction technique. COMPARISON:  CT head and CT cervical spine 12/01/2022. FINDINGS: CT HEAD FINDINGS Brain: No evidence of acute infarction, hemorrhage, hydrocephalus, extra-axial collection or mass lesion/mass effect. 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 Vascular: Calcific atherosclerosis. No hyperdense vessel identified. Skull: No acute fracture. Sinuses/Orbits: Clear sinuses.  No acute orbital findings. Other: No mastoid effusions. CT CERVICAL SPINE FINDINGS Alignment: Straightening.  No substantial sagittal subluxation. Skull base and vertebrae: No acute fracture.  Vertebral body heights are maintained. Soft tissues and spinal canal: No prevertebral fluid or swelling. No visible canal hematoma. Disc levels:  Mild for age multilevel bony degenerative change. Upper chest: Visualized lung apices are clear. Other: Atherosclerosis. IMPRESSION: No evidence of acute abnormality intracranially or in the cervical spine. Electronically Signed   By: Feliberto Harts M.D.   On: 02/22/2023 12:10   CT Cervical Spine Wo Contrast Result Date: 02/22/2023 CLINICAL DATA:  Head trauma, minor (Age >= 65y); Neck trauma (Age >= 65y) EXAM: CT HEAD WITHOUT CONTRAST CT CERVICAL SPINE WITHOUT CONTRAST TECHNIQUE: Multidetector CT imaging of the head and cervical spine was performed following the standard protocol without intravenous contrast. Multiplanar CT image reconstructions of the cervical spine were also generated. RADIATION DOSE REDUCTION: This exam was performed according to the departmental dose-optimization program which includes automated exposure control, adjustment of the mA and/or kV according to patient size and/or use of iterative reconstruction technique. COMPARISON:  CT head and CT cervical spine 12/01/2022. FINDINGS: CT HEAD FINDINGS Brain: No evidence of acute infarction, hemorrhage, hydrocephalus, extra-axial collection or mass lesion/mass effect. 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 Vascular:  Calcific atherosclerosis. No hyperdense vessel identified. Skull: No acute fracture. Sinuses/Orbits: Clear sinuses.  No acute orbital findings. Other: No mastoid effusions. CT CERVICAL SPINE FINDINGS Alignment: Straightening.  No substantial sagittal subluxation. Skull base and vertebrae: No acute fracture. Vertebral body heights are maintained. Soft tissues and spinal canal: No prevertebral fluid or swelling. No visible canal hematoma. Disc levels:  Mild for age multilevel bony degenerative change. Upper chest: Visualized lung apices are clear. Other: Atherosclerosis.  IMPRESSION: No evidence of acute abnormality intracranially or in the cervical spine. Electronically Signed   By: Feliberto Harts M.D.   On: 02/22/2023 12:10   DG Chest Portable 1 View Result Date: 02/22/2023 CLINICAL DATA:  Larey Seat EXAM: PORTABLE CHEST - 1 VIEW COMPARISON:  12/01/2022 FINDINGS: Lungs are clear.  No pneumothorax. Heart size and mediastinal contours are within normal limits. No effusion. Visualized bones unremarkable. IMPRESSION: No acute cardiopulmonary disease. Electronically Signed   By: Corlis Leak M.D.   On: 02/22/2023 11:58    Microbiology: Results for orders placed or performed during the hospital encounter of 12/01/22  Resp panel by RT-PCR (RSV, Flu A&B, Covid) Anterior Nasal Swab     Status: None   Collection Time: 12/01/22 11:30 AM   Specimen: Anterior Nasal Swab  Result Value Ref Range Status   SARS Coronavirus 2 by RT PCR NEGATIVE NEGATIVE Final   Influenza A by PCR NEGATIVE NEGATIVE Final   Influenza B by PCR NEGATIVE NEGATIVE Final    Comment: (NOTE) The Xpert Xpress SARS-CoV-2/FLU/RSV plus assay is intended as an aid in the diagnosis of influenza from Nasopharyngeal swab specimens and should not be used as a sole basis for treatment. Nasal washings and aspirates are unacceptable for Xpert Xpress SARS-CoV-2/FLU/RSV testing.  Fact Sheet for Patients: BloggerCourse.com  Fact Sheet for Healthcare Providers: SeriousBroker.it  This test is not yet approved or cleared by the [place] FDA and has been authorized for detection and/or diagnosis of SARS-CoV-2 by FDA under an Emergency Use Authorization (EUA). This EUA will remain in effect (meaning this test can be used) for the duration of the COVID-19 declaration under Section 564(b)(1) of the Act, 21 U.S.C. section 360bbb-3(b)(1), unless the authorization is terminated or revoked.     Resp Syncytial Virus by PCR NEGATIVE NEGATIVE Final    Comment:  (NOTE) Fact Sheet for Patients: BloggerCourse.com  Fact Sheet for Healthcare Providers: SeriousBroker.it  This test is not yet approved or cleared by the [place] FDA and has been authorized for detection and/or diagnosis of SARS-CoV-2 by FDA under an Emergency Use Authorization (EUA). This EUA will remain in effect (meaning this test can be used) for the duration of the COVID-19 declaration under Section 564(b)(1) of the Act, 21 U.S.C. section 360bbb-3(b)(1), unless the authorization is terminated or revoked.  Performed at [facility], [address] [place], [place] [address]     Labs: CBC: Recent Labs  Lab 02/22/23 1116 02/22/23 1129 02/23/23 1003 02/24/23 0618 02/25/23 0633  WBC 6.8  --  7.6 6.5 6.7  NEUTROABS 4.9  --   --   --   --   HGB 8.4* 8.5* 8.4* 8.3* 8.1*  HCT 26.9* 25.0* 26.3* 26.1* 25.6*  MCV 90.9  --  88.6 89.1 88.6  PLT 235  --  233 229 232   Basic Metabolic Panel: Recent Labs  Lab 02/22/23 1116 02/22/23 1129 02/23/23 0821  NA 136 137 138  K 4.5 4.5 3.9  CL 105 107 105  CO2 20*  --  22  GLUCOSE 146* 143* 142*  BUN 31* 31* 27*  CREATININE 2.09* 2.00* 1.70*  CALCIUM 10.0  --  9.7   Liver Function Tests: Recent Labs  Lab 02/22/23 1116  AST 21  ALT 13  ALKPHOS 70  BILITOT 0.7  PROT 7.3  ALBUMIN 3.5    Discharge time spent: 35 minutes.  Signed: Jacquelin Hawking, MD [facility] 02/25/2023

## 2023-02-25 NOTE — Progress Notes (Signed)
Mobility Specialist Progress Note:    02/25/23 0900  Mobility  Activity Ambulated independently in hallway  Level of Assistance Standby assist, set-up cues, supervision of patient - no hands on  Assistive Device None  Distance Ambulated (ft) 300 ft  Activity Response Tolerated well  Mobility Referral Yes  Mobility visit 1 Mobility  Mobility Specialist Start Time (ACUTE ONLY) 0856  Mobility Specialist Stop Time (ACUTE ONLY) 0902  Mobility Specialist Time Calculation (min) (ACUTE ONLY) 6 min   Pt received in chair, agreeable to mobility session. Ambulated in hallway with SBA, denies dizziness. Tolerated well, asx throughout. Returned pt to room, left with all need met.   Feliciana Rossetti Mobility Specialist Please contact via Special educational needs teacher or  Rehab office at [phone]

## 2023-02-25 NOTE — Plan of Care (Signed)

## 2023-02-25 NOTE — Plan of Care (Signed)
No identifiable cause of hematuria on CT. Continue plan to follow up outpt for flexible cysto with his regular Urologist.

## 2023-02-25 NOTE — Plan of Care (Signed)

## 2023-02-26 LAB — TYPE AND SCREEN
ABO/RH(D): A POS
Antibody Screen: NEGATIVE
Unit division: 0

## 2023-02-26 LAB — BPAM RBC
Blood Product Expiration Date: 202501062359
ISSUE DATE / TIME: 202412181241
Unit Type and Rh: 6200

## 2023-03-22 NOTE — Progress Notes (Signed)
 Cardiology Office Note:  .   Date:  03/23/2023  ID:  Ricky Rowland., DOB [birth year], MRN [id number] PCP: Rexanne Ingle, MD  [facility] Providers Cardiologist:  Maude Emmer, MD {    History of Present Illness: .   Ricky Rowland. is a 78 y.o. male with a past medical history of diabetes, GERD, primary hypertension, prostate cancer, CAD status post stent placement in September 2024, and recurrent syncope who is here for hospital follow-up.  Patient presented to the ED on 12/15 secondary to a syncopal event.  Workup has been overall negative.  Concern for possible neurologic etiology for family concerns for seizures.  Most recently, patient had Zio patch which captured episodes of SVT.  Episode of syncope significant for no prodrome without seizure activity witnessed by bystanders.  Syncope workup that admission was significant for negative CT scan.  No hemodynamically significant arrhythmia noted on telemetry.  Orthostatic vital signs normal.  Transthoracic echocardiogram with improved LVEF.  MRI and EEG without any etiology for syncope.  Carotid ultrasound without severe disease.  Patient was discharged with recommendations to follow-up with neurology.  Today, he presents with recent heart attack and PCI, prostate cancer, and episodes of passing out, presents for follow-up after a recent ER visit. The patient reports having three episodes of passing out over the past few years, with no warning signs before each episode. During these episodes, the patient has fallen and hit his head, requiring stitches or staples. The patient's most recent episode of passing out was about a month ago. He has been referred to a neurologist for further evaluation, with an appointment scheduled for next month.  The patient also reports fatigue and lack of strength since their heart attack. He experiences shortness of breath with minimal activity. The patient is currently on medication for prostate  cancer, which he believe is causing many of his symptoms, including constant fatigue and a feeling of catching something in his throat. The patient also reports a history of high cholesterol and is currently on Repatha for this condition. He acknowledges a diet high in fried foods and meat, and expresses a willingness to make dietary adjustments to improve their cholesterol levels.  The patient's spouse provides additional information about the patient's recent hospitalizations, including internal bleeding that took several weeks to diagnose and treat. The patient's hemoglobin levels have been low, and they have received blood transfusions. The patient's spouse believes the patient's hemoglobin levels have been improving since starting iron tablets.  Reports no chest pain, pressure, or tightness. No edema, orthopnea, PND. Reports no palpitations.   Discussed the use of AI scribe software for clinical note transcription with the patient, who gave verbal consent to proceed.  ROS: Pertinent ROS in HPI  Studies Reviewed: .       Echocardiogram 02/23/2023 IMPRESSIONS     1. Left ventricular ejection fraction, by estimation, is 55 to 60%. The  left ventricle has normal function. The left ventricle has no regional  wall motion abnormalities. Left ventricular diastolic parameters are  consistent with Grade I diastolic  dysfunction (impaired relaxation).   2. Right ventricular systolic function is normal. The right ventricular  size is normal.   3. The mitral valve is normal in structure. Trivial mitral valve  regurgitation. No evidence of mitral stenosis.   4. The aortic valve is normal in structure. Aortic valve regurgitation is  trivial. No aortic stenosis is present.   5. The inferior vena cava is normal in  size with greater than 50%  respiratory variability, suggesting right atrial pressure of 3 mmHg.   FINDINGS   Left Ventricle: Left ventricular ejection fraction, by estimation, is 55   to 60%. The left ventricle has normal function. The left ventricle has no  regional wall motion abnormalities. The left ventricular internal cavity  size was normal in size. There is   no left ventricular hypertrophy. Left ventricular diastolic parameters  are consistent with Grade I diastolic dysfunction (impaired relaxation).   Right Ventricle: The right ventricular size is normal. No increase in  right ventricular wall thickness. Right ventricular systolic function is  normal.   Left Atrium: Left atrial size was normal in size.   Right Atrium: Right atrial size was normal in size.   Pericardium: There is no evidence of pericardial effusion.   Mitral Valve: The mitral valve is normal in structure. Trivial mitral  valve regurgitation. No evidence of mitral valve stenosis.   Tricuspid Valve: The tricuspid valve is normal in structure. Tricuspid  valve regurgitation is trivial. No evidence of tricuspid stenosis.   Aortic Valve: The aortic valve is normal in structure. Aortic valve  regurgitation is trivial. No aortic stenosis is present. Aortic valve peak  gradient measures 6.2 mmHg.   Pulmonic Valve: The pulmonic valve was normal in structure. Pulmonic valve  regurgitation is not visualized. No evidence of pulmonic stenosis.   Aorta: The aortic root is normal in size and structure.   Venous: The inferior vena cava is normal in size with greater than 50%  respiratory variability, suggesting right atrial pressure of 3 mmHg.   IAS/Shunts: No atrial level shunt detected by color flow Doppler.  Physical Exam:   VS:  BP (!) 146/70   Pulse 70   Ht 6' 3 (1.905 m)   Wt 216 lb 9.6 oz (98.2 kg)   SpO2 98%   BMI 27.07 kg/m    Wt Readings from Last 3 Encounters:  03/23/23 216 lb 9.6 oz (98.2 kg)  02/22/23 209 lb 7 oz (95 kg)  12/24/22 207 lb 12.8 oz (94.3 kg)    GEN: Well nourished, well developed in no acute distress NECK: No JVD; No carotid bruits CARDIAC: RRR, no murmurs,  rubs, gallops RESPIRATORY:  Clear to auscultation without rales, wheezing or rhonchi  ABDOMEN: Soft, non-tender, non-distended EXTREMITIES:  No edema; No deformity   ASSESSMENT AND PLAN: .   Syncope Multiple episodes over the past few years, most recent one month ago. Extensive workup including CT, MRI, EEG, echocardiogram, carotid ultrasound, and Zio monitor. Zio monitor showed episodes of SVT. No clear etiology identified. Possible orthostatic hypotension suggested. Neurology consult pending. -Continue current management and await neurology consult on 04/14/2023.  Hyperlipidemia LDL slightly elevated despite Repatha therapy. 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 Discussed dietary modifications. -Continue Repatha. -tried zetia with SE -Consider dietary modifications to lower LDL.  Post Myocardial Infarction/CAD Patient had a heart attack in September. Reports feeling well since the event but has not regained full strength and experiences shortness of breath with minimal activity. -Order in-home physical therapy to aid in strength recovery.  Anemia History of internal bleeding leading to anemia. Hemoglobin improving with iron supplementation. -Check CBC today to monitor hemoglobin levels.  Remote stroke -Physical therapy to work with the patient to gain strength and mobility  Orthostatic hypotension -Reviewed recommendations for proper hydration of 64 ounces of water daily as well as lower extremity compression -Luckily, no further syncope since hospitalization  Dispo: He can follow-up in a few months with Dr. Delford  Signed, Orren LOISE Fabry, PA-C

## 2023-03-23 ENCOUNTER — Ambulatory Visit: Payer: Medicare Other | Attending: Physician Assistant | Admitting: Physician Assistant

## 2023-03-23 VITALS — BP 146/70 | HR 70 | Ht 75.0 in | Wt 216.6 lb

## 2023-03-23 DIAGNOSIS — I2510 Atherosclerotic heart disease of native coronary artery without angina pectoris: Secondary | ICD-10-CM

## 2023-03-23 DIAGNOSIS — R55 Syncope and collapse: Secondary | ICD-10-CM

## 2023-03-23 DIAGNOSIS — N1831 Chronic kidney disease, stage 3a: Secondary | ICD-10-CM | POA: Diagnosis not present

## 2023-03-23 DIAGNOSIS — Z9861 Coronary angioplasty status: Secondary | ICD-10-CM

## 2023-03-23 DIAGNOSIS — I5022 Chronic systolic (congestive) heart failure: Secondary | ICD-10-CM

## 2023-03-23 DIAGNOSIS — Z9889 Other specified postprocedural states: Secondary | ICD-10-CM | POA: Diagnosis not present

## 2023-03-23 NOTE — Patient Instructions (Addendum)
 Medication Instructions:  Your physician recommends that you continue on your current medications as directed. Please refer to the Current Medication list given to you today.  *If you need a refill on your cardiac medications before your next appointment, please call your pharmacy*  Lab Work: Your physician recommends that you have lab work today. CBC If you have labs (blood work) drawn today and your tests are completely normal, you will receive your results only by: MyChart Message (if you have MyChart) OR A paper copy in the mail If you have any lab test that is abnormal or we need to change your treatment, we will call you to review the results.  Testing/Procedures: None ordered today.  Follow-Up: At [facility], you and your health needs are our priority.  As part of our continuing mission to provide you with exceptional heart care, we have created designated Provider Care Teams.  These Care Teams include your primary Cardiologist (physician) and Advanced Practice Providers (APPs -  Physician Assistants and Nurse Practitioners) who all work together to provide you with the care you need, when you need it.  We recommend signing up for the patient portal called MyChart.  Sign up information is provided on this After Visit Summary.  MyChart is used to connect with patients for Virtual Visits (Telemedicine).  Patients are able to view lab/test results, encounter notes, upcoming appointments, etc.  Non-urgent messages can be sent to your provider as well.   To learn more about what you can do with MyChart, go to [email or web address].    Your next appointment:   3 month(s)  Provider:   Maude Emmer, MD     Other Instructions At [facility] Physical Therapy has been ordered. They will call you to set up.

## 2023-03-24 LAB — CBC
Hematocrit: 28.7 % — ABNORMAL LOW (ref 37.5–51.0)
Hemoglobin: 9.3 g/dL — ABNORMAL LOW (ref 13.0–17.7)
MCH: 28.4 pg (ref 26.6–33.0)
MCHC: 32.4 g/dL (ref 31.5–35.7)
MCV: 88 fL (ref 79–97)
Platelets: 200 10*3/uL (ref 150–450)
RBC: 3.27 x10E6/uL — ABNORMAL LOW (ref 4.14–5.80)
RDW: 16.3 % — ABNORMAL HIGH (ref 11.6–15.4)
WBC: 8.3 10*3/uL (ref 3.4–10.8)

## 2023-04-14 ENCOUNTER — Encounter: Payer: Self-pay | Admitting: Diagnostic Neuroimaging

## 2023-04-14 ENCOUNTER — Ambulatory Visit: Payer: Medicare Other | Admitting: Diagnostic Neuroimaging

## 2023-04-14 VITALS — Ht 75.0 in | Wt 218.0 lb

## 2023-04-14 DIAGNOSIS — M545 Low back pain, unspecified: Secondary | ICD-10-CM | POA: Diagnosis not present

## 2023-04-14 DIAGNOSIS — G8929 Other chronic pain: Secondary | ICD-10-CM

## 2023-04-14 DIAGNOSIS — D649 Anemia, unspecified: Secondary | ICD-10-CM

## 2023-04-14 DIAGNOSIS — R42 Dizziness and giddiness: Secondary | ICD-10-CM

## 2023-04-14 DIAGNOSIS — R29898 Other symptoms and signs involving the musculoskeletal system: Secondary | ICD-10-CM | POA: Diagnosis not present

## 2023-04-14 DIAGNOSIS — R55 Syncope and collapse: Secondary | ICD-10-CM | POA: Diagnosis not present

## 2023-04-14 NOTE — Progress Notes (Signed)
 [facility]  PATIENT: Ricky Rowland Sr. DOB: 20-Nov-1945  REFERRING CLINICIAN: Briana Elgin LABOR, MD  HISTORY FROM: patient and chart review  REASON FOR VISIT: new consult  HISTORICAL  CHIEF COMPLAINT:  Chief Complaint  Patient presents with   New Patient (Initial Visit)    Pt in 7 with wife Pt here for syncope episodes  Pt states some dizziness     HISTORY OF PRESENT ILLNESS:   UPDATE (04/14/23, VRP): Since last visit, was doing well, until 02/22/23. Fell off stool and then briefly passed out (10-15 seconds). No prodrome symptoms. Has noted some weakness in legs. BP has been labile (high and low).   UPDATE (09/17/20, VRP): Since last visit, patient has stopped drinking alcohol as of May 08, 2020.  On June 04, 2020 patient was at home when all of a sudden he was staring and unresponsive at the kitchen table.  This lasted for about 15 to 20 seconds.  This was noticed by wife.  No convulsions.  Patient did not blackout or collapse.  He was sort of  frozen for moment. When he woke up he was not aware of the event.   UPDATE (12/28/17, VRP): Since last visit, here for recurrent attacks of lightheadedness and syncope. 11/05/17, had unprovoked syncope. Had prodrome lightheaded feeling, then fell to ground. Woke up in a few seconds, and was able to call his wife to help. He went to ER and was admitted. Workup negative. Now has stopped etoh use (previously 2-4 drinks per day consistently).   UPDATE 11/13/15: Since last visit, doing well. Saw cardiology, who referred pt to vestibular PT, who found orthostatic hypotension (SBP 156 --> 99). Now BP better controlled / regulated, and patient not having any more events.   PRIOR HPI (09/13/15): 78 year old male with hypertension, hypercholesteremia, prostate cancer, here for evaluation of dizziness attacks. Symptoms started in 2014. Patient has 8 to 12 episodes per year. No specific triggering factors. Patient had one attack last week  and another one the week before. Previously last attack was in November 2016. Patient describes a sensation of intestine, stomach upset sensation, overheating sensation, severe sweats, lightheadedness and nausea. Sometimes patient vomits. Patient feels off balance during these attacks. No unilateral numbness, tingling, slurred speech, double vision or headaches with the attacks. Patient typically tries to make it home, lay down and go to sleep. If patient vomits or has a bowel movement after the tachycardia started, seems to relieve the sensation and symptom. If patient is laying flat after an attack, if he tries to sit up symptoms are exacerbated. Patient has never measured his blood pressure during these attacks. Patient has got to the emergency room for several of these attacks while in the emergency room hypotension has not been documented. Patient denies any palpitations, chest pain, shortness of breath. Episodes of occurred in standing or sitting position. Episodes of occurred [place]. No specific triggering factors. In the past patient has had MRI of the brain, cardiac testing, heart monitor, nuclear medicine stress test, which appeared unremarkable. These testing were done 2-3 years ago.   REVIEW OF SYSTEMS: Full 14 system review of systems performed and negative except as per HPI.    ALLERGIES: Allergies  Allergen Reactions   Ezetimibe Other (See Comments)    Muscle aches   Statins Other (See Comments)    Joint pain    HOME MEDICATIONS: Outpatient Medications Prior to Visit  Medication Sig Dispense Refill  acetaminophen  (TYLENOL ) 650 MG CR tablet Take 650 mg by mouth every 8 (eight) hours as needed for pain.     allopurinol  (ZYLOPRIM ) 300 MG tablet Take 300 mg by mouth daily.     clopidogrel  (PLAVIX ) 75 MG tablet Take 75 mg by mouth daily.     docusate sodium  (COLACE) 100 MG capsule Take 2 capsules (200 mg total) by mouth daily as needed for mild  constipation. 100 capsule 0   ferrous sulfate  325 (65 FE) MG tablet Take 325 mg by mouth daily with breakfast.     losartan  (COZAAR ) 25 MG tablet Take 25 mg by mouth daily.     Multiple Vitamins-Minerals (MULTIVITAMIN ADULT EXTRA C PO) Take 1 tablet by mouth daily.     nitroGLYCERIN  (NITROSTAT ) 0.4 MG SL tablet Place 1 tablet (0.4 mg total) under the tongue every 5 (five) minutes as needed for chest pain (up to 3 doses). 25 tablet 3   polyethylene glycol powder (GLYCOLAX /MIRALAX ) 17 GM/SCOOP powder Take 1 capful (17 g) with water by mouth daily as needed for mild constipation. 238 g 0   REPATHA SURECLICK 140 MG/ML SOAJ Inject 140 mg into the skin every 14 (fourteen) days.      tolnaftate  (TINACTIN) 1 % cream Apply 1 application topically daily as needed (jock itch).     ORGOVYX  120 MG tablet Take 120 mg by mouth daily.     pantoprazole  (PROTONIX ) 40 MG tablet Take 1 tablet (40 mg total) by mouth 2 (two) times daily before a meal. 180 tablet 0   XTANDI  40 MG capsule Take 160 mg by mouth daily.     No facility-administered medications prior to visit.    PAST MEDICAL HISTORY: Past Medical History:  Diagnosis Date   Acute kidney injury superimposed on chronic kidney disease (HCC) 11/19/2022   Borderline diabetes    DIET CONTROLLED   Bradycardia 11/17/2022   Dyslipidemia    Elevated PSA    External carotid artery stenosis    MILD-- BILATERAL   Fall 11/05/2017   GERD (gastroesophageal reflux disease)    Gout, arthritis    Hearing loss in left ear    Heart murmur    MILD --  ASYMPTOMATIC   History of gastric ulcer    REMOTE HX   Hypertension    Pre-diabetes    Prostate cancer (HCC) 12/21/2012   Gleason 3+4=7   Seizure-like activity (HCC)    Stroke (HCC)    Syncope and collapse    Wears glasses     PAST SURGICAL HISTORY: Past Surgical History:  Procedure Laterality Date   CORONARY STENT INTERVENTION N/A 11/16/2022   Procedure: CORONARY STENT INTERVENTION;  Surgeon: Elmira Newman PARAS, MD;  Location: [facility];  Service: Cardiovascular;  Laterality: N/A;   CORONARY ULTRASOUND/IVUS N/A 11/16/2022   Procedure: Coronary Ultrasound/IVUS;  Surgeon: Elmira Newman PARAS, MD;  Location: [facility];  Service: Cardiovascular;  Laterality: N/A;   ENTEROSCOPY N/A 12/21/2022   Procedure: ENTEROSCOPY;  Surgeon: Rosalie Kitchens, MD;  Location: [facility] ENDOSCOPY;  Service: Gastroenterology;  Laterality: N/A;   ESOPHAGOGASTRODUODENOSCOPY (EGD) WITH PROPOFOL  N/A 12/02/2022   Procedure: ESOPHAGOGASTRODUODENOSCOPY (EGD) WITH PROPOFOL ;  Surgeon: Dianna Specking, MD;  Location: [facility] ENDOSCOPY;  Service: Gastroenterology;  Laterality: N/A;   ESOPHAGOGASTRODUODENOSCOPY (EGD) WITH PROPOFOL  N/A 12/18/2022   Procedure: ESOPHAGOGASTRODUODENOSCOPY (EGD) WITH PROPOFOL ;  Surgeon: Saintclair Jasper, MD;  Location: [facility] ENDOSCOPY;  Service: Gastroenterology;  Laterality: N/A;   GIVENS CAPSULE STUDY N/A 12/18/2022   Procedure: GIVENS CAPSULE  STUDY;  Surgeon: Saintclair Jasper, MD;  Location: [facility] ENDOSCOPY;  Service: Gastroenterology;  Laterality: N/A;   HOT HEMOSTASIS N/A 12/21/2022   Procedure: HOT HEMOSTASIS (ARGON PLASMA COAGULATION/BICAP);  Surgeon: Rosalie Kitchens, MD;  Location: [facility] ENDOSCOPY;  Service: Gastroenterology;  Laterality: N/A;  Straight Fire Probe   KNEE ARTHROSCOPY W/ MENISCAL REPAIR Left 03/10/2012   LEFT HEART CATH AND CORONARY ANGIOGRAPHY N/A 11/16/2022   Procedure: LEFT HEART CATH AND CORONARY ANGIOGRAPHY;  Surgeon: Elmira Newman PARAS, MD;  Location: [facility];  Service: Cardiovascular;  Laterality: N/A;   LOOP RECORDER INSERTION N/A 01/05/2018   Procedure: LOOP RECORDER INSERTION;  Surgeon: Fernande Elspeth BROCKS, MD;  Location: [facility] INVASIVE CV LAB;  Service: Cardiovascular;  Laterality: N/A;   LUMBAR SPINE SURGERY  03/10/1989   PROSTATE BIOPSY N/A 12/21/2012   Procedure: BIOPSY TRANSRECTAL ULTRASONIC PROSTATE (TUBP);  Surgeon: Thomasine Oiler, MD;  Location: [facility];  Service: Urology;  Laterality: N/A;   PROSTATE BIOPSY  11/26/2011   benign   TEMPORARY PACEMAKER N/A 11/16/2022   Procedure: TEMPORARY PACEMAKER;  Surgeon: Elmira Newman PARAS, MD;  Location: [facility];  Service: Cardiovascular;  Laterality: N/A;   TONSILLECTOMY     WISDOM TOOTH EXTRACTION      FAMILY HISTORY: Family History  Problem Relation Age of Onset   Heart disease Mother    Heart disease Father    Cancer Sister        lung   Cancer Brother        prostate   Syncope episode Neg Hx     SOCIAL HISTORY:  Social History   Socioeconomic History   Marital status: Married    Spouse name: geraldine   Number of children: 3   Years of education: some college   Highest education level: High school graduate  Occupational History   Occupation: retired  Tobacco Use   Smoking status: Former    Current packs/day: 0.00    Average packs/day: 1 pack/day for 30.0 years (30.0 ttl pk-yrs)    Types: Cigarettes    Start date: 12/16/1967    Quit date: 12/15/1997    Years since quitting: 25.3   Smokeless tobacco: Never  Vaping Use   Vaping status: Never Used  Substance and Sexual Activity   Alcohol use: Not Currently    Alcohol/week: 20.0 standard drinks of alcohol    Types: 20 Shots of liquor per week    Comment: OCCASIONAL, 12/28/17 stopped 11/05/17   Drug use: No   Sexual activity: Not on file  Other Topics Concern   Not on file  Social History Narrative   Lives with wife at home    caffeine use- occas, mostly decaf coffee   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    Social Drivers of Health   Financial Resource Strain: Low Risk  (11/18/2022)   Overall Financial Resource Strain (CARDIA)    Difficulty of Paying Living Expenses: Not hard at all  Food Insecurity: No Food Insecurity (02/22/2023)   Hunger Vital Sign    Worried About Running Out of Food in the Last Year: Never true    Ran Out of Food in the Last Year: Never true  Transportation Needs: No Transportation Needs  (02/22/2023)   PRAPARE - Administrator, Civil Service (Medical): No    Lack of Transportation (Non-Medical): No  Physical Activity: Not on file  Stress: Not on file  Social Connections: Not on file  Intimate Partner Violence: Not At Risk (02/22/2023)  Humiliation, Afraid, Rape, and Kick questionnaire    Fear of Current or Ex-Partner: No    Emotionally Abused: No    Physically Abused: No    Sexually Abused: No     PHYSICAL EXAM  GENERAL EXAM/CONSTITUTIONAL: Vitals:  Vitals:   04/14/23 1153  Weight: 218 lb (98.9 kg)  Height: 6' 3 (1.905 m)    Orthostatic VS for the past 24 hrs (Last 3 readings):  BP- Lying Pulse- Lying BP- Standing at 0 minutes Pulse- Standing at 0 minutes  04/14/23 1153 (!) 189/91 66 188/85 73     Body mass index is 27.25 kg/m. No results found. Patient is in no distress; well developed, nourished and groomed; neck is supple  CARDIOVASCULAR: Examination of carotid arteries is normal; no carotid bruits Regular rate and rhythm, no murmurs Examination of peripheral vascular system by observation and palpation is normal  EYES: Ophthalmoscopic exam of optic discs and posterior segments is normal; no papilledema or hemorrhages  MUSCULOSKELETAL: Gait, strength, tone, movements noted in Neurologic exam below  NEUROLOGIC: MENTAL STATUS:      No data to display         awake, alert, oriented to person, place and time recent and remote memory intact normal attention and concentration language fluent, comprehension intact, naming intact,  fund of knowledge appropriate  CRANIAL NERVE:  2nd - no papilledema on fundoscopic exam 2nd, 3rd, 4th, 6th - pupils equal and reactive to light, visual fields full to confrontation, extraocular muscles intact, no nystagmus 5th - facial sensation symmetric 7th - facial strength symmetric 8th - hearing intact 9th - palate elevates symmetrically, uvula midline 11th - shoulder shrug symmetric 12th -  tongue protrusion midline  MOTOR:  normal bulk and tone BUE 5; EXCEPT RIGHT DELTOID LIMITED BY PAIN BLE --> HF 3, KE 4, KF 4, DF 5  SENSORY:  normal and symmetric to light touch, temperature, vibration  COORDINATION:  finger-nose-finger, fine finger movements normal  REFLEXES:  deep tendon reflexes present and symmetric  GAIT/STATION:  narrow based gait; romberg is negative    DIAGNOSTIC DATA (LABS, IMAGING, TESTING) - I reviewed patient records, labs, notes, testing and imaging myself where available.  Lab Results  Component Value Date   WBC 8.3 03/23/2023   HGB 9.3 (L) 03/23/2023   HCT 28.7 (L) 03/23/2023   MCV 88 03/23/2023   PLT 200 03/23/2023      Component Value Date/Time   NA 138 02/23/2023 0821   NA 140 06/18/2016 0857   K 3.9 02/23/2023 0821   K 4.2 06/18/2016 0857   CL 105 02/23/2023 0821   CO2 22 02/23/2023 0821   CO2 25 06/18/2016 0857   GLUCOSE 142 (H) 02/23/2023 0821   GLUCOSE 126 06/18/2016 0857   BUN 27 (H) 02/23/2023 0821   BUN 14.7 06/18/2016 0857   CREATININE 1.70 (H) 02/23/2023 0821   CREATININE 1.73 (H) 07/15/2022 0912   CREATININE 1.1 06/18/2016 0857   CALCIUM 9.7 02/23/2023 0821   CALCIUM 10.1 06/18/2016 0857   PROT 7.3 02/22/2023 1116   PROT 7.8 06/18/2016 0857   ALBUMIN 3.5 02/22/2023 1116   ALBUMIN 4.0 06/18/2016 0857   AST 21 02/22/2023 1116   AST 22 07/15/2022 0912   AST 43 (H) 06/18/2016 0857   ALT 13 02/22/2023 1116   ALT 19 07/15/2022 0912   ALT 41 06/18/2016 0857   ALKPHOS 70 02/22/2023 1116   ALKPHOS 79 06/18/2016 0857   BILITOT 0.7 02/22/2023 1116   BILITOT 0.4 07/15/2022  0912   BILITOT 0.73 06/18/2016 0857   GFRNONAA 41 (L) 02/23/2023 0821   GFRNONAA 40 (L) 07/15/2022 0912   GFRAA >60 07/11/2019 1031   Lab Results  Component Value Date   CHOL 229 (H) 11/15/2022   HDL 51 11/15/2022   LDLCALC 117 (H) 11/15/2022   TRIG 307 (H) 11/15/2022   CHOLHDL 4.5 11/15/2022   Lab Results  Component Value Date   HGBA1C  5.0 11/06/2017   Lab Results  Component Value Date   VITAMINB12 231 12/02/2022   Lab Results  Component Value Date   TSH 2.870 12/02/2022    01/10/13 nuclear stress test 1.  No fixed or reversible defects to suggest inducible ischemia. 2. Normal left ventricular wall motion and thickening. 3. Normal ejection fraction.  02/03/15 MRI brain (with and without) [I reviewed images myself and agree with interpretation. -VRP]  1. No acute intracranial abnormality or mass. 2. Chronic right basal ganglia lacunar infarcts. 3. Mild cerebral and cerebellar atrophy.  09/07/15 EKG [I reviewed images myself and agree with interpretation. -VRP]  - normal sinus rhythm  10/23/15 EEG  - normal  11/06/17 carotid u/s - Right Carotid: Velocities in the right ICA are consistent with a 1-39% stenosis. - Left Carotid: Velocities in the left ICA are consistent with a 40-59% stenosis.  02/24/23 MRI brain  1. No acute intracranial abnormality. 2. Old right basal ganglia small vessel infarct and mild generalized volume loss.  02/24/23 EEG - This study is within normal limits. No seizures or epileptiform discharges were seen throughout the recording.    ASSESSMENT AND PLAN  78 y.o. year old male here with intermittent unprovoked episodes since 2014 of lightheadedness, presyncope, nausea, diaphoresis. Findings suspicious for generalized hypotension / hypoperfusion events.    Dx: recurrent syncope / transient hypotension  1. Recurrent syncope   2. Orthostatic lightheadedness   3. Chronic bilateral low back pain without sciatica   4. Leg weakness, bilateral   5. Dizziness   6. Symptomatic anemia     PLAN:  RECURRENT PRESYNCOPE / SYNCOPE (last event 02/22/23) - chronic issue since ~2014; history of orthostatic hypotension; now complicated by symptomatic anemia, hematuria with recent event in 2024 - do not suspect seizure at this time - check neuropathy labs (for autonomic dysfunction) -  continue hydration - continue alcohol abstinence - monitor BP at home - no driving until event free x at least 6 months - follow up with PCP and cardiology; consider to reimplant loop recorder (no abnl spells were captured previously)  Remote right basal ganglia infarct (no stroke symptoms; likely related to chronic small vessel ischemic disease) - continue medical mgmt (plavix , repatha, BP control)  ABNORMAL SPELL / UNRESPONSIVE SPELL (06/04/20; also had stopped etoh completely on 05/08/20) - no recurrent spells  GAIT DIFF / LEG WEAKNESS - check neuropathy labs - check MRI lumbar spine  LEFT CAROTID STENOSIS (asymptomatic) - vascular risk factor mgmt - follow up with vascular sx clinic  Orders Placed This Encounter  Procedures   MR Lumbar Spine W Wo Contrast   Vitamin B12   Hemoglobin A1c   TSH Rfx on Abnormal to Free T4   ANA,IFA RA Diag Pnl w/rflx Tit/Patn   Multiple Myeloma Panel (SPEP&IFE w/QIG)   CBC with Differential/Platelet   Return for pending if symptoms worsen or fail to improve, pending test results.  I reviewed images, labs, notes, records myself. I summarized findings and reviewed with patient, for this high risk condition (syncope; metastatic prostate CA; symptomatic  anemia) requiring high complexity decision making.    EDUARD FABIENE HANLON, MD 04/14/2023, 12:48 PM Certified in Neurology, Neurophysiology and Neuroimaging  [facility] [address] [place], [place] [address] [phone]

## 2023-04-14 NOTE — Patient Instructions (Addendum)
  RECURRENT SYNCOPE (last event 02/22/23) - chronic issue since ~2014; now complicated by anemia, hematuria with recent event - continue hydration - continue alcohol abstinence - monitor BP at home - no driving until event free x at least 6 months - follow up with PCP and cardiology; consider to reimplant loop recorder (no abnl spells were captured previously)  Remote right basal ganglia infarct (no stroke symptoms; likely related to chronic small vessel ischemic disease) - continue medical mgmt (plavix , repatha, BP control)

## 2023-04-15 LAB — TSH RFX ON ABNORMAL TO FREE T4: TSH: 1.73 u[IU]/mL (ref 0.450–4.500)

## 2023-04-20 ENCOUNTER — Telehealth: Payer: Self-pay

## 2023-04-20 LAB — ANA,IFA RA DIAG PNL W/RFLX TIT/PATN

## 2023-04-20 NOTE — Telephone Encounter (Signed)
 A1c slightly elevated at 6.1.  Anemia is persistent.  Other labs unremarkable. Advised to follow-up with PCP. He stated they are aware that he is borderline diabetic. Pt verbally understood result and was appreciative.

## 2023-04-20 NOTE — Telephone Encounter (Signed)
-----   Message from [facility] sent at 04/20/2023  2:10 PM EST ----- A1c slightly elevated at 6.1.  Anemia is persistent.  Other labs unremarkable.  Follow-up with PCP. Please call patient. -VRP

## 2023-04-21 ENCOUNTER — Telehealth: Payer: Self-pay | Admitting: Diagnostic Neuroimaging

## 2023-04-21 LAB — MULTIPLE MYELOMA PANEL, SERUM
Albumin SerPl Elph-Mcnc: 3.9 g/dL (ref 2.9–4.4)
Albumin/Glob SerPl: 1.2 (ref 0.7–1.7)
Alpha 1: 0.3 g/dL (ref 0.0–0.4)
Alpha2 Glob SerPl Elph-Mcnc: 0.8 g/dL (ref 0.4–1.0)
B-Globulin SerPl Elph-Mcnc: 1.4 g/dL — ABNORMAL HIGH (ref 0.7–1.3)
Gamma Glob SerPl Elph-Mcnc: 0.9 g/dL (ref 0.4–1.8)
Globulin, Total: 3.4 g/dL (ref 2.2–3.9)
IgA/Immunoglobulin A, Serum: 313 mg/dL (ref 61–437)
IgG (Immunoglobin G), Serum: 1040 mg/dL (ref 603–1613)
IgM (Immunoglobulin M), Srm: 69 mg/dL (ref 15–143)
Total Protein: 7.3 g/dL (ref 6.0–8.5)

## 2023-04-21 LAB — HEMOGLOBIN A1C
Est. average glucose Bld gHb Est-mCnc: 128 mg/dL
Hgb A1c MFr Bld: 6.1 % — ABNORMAL HIGH (ref 4.8–5.6)

## 2023-04-21 LAB — CBC WITH DIFFERENTIAL/PLATELET
Basophils Absolute: 0 10*3/uL (ref 0.0–0.2)
Basos: 0 %
EOS (ABSOLUTE): 0.1 10*3/uL (ref 0.0–0.4)
Eos: 1 %
Hematocrit: 28.9 % — ABNORMAL LOW (ref 37.5–51.0)
Hemoglobin: 9.3 g/dL — ABNORMAL LOW (ref 13.0–17.7)
Immature Grans (Abs): 0 10*3/uL (ref 0.0–0.1)
Immature Granulocytes: 0 %
Lymphocytes Absolute: 1.6 10*3/uL (ref 0.7–3.1)
Lymphs: 28 %
MCH: 29.2 pg (ref 26.6–33.0)
MCHC: 32.2 g/dL (ref 31.5–35.7)
MCV: 91 fL (ref 79–97)
Monocytes Absolute: 0.4 10*3/uL (ref 0.1–0.9)
Monocytes: 7 %
Neutrophils Absolute: 3.8 10*3/uL (ref 1.4–7.0)
Neutrophils: 64 %
Platelets: 229 10*3/uL (ref 150–450)
RBC: 3.18 x10E6/uL — ABNORMAL LOW (ref 4.14–5.80)
RDW: 17.1 % — ABNORMAL HIGH (ref 11.6–15.4)
WBC: 5.9 10*3/uL (ref 3.4–10.8)

## 2023-04-21 LAB — VITAMIN B12: Vitamin B-12: 292 pg/mL (ref 232–1245)

## 2023-04-21 LAB — ANA,IFA RA DIAG PNL W/RFLX TIT/PATN
Nucleolar Pattern: 5 U (ref 0–19)
Rheumatoid fact SerPl-aCnc: 12.3 [IU]/mL (ref <14.0)

## 2023-04-21 LAB — FANA STAINING PATTERNS: Speckled Pattern: 1:80 {titer}

## 2023-04-21 NOTE — Telephone Encounter (Signed)
[employer] medicare NPR sent to [employer] [phone]

## 2023-04-23 ENCOUNTER — Ambulatory Visit (HOSPITAL_COMMUNITY)
Admission: RE | Admit: 2023-04-23 | Discharge: 2023-04-23 | Disposition: A | Discharge status: Home / Self Care | Payer: Medicare Other | Source: Ambulatory Visit | Attending: Diagnostic Neuroimaging | Admitting: Diagnostic Neuroimaging

## 2023-04-23 DIAGNOSIS — R29898 Other symptoms and signs involving the musculoskeletal system: Secondary | ICD-10-CM | POA: Insufficient documentation

## 2023-04-23 MED ORDER — GADOBUTROL 1 MMOL/ML IV SOLN
10.0000 mL | Freq: Once | INTRAVENOUS | Status: AC | PRN
  Administered 2023-04-23: 10 mL via INTRAVENOUS

## 2023-05-07 NOTE — Progress Notes (Signed)
 ANA is positive, but other antibodies are negative. Likely false positive.  -VRP

## 2023-05-18 ENCOUNTER — Telehealth: Payer: Self-pay | Admitting: Diagnostic Neuroimaging

## 2023-05-18 NOTE — Telephone Encounter (Signed)
 Pt called stating that he has been waiting for a long time for his results and they have been in [place] since Feb. Pt would like a call back to see why he has not been called. Please advise.

## 2023-05-26 NOTE — Progress Notes (Signed)
-   prior radiation treatment and mets at L3, L4 levels; stable - multilevel degenerative changes at L2-3 and L5-S1 - recommend PT eval; consider pain mgmt if severe pain issues

## 2023-06-01 NOTE — Telephone Encounter (Signed)
 Called the pt back and was able to review the results and recommendations by  Dr Marjory Lies.  He asked that this be sent to PCP, urologist and and oncolologist. I have routed the information to all of the providers. Pt was appreciative and verbalized understanding.   Per Dr Marjory Lies - prior radiation treatment and mets at L3, L4 levels; stable - multilevel degenerative changes at L2-3 and L5-S1 - recommend PT eval; consider pain mgmt if severe pain issues

## 2023-06-01 NOTE — Telephone Encounter (Signed)
 Pt has called reporting that he is still waiting to be contacted with results to his MRI from 04-23-23

## 2023-06-25 ENCOUNTER — Telehealth: Payer: Self-pay | Admitting: Cardiovascular Disease

## 2023-06-25 NOTE — Telephone Encounter (Signed)
   Pt c/o of Chest Pain: STAT if active CP, including tightness, pressure, jaw pain, radiating pain to shoulder/upper arm/back, CP unrelieved by Nitro. Symptoms reported of SOB, nausea, vomiting, sweating.  1. Are you having CP right now? No    2. Are you experiencing any other symptoms (ex. SOB, nausea, vomiting, sweating)? Sob w/ exertion, sweating little more than normal daily   3. Is your CP continuous or coming and going? Coming and going   4. Have you taken Nitroglycerin? No   5. How long have you been experiencing CP? 1.5 month    6. If NO CP at time of call then end call with telling Pt to call back or call 911 if Chest pain returns prior to return call from triage team.

## 2023-06-25 NOTE — Telephone Encounter (Signed)
 Spoke with pt who complains of intermittent chest pain x 1.5 months along with increasing SOB.  Pt denies active chest pain today.  Heis taking medications as prescribed but has never tried taking Nitroglycerine as he is not sure how sever pain needs to be to use this medication.  He does not monitor his BP and HR daily but reports recent BP's have been 140-170/?.  He also reports increase diaphoresis with activity. Provided education on the use of Nitroglycerine, reviewed ED precautions.  Appointment scheduled with Dr Albert Huff, DOD for 06/26/2023 at120pm.  Pt verbalizes understanding and agrees with current plan.

## 2023-06-26 ENCOUNTER — Ambulatory Visit: Attending: Cardiology | Admitting: Cardiology

## 2023-06-26 ENCOUNTER — Encounter: Payer: Self-pay | Admitting: Cardiology

## 2023-06-26 VITALS — BP 160/90 | HR 71 | Ht 75.0 in | Wt 224.0 lb

## 2023-06-26 DIAGNOSIS — E785 Hyperlipidemia, unspecified: Secondary | ICD-10-CM

## 2023-06-26 DIAGNOSIS — Z9861 Coronary angioplasty status: Secondary | ICD-10-CM

## 2023-06-26 DIAGNOSIS — I10 Essential (primary) hypertension: Secondary | ICD-10-CM

## 2023-06-26 DIAGNOSIS — I25118 Atherosclerotic heart disease of native coronary artery with other forms of angina pectoris: Secondary | ICD-10-CM | POA: Diagnosis not present

## 2023-06-26 DIAGNOSIS — I5022 Chronic systolic (congestive) heart failure: Secondary | ICD-10-CM | POA: Diagnosis not present

## 2023-06-26 DIAGNOSIS — I2510 Atherosclerotic heart disease of native coronary artery without angina pectoris: Secondary | ICD-10-CM

## 2023-06-26 DIAGNOSIS — I6523 Occlusion and stenosis of bilateral carotid arteries: Secondary | ICD-10-CM

## 2023-06-26 DIAGNOSIS — D472 Monoclonal gammopathy: Secondary | ICD-10-CM

## 2023-06-26 DIAGNOSIS — I5020 Unspecified systolic (congestive) heart failure: Secondary | ICD-10-CM

## 2023-06-26 DIAGNOSIS — R072 Precordial pain: Secondary | ICD-10-CM

## 2023-06-26 DIAGNOSIS — K31811 Angiodysplasia of stomach and duodenum with bleeding: Secondary | ICD-10-CM

## 2023-06-26 MED ORDER — NITROGLYCERIN 0.4 MG SL SUBL: 0.4000 mg | SUBLINGUAL_TABLET | SUBLINGUAL | 1 refills | Status: AC | PRN

## 2023-06-26 MED ORDER — ISOSORBIDE MONONITRATE ER 30 MG PO TB24: 30.0000 mg | ORAL_TABLET | Freq: Every day | ORAL | 3 refills | Status: DC

## 2023-06-26 NOTE — Patient Instructions (Signed)
 Medication Instructions:  Your physician has recommended you make the following change in your medication:   START Isosorbide  Mononitrate (Imdur ) 30 mg once daily in the evening  Refill for nitroglycerin  has been sent to your pharmacy.   *If you need a refill on your cardiac medications before your next appointment, please call your pharmacy*  Lab Work: None ordered today. If you have labs (blood work) drawn today and your tests are completely normal, you will receive your results only by: MyChart Message (if you have MyChart) OR A paper copy in the mail If you have any lab test that is abnormal or we need to change your treatment, we will call you to review the results.  Testing/Procedures: Your physician has requested that you have a carotid duplex in December 2025. This test is an ultrasound of the carotid arteries in your neck. It looks at blood flow through these arteries that supply the brain with blood. Allow one hour for this exam. There are no restrictions or special instructions.   Follow-Up: At [facility], you and your health needs are our priority.  As part of our continuing mission to provide you with exceptional heart care, we have created designated Provider Care Teams.  These Care Teams include your primary Cardiologist (physician) and Advanced Practice Providers (APPs -  Physician Assistants and Nurse Practitioners) who all work together to provide you with the care you need, when you need it.  We recommend signing up for the patient portal called "MyChart".  Sign up information is provided on this After Visit Summary.  MyChart is used to connect with patients for Virtual Visits (Telemedicine).  Patients are able to view lab/test results, encounter notes, upcoming appointments, etc.  Non-urgent messages can be sent to your provider as well.   To learn more about what you can do with MyChart, go to [email or web address].    Your next appointment:   3-4 weeks with Dr.  Stann Earnest  The format for your next appointment:   In Person  Provider:   Janelle Mediate, MD{  Other Instructions    1st Floor: - Lobby - Registration  - Pharmacy  - Lab - Cafe  2nd Floor: - PV Lab - Diagnostic Testing (echo, CT, nuclear med)  3rd Floor: - Vacant  4th Floor: - TCTS (cardiothoracic surgery) - AFib Clinic - Structural Heart Clinic - Vascular Surgery  - Vascular Ultrasound  5th Floor: - [facility]Care Cardiology (general and EP) - Clinical Pharmacy for coumadin, hypertension, lipid, weight-loss medications, and med management appointments    Valet parking services will be available as well.

## 2023-06-26 NOTE — Progress Notes (Signed)
 Cardiology Office Note:  .   ID:  Braylon Grenda., DOB 02/07/1946, MRN [id number] PCP: Rexanne Ingle, MD  [facility] Providers Cardiologist:  Maude Emmer, MD {  Chief Complaint  Patient presents with   Follow-up    Chest pain    History of Present Illness: .   Shaul Trautman. is a 78 y.o. male with a past medical history of NSTEMI s/p PCI to the LAD on 11/16/22, hx of MGUS, hx of prostate cancer s/p radiotherapy on Xtandi , recurrent GI bleeds s/p EGD and capsule endoscopy and HFrEF (LVEF 40-45%).  Presents today for sick visit to be evaluated for chest pain with DOD.  As per electronic medical records primary cardiologist is Dr. Maude Emmer.   Patient has history of NSTEMI status post PCI to the LAD back in September 2024 as well as a known CTO of the RCA.  Due to history of GI bleed requiring blood transfusions in the past he is no longer on dual antiplatelet therapy and remains on Plavix  75 mg p.o. daily.  Patient presents today for evaluation as he has been experiencing chest pain for the last 1.5 months. Chest pain is located on the left anterior chest wall. Intensity 2-3 out of 10. Daily occurrence. No more than couple minutes. Self-limited.   No use of sublingual nitroglycerin  tablets. Does not always happen with exertion but if it does the symptoms resolve with resting it. Associated symptoms: Dyspnea on exertion  Patient states the symptoms are similar to when he had his PCI but overall intensity frequency and duration is much less.  He does have a history of GI bleed requiring multiple blood transfusions in the past.  Patient states that he had his hemoglobin checked with his GI provider yesterday and it was 10.7 g/dL.  I do not have those labs available for review.  He also has uncontrolled hypertension in the recent past with home blood pressures around 180-190 mmHg.  He has his chronic feeling of hot flashes/sweating which he initially thought  was secondary to his cancer medications but despite discontinuation send symptoms continue.  He is not sure if there is underlying cause.  ROS:  Review of Systems  Constitutional: Positive for malaise/fatigue.  Cardiovascular:  Positive for chest pain and dyspnea on exertion. Negative for claudication, irregular heartbeat, leg swelling, near-syncope, orthopnea, palpitations, paroxysmal nocturnal dyspnea and syncope.  Hematologic/Lymphatic: Negative for bleeding problem.    Studies Reviewed: SABRA   EKG Interpretation Date/Time:  Friday June 26 2023 13:20:50 EDT Ventricular Rate:  73 PR Interval:  150 QRS Duration:  104 QT Interval:  424 QTC Calculation: 467 R Axis:   -37  Text Interpretation: Normal sinus rhythm with sinus arrhythmia Left axis deviation Left ventricular hypertrophy ( R in aVL , Cornell product , Romhilt-Estes ) When compared with ECG of 23-Feb-2023 09:53, Premature atrial complexes are no longer Present No significant change since last tracing Confirmed by Michele Richardson [id number]) on 06/26/2023 1:34:44 PM    Echocardiogram 02/23/2023 IMPRESSIONS   1. Left ventricular ejection fraction, by estimation, is 55 to 60%. The  left ventricle has normal function. The left ventricle has no regional  wall motion abnormalities. Left ventricular diastolic parameters are  consistent with Grade I diastolic  dysfunction (impaired relaxation).   2. Right ventricular systolic function is normal. The right ventricular  size is normal.   3. The mitral valve is normal in structure. Trivial mitral valve  regurgitation. No evidence of mitral stenosis.  4. The aortic valve is normal in structure. Aortic valve regurgitation is  trivial. No aortic stenosis is present.   5. The inferior vena cava is normal in size with greater than 50%  respiratory variability, suggesting right atrial pressure of 3 mmHg.   Coronary intervention 11/16/2022: LM: 20% disease LAD: Prox-mid 95% calcific stenosis  (culprit lesion)          Mid 30% disease Lcx: Prox 50% stenosis        OM1 50% stenosis RCA: Prox CTO. Left-to-right collaterals     Temporary transvenous pacemaker placement (removed at the end of the case) Intravascular ultrasound (IVUS) Successful percutaneous coronary intervention mid LAD        PTCA and stent placement 3.5 X 20 mm Synergy drug-eluting stent        Post dilatation using 3.75X12 and 4.0X12 mm [place] balloons up to 20 atm        Culprit lesion 95%--0% stenosis, TIMI flow I-->III, MSA 10.6 mm2        Loss of one septal branch with mild burning sensation. No conduction abnormality noted, in fact rate is improved from 40 bpm to >60 bpm, not requiring any temporary pacing. Recommend overnight IV NTG.  Carotid Duplex 02/2023: Right Carotid: Velocities in the right ICA are consistent with a 1-39% stenosis.   Left Carotid: Velocities in the left ICA are consistent with a 40-59% stenosis.   Vertebrals: Bilateral vertebral arteries demonstrate antegrade flow.  Subclavians: Normal flow hemodynamics were seen in bilateral subclavian arteries.   Physical Exam:   VS:  BP (!) 160/90   Pulse 71   Ht 6' 3 (1.905 m)   Wt 224 lb 0.6 oz (101.6 kg)   SpO2 97%   BMI 28.00 kg/m    Wt Readings from Last 3 Encounters:  06/26/23 224 lb 0.6 oz (101.6 kg)  04/14/23 218 lb (98.9 kg)  03/23/23 216 lb 9.6 oz (98.2 kg)    GEN: Well nourished, well developed in no acute distress NECK: No JVD; mild bilateral carotid bruits CARDIAC: RRR, no murmurs, rubs, gallops RESPIRATORY:  Clear to auscultation without rales, wheezing or rhonchi  ABDOMEN: Soft, non-tender, non-distended EXTREMITIES:  No edema; No deformity   ASSESSMENT AND PLAN: .      ICD-10-CM   1. Atherosclerosis of native coronary artery of native heart with stable angina pectoris (HCC)  I25.118 EKG 12-Lead    nitroGLYCERIN  (NITROSTAT ) 0.4 MG SL tablet    2. CAD S/P percutaneous coronary angioplasty  I25.10 EKG 12-Lead    Z98.61 nitroGLYCERIN  (NITROSTAT ) 0.4 MG SL tablet    3. Precordial pain  R07.2 EKG 12-Lead    isosorbide  mononitrate (IMDUR ) 30 MG 24 hr tablet    nitroGLYCERIN  (NITROSTAT ) 0.4 MG SL tablet    4. Heart failure with mildly reduced ejection fraction (HFmrEF) (HCC)  I50.22     5. Essential hypertension  I10     6. Dyslipidemia  E78.5     7. Carotid atherosclerosis, bilateral  I65.23 VAS US  CAROTID    8. 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 MGUS (monoclonal gammopathy of unknown significance)  D47.2      Atherosclerosis of native coronary artery of native heart with stable angina pectoris (HCC) CAD S/P percutaneous coronary angioplasty Precordial pain Chronic / progressive / sick visit.  Patient states that his symptoms are similar to when he had his stents back in September 2024 but much LESS in intensity  frequency and duration. No active chest pain EKG is also nonischemic Antianginal therapy: None Will start Imdur  30 mg p.o. daily. Refill sublingual nitroglycerin  tablets to use on as needed basis. Consider beta-blockers at the next visit (uptitrate slowly given his history of bradycardia and requiring TVP in the past) Focus on better blood pressure management. Patient does not endorse evidence of GI bleed-had his hemoglobin checked yesterday with his GI provider which was 10.7 g/dL, per family Will focus on up titration of antianginal therapy at this time.  However in the interim if the symptoms increase in intensity frequency or duration he is advised to go to the hospital for further evaluation and management.  Otherwise would recommend close outpatient follow-up for further up titration of GDMT and antianginal therapy and if symptoms persist to discuss ischemic evaluation.  Patient and wife agreeable with the plan of care.  Heart failure with mildly reduced ejection fraction (HFmrEF) (HCC) Chronic and stable NYHA  class II/III Not in overt heart failure. Continue losartan  25 mg p.o. daily Start Imdur  as discussed above. Recommend slow up titration of GDMT given his history of orthostatic hypotension and syncope  Essential hypertension Chronic and Progressive. States that his recent home blood pressures have been ranging between 180-190 mmHg. Office blood pressure is also not well-controlled. Continue losartan  25 mg p.o. daily. Start Imdur  as discussed above.  Patient is aware of drug-drug interactions between long-acting nitrates and medications for erectile dysfunction/BPH which include but not limited to Viagra/sildenafil, Cialis/tadalafil, Levitra/vardenafil, etc.  Both of these class of medications should not be taken concomitantly as it can lead to worsening morbidity and mortality. I have asked her to keep a log of his blood pressures and to review with PCP/primary cardiologist to see if additional medication titration is warranted Reemphasized importance of low-salt diet.  Dyslipidemia Chronic and stable Currently on Repatha.  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 Follows with GI provider on a regular basis.  Follow-up in 3 to 4 weeks w/ Dr. Nishan for re-evaluation.   Madonna Large, DO, [facility]  [facility]  [address] [place], [place] [address]

## 2023-06-28 ENCOUNTER — Encounter: Payer: Self-pay | Admitting: Cardiology

## 2023-07-15 ENCOUNTER — Ambulatory Visit: Payer: Medicare Other | Admitting: Cardiovascular Disease

## 2023-07-30 ENCOUNTER — Ambulatory Visit (HOSPITAL_COMMUNITY)
Admission: RE | Admit: 2023-07-30 | Discharge: 2023-07-30 | Disposition: A | Discharge status: Home / Self Care | Source: Ambulatory Visit | Attending: Cardiology | Admitting: Cardiology

## 2023-07-30 VITALS — BP 146/82 | HR 70 | Wt 224.2 lb

## 2023-07-30 DIAGNOSIS — Z7902 Long term (current) use of antithrombotics/antiplatelets: Secondary | ICD-10-CM | POA: Diagnosis not present

## 2023-07-30 DIAGNOSIS — I10 Essential (primary) hypertension: Secondary | ICD-10-CM

## 2023-07-30 DIAGNOSIS — E785 Hyperlipidemia, unspecified: Secondary | ICD-10-CM | POA: Diagnosis not present

## 2023-07-30 DIAGNOSIS — C7951 Secondary malignant neoplasm of bone: Secondary | ICD-10-CM | POA: Diagnosis not present

## 2023-07-30 DIAGNOSIS — Z923 Personal history of irradiation: Secondary | ICD-10-CM | POA: Insufficient documentation

## 2023-07-30 DIAGNOSIS — K921 Melena: Secondary | ICD-10-CM | POA: Diagnosis not present

## 2023-07-30 DIAGNOSIS — I130 Hypertensive heart and chronic kidney disease with heart failure and stage 1 through stage 4 chronic kidney disease, or unspecified chronic kidney disease: Secondary | ICD-10-CM | POA: Diagnosis not present

## 2023-07-30 DIAGNOSIS — Z8546 Personal history of malignant neoplasm of prostate: Secondary | ICD-10-CM | POA: Diagnosis not present

## 2023-07-30 DIAGNOSIS — I2510 Atherosclerotic heart disease of native coronary artery without angina pectoris: Secondary | ICD-10-CM | POA: Diagnosis not present

## 2023-07-30 DIAGNOSIS — I255 Ischemic cardiomyopathy: Secondary | ICD-10-CM | POA: Diagnosis not present

## 2023-07-30 DIAGNOSIS — N1831 Chronic kidney disease, stage 3a: Secondary | ICD-10-CM | POA: Diagnosis not present

## 2023-07-30 DIAGNOSIS — I5022 Chronic systolic (congestive) heart failure: Secondary | ICD-10-CM | POA: Diagnosis present

## 2023-07-30 DIAGNOSIS — Z955 Presence of coronary angioplasty implant and graft: Secondary | ICD-10-CM | POA: Insufficient documentation

## 2023-07-30 DIAGNOSIS — Z79899 Other long term (current) drug therapy: Secondary | ICD-10-CM | POA: Diagnosis not present

## 2023-07-30 LAB — BRAIN NATRIURETIC PEPTIDE: B Natriuretic Peptide: 115.9 pg/mL — ABNORMAL HIGH (ref 0.0–100.0)

## 2023-07-30 MED ORDER — LOSARTAN POTASSIUM 100 MG PO TABS: 100.0000 mg | ORAL_TABLET | Freq: Every day | ORAL | 6 refills | Status: AC

## 2023-07-30 NOTE — Progress Notes (Signed)
 ADVANCED HEART FAILURE CLINIC NOTE  Referring Physician: Merl Star, MD  Primary Care: Merl Star, MD Primary Cardiologist: Janelle Mediate HF: Dr. Bruce Caper  HPI: Ricky Rowland. is a 78 y.o. male with NSTEMI s/p PCI to the LAD on 11/16/22, hx of MGUS, hx of prostate cancer s/p radiotherapy on Xtandi , recurrent GI bleeds s/p EGD and capsule endoscopy  and HFrEF (LVEF 40-45%) presenting today to establish care.   He most recently presented to the hospital in early October 2024 with a hemoglobin of 6.7 after several days of melenic stools.  His Effient /aspirin  were transition to Plavix  only.  EGD was without clear source of bleeding; underwent video capsule endoscopy that demonstrated multiple AVMs that are now status post APC.   Today he presents to establish care for HFrEF with mildly reduced EF 2/2 CTO of the RCA. From a functional standpoint, he is doing much better after receiving blood transfusions during his hospitalization. He has minimal dyspnea, no PND, LE edema or lightheadedness. Compliant with all medications.   Activity level/exercise tolerance:  NYHA II Orthopnea:  Sleeps on 2 pillows Paroxysmal noctural dyspnea:  No Chest pain/pressure:  no Orthostatic lightheadedness:  no Palpitations:  no Lower extremity edema:  no Presyncope/syncope:  no Cough:  no Current Outpatient Medications  Medication Sig Dispense Refill   acetaminophen  (TYLENOL ) 650 MG CR tablet Take 650 mg by mouth every 8 (eight) hours as needed for pain.     allopurinol  (ZYLOPRIM ) 300 MG tablet Take 300 mg by mouth daily.     clopidogrel  (PLAVIX ) 75 MG tablet Take 75 mg by mouth daily.     docusate sodium  (COLACE) 100 MG capsule Take 2 capsules (200 mg total) by mouth daily as needed for mild constipation. 100 capsule 0   ferrous sulfate 325 (65 FE) MG tablet Take 325 mg by mouth daily with breakfast.     isosorbide  mononitrate (IMDUR ) 30 MG 24 hr tablet Take 1 tablet (30 mg total) by mouth  daily. 30 tablet 3   losartan  (COZAAR ) 25 MG tablet Take 25 mg by mouth daily.     Multiple Vitamins-Minerals (MULTIVITAMIN ADULT EXTRA C PO) Take 1 tablet by mouth daily.     nitroGLYCERIN  (NITROSTAT ) 0.4 MG SL tablet Place 1 tablet (0.4 mg total) under the tongue every 5 (five) minutes as needed for chest pain (up to 3 doses). 25 tablet 1   ORGOVYX  120 MG tablet Take 120 mg by mouth daily. (Patient not taking: Reported on 06/26/2023)     pantoprazole  (PROTONIX ) 40 MG tablet Take 1 tablet (40 mg total) by mouth 2 (two) times daily before a meal. 180 tablet 0   polyethylene glycol powder (GLYCOLAX /MIRALAX ) 17 GM/SCOOP powder Take 1 capful (17 g) with water by mouth daily as needed for mild constipation. 238 g 0   REPATHA SURECLICK 140 MG/ML SOAJ Inject 140 mg into the skin every 14 (fourteen) days.      tolnaftate (TINACTIN) 1 % cream Apply 1 application topically daily as needed (jock itch).     XTANDI  40 MG capsule Take 160 mg by mouth daily. (Patient not taking: Reported on 06/26/2023)     No current facility-administered medications for this encounter.   PHYSICAL EXAM: There were no vitals filed for this visit. GENERAL: NAD Lungs- *** CARDIAC:  JVP: *** cm          Normal rate with regular rhythm. *** murmur.  Pulses ***. *** edema.  ABDOMEN: Soft, non-tender, non-distended.  EXTREMITIES: Warm  and well perfused.  NEUROLOGIC: No obvious FND   DATA REVIEW  ECG: 12/18/22: Sinus bradycardia  As per my personal interpretation  ECHO: 11/15/22: LVEF 40-45%, normal RV function  CATH: 11/16/22:  LM: 20% disease LAD: Prox-mid 95% calcific stenosis (culprit lesion)          Mid 30% disease Lcx: Prox 50% stenosis        OM1 50% stenosis RCA: Prox CTO. 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 Continue imdur  30mg  daily.  Beta-Blocker: HR in the 60s-70s with hx of syncope and bradycardia. Will start toprol  12.5mg  daily at follow up. I reviewed his home BP logs. HR in the low 60s frequently.  UJW:JXBJYNW currently; repeat labs in 1 week.  Cardiometabolic:Repeat labs in 1 week; will start farxiga at that time. Starting losartan  today.  Devices therapies & Valvulopathies:Not indicated Advanced therapies:Not indicated.   2. CAD - CTO of the RCA - S/P PCI to the LAD - continue single antiplatelet with plavix  - chest pain has resolved with addition of imdur  30mg   3. 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 - Repeat CBC in 1 week - Has received 3U PRBC in the past month according to him.   4. GNF6O - Repeat labs today  5. 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 Hx of syncope - Loop recorder placed with no significant findings. Now explanted.   7.  Dyslipidemia -Currently on Repatha.  Reports compliance.   8. HTN  - Increase losartan  to 100mg  daily.   LVEF has recovered. Graduate from [facility] clinic. Will refer back to Dr. Bjorn Bullocks Advanced Heart Failure Mechanical Circulatory Support

## 2023-07-30 NOTE — Patient Instructions (Signed)
 Great to see you today!!!  Medication Changes:  INCREASE Losartan  to 100 mg Daily  Lab Work:  Labs done today, your results will be available in MyChart, we will contact you for abnormal readings.  Special Instructions // Education:  Do the following things EVERYDAY: Weigh yourself in the morning before breakfast. Write it down and keep it in a log. Take your medicines as prescribed Eat low salt foods--Limit salt (sodium) to 2000 mg per day.  Stay as active as you can everyday Limit all fluids for the day to less than 2 liters   Follow-Up in: as needed   At the [facility], you and your health needs are our priority. We have a designated team specialized in the treatment of Heart Failure. This Care Team includes your primary Heart Failure Specialized Cardiologist (physician), Advanced Practice Providers (APPs- Physician Assistants and Nurse Practitioners), and Pharmacist who all work together to provide you with the care you need, when you need it.   You may see any of the following providers on your designated Care Team at your next follow up:  Dr. Jules Oar Dr. Peder Bourdon Dr. Alwin Baars Dr. Judyth Nunnery Nieves Bars, NP Ruddy [place], [place] [facility] [place], [place] Dennise Fitz, NP [place] Lee, NP Luster Salters, PharmD   Please be sure to bring in all your medications bottles to every appointment.   Need to Contact Us :  If you have any questions or concerns before your next appointment please send us  a message through [place] or call our office at [phone].    TO LEAVE A MESSAGE FOR THE NURSE SELECT OPTION 2, PLEASE LEAVE A MESSAGE INCLUDING: YOUR NAME DATE OF BIRTH CALL BACK NUMBER REASON FOR CALL**this is important as we prioritize the call backs  YOU WILL RECEIVE A CALL BACK THE SAME DAY AS LONG AS YOU CALL BEFORE 4:00 PM

## 2023-08-11 NOTE — Progress Notes (Signed)
 CARDIOLOGY CONSULT NOTE       Patient ID: Ricky GASSMANN Sr. MRN: [id number] DOB/AGE: 1945/12/23 78 y.o.   Primary Physician: Merl Star, MD Primary Cardiologist: Elese Rane/Sabharwal Reason for Consultation: CHF    HPI:  78 y.o. I have not seen in a while Followed more recently by CHF Dr Bruce Caper. NSTEMI s/p PCI to the LAD on 11/16/22 and known CTO RCA , hx of MGUS, hx of prostate cancer s/p radiotherapy on Xtandi , recurrent GI bleeds s/p EGD and capsule endoscopy and HFrEF (LVEF 40-45%)   Hospitalized with Hb 6.15 December 2022. Transitioned to plavix  only AVM;s post APC. TTE 02/23/23 EF 55-60% normal RV no significant valve dx. Dyspnea improved with Rx anemia.   Duplex 02/24/23 showed 40-59% Left ICA stenosis   Labs 07/30/23 BNP 115 Hct 28.9 04/14/23  Cr 1.7 02/23/23   Has little pain despite lumbar mets just using tylenol   Urinating fine   ROS All other systems reviewed and negative except as noted above  Past Medical History:  Diagnosis Date   Acute kidney injury superimposed on chronic kidney disease (HCC) 11/19/2022   Borderline diabetes    DIET CONTROLLED   Bradycardia 11/17/2022   Dyslipidemia    Elevated PSA    External carotid artery stenosis    MILD-- BILATERAL   Fall 11/05/2017   GERD (gastroesophageal reflux disease)    Gout, arthritis    Hearing loss in left ear    Heart murmur    MILD --  ASYMPTOMATIC   History of gastric ulcer    REMOTE HX   Hypertension    Pre-diabetes    Prostate cancer (HCC) 12/21/2012   Gleason 3+4=7   Seizure-like activity (HCC)    Stroke (HCC)    Syncope and collapse    Wears glasses     Family History  Problem Relation Age of Onset   Heart disease Mother    Heart disease Father    Cancer Sister        lung   Cancer Brother        prostate   Syncope episode Neg Hx     Social History   Socioeconomic History   Marital status: Married    Spouse name: Ricky Rowland   Number of children: 3   Years of education: some  college   Highest education level: High school graduate  Occupational History   Occupation: retired  Tobacco Use   Smoking status: Former    Current packs/day: 0.00    Average packs/day: 1 pack/day for 30.0 years (30.0 ttl pk-yrs)    Types: Cigarettes    Start date: 12/16/1967    Quit date: 12/15/1997    Years since quitting: 25.6   Smokeless tobacco: Never  Vaping Use   Vaping status: Never Used  Substance and Sexual Activity   Alcohol use: Not Currently    Alcohol/week: 20.0 standard drinks of alcohol    Types: 20 Shots of liquor per week    Comment: OCCASIONAL, 12/28/17 stopped 11/05/17   Drug use: No   Sexual activity: Not on file  Other Topics Concern   Not on file  Social History Narrative   Lives with wife at home    caffeine use- occas, mostly decaf coffee   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    Social Drivers of Health   Financial Resource Strain: Low Risk  (11/18/2022)   Overall Financial Resource Strain (CARDIA)    Difficulty of Paying Living Expenses: Not hard at all  Food Insecurity: No  Food Insecurity (02/22/2023)   Hunger Vital Sign    Worried About Running Out of Food in the Last Year: Never true    Ran Out of Food in the Last Year: Never true  Transportation Needs: No Transportation Needs (02/22/2023)   PRAPARE - Administrator, Civil Service (Medical): No    Lack of Transportation (Non-Medical): No  Physical Activity: Not on file  Stress: Not on file  Social Connections: Not on file  Intimate Partner Violence: Not At Risk (02/22/2023)   Humiliation, Afraid, Rape, and Kick questionnaire    Fear of Current or Ex-Partner: No    Emotionally Abused: No    Physically Abused: No    Sexually Abused: No    Past Surgical History:  Procedure Laterality Date   CORONARY STENT INTERVENTION N/A 11/16/2022   Procedure: CORONARY STENT INTERVENTION;  Surgeon: Cody Das, MD;  Location: [facility];  Service: Cardiovascular;  Laterality: N/A;   CORONARY  ULTRASOUND/IVUS N/A 11/16/2022   Procedure: Coronary Ultrasound/IVUS;  Surgeon: Cody Das, MD;  Location: [facility];  Service: Cardiovascular;  Laterality: N/A;   ENTEROSCOPY N/A 12/21/2022   Procedure: ENTEROSCOPY;  Surgeon: Ozell Blunt, MD;  Location: [facility] ENDOSCOPY;  Service: Gastroenterology;  Laterality: N/A;   ESOPHAGOGASTRODUODENOSCOPY (EGD) WITH PROPOFOL  N/A 12/02/2022   Procedure: ESOPHAGOGASTRODUODENOSCOPY (EGD) WITH PROPOFOL ;  Surgeon: Baldo Bonds, MD;  Location: [facility] ENDOSCOPY;  Service: Gastroenterology;  Laterality: N/A;   ESOPHAGOGASTRODUODENOSCOPY (EGD) WITH PROPOFOL  N/A 12/18/2022   Procedure: ESOPHAGOGASTRODUODENOSCOPY (EGD) WITH PROPOFOL ;  Surgeon: Genell Ken, MD;  Location: [facility] ENDOSCOPY;  Service: Gastroenterology;  Laterality: N/A;   GIVENS CAPSULE STUDY N/A 12/18/2022   Procedure: GIVENS CAPSULE STUDY;  Surgeon: Genell Ken, MD;  Location: [facility] ENDOSCOPY;  Service: Gastroenterology;  Laterality: N/A;   HOT HEMOSTASIS N/A 12/21/2022   Procedure: HOT HEMOSTASIS (ARGON PLASMA COAGULATION/BICAP);  Surgeon: Ozell Blunt, MD;  Location: [facility] ENDOSCOPY;  Service: Gastroenterology;  Laterality: N/A;  Straight Fire Probe   KNEE ARTHROSCOPY W/ MENISCAL REPAIR Left 03/10/2012   LEFT HEART CATH AND CORONARY ANGIOGRAPHY N/A 11/16/2022   Procedure: LEFT HEART CATH AND CORONARY ANGIOGRAPHY;  Surgeon: Cody Das, MD;  Location: [facility];  Service: Cardiovascular;  Laterality: N/A;   LOOP RECORDER INSERTION N/A 01/05/2018   Procedure: LOOP RECORDER INSERTION;  Surgeon: Verona Goodwill, MD;  Location: [facility] INVASIVE CV LAB;  Service: Cardiovascular;  Laterality: N/A;   LUMBAR SPINE SURGERY  03/10/1989   PROSTATE BIOPSY N/A 12/21/2012   Procedure: BIOPSY TRANSRECTAL ULTRASONIC PROSTATE (TUBP);  Surgeon: Jinny Mounts, MD;  Location: [facility];  Service: Urology;  Laterality: N/A;   PROSTATE BIOPSY  11/26/2011   benign   TEMPORARY  PACEMAKER N/A 11/16/2022   Procedure: TEMPORARY PACEMAKER;  Surgeon: Cody Das, MD;  Location: [facility];  Service: Cardiovascular;  Laterality: N/A;   TONSILLECTOMY     WISDOM TOOTH EXTRACTION        Current Outpatient Medications:    acetaminophen  (TYLENOL ) 650 MG CR tablet, Take 650 mg by mouth every 8 (eight) hours as needed for pain., Disp: , Rfl:    allopurinol  (ZYLOPRIM ) 300 MG tablet, Take 300 mg by mouth daily., Disp: , Rfl:    clopidogrel  (PLAVIX ) 75 MG tablet, Take 75 mg by mouth daily., Disp: , Rfl:    docusate sodium  (COLACE) 100 MG capsule, Take 2 capsules (200 mg total) by mouth daily as needed for mild constipation., Disp: 100 capsule, Rfl:  0   ferrous sulfate 325 (65 FE) MG tablet, Take 325 mg by mouth daily with breakfast., Disp: , Rfl:    isosorbide  mononitrate (IMDUR ) 30 MG 24 hr tablet, Take 1 tablet (30 mg total) by mouth daily., Disp: 30 tablet, Rfl: 3   losartan  (COZAAR ) 100 MG tablet, Take 1 tablet (100 mg total) by mouth daily., Disp: 30 tablet, Rfl: 6   Multiple Vitamins-Minerals (MULTIVITAMIN ADULT EXTRA C PO), Take 1 tablet by mouth daily., Disp: , Rfl:    nitroGLYCERIN  (NITROSTAT ) 0.4 MG SL tablet, Place 1 tablet (0.4 mg total) under the tongue every 5 (five) minutes as needed for chest pain (up to 3 doses)., Disp: 25 tablet, Rfl: 1   pantoprazole  (PROTONIX ) 40 MG tablet, Take 1 tablet (40 mg total) by mouth 2 (two) times daily before a meal., Disp: 180 tablet, Rfl: 0   polyethylene glycol powder (GLYCOLAX /MIRALAX ) 17 GM/SCOOP powder, Take 1 capful (17 g) with water by mouth daily as needed for mild constipation., Disp: 238 g, Rfl: 0   REPATHA SURECLICK 140 MG/ML SOAJ, Inject 140 mg into the skin every 14 (fourteen) days. , Disp: , Rfl:    tolnaftate (TINACTIN) 1 % cream, Apply 1 application topically daily as needed (jock itch)., Disp: , Rfl:     Physical Exam: Blood pressure 118/82, pulse 68, height 6' 3 (1.905 m), weight 221 lb (100.2  kg), SpO2 99%.   Affect appropriate Healthy:  appears stated age HEENT: normal Neck supple with no adenopathy JVP normal no bruits no thyromegaly Lungs clear with no wheezing and good diaphragmatic motion Heart:  S1/S2 no murmur, no rub, gallop or click PMI normal Abdomen: benighn, BS positve, no tenderness, no AAA no bruit.  No HSM or HJR Distal pulses intact with no bruits No edema Neuro non-focal Skin warm and dry No muscular weakness   Labs:   Lab Results  Component Value Date   WBC 5.9 04/14/2023   HGB 9.3 (L) 04/14/2023   HCT 28.9 (L) 04/14/2023   MCV 91 04/14/2023   PLT 229 04/14/2023   No results for input(s): NA, K, CL, CO2, BUN, CREATININE, CALCIUM, PROT, BILITOT, ALKPHOS, ALT, AST, GLUCOSE in the last 168 hours.  Invalid input(s): LABALBU No results found for: CKTOTAL, CKMB, CKMBINDEX, TROPONINI  Lab Results  Component Value Date   CHOL 229 (H) 11/15/2022   CHOL 168 11/06/2017   Lab Results  Component Value Date   HDL 51 11/15/2022   HDL 68 11/06/2017   Lab Results  Component Value Date   LDLCALC 117 (H) 11/15/2022   LDLCALC 67 11/06/2017   Lab Results  Component Value Date   TRIG 307 (H) 11/15/2022   TRIG 166 (H) 11/06/2017   Lab Results  Component Value Date   CHOLHDL 4.5 11/15/2022   CHOLHDL 2.5 11/06/2017   No results found for: LDLDIRECT    Radiology: No results found.  EKG: SR rate 73 LAD 06/26/23    ASSESSMENT AND PLAN:  Heart Failure with reduced EF  - EF normalized by TTE 02/23/23  - ischemic DCM with CTO RCA and stent to LAD 11/16/22 - BNP normal  - Losartan  and imdur  RX - runs bradycardic   - Euvolemic   2. CAD - CTO of the RCA - S/P PCI to the LAD - continue single antiplatelet with plavix  - chest pain has resolved with addition of imdur  30mg    3. 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 Received 3 units PRB'c -Update Hct   - on iron    4. CKD3a - Cr 1.7-2.0  -BMET    5. Prostate ca  - s/p XRT; bony mets in his spine - Xtandi    6. Hx of syncope - Loop recorder placed with no significant findings. Now explanted.    7.  Dyslipidemia -Currently on Repatha.  Reports compliance.   8. HTN  - Increase losartan  to 100mg  daily.  D/C Plavix  in September  F/U in 6 months   Signed: Janelle Mediate 08/21/2023, 10:13 AM

## 2023-08-21 ENCOUNTER — Ambulatory Visit: Payer: Medicare Other | Attending: Cardiovascular Disease | Admitting: Cardiovascular Disease

## 2023-08-21 VITALS — BP 118/82 | HR 68 | Ht 75.0 in | Wt 221.0 lb

## 2023-08-21 DIAGNOSIS — I10 Essential (primary) hypertension: Secondary | ICD-10-CM | POA: Diagnosis not present

## 2023-08-21 DIAGNOSIS — Z9861 Coronary angioplasty status: Secondary | ICD-10-CM

## 2023-08-21 DIAGNOSIS — I2510 Atherosclerotic heart disease of native coronary artery without angina pectoris: Secondary | ICD-10-CM

## 2023-08-21 DIAGNOSIS — N1831 Chronic kidney disease, stage 3a: Secondary | ICD-10-CM | POA: Diagnosis not present

## 2023-08-21 NOTE — Patient Instructions (Addendum)

## 2023-09-07 ENCOUNTER — Encounter (HOSPITAL_COMMUNITY): Payer: Self-pay

## 2023-09-07 ENCOUNTER — Emergency Department (HOSPITAL_COMMUNITY)

## 2023-09-07 ENCOUNTER — Inpatient Hospital Stay (HOSPITAL_COMMUNITY)
Admission: EM | Admit: 2023-09-07 | Discharge: 2023-09-10 | DRG: 641 | Disposition: A | Discharge status: Home / Self Care | Attending: Internal Medicine | Admitting: Internal Medicine

## 2023-09-07 ENCOUNTER — Other Ambulatory Visit: Payer: Self-pay

## 2023-09-07 DIAGNOSIS — D649 Anemia, unspecified: Secondary | ICD-10-CM | POA: Diagnosis present

## 2023-09-07 DIAGNOSIS — F0390 Unspecified dementia without behavioral disturbance: Secondary | ICD-10-CM | POA: Diagnosis present

## 2023-09-07 DIAGNOSIS — E119 Type 2 diabetes mellitus without complications: Secondary | ICD-10-CM

## 2023-09-07 DIAGNOSIS — H9192 Unspecified hearing loss, left ear: Secondary | ICD-10-CM | POA: Diagnosis present

## 2023-09-07 DIAGNOSIS — K219 Gastro-esophageal reflux disease without esophagitis: Secondary | ICD-10-CM | POA: Diagnosis present

## 2023-09-07 DIAGNOSIS — E785 Hyperlipidemia, unspecified: Secondary | ICD-10-CM | POA: Diagnosis present

## 2023-09-07 DIAGNOSIS — R55 Syncope and collapse: Secondary | ICD-10-CM | POA: Diagnosis present

## 2023-09-07 DIAGNOSIS — N1832 Chronic kidney disease, stage 3b: Secondary | ICD-10-CM | POA: Diagnosis present

## 2023-09-07 DIAGNOSIS — Z8673 Personal history of transient ischemic attack (TIA), and cerebral infarction without residual deficits: Secondary | ICD-10-CM

## 2023-09-07 DIAGNOSIS — Z7902 Long term (current) use of antithrombotics/antiplatelets: Secondary | ICD-10-CM

## 2023-09-07 DIAGNOSIS — N179 Acute kidney failure, unspecified: Principal | ICD-10-CM | POA: Diagnosis present

## 2023-09-07 DIAGNOSIS — I2510 Atherosclerotic heart disease of native coronary artery without angina pectoris: Secondary | ICD-10-CM | POA: Diagnosis present

## 2023-09-07 DIAGNOSIS — E875 Hyperkalemia: Secondary | ICD-10-CM | POA: Diagnosis present

## 2023-09-07 DIAGNOSIS — Z8546 Personal history of malignant neoplasm of prostate: Secondary | ICD-10-CM

## 2023-09-07 DIAGNOSIS — I252 Old myocardial infarction: Secondary | ICD-10-CM

## 2023-09-07 DIAGNOSIS — E860 Dehydration: Principal | ICD-10-CM | POA: Diagnosis present

## 2023-09-07 DIAGNOSIS — N189 Chronic kidney disease, unspecified: Secondary | ICD-10-CM | POA: Diagnosis present

## 2023-09-07 DIAGNOSIS — I10 Essential (primary) hypertension: Secondary | ICD-10-CM | POA: Diagnosis present

## 2023-09-07 DIAGNOSIS — E1122 Type 2 diabetes mellitus with diabetic chronic kidney disease: Secondary | ICD-10-CM | POA: Diagnosis present

## 2023-09-07 DIAGNOSIS — I129 Hypertensive chronic kidney disease with stage 1 through stage 4 chronic kidney disease, or unspecified chronic kidney disease: Secondary | ICD-10-CM | POA: Diagnosis present

## 2023-09-07 DIAGNOSIS — Z8249 Family history of ischemic heart disease and other diseases of the circulatory system: Secondary | ICD-10-CM

## 2023-09-07 DIAGNOSIS — Z8711 Personal history of peptic ulcer disease: Secondary | ICD-10-CM

## 2023-09-07 DIAGNOSIS — Z87891 Personal history of nicotine dependence: Secondary | ICD-10-CM

## 2023-09-07 LAB — CBC
HCT: 34.5 % — ABNORMAL LOW (ref 39.0–52.0)
Hemoglobin: 11 g/dL — ABNORMAL LOW (ref 13.0–17.0)
MCH: 30 pg (ref 26.0–34.0)
MCHC: 31.9 g/dL (ref 30.0–36.0)
MCV: 94 fL (ref 80.0–100.0)
Platelets: 180 10*3/uL (ref 150–400)
RBC: 3.67 MIL/uL — ABNORMAL LOW (ref 4.22–5.81)
RDW: 15.6 % — ABNORMAL HIGH (ref 11.5–15.5)
WBC: 9.8 10*3/uL (ref 4.0–10.5)
nRBC: 0 % (ref 0.0–0.2)

## 2023-09-07 LAB — COMPREHENSIVE METABOLIC PANEL WITH GFR
ALT: 14 U/L (ref 0–44)
AST: 20 U/L (ref 15–41)
Albumin: 4 g/dL (ref 3.5–5.0)
Alkaline Phosphatase: 87 U/L (ref 38–126)
Anion gap: 11 (ref 5–15)
BUN: 60 mg/dL — ABNORMAL HIGH (ref 8–23)
CO2: 20 mmol/L — ABNORMAL LOW (ref 22–32)
Calcium: 9.8 mg/dL (ref 8.9–10.3)
Chloride: 104 mmol/L (ref 98–111)
Creatinine, Ser: 3.04 mg/dL — ABNORMAL HIGH (ref 0.61–1.24)
GFR, Estimated: 20 mL/min — ABNORMAL LOW (ref >60)
Glucose, Bld: 122 mg/dL — ABNORMAL HIGH (ref 70–99)
Potassium: 5.8 mmol/L — ABNORMAL HIGH (ref 3.5–5.1)
Sodium: 135 mmol/L (ref 135–145)
Total Bilirubin: 0.5 mg/dL (ref 0.0–1.2)
Total Protein: 7.9 g/dL (ref 6.5–8.1)

## 2023-09-07 LAB — CBG MONITORING, ED: Glucose-Capillary: 151 mg/dL — ABNORMAL HIGH (ref 70–99)

## 2023-09-07 MED ORDER — SODIUM CHLORIDE 0.9 % IV BOLUS
500.0000 mL | Freq: Once | INTRAVENOUS | Status: AC
  Administered 2023-09-08: 500 mL via INTRAVENOUS

## 2023-09-07 NOTE — ED Provider Notes (Incomplete)
 [place] EMERGENCY DEPARTMENT AT [facility] Provider Note   CSN: [id number] Arrival date & time: 09/07/23  1714     Patient presents with: Loss of Consciousness   Ricky Rowland. is a 78 y.o. male.    Loss of Consciousness Patient is an 78 year old male presented the ED today with family for complaints of lethargy, noting that he has had multiple episodes of being found asleep in a chair today.  Medical history of NSTEMI, carotid stenosis, stroke, CKD, small bowel AVMs, bradycardia, QT prolongation.  Family reports that he has had increased memory loss and confusion over the last 3 months.  Noted to have been scheduled for an appointment for this memory loss.  States that they found him today out in a chair sitting in the sun for extended period asleep.  They then took him back inside and noticed that when she came back to check on him that he was asleep again in the chair with his head slumped back.  They do not know how long he was outside under the sun.  Denies fever, headache, vision changes, chest pain, shortness of breath, abdominal pain, nausea, vomiting, diarrhea, hematochezia, dysuria, hematuria, lower leg swelling, rashes.     Prior to Admission medications   Medication Sig Start Date End Date Taking? Authorizing Provider  acetaminophen  (TYLENOL ) 650 MG CR tablet Take 650 mg by mouth every 8 (eight) hours as needed for pain.    [provider]  allopurinol  (ZYLOPRIM ) 300 MG tablet Take 300 mg by mouth daily.    [provider]  clopidogrel  (PLAVIX ) 75 MG tablet Take 75 mg by mouth daily. 01/05/23   [provider]  docusate sodium  (COLACE) 100 MG capsule Take 2 capsules (200 mg total) by mouth daily as needed for mild constipation. 12/05/22   Singh, Prashant K, MD  ferrous sulfate 325 (65 FE) MG tablet Take 325 mg by mouth daily with breakfast.    [provider]  isosorbide  mononitrate (IMDUR ) 30 MG 24 hr tablet Take 1  tablet (30 mg total) by mouth daily. 06/26/23   Tolia, Sunit, DO  losartan  (COZAAR ) 100 MG tablet Take 1 tablet (100 mg total) by mouth daily. 07/30/23   Sabharwal, Ria, DO  Multiple Vitamins-Minerals (MULTIVITAMIN ADULT EXTRA C PO) Take 1 tablet by mouth daily. 07/24/22   [provider]  nitroGLYCERIN  (NITROSTAT ) 0.4 MG SL tablet Place 1 tablet (0.4 mg total) under the tongue every 5 (five) minutes as needed for chest pain (up to 3 doses). 06/26/23   Tolia, Sunit, DO  pantoprazole  (PROTONIX ) 40 MG tablet Take 1 tablet (40 mg total) by mouth 2 (two) times daily before a meal. 12/22/22 07/29/24  Laurence Locus, DO  polyethylene glycol powder (GLYCOLAX /MIRALAX ) 17 GM/SCOOP powder Take 1 capful (17 g) with water by mouth daily as needed for mild constipation. 12/05/22   Singh, Prashant K, MD  REPATHA SURECLICK 140 MG/ML SOAJ Inject 140 mg into the skin every 14 (fourteen) days.  06/07/17   [provider]  tolnaftate (TINACTIN) 1 % cream Apply 1 application topically daily as needed (jock itch).    [provider]    Allergies: Ezetimibe and Statins    Review of Systems  Cardiovascular:  Positive for syncope.  Neurological:  Positive for syncope.  All other systems reviewed and are negative.   Updated Vital Signs BP (!) 172/92 (BP Location: Right Arm)   Pulse 73   Temp 97.8 F (36.6 C) (Oral)  Resp (!) 24   SpO2 100%   Physical Exam Vitals and nursing note reviewed.  Constitutional:      General: He is not in acute distress.    Appearance: Normal appearance. He is not ill-appearing or diaphoretic.  HENT:     Head: Normocephalic and atraumatic.   Eyes:     General: No scleral icterus.       Right eye: No discharge.        Left eye: No discharge.     Extraocular Movements: Extraocular movements intact.     Conjunctiva/sclera: Conjunctivae normal.     Pupils: Pupils are equal, round, and reactive to light.    Cardiovascular:     Rate and Rhythm: Normal rate  and regular rhythm.     Pulses: Normal pulses.     Heart sounds: Murmur heard.     No friction rub. No gallop.  Pulmonary:     Effort: Pulmonary effort is normal. No respiratory distress.     Breath sounds: Normal breath sounds. No stridor. No wheezing, rhonchi or rales.  Abdominal:     General: Abdomen is flat.     Palpations: Abdomen is soft.     Tenderness: There is no abdominal tenderness.   Musculoskeletal:        General: No deformity.     Cervical back: Normal range of motion and neck supple. No rigidity or tenderness.     Right lower leg: No edema.     Left lower leg: No edema.   Skin:    General: Skin is warm and dry.   Neurological:     General: No focal deficit present.     Mental Status: He is alert. Mental status is at baseline.     Cranial Nerves: No cranial nerve deficit.     Sensory: No sensory deficit.     Motor: No weakness.     Coordination: Coordination normal.     Comments: Patient alert and oriented x 2 to person and event.  No facial asymmetry, no ataxia, no apraxia, no aphasia, normal sensation to both upper and lower extremities bilaterally, normal grip strength bilaterally, normal strength to both flexion and extension to both upper lower extremities 5+ bilaterally, no visual field deficits, no nystagmus.   Psychiatric:        Mood and Affect: Mood normal.     (all labs ordered are listed, but only abnormal results are displayed) Labs Reviewed  COMPREHENSIVE METABOLIC PANEL WITH GFR - Abnormal; Notable for the following components:      Result Value   Potassium 5.8 (*)    CO2 20 (*)    Glucose, Bld 122 (*)    BUN 60 (*)    Creatinine, Ser 3.04 (*)    GFR, Estimated 20 (*)    All other components within normal limits  CBC - Abnormal; Notable for the following components:   RBC 3.67 (*)    Hemoglobin 11.0 (*)    HCT 34.5 (*)    RDW 15.6 (*)    All other components within normal limits  CBG MONITORING, ED - Abnormal; Notable for the  following components:   Glucose-Capillary 151 (*)    All other components within normal limits  URINALYSIS, ROUTINE W REFLEX MICROSCOPIC    EKG: EKG Interpretation Date/Time:  Monday September 07 2023 17:28:17 EDT Ventricular Rate:  70 PR Interval:  160 QRS Duration:  108 QT Interval:  400 QTC Calculation: 432 R Axis:   -45  Text  Interpretation: Normal sinus rhythm Left anterior fascicular block Moderate voltage criteria for LVH, may be normal variant ( R in aVL , Cornell product ) Abnormal ECG No significant change since last tracing Confirmed by Jerrol Agent ([id number]) on 09/07/2023 11:28:00 PM  Radiology: No results found.  {Document cardiac monitor, telemetry assessment procedure when appropriate:32947} Procedures   Medications Ordered in the ED - No data to display    {Click here for ABCD2, HEART and other calculators REFRESH Note before signing:1}                              Medical Decision Making Amount and/or Complexity of Data Reviewed Labs: ordered.   This patient is a ***  who presents to the ED for concern of ***.   Differential diagnoses prior to evaluation: The emergent differential diagnosis includes, but is not limited to,  *** . This is not an exhaustive differential.   Past Medical History / Co-morbidities / Social History: HTN, carotid stenosis, NSTEMI, QT prolongation, CAD, pulmonary hypertension, AVM of small bowel, CKD, heart murmur, syncope and collapse, bradycardia, stroke  Additional history: Chart reviewed. Pertinent results include:  Last seen by cardiology on 08/21/2023 noted to be on Plavix  at that time.  Noted to have a normal EF by TTE on 12/24.  Continue to have melenic stools from small AVMs in small bowel.   Lab Tests/Imaging studies: I personally interpreted labs/imaging and the pertinent results include:    CBC noted to have a  anemia with hemoglobin of 11.0 increased from 4 months ago which was 9.3 CMP notes a hyperkalemia 5.8,  decreased CO2 of 20, increased BUN of 60 and elevated creatinine 3.04 and decreased GFR of 20   ***I agree with the radiologist interpretation.  Cardiac monitoring: EKG obtained and interpreted by myself and attending physician which shows: ***   Medications: I ordered medication including ***.  I have reviewed the patients home medicines and have made adjustments as needed.  Critical Interventions:  Social Determinants of Health:  Disposition: After consideration of the diagnostic results and the patients response to treatment, I feel that the patient would benefit from ***.   ***emergency department workup does not suggest an emergent condition requiring admission or immediate intervention beyond what has been performed at this time. The plan is: ***. The patient is safe for discharge and has been instructed to return immediately for worsening symptoms, change in symptoms or any other concerns.   {Document critical care time when appropriate  Document review of labs and clinical decision tools ie CHADS2VASC2, etc  Document your independent review of radiology images and any outside records  Document your discussion with family members, caretakers and with consultants  Document social determinants of health affecting pt's care  Document your decision making why or why not admission, treatments were needed:32947:::1}   Final diagnoses:  None    ED Discharge Orders     None

## 2023-09-07 NOTE — ED Triage Notes (Signed)
 Patient BIB [facility] from home for a near syncopal episode while eating, patient wore heart monitor to diagnose same with no results. Patient BP palpated at 80 by fire, EMS was 130/80 5 mins later. EKG unremarkable per EMS, patient has no complaints, family wanted him to be evaluated.  BP 124/56 HR 68 98% RA CBG 108 RR 18

## 2023-09-07 NOTE — ED Provider Notes (Signed)
 [place] EMERGENCY DEPARTMENT AT [facility] Provider Note   CSN: [id number] Arrival date & time: 09/07/23  1714     Patient presents with: Loss of Consciousness   Ricky Rowland. is a 78 y.o. male.    Loss of Consciousness Patient is an 78 year old male presented the ED today with family for complaints of lethargy, noting that he has had multiple episodes of being found asleep in a chair today.  Medical history of NSTEMI, carotid stenosis, stroke, CKD, small bowel AVMs, bradycardia, QT prolongation.  Family reports that he has had increased memory loss and confusion over the last 3 months.  Noted to have been scheduled for an appointment for this memory loss.  States that they found him today out in a chair sitting in the sun for extended period asleep.  They then took him back inside and noticed that when she came back to check on him that he was asleep again in the chair with his head slumped back.  They do not know how long he was outside under the sun.  Denies fever, headache, vision changes, chest pain, shortness of breath, abdominal pain, nausea, vomiting, diarrhea, hematochezia, dysuria, hematuria, lower leg swelling, rashes.     Prior to Admission medications   Medication Sig Start Date End Date Taking? Authorizing Provider  acetaminophen  (TYLENOL ) 650 MG CR tablet Take 650 mg by mouth every 8 (eight) hours as needed for pain.    [provider]  allopurinol  (ZYLOPRIM ) 300 MG tablet Take 300 mg by mouth daily.    [provider]  clopidogrel  (PLAVIX ) 75 MG tablet Take 75 mg by mouth daily. 01/05/23   [provider]  docusate sodium  (COLACE) 100 MG capsule Take 2 capsules (200 mg total) by mouth daily as needed for mild constipation. 12/05/22   Singh, Prashant K, MD  ferrous sulfate 325 (65 FE) MG tablet Take 325 mg by mouth daily with breakfast.    [provider]  isosorbide  mononitrate (IMDUR ) 30 MG 24 hr tablet Take 1  tablet (30 mg total) by mouth daily. 06/26/23   Tolia, Sunit, DO  losartan  (COZAAR ) 100 MG tablet Take 1 tablet (100 mg total) by mouth daily. 07/30/23   Sabharwal, Ria, DO  Multiple Vitamins-Minerals (MULTIVITAMIN ADULT EXTRA C PO) Take 1 tablet by mouth daily. 07/24/22   [provider]  nitroGLYCERIN  (NITROSTAT ) 0.4 MG SL tablet Place 1 tablet (0.4 mg total) under the tongue every 5 (five) minutes as needed for chest pain (up to 3 doses). 06/26/23   Tolia, Sunit, DO  pantoprazole  (PROTONIX ) 40 MG tablet Take 1 tablet (40 mg total) by mouth 2 (two) times daily before a meal. 12/22/22 07/29/24  Laurence Locus, DO  polyethylene glycol powder (GLYCOLAX /MIRALAX ) 17 GM/SCOOP powder Take 1 capful (17 g) with water by mouth daily as needed for mild constipation. 12/05/22   Singh, Prashant K, MD  REPATHA SURECLICK 140 MG/ML SOAJ Inject 140 mg into the skin every 14 (fourteen) days.  06/07/17   [provider]  tolnaftate (TINACTIN) 1 % cream Apply 1 application topically daily as needed (jock itch).    [provider]    Allergies: Ezetimibe and Statins    Review of Systems  Cardiovascular:  Positive for syncope.  Neurological:  Positive for syncope.  All other systems reviewed and are negative.   Updated Vital Signs BP (!) 154/89 (BP Location: Left Arm)   Pulse 68   Temp 97.8 F (36.6 C) (Oral)  Resp 15   SpO2 100%   Physical Exam Vitals and nursing note reviewed.  Constitutional:      General: He is not in acute distress.    Appearance: Normal appearance. He is not ill-appearing or diaphoretic.  HENT:     Head: Normocephalic and atraumatic.   Eyes:     General: No scleral icterus.       Right eye: No discharge.        Left eye: No discharge.     Extraocular Movements: Extraocular movements intact.     Conjunctiva/sclera: Conjunctivae normal.     Pupils: Pupils are equal, round, and reactive to light.    Cardiovascular:     Rate and Rhythm: Normal rate and  regular rhythm.     Pulses: Normal pulses.     Heart sounds: Murmur heard.     No friction rub. No gallop.  Pulmonary:     Effort: Pulmonary effort is normal. No respiratory distress.     Breath sounds: Normal breath sounds. No stridor. No wheezing, rhonchi or rales.  Abdominal:     General: Abdomen is flat. There is no distension.     Palpations: Abdomen is soft.     Tenderness: There is no abdominal tenderness. There is no right CVA tenderness, left CVA tenderness or guarding.   Musculoskeletal:        General: No deformity.     Cervical back: Normal range of motion and neck supple. No rigidity or tenderness.     Right lower leg: No edema.     Left lower leg: No edema.   Skin:    General: Skin is warm and dry.     Findings: No bruising or erythema.   Neurological:     General: No focal deficit present.     Mental Status: He is alert. Mental status is at baseline.     Cranial Nerves: No cranial nerve deficit.     Sensory: No sensory deficit.     Motor: No weakness.     Coordination: Coordination normal.     Comments: Patient alert and oriented x 2 to person and event.  No facial asymmetry, no ataxia, no apraxia, no aphasia, normal sensation to both upper and lower extremities bilaterally, normal grip strength bilaterally, normal strength to both flexion and extension to both upper lower extremities 5+ bilaterally, no visual field deficits, no nystagmus.   Psychiatric:        Mood and Affect: Mood normal.     (all labs ordered are listed, but only abnormal results are displayed) Labs Reviewed  COMPREHENSIVE METABOLIC PANEL WITH GFR - Abnormal; Notable for the following components:      Result Value   Potassium 5.8 (*)    CO2 20 (*)    Glucose, Bld 122 (*)    BUN 60 (*)    Creatinine, Ser 3.04 (*)    GFR, Estimated 20 (*)    All other components within normal limits  CBC - Abnormal; Notable for the following components:   RBC 3.67 (*)    Hemoglobin 11.0 (*)    HCT  34.5 (*)    RDW 15.6 (*)    All other components within normal limits  URINALYSIS, ROUTINE W REFLEX MICROSCOPIC - Abnormal; Notable for the following components:   Protein, ur >=300 (*)    All other components within normal limits  CBG MONITORING, ED - Abnormal; Notable for the following components:   Glucose-Capillary 151 (*)    All  other components within normal limits  CK  TROPONIN I (HIGH SENSITIVITY)  TROPONIN I (HIGH SENSITIVITY)    EKG: EKG Interpretation Date/Time:  Monday September 07 2023 17:28:17 EDT Ventricular Rate:  70 PR Interval:  160 QRS Duration:  108 QT Interval:  400 QTC Calculation: 432 R Axis:   -45  Text Interpretation: Normal sinus rhythm Left anterior fascicular block Moderate voltage criteria for LVH, may be normal variant ( R in aVL , Cornell product ) Abnormal ECG No significant change since last tracing Confirmed by Jerrol Agent ([id number]) on 09/07/2023 11:28:00 PM  Radiology: CT Head Wo Contrast Result Date: 09/08/2023 CLINICAL DATA:  Syncope/presyncope, cerebrovascular cause suspected EXAM: CT HEAD WITHOUT CONTRAST TECHNIQUE: Contiguous axial images were obtained from the base of the skull through the vertex without intravenous contrast. RADIATION DOSE REDUCTION: This exam was performed according to the departmental dose-optimization program which includes automated exposure control, adjustment of the mA and/or kV according to patient size and/or use of iterative reconstruction technique. COMPARISON:  MRI head 02/24/2023.  CT head 02/22/2023. FINDINGS: Brain: No evidence of acute infarction, hemorrhage, hydrocephalus, extra-axial collection or mass lesion/mass effect. Similar remote right basal ganglia infarct. Mildly motion limited study. Vascular: No hyperdense vessel.  Calcific atherosclerosis. Skull: No acute fracture. Sinuses/Orbits: Clear sinuses. Remote left medial orbital wall fracture. Other: No mastoid effusions. IMPRESSION: No evidence of acute intracranial  abnormality. Electronically Signed   By: Gilmore GORMAN Molt M.D.   On: 09/08/2023 00:42   DG Chest 2 View Result Date: 09/08/2023 CLINICAL DATA:  Near syncope. EXAM: CHEST - 2 VIEW COMPARISON:  February 22, 2023 FINDINGS: The heart size and mediastinal contours are within normal limits. There is no evidence of an acute infiltrate, pleural effusion or pneumothorax. Multilevel degenerative changes are seen throughout the thoracic spine. IMPRESSION: No active cardiopulmonary disease. Electronically Signed   By: Suzen Dials M.D.   On: 09/08/2023 00:28    Procedures   Medications Ordered in the ED  sodium chloride  0.9 % bolus 500 mL (0 mLs Intravenous Stopped 09/08/23 0035)  sodium zirconium cyclosilicate (LOKELMA) packet 10 g (10 g Oral Given 09/08/23 0041)  0.9 %  sodium chloride  infusion ( Intravenous New Bag/Given 09/08/23 0300)                                 Medical Decision Making Amount and/or Complexity of Data Reviewed Labs: ordered. Radiology: ordered.  Risk Prescription drug management. Decision regarding hospitalization.   This patient is a 78 year old male who presents to the ED for concern of lethargy versus syncope, noted to have been picked up by family who had noticed that he had syncopal episode sitting in his chair outside for an unknown period of time watching his dog in the yard, also noting that he was witnessed to have single episode in his chair when they brought him inside.  Noted to have had increased confusion over the last 3 months, noted to have a follow-up with PCP scheduled.   On physical exam, patient is in no acute distress, afebrile, alert and orient x 2 to person and event, speaking in full sentences, nontachypneic, nontachycardic.  Abdomen is nontender, LCTAB.  No CVA tenderness.  Unremarkable physical exam otherwise.  Labs did note AKI, hyperkalemia.  CT of head was unremarkable as well as chest x-ray unremarkable.  However with patient's current  condition, provided with, as well as saline bolus.  Troponin and CK were  also unremarkable.  Looked to admit to hospital for hyperkalemia with AKI, noted that his creatinine had significantly increased from 4 months ago.  Suspect likely dehydration as cause.  Will seek to admit.  Spoke to [facility] hospitalist, patient care was transferred over to them at this time.  Differential diagnoses prior to evaluation: The emergent differential diagnosis includes, but is not limited to, AKI, sick sinus syndrome, arrhythmia, encephalopathy, PE, pneumonia, pneumothorax, metabolic disturbance. This is not an exhaustive differential.   Past Medical History / Co-morbidities / Social History: HTN, carotid stenosis, NSTEMI, QT prolongation, CAD, pulmonary hypertension, AVM of small bowel, CKD, heart murmur, syncope and collapse, bradycardia, stroke  Additional history: Chart reviewed. Pertinent results include:  Last seen by cardiology on 08/21/2023 noted to be on Plavix  at that time.  Noted to have a normal EF by TTE on 12/24.  Continue to have melenic stools from small AVMs in small bowel.   Lab Tests/Imaging studies: I personally interpreted labs/imaging and the pertinent results include:    CBC noted to have a  anemia with hemoglobin of 11.0 increased from 4 months ago which was 9.3 CMP notes a hyperkalemia 5.8, decreased CO2 of 20, increased BUN of 60 and elevated creatinine 3.04 and decreased GFR of 20 UA shows protein Troponin unremarkable CK unremarkable CT head shows no acute findings Chest x-ray unremarkable  I agree with the radiologist interpretation.  Cardiac monitoring: EKG obtained and interpreted by myself and attending physician which shows: NSR with bifascicular block  EKG Interpretation Date/Time:  Monday September 07 2023 17:28:17 EDT Ventricular Rate:  70 PR Interval:  160 QRS Duration:  108 QT Interval:  400 QTC Calculation: 432 R Axis:   -45  Text Interpretation: Normal sinus  rhythm Left anterior fascicular block Moderate voltage criteria for LVH, may be normal variant ( R in aVL , Cornell product ) Abnormal ECG No significant change since last tracing Confirmed by Jerrol Agent ([id number]) on 09/07/2023 11:28:00 PM          Medications: I ordered medication including Lokelma, NS.  I have reviewed the patients home medicines and have made adjustments as needed.  Critical Interventions: None  Social Determinants of Health: Notably alert and oriented x 2 to person and event, with increasing memory issues  Disposition: After consideration of the diagnostic results and the patients response to treatment, I feel that the patient would benefit from admission, patient care transferred to [facility] hospitalist   Final diagnoses:  AKI (acute kidney injury) [facility])  Syncope and collapse  Hyperkalemia    ED Discharge Orders     None          Beola Terrall RAMAN, [place] 09/08/23 0345    Jerrol Agent, MD 09/08/23 [phone]

## 2023-09-07 NOTE — ED Notes (Signed)
 Family states that patient sits outside in the heat and gets overheated. Family states that today, patient fell asleep outside and the blackout episode happened after he was brought back in to cool down and eat. They state this is the pattern related to these episodes every time.

## 2023-09-08 ENCOUNTER — Emergency Department (HOSPITAL_COMMUNITY)

## 2023-09-08 ENCOUNTER — Other Ambulatory Visit: Payer: Self-pay

## 2023-09-08 ENCOUNTER — Encounter (HOSPITAL_COMMUNITY): Payer: Self-pay

## 2023-09-08 DIAGNOSIS — K219 Gastro-esophageal reflux disease without esophagitis: Secondary | ICD-10-CM

## 2023-09-08 DIAGNOSIS — E1122 Type 2 diabetes mellitus with diabetic chronic kidney disease: Secondary | ICD-10-CM | POA: Diagnosis present

## 2023-09-08 DIAGNOSIS — E785 Hyperlipidemia, unspecified: Secondary | ICD-10-CM | POA: Diagnosis present

## 2023-09-08 DIAGNOSIS — R55 Syncope and collapse: Secondary | ICD-10-CM

## 2023-09-08 DIAGNOSIS — N179 Acute kidney failure, unspecified: Secondary | ICD-10-CM

## 2023-09-08 DIAGNOSIS — I10 Essential (primary) hypertension: Secondary | ICD-10-CM

## 2023-09-08 DIAGNOSIS — D649 Anemia, unspecified: Secondary | ICD-10-CM | POA: Diagnosis present

## 2023-09-08 DIAGNOSIS — Z7902 Long term (current) use of antithrombotics/antiplatelets: Secondary | ICD-10-CM | POA: Diagnosis not present

## 2023-09-08 DIAGNOSIS — Z8249 Family history of ischemic heart disease and other diseases of the circulatory system: Secondary | ICD-10-CM | POA: Diagnosis not present

## 2023-09-08 DIAGNOSIS — Z87891 Personal history of nicotine dependence: Secondary | ICD-10-CM | POA: Diagnosis not present

## 2023-09-08 DIAGNOSIS — H9192 Unspecified hearing loss, left ear: Secondary | ICD-10-CM | POA: Diagnosis present

## 2023-09-08 DIAGNOSIS — I252 Old myocardial infarction: Secondary | ICD-10-CM | POA: Diagnosis not present

## 2023-09-08 DIAGNOSIS — N1832 Chronic kidney disease, stage 3b: Secondary | ICD-10-CM | POA: Diagnosis present

## 2023-09-08 DIAGNOSIS — I129 Hypertensive chronic kidney disease with stage 1 through stage 4 chronic kidney disease, or unspecified chronic kidney disease: Secondary | ICD-10-CM | POA: Diagnosis present

## 2023-09-08 DIAGNOSIS — Z8711 Personal history of peptic ulcer disease: Secondary | ICD-10-CM | POA: Diagnosis not present

## 2023-09-08 DIAGNOSIS — E860 Dehydration: Secondary | ICD-10-CM | POA: Diagnosis present

## 2023-09-08 DIAGNOSIS — E875 Hyperkalemia: Secondary | ICD-10-CM

## 2023-09-08 DIAGNOSIS — F0390 Unspecified dementia without behavioral disturbance: Secondary | ICD-10-CM | POA: Diagnosis present

## 2023-09-08 DIAGNOSIS — Z8673 Personal history of transient ischemic attack (TIA), and cerebral infarction without residual deficits: Secondary | ICD-10-CM | POA: Diagnosis not present

## 2023-09-08 DIAGNOSIS — I2510 Atherosclerotic heart disease of native coronary artery without angina pectoris: Secondary | ICD-10-CM | POA: Diagnosis present

## 2023-09-08 DIAGNOSIS — E119 Type 2 diabetes mellitus without complications: Secondary | ICD-10-CM | POA: Diagnosis not present

## 2023-09-08 DIAGNOSIS — Z8546 Personal history of malignant neoplasm of prostate: Secondary | ICD-10-CM | POA: Diagnosis not present

## 2023-09-08 DIAGNOSIS — N189 Chronic kidney disease, unspecified: Secondary | ICD-10-CM

## 2023-09-08 LAB — CBC WITH DIFFERENTIAL/PLATELET
Abs Immature Granulocytes: 0.03 10*3/uL (ref 0.00–0.07)
Basophils Absolute: 0 10*3/uL (ref 0.0–0.1)
Basophils Relative: 0 %
Eosinophils Absolute: 0.1 10*3/uL (ref 0.0–0.5)
Eosinophils Relative: 1 %
HCT: 32.4 % — ABNORMAL LOW (ref 39.0–52.0)
Hemoglobin: 10.5 g/dL — ABNORMAL LOW (ref 13.0–17.0)
Immature Granulocytes: 0 %
Lymphocytes Relative: 23 %
Lymphs Abs: 1.9 10*3/uL (ref 0.7–4.0)
MCH: 29.7 pg (ref 26.0–34.0)
MCHC: 32.4 g/dL (ref 30.0–36.0)
MCV: 91.8 fL (ref 80.0–100.0)
Monocytes Absolute: 0.6 10*3/uL (ref 0.1–1.0)
Monocytes Relative: 8 %
Neutro Abs: 5.7 10*3/uL (ref 1.7–7.7)
Neutrophils Relative %: 68 %
Platelets: 168 10*3/uL (ref 150–400)
RBC: 3.53 MIL/uL — ABNORMAL LOW (ref 4.22–5.81)
RDW: 15.4 % (ref 11.5–15.5)
WBC: 8.3 10*3/uL (ref 4.0–10.5)
nRBC: 0 % (ref 0.0–0.2)

## 2023-09-08 LAB — URINALYSIS, ROUTINE W REFLEX MICROSCOPIC
Bacteria, UA: NONE SEEN
Bilirubin Urine: NEGATIVE
Glucose, UA: NEGATIVE mg/dL
Hgb urine dipstick: NEGATIVE
Ketones, ur: NEGATIVE mg/dL
Leukocytes,Ua: NEGATIVE
Nitrite: NEGATIVE
Protein, ur: >=300 mg/dL — AB
Specific Gravity, Urine: 1.017 (ref 1.005–1.030)
pH: 5 (ref 5.0–8.0)

## 2023-09-08 LAB — BASIC METABOLIC PANEL WITH GFR
Anion gap: 10 (ref 5–15)
BUN: 48 mg/dL — ABNORMAL HIGH (ref 8–23)
CO2: 20 mmol/L — ABNORMAL LOW (ref 22–32)
Calcium: 9.4 mg/dL (ref 8.9–10.3)
Chloride: 107 mmol/L (ref 98–111)
Creatinine, Ser: 2.19 mg/dL — ABNORMAL HIGH (ref 0.61–1.24)
GFR, Estimated: 30 mL/min — ABNORMAL LOW (ref >60)
Glucose, Bld: 120 mg/dL — ABNORMAL HIGH (ref 70–99)
Potassium: 4.2 mmol/L (ref 3.5–5.1)
Sodium: 137 mmol/L (ref 135–145)

## 2023-09-08 LAB — TROPONIN I (HIGH SENSITIVITY)
Troponin I (High Sensitivity): 8 ng/L (ref <18)
Troponin I (High Sensitivity): 9 ng/L (ref <18)

## 2023-09-08 LAB — CK: Total CK: 58 U/L (ref 49–397)

## 2023-09-08 MED ORDER — HEPARIN SODIUM (PORCINE) 5000 UNIT/ML IJ SOLN
5000.0000 [IU] | Freq: Three times a day (TID) | INTRAMUSCULAR | Status: DC
  Administered 2023-09-08 – 2023-09-10 (×6): 5000 [IU] via SUBCUTANEOUS
  Filled 2023-09-08 (×6): qty 1

## 2023-09-08 MED ORDER — NAPHAZOLINE-GLYCERIN 0.012-0.25 % OP SOLN: 1.0000 [drp] | Freq: Four times a day (QID) | OPHTHALMIC | Status: DC | PRN

## 2023-09-08 MED ORDER — CLEAR EYES COMPLETE OP SOLN: Freq: Every day | OPHTHALMIC | Status: DC | PRN

## 2023-09-08 MED ORDER — SODIUM CHLORIDE 0.9 % IV BOLUS: 1000.0000 mL | Freq: Once | INTRAVENOUS | Status: DC

## 2023-09-08 MED ORDER — ACETAMINOPHEN 325 MG PO TABS
650.0000 mg | ORAL_TABLET | Freq: Four times a day (QID) | ORAL | Status: DC | PRN
  Administered 2023-09-08 – 2023-09-09 (×4): 650 mg via ORAL
  Filled 2023-09-08 (×4): qty 2

## 2023-09-08 MED ORDER — PANTOPRAZOLE SODIUM 40 MG PO TBEC
40.0000 mg | DELAYED_RELEASE_TABLET | Freq: Two times a day (BID) | ORAL | Status: DC
  Administered 2023-09-08 – 2023-09-10 (×4): 40 mg via ORAL
  Filled 2023-09-08 (×4): qty 1

## 2023-09-08 MED ORDER — CLOPIDOGREL BISULFATE 75 MG PO TABS
75.0000 mg | ORAL_TABLET | Freq: Every day | ORAL | Status: DC
  Administered 2023-09-08 – 2023-09-10 (×3): 75 mg via ORAL
  Filled 2023-09-08 (×3): qty 1

## 2023-09-08 MED ORDER — ACETAMINOPHEN 650 MG RE SUPP: 650.0000 mg | Freq: Four times a day (QID) | RECTAL | Status: DC | PRN

## 2023-09-08 MED ORDER — SODIUM CHLORIDE 0.9% FLUSH
3.0000 mL | Freq: Two times a day (BID) | INTRAVENOUS | Status: DC
  Administered 2023-09-08 – 2023-09-10 (×4): 3 mL via INTRAVENOUS

## 2023-09-08 MED ORDER — SODIUM ZIRCONIUM CYCLOSILICATE 10 G PO PACK
10.0000 g | PACK | Freq: Once | ORAL | Status: AC
  Administered 2023-09-08: 10 g via ORAL
  Filled 2023-09-08: qty 1

## 2023-09-08 MED ORDER — DOCUSATE SODIUM 100 MG PO CAPS
200.0000 mg | ORAL_CAPSULE | Freq: Every day | ORAL | Status: DC | PRN
  Administered 2023-09-08 – 2023-09-09 (×2): 200 mg via ORAL
  Filled 2023-09-08 (×2): qty 2

## 2023-09-08 MED ORDER — ALBUTEROL SULFATE (2.5 MG/3ML) 0.083% IN NEBU: 2.5000 mg | INHALATION_SOLUTION | Freq: Four times a day (QID) | RESPIRATORY_TRACT | Status: DC | PRN

## 2023-09-08 MED ORDER — FERROUS SULFATE 325 (65 FE) MG PO TABS
325.0000 mg | ORAL_TABLET | Freq: Every day | ORAL | Status: DC
  Administered 2023-09-09 – 2023-09-10 (×2): 325 mg via ORAL
  Filled 2023-09-08 (×2): qty 1

## 2023-09-08 MED ORDER — SODIUM CHLORIDE 0.9 % IV SOLN: Freq: Once | INTRAVENOUS | Status: DC

## 2023-09-08 MED ORDER — SODIUM CHLORIDE 0.9 % IV SOLN: Freq: Once | INTRAVENOUS | Status: AC

## 2023-09-08 MED ORDER — SODIUM CHLORIDE 0.9 % IV SOLN: INTRAVENOUS | Status: AC

## 2023-09-08 MED ORDER — ALLOPURINOL 300 MG PO TABS
300.0000 mg | ORAL_TABLET | Freq: Every day | ORAL | Status: DC
  Administered 2023-09-08 – 2023-09-10 (×3): 300 mg via ORAL
  Filled 2023-09-08: qty 1
  Filled 2023-09-08: qty 3
  Filled 2023-09-08: qty 1

## 2023-09-08 MED ORDER — ISOSORBIDE MONONITRATE ER 30 MG PO TB24
30.0000 mg | ORAL_TABLET | Freq: Every evening | ORAL | Status: DC
  Administered 2023-09-08 – 2023-09-09 (×2): 30 mg via ORAL
  Filled 2023-09-08 (×2): qty 1

## 2023-09-08 MED ORDER — TOLNAFTATE 1 % EX CREA: 1.0000 | TOPICAL_CREAM | Freq: Every day | CUTANEOUS | Status: DC | PRN

## 2023-09-08 MED ORDER — HYDRALAZINE HCL 20 MG/ML IJ SOLN
10.0000 mg | INTRAMUSCULAR | Status: DC | PRN
  Administered 2023-09-08: 10 mg via INTRAVENOUS
  Filled 2023-09-08: qty 1

## 2023-09-08 NOTE — ED Notes (Signed)
 Repositioned patient for comfort. Family reporting patient has cancer and is not going to tell you he is in pain.

## 2023-09-08 NOTE — H&P (Signed)
 History and Physical    Patient: Ricky Rowland FMW:[id number] DOB: 06/21/1945 DOA: 09/07/2023 DOS: the patient was seen and examined on 09/08/2023 PCP: Rexanne Ingle, MD  Patient coming from: Via EMS  Chief Complaint:  Chief Complaint  Patient presents with   Loss of Consciousness   HPI: Ricky Rowland. is a 78 y.o. male with medical history significant of hypertension, hyperlipidemia, diet-controlled diabetes mellitus type 2, CVA, recurrent syncope, prostate cancer, remote gastric ulcer, and GERD presents after suspected loss of consciousness. He is accompanied by his son who helps provide history.  He was outside in the sun when temperatures were at least 90 degrees for an unknown duration while letting the dog out. He was found by his daughter slumped over in a chair. Upon being brought inside, he was cooled down, and after a brief period of unresponsiveness, he regained consciousness. His son estimates he was unresponsive for a few seconds to a couple of minutes before the ambulance arrived.  He reports recent shortness of breath, which has not worsened since receiving fluids. No current pain, although his son mentions he has been experiencing kidney pain. His kidney function was previously reported as normal at an appointment within the last three weeks, but recent labs showed decreased function after this episode.  He has been urinating, albeit in small amounts, and confirms urination since the previous day. He typically gets around without assistance but occasionally uses a cane. His sister and mother are usually present to assist him if needed.  His memory has been described as 'coming and going,' and he was scheduled for a doctor's appointment to address this issue. There is no history of smoking or alcohol use mentioned.  In the emergency department patient was noted to be afebrile with mild tachypnea, and blood pressures elevated up to 172/92.  Labs from 6/30  significant for potassium 5.8, CO2 20, BUN 60, creatinine 3.04, anion gap 11, CK 58, and high-sensitivity troponins negative x 2.  Chest x-ray negative as well as CT scan of the head without contrast did not reveal any acute abnormality.  Urinalysis for greater than 300 protein, but did not show any signs for infection.  Patient had been given 500 mL bolus of IV fluids and Lokelma 10 g p.o.  Review of Systems: As mentioned in the history of present illness. All other systems reviewed and are negative. Past Medical History:  Diagnosis Date   Acute kidney injury superimposed on chronic kidney disease (HCC) 11/19/2022   Borderline diabetes    DIET CONTROLLED   Bradycardia 11/17/2022   Dyslipidemia    Elevated PSA    External carotid artery stenosis    MILD-- BILATERAL   Fall 11/05/2017   GERD (gastroesophageal reflux disease)    Gout, arthritis    Hearing loss in left ear    Heart murmur    MILD --  ASYMPTOMATIC   History of gastric ulcer    REMOTE HX   Hypertension    Pre-diabetes    Prostate cancer (HCC) 12/21/2012   Gleason 3+4=7   Seizure-like activity (HCC)    Stroke (HCC)    Syncope and collapse    Wears glasses    Past Surgical History:  Procedure Laterality Date   CORONARY STENT INTERVENTION N/A 11/16/2022   Procedure: CORONARY STENT INTERVENTION;  Surgeon: Elmira Newman JINNY, MD;  Location: [facility];  Service: Cardiovascular;  Laterality: N/A;   CORONARY ULTRASOUND/IVUS N/A 11/16/2022   Procedure: Coronary Ultrasound/IVUS;  Surgeon: Elmira Newman PARAS, MD;  Location: [facility];  Service: Cardiovascular;  Laterality: N/A;   ENTEROSCOPY N/A 12/21/2022   Procedure: ENTEROSCOPY;  Surgeon: Rosalie Kitchens, MD;  Location: [facility] ENDOSCOPY;  Service: Gastroenterology;  Laterality: N/A;   ESOPHAGOGASTRODUODENOSCOPY (EGD) WITH PROPOFOL  N/A 12/02/2022   Procedure: ESOPHAGOGASTRODUODENOSCOPY (EGD) WITH PROPOFOL ;  Surgeon: Dianna Specking, MD;  Location: [facility] [place]  ENDOSCOPY;  Service: Gastroenterology;  Laterality: N/A;   ESOPHAGOGASTRODUODENOSCOPY (EGD) WITH PROPOFOL  N/A 12/18/2022   Procedure: ESOPHAGOGASTRODUODENOSCOPY (EGD) WITH PROPOFOL ;  Surgeon: Saintclair Jasper, MD;  Location: [facility] ENDOSCOPY;  Service: Gastroenterology;  Laterality: N/A;   GIVENS CAPSULE STUDY N/A 12/18/2022   Procedure: GIVENS CAPSULE STUDY;  Surgeon: Saintclair Jasper, MD;  Location: [facility] ENDOSCOPY;  Service: Gastroenterology;  Laterality: N/A;   HOT HEMOSTASIS N/A 12/21/2022   Procedure: HOT HEMOSTASIS (ARGON PLASMA COAGULATION/BICAP);  Surgeon: Rosalie Kitchens, MD;  Location: [facility] ENDOSCOPY;  Service: Gastroenterology;  Laterality: N/A;  Straight Fire Probe   KNEE ARTHROSCOPY W/ MENISCAL REPAIR Left 03/10/2012   LEFT HEART CATH AND CORONARY ANGIOGRAPHY N/A 11/16/2022   Procedure: LEFT HEART CATH AND CORONARY ANGIOGRAPHY;  Surgeon: Elmira Newman PARAS, MD;  Location: [facility];  Service: Cardiovascular;  Laterality: N/A;   LOOP RECORDER INSERTION N/A 01/05/2018   Procedure: LOOP RECORDER INSERTION;  Surgeon: Fernande Elspeth BROCKS, MD;  Location: [facility] INVASIVE CV LAB;  Service: Cardiovascular;  Laterality: N/A;   LUMBAR SPINE SURGERY  03/10/1989   PROSTATE BIOPSY N/A 12/21/2012   Procedure: BIOPSY TRANSRECTAL ULTRASONIC PROSTATE (TUBP);  Surgeon: Thomasine Oiler, MD;  Location: [facility];  Service: Urology;  Laterality: N/A;   PROSTATE BIOPSY  11/26/2011   benign   TEMPORARY PACEMAKER N/A 11/16/2022   Procedure: TEMPORARY PACEMAKER;  Surgeon: Elmira Newman PARAS, MD;  Location: [facility];  Service: Cardiovascular;  Laterality: N/A;   TONSILLECTOMY     WISDOM TOOTH EXTRACTION     Social History:  reports that he quit smoking about 25 years ago. His smoking use included cigarettes. He started smoking about 55 years ago. He has a 30 pack-year smoking history. He has never used smokeless tobacco. He reports that he does not currently use alcohol after a past usage of about  20.0 standard drinks of alcohol per week. He reports that he does not use drugs.  Allergies  Allergen Reactions   Ezetimibe Other (See Comments)    Muscle aches   Statins Other (See Comments)    Joint pain    Family History  Problem Relation Age of Onset   Heart disease Mother    Heart disease Father    Cancer Sister        lung   Cancer Brother        prostate   Syncope episode Neg Hx     Prior to Admission medications   Medication Sig Start Date End Date Taking? Authorizing Provider  acetaminophen  (TYLENOL ) 650 MG CR tablet Take 650 mg by mouth every 8 (eight) hours as needed for pain.    [provider]  allopurinol  (ZYLOPRIM ) 300 MG tablet Take 300 mg by mouth daily.    [provider]  clopidogrel  (PLAVIX ) 75 MG tablet Take 75 mg by mouth daily. 01/05/23   [provider]  docusate sodium  (COLACE) 100 MG capsule Take 2 capsules (200 mg total) by mouth daily as needed for mild constipation. 12/05/22   Singh, Prashant K, MD  ferrous sulfate 325 (65 FE) MG tablet Take 325 mg by mouth  daily with breakfast.    [provider]  isosorbide  mononitrate (IMDUR ) 30 MG 24 hr tablet Take 1 tablet (30 mg total) by mouth daily. 06/26/23   Tolia, Sunit, DO  losartan  (COZAAR ) 100 MG tablet Take 1 tablet (100 mg total) by mouth daily. 07/30/23   Sabharwal, Ria, DO  Multiple Vitamins-Minerals (MULTIVITAMIN ADULT EXTRA C PO) Take 1 tablet by mouth daily. 07/24/22   [provider]  nitroGLYCERIN  (NITROSTAT ) 0.4 MG SL tablet Place 1 tablet (0.4 mg total) under the tongue every 5 (five) minutes as needed for chest pain (up to 3 doses). 06/26/23   Tolia, Sunit, DO  pantoprazole  (PROTONIX ) 40 MG tablet Take 1 tablet (40 mg total) by mouth 2 (two) times daily before a meal. 12/22/22 07/29/24  Laurence Locus, DO  polyethylene glycol powder (GLYCOLAX /MIRALAX ) 17 GM/SCOOP powder Take 1 capful (17 g) with water by mouth daily as needed for mild constipation. 12/05/22    Singh, Prashant K, MD  REPATHA SURECLICK 140 MG/ML SOAJ Inject 140 mg into the skin every 14 (fourteen) days.  06/07/17   [provider]  tolnaftate (TINACTIN) 1 % cream Apply 1 application topically daily as needed (jock itch).    [provider]    Physical Exam: Vitals:   09/07/23 1716 09/07/23 2316 09/08/23 0301 09/08/23 0607  BP: 135/84 (!) 172/92 (!) 154/89 (!) 149/88  Pulse: 68 73 68 73  Resp: 14 (!) 24 15 15   Temp: 97.9 F (36.6 C) 97.8 F (36.6 C)  98 F (36.7 C)  TempSrc: Oral Oral  Oral  SpO2: 99% 100% 100% 100%   Constitutional: Elderly male currently in no acute distress Eyes: PERRL, lids and conjunctivae normal ENMT: Mucous membranes are moist.  Normal dentition.  Neck: normal, supple,   Respiratory: Mildly tachypneic with no significant wheezes or rhonchi appreciated. Cardiovascular: Regular rate and rhythm, no murmurs / rubs / gallops. 2+ pedal pulses. No carotid bruits.  Abdomen: Protuberant abdomen.  Bowel sounds positive.  Musculoskeletal: no clubbing / cyanosis. No joint deformity upper and lower extremities. Good ROM, no contractures. Normal muscle tone.  Skin: no rashes, lesions, ulcers. No induration Neurologic: CN 2-12 grossly intact. Strength 4+/5 in all 4.  Psychiatric: Poor short-term memory. Alert and oriented x send in place.  Data Reviewed:  EKG reveals sinus rhythm at 67 bpm with possible left anterior fascicular block.  Abnormal R wave progression  Assessment and Plan:  Syncope Patient had a syncopal episode after being out in the heat for unknown amount of time while letting the dog out.  Found to have AKI suspecting symptoms secondary to dehydration. - Admit to a telemetry bed - Check orthostatic vital signs - Follow-up telemetry  Acute kidney injury superimposed on chronic kidney disease stage IIIb Patient presents with creatinine elevated up to 3.04 with BUN 60.  Patient baseline creatinine noted to be around 1.6-2.   Urinalysis noted on that level protein, but no signs for infection. - Strict I&Os - Avoid possible nephrotoxic agents - Continue normal saline IV fluids at 75 mL/h - Recheck BMP this morning and monitor daily  Hyperkalemia Acute.  Initial potassium elevated at 5.8.  Patient had been given IV fluids and Lokelma 10 g p.o. - Recheck potassium level this morning - Continue IV fluids  Normocytic anemia Chronic.  Hemoglobin 11 which is higher than baseline 8 to 9 g/dL and likely secondary to hemoconcentration in the setting of dehydration. - Recheck CBC  Controlled diabetes mellitus type 2, without  long-term On admission glucose noted to be 122.  Last hemoglobin A1c noted to be 6.1 in 04/2023.  Patient is not on any medication for treatment. - Carb modified diet  Hypertension On admission blood pressures 135/84 to 172/92 - Continue isosorbide  mononitrate - Held losartan  due to AKI - Hydralazine  IV as needed  Dyslipidemia Patient is on Repatha in the outpatient setting.  Dementia Family notes that patient has had progressively worsening short-term memory, but is normally able to recognize them. - Delirium precautions  GERD - Continue Protonix    DVT prophylaxis: Heparin  Advance Care Planning:   Code Status: Full Code    Consults: None  Family Communication: Son updated at bedside  Severity of Illness: The appropriate patient status for this patient is INPATIENT. Inpatient status is judged to be reasonable and necessary in order to provide the required intensity of service to ensure the patient's safety. The patient's presenting symptoms, physical exam findings, and initial radiographic and laboratory data in the context of their chronic comorbidities is felt to place them at high risk for further clinical deterioration. Furthermore, it is not anticipated that the patient will be medically stable for discharge from the hospital within 2 midnights of admission.   * I certify that  at the point of admission it is my clinical judgment that the patient will require inpatient hospital care spanning beyond 2 midnights from the point of admission due to high intensity of service, high risk for further deterioration and high frequency of surveillance required.*  Author: Maximino DELENA Sharps, MD 09/08/2023 7:17 AM  For on call review [email or web address].

## 2023-09-08 NOTE — ED Notes (Signed)
 Bladder scanned= Patient voided= 

## 2023-09-08 NOTE — ED Notes (Signed)
 Attempted to give report x2 on pt to floor. No answer x2.

## 2023-09-08 NOTE — ED Notes (Signed)
 Patient's family member came out asking if patient could eat. Patient was provided with sand which and water. Patient stating he did not want anything to eat at this time. Family stating they would attempt to feed patient.

## 2023-09-08 NOTE — ED Notes (Signed)
 Called CCMD.

## 2023-09-09 ENCOUNTER — Inpatient Hospital Stay (HOSPITAL_COMMUNITY)

## 2023-09-09 DIAGNOSIS — R55 Syncope and collapse: Secondary | ICD-10-CM | POA: Diagnosis not present

## 2023-09-09 DIAGNOSIS — N179 Acute kidney failure, unspecified: Secondary | ICD-10-CM | POA: Diagnosis not present

## 2023-09-09 DIAGNOSIS — E875 Hyperkalemia: Secondary | ICD-10-CM | POA: Diagnosis not present

## 2023-09-09 DIAGNOSIS — D649 Anemia, unspecified: Secondary | ICD-10-CM | POA: Diagnosis not present

## 2023-09-09 LAB — ECHOCARDIOGRAM COMPLETE
Area-P 1/2: 2.45 cm2
Height: 75 in
S' Lateral: 3.6 cm
Weight: 3319.25 [oz_av]

## 2023-09-09 LAB — CBC
HCT: 30 % — ABNORMAL LOW (ref 39.0–52.0)
Hemoglobin: 9.7 g/dL — ABNORMAL LOW (ref 13.0–17.0)
MCH: 29.7 pg (ref 26.0–34.0)
MCHC: 32.3 g/dL (ref 30.0–36.0)
MCV: 91.7 fL (ref 80.0–100.0)
Platelets: 147 10*3/uL — ABNORMAL LOW (ref 150–400)
RBC: 3.27 MIL/uL — ABNORMAL LOW (ref 4.22–5.81)
RDW: 15.6 % — ABNORMAL HIGH (ref 11.5–15.5)
WBC: 6.4 10*3/uL (ref 4.0–10.5)
nRBC: 0 % (ref 0.0–0.2)

## 2023-09-09 LAB — BASIC METABOLIC PANEL WITH GFR
Anion gap: 11 (ref 5–15)
BUN: 43 mg/dL — ABNORMAL HIGH (ref 8–23)
CO2: 18 mmol/L — ABNORMAL LOW (ref 22–32)
Calcium: 9.1 mg/dL (ref 8.9–10.3)
Chloride: 108 mmol/L (ref 98–111)
Creatinine, Ser: 2.08 mg/dL — ABNORMAL HIGH (ref 0.61–1.24)
GFR, Estimated: 32 mL/min — ABNORMAL LOW (ref >60)
Glucose, Bld: 119 mg/dL — ABNORMAL HIGH (ref 70–99)
Potassium: 4.1 mmol/L (ref 3.5–5.1)
Sodium: 137 mmol/L (ref 135–145)

## 2023-09-09 NOTE — Plan of Care (Signed)

## 2023-09-09 NOTE — Evaluation (Signed)
 Physical Therapy Evaluation Patient Details Name: Ricky SHATTO Sr. MRN: [id number] DOB: 03-13-1945 Today's Date: 09/09/2023  History of Present Illness  Patient is an 78 y/o male admitted 09/07/23 with LOC/syncope.  PMH positive for HTN, CKD, HLD, DM, CVA, recurrent syncope, prostate cancer, remote gastric ulcer and GERD.  Clinical Impression  Patient presents with decreased mobility due to generalized weakness and limited activity tolerance.  Daughter reports independent at home and in the store has to sit to rest after about 15 minutes.  Patient currently needing S for safety with broad based gait and increased lateral sway though no LOB without assistive device.  Able to negotiate stairs appropriate for home entry with S as well.  Feel stable for home when medically ready without follow up PT.  Educated daughter in slow to rise with monitoring symptoms and using urinal for urgent needs.  PT will continue to follow while in the acute setting.       If plan is discharge home, recommend the following: A little help with walking and/or transfers;A little help with bathing/dressing/bathroom;Help with stairs or ramp for entrance   Can travel by private vehicle        Equipment Recommendations None recommended by PT  Recommendations for Other Services       Functional Status Assessment Patient has had a recent decline in their functional status and demonstrates the ability to make significant improvements in function in a reasonable and predictable amount of time.     Precautions / Restrictions Precautions Precautions: Fall      Mobility  Bed Mobility               General bed mobility comments: standing upon PT entry with RN Taking orthostatics    Transfers Overall transfer level: Needs assistance   Transfers: Sit to/from Stand Sit to Stand: Contact guard assist           General transfer comment: two attempts unsuccessful with S so CGA for completion from EOB with  knees higher than hips; sat onto toilet in bathroom with S    Ambulation/Gait Ambulation/Gait assistance: Supervision Gait Distance (Feet): 180 Feet Assistive device: None Gait Pattern/deviations: Step-through pattern, Wide base of support, Decreased stride length       General Gait Details: increased lateral sway, assist for safety, no LOB  Stairs Stairs: Yes Stairs assistance: Supervision Stair Management: One rail Right, Alternating pattern, Step to pattern Number of Stairs: 4 General stair comments: step through to ascend groaning with knee pain, step to to descend still groaning and alternating which knee first, reports pain both knees  Wheelchair Mobility     Tilt Bed    Modified Rankin (Stroke Patients Only)       Balance Overall balance assessment: Needs assistance   Sitting balance-Leahy Scale: Good       Standing balance-Leahy Scale: Good                               Pertinent Vitals/Pain Pain Assessment Pain Assessment: Faces Faces Pain Scale: Hurts little more Pain Location: knees with stairs Pain Descriptors / Indicators: Aching, Grimacing, Discomfort Pain Intervention(s): Monitored during session    Home Living Family/patient expects to be discharged to:: Private residence Living Arrangements: Spouse/significant other;Children Available Help at Discharge: Family;Available 24 hours/day Type of Home: House Home Access: Stairs to enter Entrance Stairs-Rails: Right;Left Entrance Stairs-Number of Steps: 5 Alternate Level Stairs-Number of Steps: has a stair  lift then  has 3 steps after that Home Layout: Two level;Bed/bath upstairs Home Equipment: Rolling Walker (2 wheels);Shower seat;Cane - single point      Prior Function Prior Level of Function : Independent/Modified Independent             Mobility Comments: using cane intermittently ADLs Comments: does all BADL's will help a little with chores     Extremity/Trunk  Assessment   Upper Extremity Assessment Upper Extremity Assessment: Overall WFL for tasks assessed    Lower Extremity Assessment Lower Extremity Assessment: Generalized weakness    Cervical / Trunk Assessment Cervical / Trunk Assessment: Kyphotic  Communication   Communication Communication: Impaired Factors Affecting Communication: Hearing impaired    Cognition Arousal: Alert Behavior During Therapy: Flat affect   PT - Cognitive impairments: History of cognitive impairments                       PT - Cognition Comments: alert and oriented to situation, slow at times to respond to command Following commands: Intact       Cueing       General Comments General comments (skin integrity, edema, etc.): Educated daughter in slow to rise, using urinal if feeling dizzy though needing to urinate, also for safety with extended toileting for BM due to risk of vasovagal    Exercises     Assessment/Plan    PT Assessment Patient needs continued PT services  PT Problem List Decreased strength;Decreased balance;Decreased mobility;Decreased safety awareness       PT Treatment Interventions DME instruction;Gait training;Stair training;Functional mobility training;Therapeutic activities;Therapeutic exercise;Balance training;Patient/family education    PT Goals (Current goals can be found in the Care Plan section)  Acute Rehab PT Goals Patient Stated Goal: return home PT Goal Formulation: With patient/family Time For Goal Achievement: 09/16/23 Potential to Achieve Goals: Good    Frequency Min 2X/week     Co-evaluation               AM-PAC PT 6 Clicks Mobility  Outcome Measure Help needed turning from your back to your side while in a flat bed without using bedrails?: None Help needed moving from lying on your back to sitting on the side of a flat bed without using bedrails?: None Help needed moving to and from a bed to a chair (including a wheelchair)?: A  Little Help needed standing up from a chair using your arms (e.g., wheelchair or bedside chair)?: A Little Help needed to walk in hospital room?: A Little Help needed climbing 3-5 steps with a railing? : A Little 6 Click Score: 20    End of Session Equipment Utilized During Treatment: Gait belt Activity Tolerance: Patient tolerated treatment well Patient left: Other (comment) (in bathroom, daughters in the room, RN Aware)   PT Visit Diagnosis: Other abnormalities of gait and mobility (R26.89)    Time: 8874-8846 PT Time Calculation (min) (ACUTE ONLY): 28 min   Charges:   PT Evaluation $PT Eval Low Complexity: 1 Low PT Treatments $Gait Training: 8-22 mins PT General Charges $$ ACUTE PT VISIT: 1 Visit         Ricky Rowland, PT Acute Rehabilitation Services Office:[phone] 09/09/2023   Ricky Rowland 09/09/2023, 12:03 PM

## 2023-09-09 NOTE — Progress Notes (Signed)
 Progress Note   Patient: Ricky Rowland FMW:[id number] DOB: [birth year] DOA: 09/07/2023     1 DOS: the patient was seen and examined on 09/09/2023   Brief hospital course: Ricky Rowland. is a 78 y.o. male with medical history significant of hypertension, hyperlipidemia, diet-controlled diabetes mellitus type 2, CVA, recurrent syncope, prostate cancer, remote gastric ulcer, and GERD presents after suspected loss of consciousness. He is admitted for syncope work up.  Assessment and Plan: Syncope and collapse- Possibly due to dehydration. Check echo. CT head unremarkable. Check orthostatic vitals.  Acute on chronic kidney injury stage III- Kidney function improved with IV hydration. He is eating fair. Will stop fluids. Continue to monitor daily renal function, electrolytes.  Hyperkalemia- Improved with fluids, lokelma. Recheck electrolytes.  Chronic anemia- Hb stable. No active bleeding.  Type 2 diabetes mellitus- Well controlled. Carb consistent diet.  Hypertension Continue isosorbide  mononitrate Resume losartan  upon discharge if kidney function, K at baseline. Hydralazine  IV as needed   Dyslipidemia Patient is on Repatha in the outpatient setting.   Dementia Mental status at baseline. Delirium precautions   GERD Continue Protonix        Out of bed to chair. Incentive spirometry. Nursing supportive care. Fall, aspiration precautions. Diet:  Diet Orders (From admission, onward)     Start     Ordered   09/08/23 0726  Diet Carb Modified Fluid consistency: Thin; Room service appropriate? Yes  Diet effective now       Question Answer Comment  Diet-HS Snack? Nothing   Calorie Level Medium 1600-2000   Fluid consistency: Thin   Room service appropriate? Yes      09/08/23 0726           DVT prophylaxis: heparin  injection 5,000 Units Start: 09/08/23 1400  Level of care: Telemetry Medical   Code Status: Full Code  Subjective: Patient is seen  and examined today morning. He is lying in bed. No complaints, eating fair. Daughter at bedside asked to talk to her mom over phone.  Physical Exam: Vitals:   09/09/23 0525 09/09/23 0526 09/09/23 0845 09/09/23 1204  BP: (!) 153/73 139/82 (!) 143/88 132/72  Pulse: 68 (!) 59 63 71  Resp: 18 18 19 19   Temp: 98 F (36.7 C) 98 F (36.7 C) 98.3 F (36.8 C) 98.6 F (37 C)  TempSrc:      SpO2: 99% 98% 98% 100%  Weight:      Height:        General - Elderly African American male, no apparent distress HEENT - PERRLA, EOMI, atraumatic head, non tender sinuses. Lung - Clear, basal rales, no rhonchi, wheezes. Heart - S1, S2 heard, no murmurs, rubs, trace pedal edema. Abdomen - Soft, non tender, bowel sounds good Neuro - Alert, awake and oriented x 3, non focal exam. Skin - Warm and dry.  Data Reviewed:      Latest Ref Rng & Units 09/09/2023    4:26 AM 09/08/2023    3:00 PM 09/07/2023    8:45 PM  CBC  WBC 4.0 - 10.5 K/uL 6.4  8.3  9.8   Hemoglobin 13.0 - 17.0 g/dL 9.7  89.4  88.9   Hematocrit 39.0 - 52.0 % 30.0  32.4  34.5   Platelets 150 - 400 K/uL 147  168  180       Latest Ref Rng & Units 09/09/2023    4:26 AM 09/08/2023    3:00 PM 09/07/2023    8:45 PM  BMP  Glucose 70 - 99 mg/dL 880  879  877   BUN 8 - 23 mg/dL 43  48  60   Creatinine 0.61 - 1.24 mg/dL 7.91  7.80  6.95   Sodium 135 - 145 mmol/L 137  137  135   Potassium 3.5 - 5.1 mmol/L 4.1  4.2  5.8   Chloride 98 - 111 mmol/L 108  107  104   CO2 22 - 32 mmol/L 18  20  20    Calcium 8.9 - 10.3 mg/dL 9.1  9.4  9.8    CT Head Wo Contrast Result Date: 09/08/2023 CLINICAL DATA:  Syncope/presyncope, cerebrovascular cause suspected EXAM: CT HEAD WITHOUT CONTRAST TECHNIQUE: Contiguous axial images were obtained from the base of the skull through the vertex without intravenous contrast. RADIATION DOSE REDUCTION: This exam was performed according to the departmental dose-optimization program which includes automated exposure control,  adjustment of the mA and/or kV according to patient size and/or use of iterative reconstruction technique. COMPARISON:  MRI head 02/24/2023.  CT head 02/22/2023. FINDINGS: Brain: No evidence of acute infarction, hemorrhage, hydrocephalus, extra-axial collection or mass lesion/mass effect. Similar remote right basal ganglia infarct. Mildly motion limited study. Vascular: No hyperdense vessel.  Calcific atherosclerosis. Skull: No acute fracture. Sinuses/Orbits: Clear sinuses. Remote left medial orbital wall fracture. Other: No mastoid effusions. IMPRESSION: No evidence of acute intracranial abnormality. Electronically Signed   By: Gilmore GORMAN Molt M.D.   On: 09/08/2023 00:42   DG Chest 2 View Result Date: 09/08/2023 CLINICAL DATA:  Near syncope. EXAM: CHEST - 2 VIEW COMPARISON:  February 22, 2023 FINDINGS: The heart size and mediastinal contours are within normal limits. There is no evidence of an acute infiltrate, pleural effusion or pneumothorax. Multilevel degenerative changes are seen throughout the thoracic spine. IMPRESSION: No active cardiopulmonary disease. Electronically Signed   By: Suzen Dials M.D.   On: 09/08/2023 00:28    Family Communication: Discussed with patient, family at bedside. They understand and agree. All questions answered.  Disposition: Status is: Inpatient Remains inpatient appropriate because: syncope work up, echo, orthostatics, PT  Planned Discharge Destination: Home with Home Health     Time spent: 39 minutes  Author: Concepcion Riser, MD 09/09/2023 3:39 PM Secure chat 7am to 7pm For on call review [email or web address].

## 2023-09-10 DIAGNOSIS — E119 Type 2 diabetes mellitus without complications: Secondary | ICD-10-CM | POA: Diagnosis not present

## 2023-09-10 DIAGNOSIS — E875 Hyperkalemia: Secondary | ICD-10-CM | POA: Diagnosis not present

## 2023-09-10 DIAGNOSIS — R55 Syncope and collapse: Secondary | ICD-10-CM | POA: Diagnosis not present

## 2023-09-10 DIAGNOSIS — N179 Acute kidney failure, unspecified: Secondary | ICD-10-CM | POA: Diagnosis not present

## 2023-09-10 NOTE — Plan of Care (Signed)

## 2023-09-10 NOTE — Discharge Summary (Signed)
 Physician Discharge Summary   Patient: Ricky COBBINS Sr. MRN: [id number] DOB: 08-Dec-1945  Admit date:     09/07/2023  Discharge date: 09/10/23  Discharge Physician: Concepcion Riser   PCP: Rexanne Ingle, MD   Recommendations at discharge:   PCP follow up in 1 week. Patient will need nephrology outpatient evaluation as kidney function worsening.  Discharge Diagnoses: Active Problems:   Syncope   Acute kidney injury superimposed on chronic kidney disease (HCC)   Hyperkalemia   Normocytic anemia   Controlled type 2 diabetes mellitus without complication, without long-term current use of insulin (HCC)   Essential hypertension   Dyslipidemia   Dementia without behavioral disturbance (HCC)   GERD (gastroesophageal reflux disease)  Resolved Problems:   * No resolved hospital problems. *  Hospital Course: Ricky Stoneking. is a 78 y.o. male with medical history significant of hypertension, hyperlipidemia, diet-controlled diabetes mellitus type 2, CVA, recurrent syncope, prostate cancer, remote gastric ulcer, and GERD presents after suspected loss of consciousness. He is admitted for syncope work up.   Assessment and Plan: Syncope and collapse- Possibly due to dehydration. Echo showed normal EF, grade 1 diastolic dysfunction. CT head unremarkable. Orthostatic vitals unremarkable. Advised him to follow PCP upon discharge in 1 week. Daughter stated that he is to follow neurology for dementia work up.   Acute on chronic kidney injury stage III- Kidney function improved with IV hydration. He is eating well. Baseline creatinine seem worsening. I advised to follow nephrology as outpatient.   Hyperkalemia- Improved with fluids, lokelma .   Chronic anemia- Hb stable. No active bleeding.   Type 2 diabetes mellitus- Well controlled. Carb consistent diet.   Hypertension Continue isosorbide  mononitrate Resumed losartan  as kidney function, K at baseline.    Dyslipidemia Patient is on Repatha in the outpatient setting.   Dementia Mental status at baseline. Delirium precautions. Outpatient neurology follow up for further work up.   GERD Continue Protonix         Consultants: none Procedures performed: none  Disposition: Home Diet recommendation:  Discharge Diet Orders (From admission, onward)     Start     Ordered   09/10/23 0000  Diet - low sodium heart healthy        09/10/23 1102           Cardiac diet DISCHARGE MEDICATION: Allergies as of 09/10/2023       Reactions   Ezetimibe Other (See Comments)   Muscle aches   Statins Other (See Comments)   Joint pain        Medication List     TAKE these medications    acetaminophen  650 MG CR tablet Commonly known as: TYLENOL  Take 1,300 mg by mouth every evening. Take 2 tablets by mouth every evening at 1830 for pain. Additional doses as needed if desired.   allopurinol  300 MG tablet Commonly known as: ZYLOPRIM  Take 300 mg by mouth daily.   CLEAR EYES COMPLETE OP Place 1-2 drops into both eyes daily as needed (Dry, red, irritated eyes.).   clopidogrel  75 MG tablet Commonly known as: PLAVIX  Take 75 mg by mouth daily.   docusate sodium  100 MG capsule Commonly known as: COLACE Take 2 capsules (200 mg total) by mouth daily as needed for mild constipation.   ferrous sulfate  325 (65 FE) MG tablet Take 325 mg by mouth daily with breakfast.   isosorbide  mononitrate 30 MG 24 hr tablet Commonly known as: IMDUR  Take 1 tablet (30 mg total) by mouth daily. What  changed:  when to take this additional instructions   losartan  100 MG tablet Commonly known as: COZAAR  Take 1 tablet (100 mg total) by mouth daily.   MULTIVITAMIN ADULT EXTRA C PO Take 1 tablet by mouth daily.   nitroGLYCERIN  0.4 MG SL tablet Commonly known as: NITROSTAT  Place 1 tablet (0.4 mg total) under the tongue every 5 (five) minutes as needed for chest pain (up to 3 doses).   pantoprazole  40  MG tablet Commonly known as: Protonix  Take 1 tablet (40 mg total) by mouth 2 (two) times daily before a meal.   polyethylene glycol powder 17 GM/SCOOP powder Commonly known as: GLYCOLAX /MIRALAX  Take 1 capful (17 g) with water by mouth daily as needed for mild constipation.   Repatha SureClick 140 MG/ML Soaj Generic drug: Evolocumab Inject 140 mg into the skin every 14 (fourteen) days. Inject on Monday   tolnaftate  1 % cream Commonly known as: TINACTIN Apply 1 application topically daily as needed (jock itch).        Discharge Exam: Filed Weights   09/09/23 0300  Weight: 94.1 kg   Vitals:   09/09/23 2131 09/10/23 0010  BP: 116/71 (!) 142/65  Pulse: 71 65  Resp: 18 18  Temp: 98 F (36.7 C) 98.3 F (36.8 C)  SpO2: 100% 99%    General - Elderly African American male, no apparent distress HEENT - PERRLA, EOMI, atraumatic head, non tender sinuses. Lung - Clear, basal rales, no rhonchi, wheezes. Heart - S1, S2 heard, no murmurs, rubs, trace pedal edema. Abdomen - Soft, non tender, bowel sounds good Neuro - Alert, awake and oriented x 3, non focal exam. Skin - Warm and dry.  Condition at discharge: stable  The results of significant diagnostics from this hospitalization (including imaging, microbiology, ancillary and laboratory) are listed below for reference.   Imaging Studies: ECHOCARDIOGRAM COMPLETE Result Date: 09/09/2023    ECHOCARDIOGRAM REPORT   Patient Name:   Ricky PENLEY Sr. Date of Exam: 09/09/2023 Medical Rec #:  [id number]             Height:       75.0 in Accession #:    [id number]            Weight:       207.5 lb Date of Birth:  Sep 08, 1945             BSA:          2.229 m Patient Age:    78 years              BP:           132/72 mmHg Patient Gender: M                     HR:           62 bpm. Exam Location:  Inpatient Procedure: 2D Echo, Cardiac Doppler and Color Doppler (Both Spectral and Color            Flow Doppler were utilized during procedure).  Indications:    Syncope  History:        Patient has prior history of Echocardiogram examinations, most                 recent 02/23/2023. CAD, Signs/Symptoms:Syncope; Risk                 Factors:Hypertension and Dyslipidemia.  Sonographer:    Philomena Daring Referring Phys: [id number] CONCEPCION RISER IMPRESSIONS  1. Left ventricular ejection fraction, by estimation, is 55 to 60%. The left ventricle has normal function. The left ventricle has no regional wall motion abnormalities. There is mild concentric left ventricular hypertrophy. Left ventricular diastolic parameters are consistent with Grade I diastolic dysfunction (impaired relaxation).  2. Right ventricular systolic function is normal. The right ventricular size is normal. The estimated right ventricular systolic pressure is 20.1 mmHg.  3. The mitral valve is normal in structure. Trivial mitral valve regurgitation. No evidence of mitral stenosis.  4. The aortic valve is tricuspid. There is mild calcification of the aortic valve. Aortic valve regurgitation is trivial.  5. The inferior vena cava is normal in size with greater than 50% respiratory variability, suggesting right atrial pressure of 3 mmHg. FINDINGS  Left Ventricle: Left ventricular ejection fraction, by estimation, is 55 to 60%. The left ventricle has normal function. The left ventricle has no regional wall motion abnormalities. The left ventricular internal cavity size was normal in size. There is  mild concentric left ventricular hypertrophy. Left ventricular diastolic parameters are consistent with Grade I diastolic dysfunction (impaired relaxation). Right Ventricle: The right ventricular size is normal. No increase in right ventricular wall thickness. Right ventricular systolic function is normal. The tricuspid regurgitant velocity is 2.07 m/s, and with an assumed right atrial pressure of 3 mmHg, the estimated right ventricular systolic pressure is 20.1 mmHg. Left Atrium: Left atrial size was  normal in size. Right Atrium: Right atrial size was normal in size. Pericardium: Trivial pericardial effusion is present. Mitral Valve: The mitral valve is normal in structure. Trivial mitral valve regurgitation. No evidence of mitral valve stenosis. Tricuspid Valve: The tricuspid valve is normal in structure. Tricuspid valve regurgitation is trivial. Aortic Valve: The aortic valve is tricuspid. There is mild calcification of the aortic valve. Aortic valve regurgitation is trivial. Pulmonic Valve: The pulmonic valve was normal in structure. Pulmonic valve regurgitation is not visualized. Aorta: The aortic root is normal in size and structure. Venous: The inferior vena cava is normal in size with greater than 50% respiratory variability, suggesting right atrial pressure of 3 mmHg. IAS/Shunts: No atrial level shunt detected by color flow Doppler.  LEFT VENTRICLE PLAX 2D LVIDd:         5.30 cm   Diastology LVIDs:         3.60 cm   LV e' medial:    4.57 cm/s LV PW:         1.30 cm   LV E/e' medial:  12.0 LV IVS:        1.20 cm   LV e' lateral:   3.59 cm/s LVOT diam:     2.00 cm   LV E/e' lateral: 15.3 LV SV:         73 LV SV Index:   33 LVOT Area:     3.14 cm  RIGHT VENTRICLE             IVC RV S prime:     15.60 cm/s  IVC diam: 1.40 cm TAPSE (M-mode): 2.5 cm LEFT ATRIUM             Index        RIGHT ATRIUM           Index LA diam:        3.60 cm 1.62 cm/m   RA Area:     15.90 cm LA Vol (A2C):   35.9 ml 16.11 ml/m  RA Volume:   37.60 ml  16.87  ml/m LA Vol (A4C):   44.6 ml 20.01 ml/m LA Biplane Vol: 40.4 ml 18.13 ml/m  AORTIC VALVE LVOT Vmax:   117.00 cm/s LVOT Vmean:  74.400 cm/s LVOT VTI:    0.232 m  AORTA Ao Root diam: 3.20 cm Ao Asc diam:  3.30 cm MITRAL VALVE                TRICUSPID VALVE MV Area (PHT): 2.45 cm     TR Peak grad:   17.1 mmHg MV Decel Time: 310 msec     TR Vmax:        207.00 cm/s MV E velocity: 55.00 cm/s MV A velocity: 117.00 cm/s  SHUNTS MV E/A ratio:  0.47         Systemic VTI:  0.23 m                              Systemic Diam: 2.00 cm Dalton McleanMD Electronically signed by Ezra Kanner Signature Date/Time: 09/09/2023/5:18:29 PM    Final    CT Head Wo Contrast Result Date: 09/08/2023 CLINICAL DATA:  Syncope/presyncope, cerebrovascular cause suspected EXAM: CT HEAD WITHOUT CONTRAST TECHNIQUE: Contiguous axial images were obtained from the base of the skull through the vertex without intravenous contrast. RADIATION DOSE REDUCTION: This exam was performed according to the departmental dose-optimization program which includes automated exposure control, adjustment of the mA and/or kV according to patient size and/or use of iterative reconstruction technique. COMPARISON:  MRI head 02/24/2023.  CT head 02/22/2023. FINDINGS: Brain: No evidence of acute infarction, hemorrhage, hydrocephalus, extra-axial collection or mass lesion/mass effect. Similar remote right basal ganglia infarct. Mildly motion limited study. Vascular: No hyperdense vessel.  Calcific atherosclerosis. Skull: No acute fracture. Sinuses/Orbits: Clear sinuses. Remote left medial orbital wall fracture. Other: No mastoid effusions. IMPRESSION: No evidence of acute intracranial abnormality. Electronically Signed   By: Gilmore GORMAN Molt M.D.   On: 09/08/2023 00:42   DG Chest 2 View Result Date: 09/08/2023 CLINICAL DATA:  Near syncope. EXAM: CHEST - 2 VIEW COMPARISON:  February 22, 2023 FINDINGS: The heart size and mediastinal contours are within normal limits. There is no evidence of an acute infiltrate, pleural effusion or pneumothorax. Multilevel degenerative changes are seen throughout the thoracic spine. IMPRESSION: No active cardiopulmonary disease. Electronically Signed   By: Suzen Dials M.D.   On: 09/08/2023 00:28    Microbiology: Results for orders placed or performed during the hospital encounter of 12/01/22  Resp panel by RT-PCR (RSV, Flu A&B, Covid) Anterior Nasal Swab     Status: None   Collection Time: 12/01/22  11:30 AM   Specimen: Anterior Nasal Swab  Result Value Ref Range Status   SARS Coronavirus 2 by RT PCR NEGATIVE NEGATIVE Final   Influenza A by PCR NEGATIVE NEGATIVE Final   Influenza B by PCR NEGATIVE NEGATIVE Final    Comment: (NOTE) The Xpert Xpress SARS-CoV-2/FLU/RSV plus assay is intended as an aid in the diagnosis of influenza from Nasopharyngeal swab specimens and should not be used as a sole basis for treatment. Nasal washings and aspirates are unacceptable for Xpert Xpress SARS-CoV-2/FLU/RSV testing.  Fact Sheet for Patients: BloggerCourse.com  Fact Sheet for Healthcare Providers: SeriousBroker.it  This test is not yet approved or cleared by the [place]  FDA and has been authorized for detection and/or diagnosis of SARS-CoV-2 by FDA under an Emergency Use Authorization (EUA). This EUA will remain in effect (meaning this test can be used) for  the duration of the COVID-19 declaration under Section 564(b)(1) of the Act, 21 U.S.C. section 360bbb-3(b)(1), unless the authorization is terminated or revoked.     Resp Syncytial Virus by PCR NEGATIVE NEGATIVE Final    Comment: (NOTE) Fact Sheet for Patients: BloggerCourse.com  Fact Sheet for Healthcare Providers: SeriousBroker.it  This test is not yet approved or cleared by the [place]  FDA and has been authorized for detection and/or diagnosis of SARS-CoV-2 by FDA under an Emergency Use Authorization (EUA). This EUA will remain in effect (meaning this test can be used) for the duration of the COVID-19 declaration under Section 564(b)(1) of the Act, 21 U.S.C. section 360bbb-3(b)(1), unless the authorization is terminated or revoked.  Performed at [facility], [address] [place], [place] [address]     Labs: CBC: Recent Labs  Lab 09/07/23 2045 09/08/23 1500 09/09/23 0426  WBC 9.8 8.3 6.4   NEUTROABS  --  5.7  --   HGB 11.0* 10.5* 9.7*  HCT 34.5* 32.4* 30.0*  MCV 94.0 91.8 91.7  PLT 180 168 147*   Basic Metabolic Panel: Recent Labs  Lab 09/07/23 2045 09/08/23 1500 09/09/23 0426  NA 135 137 137  K 5.8* 4.2 4.1  CL 104 107 108  CO2 20* 20* 18*  GLUCOSE 122* 120* 119*  BUN 60* 48* 43*  CREATININE 3.04* 2.19* 2.08*  CALCIUM 9.8 9.4 9.1   Liver Function Tests: Recent Labs  Lab 09/07/23 2045  AST 20  ALT 14  ALKPHOS 87  BILITOT 0.5  PROT 7.9  ALBUMIN 4.0   CBG: Recent Labs  Lab 09/07/23 1759  GLUCAP 151*    Discharge time spent: 34 minutes.  Signed: Concepcion Riser, MD [facility] 09/10/2023

## 2023-09-30 ENCOUNTER — Other Ambulatory Visit: Payer: Self-pay | Admitting: Cardiology

## 2023-09-30 DIAGNOSIS — R072 Precordial pain: Secondary | ICD-10-CM

## 2023-10-13 ENCOUNTER — Other Ambulatory Visit: Payer: Self-pay | Admitting: Nephrology

## 2023-10-13 DIAGNOSIS — N184 Chronic kidney disease, stage 4 (severe): Secondary | ICD-10-CM

## 2023-10-14 ENCOUNTER — Ambulatory Visit
Admission: RE | Admit: 2023-10-14 | Discharge: 2023-10-14 | Disposition: A | Discharge status: Home / Self Care | Source: Ambulatory Visit | Attending: Nephrology | Admitting: Nephrology

## 2023-10-14 ENCOUNTER — Other Ambulatory Visit

## 2023-10-14 DIAGNOSIS — N184 Chronic kidney disease, stage 4 (severe): Secondary | ICD-10-CM

## 2023-10-19 ENCOUNTER — Telehealth: Payer: Self-pay

## 2023-10-19 NOTE — Telephone Encounter (Signed)
   Pre-operative Risk Assessment    Patient Name: Ricky Rowland  DOB: 1945-12-22 MRN: [id number]   Date of last office visit: 08/21/23 MAUDE EMMER, MD Date of next office visit: 12/09/23 MAUDE EMMER, MD   Request for Surgical Clearance    Procedure:  RIGHT CARPAL AND CUBITAL TUNNEL RELEASE  Date of Surgery:  Clearance TBD                                Surgeon:  OZELL HOCK, MD Surgeon's Group or Practice Name:  [facility] Phone number:  [phone] Fax number:  [phone]   Type of Clearance Requested:   - Medical  - Pharmacy:  Hold Clopidogrel  (Plavix )     Type of Anesthesia:  General    Additional requests/questions:    SignedLucie DELENA Ku   10/19/2023, 8:33 AM

## 2023-10-20 ENCOUNTER — Other Ambulatory Visit: Payer: Self-pay

## 2023-10-21 ENCOUNTER — Telehealth: Payer: Self-pay

## 2023-10-21 NOTE — Telephone Encounter (Signed)
   Name: Ricky Rowland  DOB: [birth year]  MRN: [id number]  Primary Cardiologist: Maude Emmer, MD   Preoperative team, please contact this patient and set up a phone call appointment for further preoperative risk assessment. Please obtain consent and complete medication review. Thank you for your help.  I confirm that guidance regarding antiplatelet and oral anticoagulation therapy has been completed and, if necessary, noted below.  Per office protocol, if patient is without any new symptoms or concerns at the time of their virtual visit, he may hold Plavix  for 5 days prior to procedure. Please resume Plavix  as soon as possible postprocedure, at the discretion of the surgeon.    I also confirmed the patient resides in the state of [place] . As per [facility] Medical Board telemedicine laws, the patient must reside in the state in which the provider is licensed.   Lamarr Satterfield, NP 10/21/2023, 9:40 AM [facility]

## 2023-10-21 NOTE — Telephone Encounter (Signed)
 Pt scheduled for VV on 10/28/23

## 2023-10-21 NOTE — Telephone Encounter (Signed)
  Patient Consent for Virtual Visit        Ricky RUFO Sr. has provided verbal consent on 10/21/2023 for a virtual visit (video or telephone).   CONSENT FOR VIRTUAL VISIT FOR:  Ricky JINNY Lowers Sr.  By participating in this virtual visit I agree to the following:  I hereby voluntarily request, consent and authorize [facility] and its employed or contracted physicians, physician assistants, nurse practitioners or other licensed health care professionals (the Practitioner), to provide me with telemedicine health care services (the "Services) as deemed necessary by the treating Practitioner. I acknowledge and consent to receive the Services by the Practitioner via telemedicine. I understand that the telemedicine visit will involve communicating with the Practitioner through live audiovisual communication technology and the disclosure of certain medical information by electronic transmission. I acknowledge that I have been given the opportunity to request an in-person assessment or other available alternative prior to the telemedicine visit and am voluntarily participating in the telemedicine visit.  I understand that I have the right to withhold or withdraw my consent to the use of telemedicine in the course of my care at any time, without affecting my right to future care or treatment, and that the Practitioner or I may terminate the telemedicine visit at any time. I understand that I have the right to inspect all information obtained and/or recorded in the course of the telemedicine visit and may receive copies of available information for a reasonable fee.  I understand that some of the potential risks of receiving the Services via telemedicine include:  Delay or interruption in medical evaluation due to technological equipment failure or disruption; Information transmitted may not be sufficient (e.g. poor resolution of images) to allow for appropriate medical decision making by the  Practitioner; and/or  In rare instances, security protocols could fail, causing a breach of personal health information.  Furthermore, I acknowledge that it is my responsibility to provide information about my medical history, conditions and care that is complete and accurate to the best of my ability. I acknowledge that Practitioner's advice, recommendations, and/or decision may be based on factors not within their control, such as incomplete or inaccurate data provided by me or distortions of diagnostic images or specimens that may result from electronic transmissions. I understand that the practice of medicine is not an exact science and that Practitioner makes no warranties or guarantees regarding treatment outcomes. I acknowledge that a copy of this consent can be made available to me via my patient portal [facility] MyChart), or I can request a printed copy by calling the office of [facility].    I understand that my insurance will be billed for this visit.   I have read or had this consent read to me. I understand the contents of this consent, which adequately explains the benefits and risks of the Services being provided via telemedicine.  I have been provided ample opportunity to ask questions regarding this consent and the Services and have had my questions answered to my satisfaction. I give my informed consent for the services to be provided through the use of telemedicine in my medical care

## 2023-10-27 ENCOUNTER — Telehealth: Payer: Self-pay | Admitting: Cardiovascular Disease

## 2023-10-27 NOTE — Telephone Encounter (Signed)
 I will inform the requesting office the pt has a tele preop visit 10/28/23. Once the pt has been cleared our team will fax clearance notes.

## 2023-10-27 NOTE — Telephone Encounter (Signed)
 Caller Calvin) is following-up on status of patient's clearance.

## 2023-10-27 NOTE — Telephone Encounter (Signed)
 10/27/23  4:00 PM Note I will inform the requesting office the pt has a tele preop visit 10/28/23. Once the pt has been cleared our team will fax clearance notes.        10/27/23  3:47 PM Tanda Needle B routed this conversation to Cv Div Preop Callback (Selected Message) Wilson, Jasmin B JW   10/27/23  3:47 PM Note Caller Calvin) is following-up on status of patient's clearance.       10/27/23  3:46 PM [place], Plastic and Reconstructive Surgery contacted Wilson, Jasmin B

## 2023-10-28 ENCOUNTER — Ambulatory Visit: Attending: Cardiology | Admitting: Emergency Medicine

## 2023-10-28 DIAGNOSIS — Z01810 Encounter for preprocedural cardiovascular examination: Secondary | ICD-10-CM | POA: Diagnosis not present

## 2023-10-28 NOTE — Progress Notes (Signed)
 Virtual Visit via Telephone Note   Because of Ricky ELZEY Sr. co-morbid illnesses, he is at least at moderate risk for complications without adequate follow up.  This format is felt to be most appropriate for this patient at this time.  Due to technical limitations with video connection (technology), today's appointment will be conducted as an audio only telehealth visit, and Ricky PLUSH Sr. verbally agreed to proceed in this manner.   All issues noted in this document were discussed and addressed.  No physical exam could be performed with this format.  Evaluation Performed:  Preoperative cardiovascular risk assessment _____________   Date:  10/28/2023   Patient ID:  Ricky JINNY Lowers Sr., DOB [birth year], MRN [id number] Patient Location:  Home Provider location:   Office  Primary Care Provider:  Rexanne Ingle, MD Primary Cardiologist:  Ricky Emmer, MD  Chief Complaint / Patient Profile   78 y.o. y/o male with a h/o NSTEMI s/p PCI to the LAD on 11/16/2022 and known CTA of RCA, MGUS, prostate cancer s/p radiotherapy, remote stroke, recurrent GI bleeds s/p EGD and capsule endoscopy, HFrEF, hypertension, hyperlipidemia, history of syncope, CKD stage IIIa who is pending right carpal tunnel and cubital tunnel release on date TBD with [facility] cosmetic and reconstructive surgery and presents today for telephonic preoperative cardiovascular risk assessment.  History of Present Illness    Ricky Rowland. is a 78 y.o. male who presents via audio/video conferencing for a telehealth visit today.  Pt was last seen in cardiology clinic on 08/21/2023 by Dr. Emmer.  At that time Ricky YARBROUGH Sr. was doing well.  The patient is now pending procedure as outlined above. Since his last visit, he denies chest pain, shortness of breath, lower extremity edema, palpitations, melena, hematuria, hemoptysis, diaphoresis, weakness, presyncope, syncope, orthopnea, and  PND.  He underwent successful PTCA and stent placement to mid LAD on 11/16/2022.  He underwent echocardiogram on 09/09/2023 showing LVEF has normalized at 55 to 60% with no RWMA and mild LVH with grade 1 DD.  Today patient is doing well overall.  He is without any acute cardiovascular concerns or complaints at this time.  He has been compliant with his current medication regimen.  He tells me his weight is stable around 220 pounds and he feels to be euvolemic.  He denies any chest pain or other exertional/anginal symptoms.  He does experience fatigue however this has been chronic and stable.  He denies any further episodes of syncope and reports his blood pressure has been well-controlled.  He stays mildly active mainly with house chores and work.  He does have stairs in his home that he can walk up and down without limitation at this time.  Overall he is able to complete greater than 4 METS.  Past Medical History    Past Medical History:  Diagnosis Date   Acute kidney injury superimposed on chronic kidney disease (HCC) 11/19/2022   Borderline diabetes    DIET CONTROLLED   Bradycardia 11/17/2022   Dyslipidemia    Elevated PSA    External carotid artery stenosis    MILD-- BILATERAL   Fall 11/05/2017   GERD (gastroesophageal reflux disease)    Gout, arthritis    Hearing loss in left ear    Heart murmur    MILD --  ASYMPTOMATIC   History of gastric ulcer    REMOTE HX   Hypertension    Pre-diabetes    Prostate cancer (HCC)  12/21/2012   Gleason 3+4=7   Seizure-like activity (HCC)    Stroke (HCC)    Syncope and collapse    Wears glasses    Past Surgical History:  Procedure Laterality Date   CORONARY STENT INTERVENTION N/A 11/16/2022   Procedure: CORONARY STENT INTERVENTION;  Surgeon: Elmira Newman PARAS, MD;  Location: [facility] INVASIVE [facility];  Service: Cardiovascular;  Laterality: N/A;   CORONARY ULTRASOUND/IVUS N/A 11/16/2022   Procedure: Coronary Ultrasound/IVUS;  Surgeon: Elmira Newman PARAS, MD;  Location: [facility]VASIVE CV [facility];  Service: Cardiovascular;  Laterality: N/A;   ENTEROSCOPY N/A 12/21/2022   Procedure: ENTEROSCOPY;  Surgeon: Rosalie Kitchens, MD;  Location: [facility] ENDOSCOPY;  Service: Gastroenterology;  Laterality: N/A;   ESOPHAGOGASTRODUODENOSCOPY (EGD) WITH PROPOFOL  N/A 12/02/2022   Procedure: ESOPHAGOGASTRODUODENOSCOPY (EGD) WITH PROPOFOL ;  Surgeon: Dianna Specking, MD;  Location: [facility] ENDOSCOPY;  Service: Gastroenterology;  Laterality: N/A;   ESOPHAGOGASTRODUODENOSCOPY (EGD) WITH PROPOFOL  N/A 12/18/2022   Procedure: ESOPHAGOGASTRODUODENOSCOPY (EGD) WITH PROPOFOL ;  Surgeon: Saintclair Jasper, MD;  Location[facility] ENDOSCOPY;  Service: Gastroenterology;  Laterality: N/A;   GIVENS CAPSULE STUDY N/A 12/18/2022   Procedure: GIVENS CAPSULE STUDY;  Surgeon: Saintclair Jasper, MD;  Location: [facility] ENDOSCOPY;  Service: Gastroenterology;  Laterality: N/A;   HOT HEMOSTASIS N/A 12/21/2022   Procedure: HOT HEMOSTASIS (ARGON PLASMA COAGULATION/BICAP);  Surgeon: Rosalie Kitchens, MD;  Location: [facility] ENDOSCOPY;  Service: Gastroenterology;  Laterality: N/A;  Straight Fire Probe   KNEE ARTHROSCOPY W/ MENISCAL REPAIR Left 03/10/2012   LEFT HEART CATH AND CORONARY ANGIOGRAPHY N/A 11/16/2022   Procedure: LEFT HEART CATH AND CORONARY ANGIOGRAPHY;  Surgeon: Elmira Newman PARAS, MD;  Location: [facility];  Service: Cardiovascular;  Laterality: N/A;   LOOP RECORDER INSERTION N/A 01/05/2018   Procedure: LOOP RECORDER INSERTION;  Surgeon: Fernande Elspeth BROCKS, MD;  Location: [facility] INVASIVE CV LAB;  Service: Cardiovascular;  Laterality: N/A;   LUMBAR SPINE SURGERY  03/10/1989   PROSTATE BIOPSY N/A 12/21/2012   Procedure: BIOPSY TRANSRECTAL ULTRASONIC PROSTATE (TUBP);  Surgeon: Thomasine Oiler, MD;  Location: [facility];  Service: Urology;  Laterality: N/A;   PROSTATE BIOPSY  11/26/2011   benign   TEMPORARY PACEMAKER N/A 11/16/2022   Procedure: TEMPORARY PACEMAKER;  Surgeon: Elmira Newman PARAS, MD;   Location: [facility]VAS[facility];  Service: Cardiovascular;  Laterality: N/A;   TONSILLECTOMY     WISDOM TOOTH EXTRACTION      Allergies  Allergies  Allergen Reactions   Ezetimibe Other (See Comments)    Muscle aches   Statins Other (See Comments)    Joint pain    Home Medications    Prior to Admission medications   Medication Sig Start Date End Date Taking? Authorizing Provider  acetaminophen  (TYLENOL ) 650 MG CR tablet Take 1,300 mg by mouth every evening. Take 2 tablets by mouth every evening at 1830 for pain. Additional doses as needed if desired.    [provider]  allopurinol  (ZYLOPRIM ) 300 MG tablet Take 300 mg by mouth daily.    [provider]  clopidogrel  (PLAVIX ) 75 MG tablet Take 75 mg by mouth daily. 01/05/23   [provider]  docusate sodium  (COLACE) 100 MG capsule Take 2 capsules (200 mg total) by mouth daily as needed for mild constipation. 12/05/22   Singh, Prashant K, MD  ferrous sulfate  325 (65 FE) MG tablet Take 325 mg by mouth daily with breakfast.    [provider]  Hyprom-Naphaz-Polysorb-Zn Sulf (CLEAR EYES COMPLETE OP) Place 1-2 drops into both eyes daily as  needed (Dry, red, irritated eyes.).    [provider]  isosorbide  mononitrate (IMDUR ) 30 MG 24 hr tablet TAKE 1 TABLET(30 MG) BY MOUTH DAILY 10/01/23   Nishan, Peter C, MD  losartan  (COZAAR ) 100 MG tablet Take 1 tablet (100 mg total) by mouth daily. 07/30/23   Sabharwal, Ria, DO  Multiple Vitamins-Minerals (MULTIVITAMIN ADULT EXTRA C PO) Take 1 tablet by mouth daily. 07/24/22   [provider]  nitroGLYCERIN  (NITROSTAT ) 0.4 MG SL tablet Place 1 tablet (0.4 mg total) under the tongue every 5 (five) minutes as needed for chest pain (up to 3 doses). 06/26/23   Tolia, Sunit, DO  pantoprazole  (PROTONIX ) 40 MG tablet Take 1 tablet (40 mg total) by mouth 2 (two) times daily before a meal. 12/22/22 07/29/24  Laurence Locus, DO  polyethylene glycol powder  (GLYCOLAX /MIRALAX ) 17 GM/SCOOP powder Take 1 capful (17 g) with water by mouth daily as needed for mild constipation. 12/05/22   Singh, Prashant K, MD  REPATHA SURECLICK 140 MG/ML SOAJ Inject 140 mg into the skin every 14 (fourteen) days. Inject on Monday 06/07/17   [provider]  tolnaftate  (TINACTIN) 1 % cream Apply 1 application topically daily as needed (jock itch).    [provider]    Physical Exam    Vital Signs:  Ricky ICKES Sr. does not have vital signs available for review today.  Given telephonic nature of communication, physical exam is limited. AAOx3. NAD. Normal affect.  Speech and respirations are unlabored.  Accessory Clinical Findings    None  Assessment & Plan    1.  Preoperative Cardiovascular Risk Assessment: According to the Revised Cardiac Risk Index (RCRI), his Perioperative Risk of Major Cardiac Event is (%): 11. His Functional Capacity in METs is: 5.07 according to the Duke Activity Status Index (DASI). Therefore, based on ACC/AHA guidelines, patient would be at acceptable risk for the planned procedure without further cardiovascular testing.  The patient was advised that if he develops new symptoms prior to surgery to contact our office to arrange for a follow-up visit, and he verbalized understanding.  He may hold Plavix  for 5 days prior to procedure. Please resume Plavix  as soon as possible postprocedure, at the discretion of the surgeon.    A copy of this note will be routed to requesting surgeon.  Time:   Today, I have spent 10 minutes with the patient with telehealth technology discussing medical history, symptoms, and management plan.     Ricky LITTIE Louis, NP  10/28/2023, 11:00 AM

## 2023-11-11 ENCOUNTER — Encounter: Payer: Self-pay | Admitting: Cardiovascular Disease

## 2023-11-11 ENCOUNTER — Ambulatory Visit: Attending: Cardiovascular Disease | Admitting: Cardiovascular Disease

## 2023-11-11 VITALS — BP 162/80 | HR 64 | Ht 75.0 in | Wt 223.0 lb

## 2023-11-11 DIAGNOSIS — R55 Syncope and collapse: Secondary | ICD-10-CM | POA: Diagnosis not present

## 2023-11-11 DIAGNOSIS — Z9861 Coronary angioplasty status: Secondary | ICD-10-CM

## 2023-11-11 DIAGNOSIS — Z01810 Encounter for preprocedural cardiovascular examination: Secondary | ICD-10-CM

## 2023-11-11 DIAGNOSIS — I10 Essential (primary) hypertension: Secondary | ICD-10-CM | POA: Diagnosis not present

## 2023-11-11 DIAGNOSIS — I2510 Atherosclerotic heart disease of native coronary artery without angina pectoris: Secondary | ICD-10-CM | POA: Diagnosis not present

## 2023-11-11 NOTE — Progress Notes (Signed)
 CARDIOLOGY CONSULT NOTE       Patient ID: Ricky KAWANO Sr. MRN: [id number] DOB/AGE: 80-Jun-1947 80 y.o.   Primary Physician: Rexanne Ingle, MD Primary Cardiologist: Geordan Xu/Sabharwal Reason for Consultation: CHF    HPI:  78 y.o. I have not seen in a while Followed more recently by CHF Dr Gardenia. NSTEMI s/p PCI to the LAD on 11/16/22 and known CTO RCA , hx of MGUS, hx of prostate cancer s/p radiotherapy on Xtandi , recurrent GI bleeds s/p EGD and capsule endoscopy and HFrEF (LVEF 40-45%)   Hospitalized with Hb 6.15 December 2022. Transitioned to plavix  only AVM;s post APC. TTE 02/23/23 EF 55-60% normal RV no significant valve dx. Dyspnea improved with Rx anemia.   Duplex 02/24/23 showed 40-59% Left ICA stenosis   Labs 07/30/23 BNP 115 Hct 28.9 04/14/23  Cr 1.7 02/23/23   Has little pain despite lumbar mets just using tylenol   Urinating fine   Had ? Syncope July with azotemia and dehydration. ECG normal no acute changes no arrhythmias. Echo with normal EF Not thought to be cardiac Hydrated and d/c home  Scheduled to have hand surgery with a Dr Germaine Belton to have and hold plavix  5 days before   ROS All other systems reviewed and negative except as noted above  Past Medical History:  Diagnosis Date   Acute kidney injury superimposed on chronic kidney disease (HCC) 11/19/2022   Borderline diabetes    DIET CONTROLLED   Bradycardia 11/17/2022   Dyslipidemia    Elevated PSA    External carotid artery stenosis    MILD-- BILATERAL   Fall 11/05/2017   GERD (gastroesophageal reflux disease)    Gout, arthritis    Hearing loss in left ear    Heart murmur    MILD --  ASYMPTOMATIC   History of gastric ulcer    REMOTE HX   Hypertension    Pre-diabetes    Prostate cancer (HCC) 12/21/2012   Gleason 3+4=7   Seizure-like activity (HCC)    Stroke (HCC)    Syncope and collapse    Wears glasses     Family History  Problem Relation Age of Onset   Heart disease Mother    Heart  disease Father    Cancer Sister        lung   Cancer Brother        prostate   Syncope episode Neg Hx     Social History   Socioeconomic History   Marital status: Married    Spouse name: geraldine   Number of children: 3   Years of education: some college   Highest education level: High school graduate  Occupational History   Occupation: retired  Tobacco Use   Smoking status: Former    Current packs/day: 0.00    Average packs/day: 1 pack/day for 30.0 years (30.0 ttl pk-yrs)    Types: Cigarettes    Start date: 12/16/1967    Quit date: 12/15/1997    Years since quitting: 25.9   Smokeless tobacco: Never  Vaping Use   Vaping status: Never Used  Substance and Sexual Activity   Alcohol use: Not Currently    Alcohol/week: 20.0 standard drinks of alcohol    Types: 20 Shots of liquor per week    Comment: OCCASIONAL, 12/28/17 stopped 11/05/17   Drug use: No   Sexual activity: Not on file  Other Topics Concern   Not on file  Social History Narrative   Lives with wife at home    caffeine  use- occas, mostly decaf coffee   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    Social Drivers of Health   Financial Resource Strain: Low Risk  (11/18/2022)   Overall Financial Resource Strain (CARDIA)    Difficulty of Paying Living Expenses: Not hard at all  Food Insecurity: No Food Insecurity (09/08/2023)   Hunger Vital Sign    Worried About Running Out of Food in the Last Year: Never true    Ran Out of Food in the Last Year: Never true  Transportation Needs: No Transportation Needs (09/08/2023)   PRAPARE - Administrator, Civil Service (Medical): No    Lack of Transportation (Non-Medical): No  Physical Activity: Not on file  Stress: Not on file  Social Connections: Patient Declined (09/08/2023)   Social Connection and Isolation Panel    Frequency of Communication with Friends and Family: Patient declined    Frequency of Social Gatherings with Friends and Family: Patient declined    Attends Religious Services:  Patient declined    Active Member of Clubs or Organizations: Patient declined    Attends Banker Meetings: Patient declined    Marital Status: Patient declined  Intimate Partner Violence: Not At Risk (09/08/2023)   Humiliation, Afraid, Rape, and Kick questionnaire    Fear of Current or Ex-Partner: No    Emotionally Abused: No    Physically Abused: No    Sexually Abused: No    Past Surgical History:  Procedure Laterality Date   CORONARY STENT INTERVENTION N/A 11/16/2022   Procedure: CORONARY STENT INTERVENTION;  Surgeon: Elmira Newman PARAS, MD;  Location: [facility];  Service: Cardiovascular;  Laterality: N/A;   CORONARY ULTRASOUND/IVUS N/A 11/16/2022   Procedure: Coronary Ultrasound/IVUS;  Surgeon: Elmira Newman PARAS, MD;  Location: [facility];  Service: Cardiovascular;  Laterality: N/A;   ENTEROSCOPY N/A 12/21/2022   Procedure: ENTEROSCOPY;  Surgeon: Rosalie Kitchens, MD;  Location: [facility] ENDOSCOPY;  Service: Gastroenterology;  Laterality: N/A;   ESOPHAGOGASTRODUODENOSCOPY (EGD) WITH PROPOFOL  N/A 12/02/2022   Procedure: ESOPHAGOGASTRODUODENOSCOPY (EGD) WITH PROPOFOL ;  Surgeon: Dianna Specking, MD;  Location: [facility] ENDOSCOPY;  Service: Gastroenterology;  Laterality: N/A;   ESOPHAGOGASTRODUODENOSCOPY (EGD) WITH PROPOFOL  N/A 12/18/2022   Procedure: ESOPHAGOGASTRODUODENOSCOPY (EGD) WITH PROPOFOL ;  Surgeon: Saintclair Jasper, MD;  Location: [facility] ENDOSCOPY;  Service: Gastroenterology;  Laterality: N/A;   GIVENS CAPSULE STUDY N/A 12/18/2022   Procedure: GIVENS CAPSULE STUDY;  Surgeon: Saintclair Jasper, MD;  Location: [facility] ENDOSCOPY;  Service: Gastroenterology;  Laterality: N/A;   HOT HEMOSTASIS N/A 12/21/2022   Procedure: HOT HEMOSTASIS (ARGON PLASMA COAGULATION/BICAP);  Surgeon: Rosalie Kitchens, MD;  Location: [facility] ENDOSCOPY;  Service: Gastroenterology;  Laterality: N/A;  Straight Fire Probe   KNEE ARTHROSCOPY W/ MENISCAL REPAIR Left 03/10/2012   LEFT HEART CATH AND CORONARY ANGIOGRAPHY N/A  11/16/2022   Procedure: LEFT HEART CATH AND CORONARY ANGIOGRAPHY;  Surgeon: Elmira Newman PARAS, MD;  Location: [facility];  Service: Cardiovascular;  Laterality: N/A;   LOOP RECORDER INSERTION N/A 01/05/2018   Procedure: LOOP RECORDER INSERTION;  Surgeon: Fernande Elspeth BROCKS, MD;  Location: [facility] INVASIVE CV LAB;  Service: Cardiovascular;  Laterality: N/A;   LUMBAR SPINE SURGERY  03/10/1989   PROSTATE BIOPSY N/A 12/21/2012   Procedure: BIOPSY TRANSRECTAL ULTRASONIC PROSTATE (TUBP);  Surgeon: Thomasine Oiler, MD;  Location: [facility];  Service: Urology;  Laterality: N/A;   PROSTATE BIOPSY  11/26/2011   benign   TEMPORARY PACEMAKER N/A 11/16/2022   Procedure: TEMPORARY PACEMAKER;  Surgeon: Elmira Newman PARAS, MD;  Location: [facility];  Service: Cardiovascular;  Laterality: N/A;   TONSILLECTOMY     WISDOM TOOTH EXTRACTION        Current Outpatient Medications:    acetaminophen  (TYLENOL ) 650 MG CR tablet, Take 1,300 mg by mouth every evening. Take 2 tablets by mouth every evening at 1830 for pain. Additional doses as needed if desired., Disp: , Rfl:    allopurinol  (ZYLOPRIM ) 300 MG tablet, Take 300 mg by mouth daily., Disp: , Rfl:    clopidogrel  (PLAVIX ) 75 MG tablet, Take 75 mg by mouth daily., Disp: , Rfl:    docusate sodium  (COLACE) 100 MG capsule, Take 2 capsules (200 mg total) by mouth daily as needed for mild constipation., Disp: 100 capsule, Rfl: 0   ferrous sulfate  325 (65 FE) MG tablet, Take 325 mg by mouth daily with breakfast., Disp: , Rfl:    Hyprom-Naphaz-Polysorb-Zn Sulf (CLEAR EYES COMPLETE OP), Place 1-2 drops into both eyes daily as needed (Dry, red, irritated eyes.)., Disp: , Rfl:    isosorbide  mononitrate (IMDUR ) 30 MG 24 hr tablet, TAKE 1 TABLET(30 MG) BY MOUTH DAILY, Disp: 90 tablet, Rfl: 3   losartan  (COZAAR ) 100 MG tablet, Take 1 tablet (100 mg total) by mouth daily., Disp: 30 tablet, Rfl: 6   Multiple Vitamins-Minerals (MULTIVITAMIN ADULT  EXTRA C PO), Take 1 tablet by mouth daily., Disp: , Rfl:    nitroGLYCERIN  (NITROSTAT ) 0.4 MG SL tablet, Place 1 tablet (0.4 mg total) under the tongue every 5 (five) minutes as needed for chest pain (up to 3 doses)., Disp: 25 tablet, Rfl: 1   pantoprazole  (PROTONIX ) 40 MG tablet, Take 1 tablet (40 mg total) by mouth 2 (two) times daily before a meal., Disp: 180 tablet, Rfl: 0   polyethylene glycol powder (GLYCOLAX /MIRALAX ) 17 GM/SCOOP powder, Take 1 capful (17 g) with water by mouth daily as needed for mild constipation., Disp: 238 g, Rfl: 0   REPATHA SURECLICK 140 MG/ML SOAJ, Inject 140 mg into the skin every 14 (fourteen) days. Inject on Monday, Disp: , Rfl:    tolnaftate  (TINACTIN) 1 % cream, Apply 1 application topically daily as needed (jock itch)., Disp: , Rfl:     Physical Exam: Blood pressure (!) 162/80, pulse 64, height 6' 3 (1.905 m), weight 223 lb (101.2 kg), SpO2 98%.  Repeat 142/78 mmHg by me  Affect appropriate Healthy:  appears stated age HEENT: normal Neck supple with no adenopathy JVP normal no bruits no thyromegaly Lungs clear with no wheezing and good diaphragmatic motion Heart:  S1/S2 no murmur, no rub, gallop or click PMI normal Abdomen: benighn, BS positve, no tenderness, no AAA no bruit.  No HSM or HJR Distal pulses intact with no bruits No edema Neuro non-focal Skin warm and dry Right hand weakness to hand grip Tophaceous Gout in left olecranon bursa   Labs:   Lab Results  Component Value Date   WBC 6.4 09/09/2023   HGB 9.7 (L) 09/09/2023   HCT 30.0 (L) 09/09/2023   MCV 91.7 09/09/2023   PLT 147 (L) 09/09/2023   No results for input(s): NA, K, CL, CO2, BUN, CREATININE, CALCIUM, PROT, BILITOT, ALKPHOS, ALT, AST, GLUCOSE in the last 168 hours.  Invalid input(s): LABALBU Lab Results  Component Value Date   CKTOTAL 58 09/08/2023    Lab Results  Component Value Date   CHOL 229 (H) 11/15/2022   CHOL 168 11/06/2017   Lab  Results  Component Value Date   HDL 51 11/15/2022   HDL  68 11/06/2017   Lab Results  Component Value Date   LDLCALC 117 (H) 11/15/2022   LDLCALC 67 11/06/2017   Lab Results  Component Value Date   TRIG 307 (H) 11/15/2022   TRIG 166 (H) 11/06/2017   Lab Results  Component Value Date   CHOLHDL 4.5 11/15/2022   CHOLHDL 2.5 11/06/2017   No results found for: LDLDIRECT    Radiology: US  RENAL Result Date: 10/21/2023 CLINICAL DATA:  Chronic kidney disease stage 4 EXAM: RENAL / URINARY TRACT ULTRASOUND COMPLETE COMPARISON:  None Available. FINDINGS: Right Kidney: Renal measurements: 10.9 x 6.1 x 6.5 cm = volume: 227 mL. Increased echogenicity compatible with medical renal disease. No hydronephrosis or acute finding. No focal abnormality. Left Kidney: Renal measurements: 10.6 x 6.1 x 6.1 cm = volume: 206 mL. Similar increased echogenicity compatible with medical renal disease. No hydronephrosis or focal abnormality. Bladder: Not visualized Other: Limited exam because of body habitus. IMPRESSION: Increased renal echogenicity compatible with medical renal disease. No acute finding by ultrasound. Electronically Signed   By: CHRISTELLA.  Shick M.D.   On: 10/21/2023 15:32    EKG: SR rate 73 LAD 06/26/23    ASSESSMENT AND PLAN:  Heart Failure with reduced EF  - EF normalized by TTE 02/23/23  - ischemic DCM with CTO RCA and stent to LAD 11/16/22 - BNP normal  - Losartan  and imdur  RX - runs bradycardic   - Euvolemic   2. CAD - CTO of the RCA - S/P PCI to the LAD - continue single antiplatelet with plavix  - chest pain has resolved with addition of imdur  30mg    3. 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 Received 3 units PRB'c -Update Hct  - on iron    4. CKD3a - Cr 1.7-2.0  -BMET    5. Prostate ca  - s/p XRT; bony mets in his spine - Xtandi    6. Hx of syncope - Loop recorder placed with no significant findings. Now explanted.     7.  Dyslipidemia -Currently on Repatha.  Reports compliance.   8. HTN  - Increase losartan  to 100mg  daily.  9. Preoperative:  ok to have hand surgery with Dr Germaine for carpal tunnel. Hold plavix  5 days before Resume ASAP post surgery    F/U in 6 months   Signed: Maude Emmer 11/11/2023, 8:53 AM

## 2023-11-11 NOTE — Patient Instructions (Signed)

## 2023-12-09 ENCOUNTER — Ambulatory Visit: Admitting: Cardiovascular Disease

## 2024-01-19 ENCOUNTER — Ambulatory Visit (HOSPITAL_COMMUNITY)
Admission: RE | Admit: 2024-01-19 | Discharge: 2024-01-19 | Disposition: A | Discharge status: Home / Self Care | Source: Ambulatory Visit | Attending: Cardiovascular Disease | Admitting: Cardiovascular Disease

## 2024-01-19 ENCOUNTER — Ambulatory Visit: Payer: Self-pay | Admitting: Cardiovascular Disease

## 2024-01-19 DIAGNOSIS — I6523 Occlusion and stenosis of bilateral carotid arteries: Secondary | ICD-10-CM | POA: Diagnosis present

## 2024-01-25 ENCOUNTER — Encounter: Payer: Self-pay | Admitting: Diagnostic Neuroimaging

## 2024-01-25 ENCOUNTER — Ambulatory Visit: Admitting: Diagnostic Neuroimaging

## 2024-01-25 VITALS — BP 180/88 | HR 61 | Ht 75.0 in | Wt 224.0 lb

## 2024-01-25 DIAGNOSIS — R413 Other amnesia: Secondary | ICD-10-CM | POA: Diagnosis not present

## 2024-01-25 NOTE — Progress Notes (Signed)
 [facility]  PATIENT: Ricky SCHLEICHER Sr. DOB: 1945/03/14  REFERRING CLINICIAN: Rexanne Ingle, MD  HISTORY FROM: patient and wife REASON FOR VISIT: new consult  HISTORICAL  CHIEF COMPLAINT:  Chief Complaint  Patient presents with   RM 7    Possible short term memory concerns; with wife    HISTORY OF PRESENT ILLNESS:   UPDATE (01/25/24, VRP): Since last visit, no more syncope events. Now noting some mild memory changes in the last year, noted by wife and patient. No changes in ADLs. Has hearing impairment, but not using hearing aids.   UPDATE (04/14/23, VRP): Since last visit, was doing well, until 02/22/23. Fell off stool and then briefly passed out (10-15 seconds). No prodrome symptoms. Has noted some weakness in legs. BP has been labile (high and low).   UPDATE (09/17/20, VRP): Since last visit, patient has stopped drinking alcohol as of May 08, 2020.  On June 04, 2020 patient was at home when all of a sudden he was staring and unresponsive at the kitchen table.  This lasted for about 15 to 20 seconds.  This was noticed by wife.  No convulsions.  Patient did not blackout or collapse.  He was sort of  frozen for moment. When he woke up he was not aware of the event.   UPDATE (12/28/17, VRP): Since last visit, here for recurrent attacks of lightheadedness and syncope. 11/05/17, had unprovoked syncope. Had prodrome lightheaded feeling, then fell to ground. Woke up in a few seconds, and was able to call his wife to help. He went to ER and was admitted. Workup negative. Now has stopped etoh use (previously 2-4 drinks per day consistently).   UPDATE 11/13/15: Since last visit, doing well. Saw cardiology, who referred pt to vestibular PT, who found orthostatic hypotension (SBP 156 --> 99). Now BP better controlled / regulated, and patient not having any more events.   PRIOR HPI (09/13/15): 78 year old male with hypertension, hypercholesteremia, prostate cancer, here for  evaluation of dizziness attacks. Symptoms started in 2014. Patient has 8 to 12 episodes per year. No specific triggering factors. Patient had one attack last week and another one the week before. Previously last attack was in November 2016. Patient describes a sensation of intestine, stomach upset sensation, overheating sensation, severe sweats, lightheadedness and nausea. Sometimes patient vomits. Patient feels off balance during these attacks. No unilateral numbness, tingling, slurred speech, double vision or headaches with the attacks. Patient typically tries to make it home, lay down and go to sleep. If patient vomits or has a bowel movement after the tachycardia started, seems to relieve the sensation and symptom. If patient is laying flat after an attack, if he tries to sit up symptoms are exacerbated. Patient has never measured his blood pressure during these attacks. Patient has got to the emergency room for several of these attacks while in the emergency room hypotension has not been documented. Patient denies any palpitations, chest pain, shortness of breath. Episodes of occurred in standing or sitting position. Episodes of occurred [place]. No specific triggering factors. In the past patient has had MRI of the brain, cardiac testing, heart monitor, nuclear medicine stress test, which appeared unremarkable. These testing were done 2-3 years ago.   REVIEW OF SYSTEMS: Full 14 system review of systems performed and negative except as per HPI.    ALLERGIES: Allergies  Allergen Reactions   Ezetimibe Other (See Comments)    Muscle aches  Statins Other (See Comments)    Joint pain    HOME MEDICATIONS: Outpatient Medications Prior to Visit  Medication Sig Dispense Refill   acetaminophen  (TYLENOL ) 650 MG CR tablet Take 1,300 mg by mouth every evening. Take 2 tablets by mouth every evening at 1830 for pain. Additional doses as needed if desired.     allopurinol   (ZYLOPRIM ) 300 MG tablet Take 300 mg by mouth daily.     clopidogrel  (PLAVIX ) 75 MG tablet Take 75 mg by mouth daily.     docusate sodium  (COLACE) 100 MG capsule Take 2 capsules (200 mg total) by mouth daily as needed for mild constipation. 100 capsule 0   ferrous sulfate  325 (65 FE) MG tablet Take 325 mg by mouth daily with breakfast.     Hyprom-Naphaz-Polysorb-Zn Sulf (CLEAR EYES COMPLETE OP) Place 1-2 drops into both eyes daily as needed (Dry, red, irritated eyes.).     isosorbide  mononitrate (IMDUR ) 30 MG 24 hr tablet TAKE 1 TABLET(30 MG) BY MOUTH DAILY 90 tablet 3   losartan  (COZAAR ) 100 MG tablet Take 1 tablet (100 mg total) by mouth daily. 30 tablet 6   Multiple Vitamins-Minerals (MULTIVITAMIN ADULT EXTRA C PO) Take 1 tablet by mouth daily.     nitroGLYCERIN  (NITROSTAT ) 0.4 MG SL tablet Place 1 tablet (0.4 mg total) under the tongue every 5 (five) minutes as needed for chest pain (up to 3 doses). 25 tablet 1   pantoprazole  (PROTONIX ) 40 MG tablet Take 1 tablet (40 mg total) by mouth 2 (two) times daily before a meal. 180 tablet 0   polyethylene glycol powder (GLYCOLAX /MIRALAX ) 17 GM/SCOOP powder Take 1 capful (17 g) with water by mouth daily as needed for mild constipation. 238 g 0   REPATHA SURECLICK 140 MG/ML SOAJ Inject 140 mg into the skin every 14 (fourteen) days. Inject on Monday     tolnaftate  (TINACTIN) 1 % cream Apply 1 application topically daily as needed (jock itch).     No facility-administered medications prior to visit.    PAST MEDICAL HISTORY: Past Medical History:  Diagnosis Date   Acute kidney injury superimposed on chronic kidney disease 11/19/2022   Borderline diabetes    DIET CONTROLLED   Bradycardia 11/17/2022   Dyslipidemia    Elevated PSA    External carotid artery stenosis    MILD-- BILATERAL   Fall 11/05/2017   GERD (gastroesophageal reflux disease)    Gout, arthritis    Hearing loss in left ear    Heart murmur    MILD --  ASYMPTOMATIC   History of  gastric ulcer    REMOTE HX   Hypertension    Pre-diabetes    Prostate cancer (HCC) 12/21/2012   Gleason 3+4=7   Seizure-like activity (HCC)    Stroke (HCC)    Syncope and collapse    Wears glasses     PAST SURGICAL HISTORY: Past Surgical History:  Procedure Laterality Date   CORONARY STENT INTERVENTION N/A 11/16/2022   Procedure: CORONARY STENT INTERVENTION;  Surgeon: Elmira Newman PARAS, MD;  Location: [facility];  Service: Cardiovascular;  Laterality: N/A;   CORONARY ULTRASOUND/IVUS N/A 11/16/2022   Procedure: Coronary Ultrasound/IVUS;  Surgeon: Elmira Newman PARAS, MD;  Location: [facility];  Service: Cardiovascular;  Laterality: N/A;   ENTEROSCOPY N/A 12/21/2022   Procedure: ENTEROSCOPY;  Surgeon: Rosalie Kitchens, MD;  Location: [facility] ENDOSCOPY;  Service: Gastroenterology;  Laterality: N/A;   ESOPHAGOGASTRODUODENOSCOPY (EGD) WITH PROPOFOL  N/A 12/02/2022   Procedure: ESOPHAGOGASTRODUODENOSCOPY (EGD) WITH PROPOFOL ;  Surgeon: Dianna Specking, MD;  Location: [facility] ENDOSCOPY;  Service: Gastroenterology;  Laterality: N/A;   ESOPHAGOGASTRODUODENOSCOPY (EGD) WITH PROPOFOL  N/A 12/18/2022   Procedure: ESOPHAGOGASTRODUODENOSCOPY (EGD) WITH PROPOFOL ;  Surgeon: Saintclair Jasper, MD;  Location: [facility] ENDOSCOPY;  Service: Gastroenterology;  Laterality: N/A;   GIVENS CAPSULE STUDY N/A 12/18/2022   Procedure: GIVENS CAPSULE STUDY;  Surgeon: Saintclair Jasper, MD;  Location: [facility] ENDOSCOPY;  Service: Gastroenterology;  Laterality: N/A;   HOT HEMOSTASIS N/A 12/21/2022   Procedure: HOT HEMOSTASIS (ARGON PLASMA COAGULATION/BICAP);  Surgeon: Rosalie Kitchens, MD;  Location: [facility] ENDOSCOPY;  Service: Gastroenterology;  Laterality: N/A;  Straight Fire Probe   KNEE ARTHROSCOPY W/ MENISCAL REPAIR Left 03/10/2012   LEFT HEART CATH AND CORONARY ANGIOGRAPHY N/A 11/16/2022   Procedure: LEFT HEART CATH AND CORONARY ANGIOGRAPHY;  Surgeon: Elmira Newman PARAS, MD;  Location: [facility];  Service: Cardiovascular;   Laterality: N/A;   LOOP RECORDER INSERTION N/A 01/05/2018   Procedure: LOOP RECORDER INSERTION;  Surgeon: Fernande Elspeth BROCKS, MD;  Location: [facility] INVASIVE CV LAB;  Service: Cardiovascular;  Laterality: N/A;   LUMBAR SPINE SURGERY  03/10/1989   PROSTATE BIOPSY N/A 12/21/2012   Procedure: BIOPSY TRANSRECTAL ULTRASONIC PROSTATE (TUBP);  Surgeon: Thomasine Oiler, MD;  Location: [facility];  Service: Urology;  Laterality: N/A;   PROSTATE BIOPSY  11/26/2011   benign   TEMPORARY PACEMAKER N/A 11/16/2022   Procedure: TEMPORARY PACEMAKER;  Surgeon: Elmira Newman PARAS, MD;  Location: [facility];  Service: Cardiovascular;  Laterality: N/A;   TONSILLECTOMY     WISDOM TOOTH EXTRACTION      FAMILY HISTORY: Family History  Problem Relation Age of Onset   Heart disease Mother    Heart disease Father    Cancer Sister        lung   Cancer Brother        prostate   Syncope episode Neg Hx     SOCIAL HISTORY:  Social History   Socioeconomic History   Marital status: Married    Spouse name: geraldine   Number of children: 3   Years of education: some college   Highest education level: High school graduate  Occupational History   Occupation: retired  Tobacco Use   Smoking status: Former    Current packs/day: 0.00    Average packs/day: 1 pack/day for 30.0 years (30.0 ttl pk-yrs)    Types: Cigarettes    Start date: 12/16/1967    Quit date: 12/15/1997    Years since quitting: 26.1   Smokeless tobacco: Never  Vaping Use   Vaping status: Never Used  Substance and Sexual Activity   Alcohol use: Not Currently    Alcohol/week: 20.0 standard drinks of alcohol    Types: 20 Shots of liquor per week    Comment: OCCASIONAL, 12/28/17 stopped 11/05/17   Drug use: No   Sexual activity: Not on file  Other Topics Concern   Not on file  Social History Narrative   Lives with wife at home    caffeine use- occas, mostly decaf coffee   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    Social Drivers of Health    Financial Resource Strain: Low Risk  (11/18/2022)   Overall Financial Resource Strain (CARDIA)    Difficulty of Paying Living Expenses: Not hard at all  Food Insecurity: No Food Insecurity (09/08/2023)   Hunger Vital Sign    Worried About Running Out of Food in the Last Year: Never true    Ran Out of Food in the Last  Year: Never true  Transportation Needs: No Transportation Needs (09/08/2023)   PRAPARE - Administrator, Civil Service (Medical): No    Lack of Transportation (Non-Medical): No  Physical Activity: Not on file  Stress: Not on file  Social Connections: Patient Declined (09/08/2023)   Social Connection and Isolation Panel    Frequency of Communication with Friends and Family: Patient declined    Frequency of Social Gatherings with Friends and Family: Patient declined    Attends Religious Services: Patient declined    Database Administrator or Organizations: Patient declined    Attends Banker Meetings: Patient declined    Marital Status: Patient declined  Intimate Partner Violence: Not At Risk (09/08/2023)   Humiliation, Afraid, Rape, and Kick questionnaire    Fear of Current or Ex-Partner: No    Emotionally Abused: No    Physically Abused: No    Sexually Abused: No     PHYSICAL EXAM  GENERAL EXAM/CONSTITUTIONAL: Vitals:  Vitals:   01/25/24 1124  BP: (!) 180/88  Pulse: 61  Weight: 224 lb (101.6 kg)  Height: 6' 3 (1.905 m)    No data found.    Body mass index is 28 kg/m. No results found. Patient is in no distress; well developed, nourished and groomed; neck is supple  CARDIOVASCULAR: Examination of carotid arteries is normal; no carotid bruits Regular rate and rhythm, no murmurs Examination of peripheral vascular system by observation and palpation is normal  EYES: Ophthalmoscopic exam of optic discs and posterior segments is normal; no papilledema or hemorrhages  MUSCULOSKELETAL: Gait, strength, tone, movements noted in  Neurologic exam below  NEUROLOGIC: MENTAL STATUS:      No data to display            01/25/2024   11:37 AM  Montreal Cognitive Assessment   Visuospatial/ Executive (0/5) 3  Naming (0/3) 1  Attention: Read list of digits (0/2) 2  Attention: Read list of letters (0/1) 1  Attention: Serial 7 subtraction starting at 100 (0/3) 1  Language: Repeat phrase (0/2) 1  Language : Fluency (0/1) 0  Abstraction (0/2) 2  Delayed Recall (0/5) 1  Orientation (0/6) 5  Total 17    awake, alert, oriented to person, place and time recent and remote memory intact normal attention and concentration language fluent, comprehension intact, naming intact,  fund of knowledge appropriate  CRANIAL NERVE:  2nd - no papilledema on fundoscopic exam 2nd, 3rd, 4th, 6th - pupils equal and reactive to light, visual fields full to confrontation, extraocular muscles intact, no nystagmus 5th - facial sensation symmetric 7th - facial strength symmetric 8th - hearing intact 9th - palate elevates symmetrically, uvula midline 11th - shoulder shrug symmetric 12th - tongue protrusion midline  MOTOR:  normal bulk and tone BUE 5; EXCEPT RIGHT DELTOID 4+  BLE --> HF 3, KE 4, KF 4, DF 5  SENSORY:  normal and symmetric to light touch, temperature, vibration  COORDINATION:  finger-nose-finger, fine finger movements normal  REFLEXES:  deep tendon reflexes present and symmetric  GAIT/STATION:  ANTALGIC GAIT    DIAGNOSTIC DATA (LABS, IMAGING, TESTING) - I reviewed patient records, labs, notes, testing and imaging myself where available.  Lab Results  Component Value Date   WBC 6.4 09/09/2023   HGB 9.7 (L) 09/09/2023   HCT 30.0 (L) 09/09/2023   MCV 91.7 09/09/2023   PLT 147 (L) 09/09/2023      Component Value Date/Time   NA 137 09/09/2023 0426  NA 140 06/18/2016 0857   K 4.1 09/09/2023 0426   K 4.2 06/18/2016 0857   CL 108 09/09/2023 0426   CO2 18 (L) 09/09/2023 0426   CO2 25 06/18/2016 0857    GLUCOSE 119 (H) 09/09/2023 0426   GLUCOSE 126 06/18/2016 0857   BUN 43 (H) 09/09/2023 0426   BUN 14.7 06/18/2016 0857   CREATININE 2.08 (H) 09/09/2023 0426   CREATININE 1.73 (H) 07/15/2022 0912   CREATININE 1.1 06/18/2016 0857   CALCIUM 9.1 09/09/2023 0426   CALCIUM 10.1 06/18/2016 0857   PROT 7.9 09/07/2023 2045   PROT 7.3 04/14/2023 1307   PROT 7.8 06/18/2016 0857   ALBUMIN 4.0 09/07/2023 2045   ALBUMIN 4.0 06/18/2016 0857   AST 20 09/07/2023 2045   AST 22 07/15/2022 0912   AST 43 (H) 06/18/2016 0857   ALT 14 09/07/2023 2045   ALT 19 07/15/2022 0912   ALT 41 06/18/2016 0857   ALKPHOS 87 09/07/2023 2045   ALKPHOS 79 06/18/2016 0857   BILITOT 0.5 09/07/2023 2045   BILITOT 0.4 07/15/2022 0912   BILITOT 0.73 06/18/2016 0857   GFRNONAA 32 (L) 09/09/2023 0426   GFRNONAA 40 (L) 07/15/2022 0912   GFRAA >60 07/11/2019 1031   Lab Results  Component Value Date   CHOL 229 (H) 11/15/2022   HDL 51 11/15/2022   LDLCALC 117 (H) 11/15/2022   TRIG 307 (H) 11/15/2022   CHOLHDL 4.5 11/15/2022   Lab Results  Component Value Date   HGBA1C 6.1 (H) 04/14/2023   Lab Results  Component Value Date   VITAMINB12 292 04/14/2023   Lab Results  Component Value Date   TSH 1.730 04/14/2023    01/10/13 nuclear stress test 1.  No fixed or reversible defects to suggest inducible ischemia. 2. Normal left ventricular wall motion and thickening. 3. Normal ejection fraction.  02/03/15 MRI brain (with and without) [I reviewed images myself and agree with interpretation. -VRP]  1. No acute intracranial abnormality or mass. 2. Chronic right basal ganglia lacunar infarcts. 3. Mild cerebral and cerebellar atrophy.  09/07/15 EKG [I reviewed images myself and agree with interpretation. -VRP]  - normal sinus rhythm  10/23/15 EEG  - normal  11/06/17 carotid u/s - Right Carotid: Velocities in the right ICA are consistent with a 1-39% stenosis. - Left Carotid: Velocities in the left ICA are  consistent with a 40-59% stenosis.  02/24/23 MRI brain  1. No acute intracranial abnormality. 2. Old right basal ganglia small vessel infarct and mild generalized volume loss.  02/24/23 EEG - This study is within normal limits. No seizures or epileptiform discharges were seen throughout the recording.   04/23/23 MRI lumbar spine 1. 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 No pathologic fracture. No extraosseous tumor extension. No malignant lumbar spinal stenosis. 2. No other metastatic disease identified. 3. Lumbar spine degeneration primarily at L2-L3 (mild multifactorial spinal, up to moderate right foraminal stenosis) and L5-S1 (chronic postoperative changes and developing interbody ankylosis, mild to moderate bilateral foraminal stenosis). 4. Large bowel diverticulosis.   ASSESSMENT AND PLAN  78 y.o. year old male here with intermittent unprovoked episodes since 2014 of lightheadedness, presyncope, nausea, diaphoresis. Findings suspicious for generalized hypotension / hypoperfusion events.    Dx:  1. Memory loss     PLAN:  MILD MEMORY IMPAIRMENT ([facility] 17/30; no changes in ADLs; possible MCI) - CHECK ATN AND B12 panel - use hearing aids if possible - try to  stay active physically and get some exercise (at least 15-30 minutes per day) - eat a nutritious diet with lean protein, plants / vegetables, whole grains; avoid ultra-processed foods - increase social activities, brain stimulation, games, puzzles, hobbies, crafts, arts, music; try new activities; keep it fun! - aim for at least 7-8 hours sleep per night (or more) - avoid smoking and alcohol - caution with medications, finances, driving - safety / supervision issues reviewed  RECURRENT PRESYNCOPE / SYNCOPE (last event 02/22/23) - chronic issue since ~2014; history of orthostatic hypotension; now complicated by symptomatic anemia, hematuria with recent  event in 2024 - do not suspect seizure at this time - check neuropathy labs (for autonomic dysfunction) - continue hydration - continue alcohol abstinence - monitor BP at home - follow up with PCP and cardiology; consider to reimplant loop recorder (no abnl spells were captured previously)  Remote right basal ganglia infarct (no stroke symptoms; likely related to chronic small vessel ischemic disease) - continue medical mgmt (plavix , repatha, BP control)  ABNORMAL SPELL / UNRESPONSIVE SPELL (06/04/20; also had stopped etoh completely on 05/08/20) - no recurrent spells  GAIT DIFF / LEG WEAKNESS - check neuropathy labs - check MRI lumbar spine  LEFT CAROTID STENOSIS (asymptomatic) - vascular risk factor mgmt - follow up with vascular sx clinic  Orders Placed This Encounter  Procedures   ATN PROFILE   Vitamin B12   Return for pending test results, pending if symptoms worsen or fail to improve.     EDUARD FABIENE HANLON, MD 01/25/2024, 12:04 PM Certified in Neurology, Neurophysiology and Neuroimaging  [facility] [address], [address] [place], [place] [address] [phone]

## 2024-01-25 NOTE — Patient Instructions (Addendum)
  MILD MEMORY IMPAIRMENT ([facility] 17/30; no changes in ADLs; possible MCI) - CHECK ATN AND B12 panel - use hearing aids if possible - try to stay active physically and get some exercise (at least 15-30 minutes per day) - eat a nutritious diet with lean protein, plants / vegetables, whole grains; avoid ultra-processed foods - increase social activities, brain stimulation, games, puzzles, hobbies, crafts, arts, music; try new activities; keep it fun! - aim for at least 7-8 hours sleep per night (or more) - avoid smoking and alcohol - caution with medications, finances, driving - safety / supervision issues reviewed

## 2024-01-28 LAB — ATN PROFILE
A -- Beta-amyloid 42/40 Ratio: 0.138 (ref >0.102)
Beta-amyloid 40: 252.24 pg/mL
Beta-amyloid 42: 34.84 pg/mL
N -- NfL, Plasma: 24.5 pg/mL — AB (ref 0.00–9.13)
T -- p-tau181: 1.7 pg/mL — AB (ref 0.00–0.97)

## 2024-01-28 LAB — VITAMIN B12: Vitamin B-12: 441 pg/mL (ref 232–1245)

## 2024-02-17 ENCOUNTER — Other Ambulatory Visit: Payer: Self-pay | Admitting: Nephrology

## 2024-02-17 ENCOUNTER — Ambulatory Visit
Admission: RE | Admit: 2024-02-17 | Discharge: 2024-02-17 | Disposition: A | Discharge status: Home / Self Care | Source: Ambulatory Visit | Attending: Nephrology | Admitting: Nephrology

## 2024-02-17 DIAGNOSIS — M25551 Pain in right hip: Secondary | ICD-10-CM

## 2024-04-05 ENCOUNTER — Ambulatory Visit: Payer: Self-pay | Admitting: Diagnostic Neuroimaging
